# Patient Record
Sex: Female | Born: 1975 | Hispanic: Yes | Marital: Married | Smoking: Former smoker
Health system: Southern US, Community
[De-identification: ages and names within clinical notes are randomized; demographics above are authoritative.]

## PROBLEM LIST (undated history)

## (undated) DIAGNOSIS — R1310 Dysphagia, unspecified: Secondary | ICD-10-CM

## (undated) DIAGNOSIS — R600 Localized edema: Secondary | ICD-10-CM

## (undated) DIAGNOSIS — F32A Depression, unspecified: Secondary | ICD-10-CM

## (undated) DIAGNOSIS — R609 Edema, unspecified: Secondary | ICD-10-CM

## (undated) DIAGNOSIS — M2550 Pain in unspecified joint: Secondary | ICD-10-CM

## (undated) DIAGNOSIS — R0600 Dyspnea, unspecified: Secondary | ICD-10-CM

## (undated) DIAGNOSIS — F4310 Post-traumatic stress disorder, unspecified: Secondary | ICD-10-CM

## (undated) DIAGNOSIS — R7303 Prediabetes: Secondary | ICD-10-CM

## (undated) DIAGNOSIS — M549 Dorsalgia, unspecified: Secondary | ICD-10-CM

## (undated) DIAGNOSIS — J45909 Unspecified asthma, uncomplicated: Secondary | ICD-10-CM

## (undated) DIAGNOSIS — K219 Gastro-esophageal reflux disease without esophagitis: Secondary | ICD-10-CM

## (undated) DIAGNOSIS — K5900 Constipation, unspecified: Secondary | ICD-10-CM

## (undated) DIAGNOSIS — F419 Anxiety disorder, unspecified: Secondary | ICD-10-CM

## (undated) DIAGNOSIS — C50919 Malignant neoplasm of unspecified site of unspecified female breast: Secondary | ICD-10-CM

## (undated) DIAGNOSIS — Z923 Personal history of irradiation: Secondary | ICD-10-CM

## (undated) DIAGNOSIS — E119 Type 2 diabetes mellitus without complications: Secondary | ICD-10-CM

## (undated) DIAGNOSIS — R0602 Shortness of breath: Secondary | ICD-10-CM

## (undated) HISTORY — DX: Shortness of breath: R06.02

## (undated) HISTORY — DX: Pain in unspecified joint: M25.50

## (undated) HISTORY — DX: Prediabetes: R73.03

## (undated) HISTORY — DX: Unspecified asthma, uncomplicated: J45.909

## (undated) HISTORY — DX: Dysphagia, unspecified: R13.10

## (undated) HISTORY — DX: Constipation, unspecified: K59.00

## (undated) HISTORY — DX: Anxiety disorder, unspecified: F41.9

## (undated) HISTORY — DX: Edema, unspecified: R60.9

## (undated) HISTORY — DX: Malignant neoplasm of unspecified site of unspecified female breast: C50.919

## (undated) HISTORY — DX: Dorsalgia, unspecified: M54.9

## (undated) HISTORY — DX: Morbid (severe) obesity due to excess calories: E66.01

## (undated) HISTORY — DX: Depression, unspecified: F32.A

## (undated) HISTORY — DX: Localized edema: R60.0

## (undated) HISTORY — DX: Personal history of irradiation: Z92.3

---

## 2015-04-24 ENCOUNTER — Other Ambulatory Visit (HOSPITAL_COMMUNITY)
Admission: RE | Admit: 2015-04-24 | Discharge: 2015-04-24 | Disposition: A | Discharge status: Home / Self Care | Payer: 59 | Source: Ambulatory Visit | Attending: Obstetrics & Gynecology | Admitting: Obstetrics & Gynecology

## 2015-04-24 DIAGNOSIS — Z1151 Encounter for screening for human papillomavirus (HPV): Secondary | ICD-10-CM | POA: Insufficient documentation

## 2015-04-24 DIAGNOSIS — Z01419 Encounter for gynecological examination (general) (routine) without abnormal findings: Secondary | ICD-10-CM | POA: Insufficient documentation

## 2015-11-10 DIAGNOSIS — R002 Palpitations: Secondary | ICD-10-CM

## 2015-11-10 HISTORY — DX: Palpitations: R00.2

## 2015-11-18 MED FILL — SYMBICORT 160-4.5 MCG INH: 160-4.5 | 30 days supply | Qty: 10 | Fill #3

## 2015-11-18 MED FILL — MONTELUKAST SOD 10 MG TAB: 10 | 30 days supply | Qty: 30 | Fill #5

## 2015-11-18 MED FILL — DULoxetine HCL 30 MG CPEP: 30 | 30 days supply | Qty: 90 | Fill #1

## 2015-11-29 DIAGNOSIS — F411 Generalized anxiety disorder: Secondary | ICD-10-CM | POA: Diagnosis not present

## 2015-12-03 DIAGNOSIS — R002 Palpitations: Secondary | ICD-10-CM | POA: Diagnosis not present

## 2015-12-10 MED FILL — busPIRone HCL 30 MG TABS: 30 | 30 days supply | Qty: 60 | Fill #0

## 2015-12-17 MED FILL — DULoxetine HCL 30 MG CPEP: 30 | 30 days supply | Qty: 90 | Fill #2

## 2015-12-25 ENCOUNTER — Ambulatory Visit: Payer: 59 | Admitting: Internal Medicine

## 2016-01-09 ENCOUNTER — Ambulatory Visit (INDEPENDENT_AMBULATORY_CARE_PROVIDER_SITE_OTHER): Payer: 59 | Admitting: Internal Medicine

## 2016-01-09 ENCOUNTER — Encounter: Payer: Self-pay | Admitting: Internal Medicine

## 2016-01-09 VITALS — BP 132/86 | HR 99 | Ht 59.5 in | Wt 207.7 lb

## 2016-01-09 DIAGNOSIS — R002 Palpitations: Secondary | ICD-10-CM | POA: Insufficient documentation

## 2016-01-09 DIAGNOSIS — R0602 Shortness of breath: Secondary | ICD-10-CM | POA: Diagnosis not present

## 2016-01-09 DIAGNOSIS — F419 Anxiety disorder, unspecified: Secondary | ICD-10-CM

## 2016-01-09 DIAGNOSIS — J45909 Unspecified asthma, uncomplicated: Secondary | ICD-10-CM | POA: Insufficient documentation

## 2016-01-09 DIAGNOSIS — R0683 Snoring: Secondary | ICD-10-CM

## 2016-01-09 DIAGNOSIS — J4530 Mild persistent asthma, uncomplicated: Secondary | ICD-10-CM

## 2016-01-09 NOTE — Patient Instructions (Signed)
Medication Instructions:  Your physician recommends that you continue on your current medications as directed. Please refer to the Current Medication list given to you today.   Labwork: None ordered  Testing/Procedures: Your physician has requested that you have an echocardiogram. Echocardiography is a painless test that uses sound waves to create images of your heart. It provides your doctor with information about the size and shape of your heart and how well your heart's chambers and valves are working. This procedure takes approximately one hour. There are no restrictions for this procedure.  Your physician has recommended that you have a sleep study. This test records several body functions during sleep, including: brain activity, eye movement, oxygen and carbon dioxide blood levels, heart rate and rhythm, breathing rate and rhythm, the flow of air through your mouth and nose, snoring, body muscle movements, and chest and belly movement.  Your physician has recommended that you wear an event monitor. Event monitors are medical devices that record the heart's electrical activity. Doctors most often [place] these monitors to diagnose arrhythmias. Arrhythmias are problems with the speed or rhythm of the heartbeat. The monitor is a small, portable device. You can wear one while you do your normal daily activities. This is usually used to diagnose what is causing palpitations/syncope (passing out).    Follow-Up: Your physician recommends that you schedule a follow-up appointment in 2-4 weeks   Any Other Special Instructions Will Be Listed Below (If Applicable).     If you need a refill on your cardiac medications before your next appointment, please call your pharmacy.

## 2016-01-09 NOTE — Progress Notes (Signed)
OFFICE NOTE  Chief Complaint:  Palpitations, anxiety, DOE, weight gain  Primary Care Physician: Wenda Low, MD  HPI:  Jennifer Davis is a pleasant 40 year old female who recently removed to [facility]. She is originally from the [place] area. She was living in [place] for a year and then moved to [place]. Her past history significant for asthma as well as ongoing anxiety. She has no known cardiac problems. She does have an aunt who has WPW but no one in the family with any significant heart history. 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 She presents for recent onset of palpitations and chest tightness. She's had palpitations in the distant past and had an EKG but has never worn a monitor. She also reports some chest tightness related to wheezing and her asthma but has had some aching in both arms as well as numbness and tingling in her fingers. Recently she's had rapid "weight gain". She also gets short of breath and some dizziness when she exerts her self. She told me that she was lifting a small piece of furniture with her husband up the stairs and became physically exhausted and short of breath. Her weight gain recently has pushed her up into the morbid obese category and a height of 4 foot 11 and weight of 207 pounds. EKG today is normal sinus rhythm without ischemic changes at 99.  PMHx:  Past Medical History  Diagnosis Date  . Asthma   . Anxiety     Past Surgical History  Procedure Laterality Date  . Cesarean section      x3    FAMHx:  Family History  Problem Relation Age of Onset  . Hypertension Mother   . Fibromyalgia Mother   . Asthma Sister   . Yves Dill Parkinson White syndrome Maternal Aunt   . Hypertension Maternal Grandmother   . Hypertension Maternal Grandfather   . Cancer - Lung Maternal Grandfather   . Stroke Maternal Grandfather     SOCHx:   reports that she quit  smoking about 4 years ago. Her smoking use included Cigarettes. She does not have any smokeless tobacco history on file. Her alcohol and drug histories are not on file.  ALLERGIES:  No Known Allergies  ROS: Pertinent items noted in HPI and remainder of comprehensive ROS otherwise negative.  HOME MEDS: Current Outpatient Prescriptions  Medication Sig Dispense Refill  . busPIRone (BUSPAR) 30 MG tablet Take 0.5 tablets by mouth 2 (two) times daily.  2  . DULoxetine (CYMBALTA) 30 MG capsule Take 90 mg by mouth daily.  2  . montelukast (SINGULAIR) 10 MG tablet Take 1 tablet by mouth daily.  5  . Multiple Vitamins-Minerals (MULTIVITAMIN PO) Take 2 tablets by mouth daily. Skin/hair/nail supplement.    Marland Kitchen PROAIR HFA 108 (90 Base) MCG/ACT inhaler Inhale 2 puffs into the lungs as needed.  0  . SYMBICORT 160-4.5 MCG/ACT inhaler Inhale 2 puffs into the lungs 2 (two) times daily.  5   No current facility-administered medications for this visit.    LABS/IMAGING: No results found for this or any previous visit (from the past 48 hour(s)). No results found.  WEIGHTS: Wt Readings from Last 3 Encounters:  01/09/16 207 lb 11.2 oz (94.212 kg)    VITALS: BP 132/86 mmHg  Pulse 99  Ht 4' 11.5" (1.511 m)  Wt 207 lb 11.2 oz (94.212 kg)  BMI 41.26 kg/m2  EXAM: General  appearance: alert, no distress and morbidly obese Neck: no carotid bruit and no JVD Lungs: clear to auscultation bilaterally Heart: regular rate and rhythm, S1, S2 normal, no murmur, click, rub or gallop Abdomen: soft, non-tender; bowel sounds normal; no masses,  no organomegaly Extremities: extremities normal, atraumatic, no cyanosis or edema Pulses: 2+ and symmetric Skin: Skin color, texture, turgor normal. No rashes or lesions Neurologic: Grossly normal Psych: Mildly anxious  EKG: Sinus rhythm at 99  ASSESSMENT: 1. Palpitations 2. Dyspnea on exertion 3. Recent significant weight gain 4. Anxiety 5. Asthma  PLAN: 1.    Mrs. Rumpf had recent weight gain and significant shortness of breath on exertion. I suspect the 2 are tightly related. She's also had palpitations which could be related to anxiety. She's been working with a psychiatrist to get her anxiety under control. She's never worn a monitor and I think would be helpful to see if any of these events are related to arrhythmias. We'll place a one-week monitor. We'll also get an echocardiogram as she's had worsening shortness of breath to rule out cardiomyopathy or significant valvular disorder. Plan to see her back to discuss those findings in a few weeks.  Thanks again for the kind referral.  Pixie Casino, MD, [facility] Attending Cardiologist [place] 01/09/2016, 6:09 PM

## 2016-01-10 ENCOUNTER — Encounter: Payer: Self-pay | Admitting: *Deleted

## 2016-01-15 MED FILL — MONTELUKAST SOD 10 MG TAB: 10 | 30 days supply | Qty: 30 | Fill #0

## 2016-01-21 MED FILL — busPIRone HCL 30 MG TABS: 30 | 30 days supply | Qty: 60 | Fill #1

## 2016-01-21 MED FILL — DULoxetine HCL 30 MG CPEP: 30 | 30 days supply | Qty: 90 | Fill #0

## 2016-01-27 ENCOUNTER — Other Ambulatory Visit: Payer: Self-pay

## 2016-01-27 ENCOUNTER — Other Ambulatory Visit (HOSPITAL_COMMUNITY): Payer: Self-pay | Admitting: *Deleted

## 2016-01-27 ENCOUNTER — Ambulatory Visit (INDEPENDENT_AMBULATORY_CARE_PROVIDER_SITE_OTHER): Payer: 59

## 2016-01-27 ENCOUNTER — Ambulatory Visit (HOSPITAL_COMMUNITY): Payer: 59 | Attending: Cardiovascular Disease

## 2016-01-27 DIAGNOSIS — R0600 Dyspnea, unspecified: Secondary | ICD-10-CM | POA: Diagnosis present

## 2016-01-27 DIAGNOSIS — I340 Nonrheumatic mitral (valve) insufficiency: Secondary | ICD-10-CM | POA: Diagnosis not present

## 2016-01-27 DIAGNOSIS — R0602 Shortness of breath: Secondary | ICD-10-CM

## 2016-01-27 DIAGNOSIS — I071 Rheumatic tricuspid insufficiency: Secondary | ICD-10-CM | POA: Insufficient documentation

## 2016-01-27 DIAGNOSIS — Z87891 Personal history of nicotine dependence: Secondary | ICD-10-CM | POA: Diagnosis not present

## 2016-01-27 DIAGNOSIS — R002 Palpitations: Secondary | ICD-10-CM | POA: Diagnosis not present

## 2016-01-27 DIAGNOSIS — Z6841 Body Mass Index (BMI) 40.0 and over, adult: Secondary | ICD-10-CM | POA: Insufficient documentation

## 2016-01-27 MED ORDER — PERFLUTREN LIPID MICROSPHERE
1.0000 mL | INTRAVENOUS | Status: AC | PRN
  Administered 2016-01-27: 1 mL via INTRAVENOUS

## 2016-01-30 MED FILL — SYMBICORT 160-4.5 MCG INH: 160-4.5 | 30 days supply | Qty: 10 | Fill #4

## 2016-01-30 MED FILL — PROAIR HFA 90 MCG INHALER: 108 (90 BAS | 16 days supply | Qty: 9 | Fill #0

## 2016-02-10 DIAGNOSIS — S60562A Insect bite (nonvenomous) of left hand, initial encounter: Secondary | ICD-10-CM | POA: Diagnosis not present

## 2016-02-18 ENCOUNTER — Encounter: Payer: Self-pay | Admitting: Internal Medicine

## 2016-02-18 ENCOUNTER — Ambulatory Visit (INDEPENDENT_AMBULATORY_CARE_PROVIDER_SITE_OTHER): Payer: 59 | Admitting: Internal Medicine

## 2016-02-18 VITALS — BP 124/60 | HR 93 | Ht 59.0 in | Wt 206.0 lb

## 2016-02-18 DIAGNOSIS — F419 Anxiety disorder, unspecified: Secondary | ICD-10-CM

## 2016-02-18 DIAGNOSIS — R002 Palpitations: Secondary | ICD-10-CM | POA: Diagnosis not present

## 2016-02-18 DIAGNOSIS — R0602 Shortness of breath: Secondary | ICD-10-CM

## 2016-02-18 NOTE — Progress Notes (Signed)
OFFICE NOTE  Chief Complaint:  Follow-up studies  Primary Care Physician: Wenda Low, MD  HPI:  Jennifer Davis is a pleasant 40 year old female who recently removed to [facility]. She is originally from the [place] area. She was living in [place] for a year and then moved to [place]. Her past history significant for asthma as well as ongoing anxiety. She has no known cardiac problems. She does have an aunt who has WPW but no one in the family with any significant heart history. 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 She presents for recent onset of palpitations and chest tightness. She's had palpitations in the distant past and had an EKG but has never worn a monitor. She also reports some chest tightness related to wheezing and her asthma but has had some aching in both arms as well as numbness and tingling in her fingers. Recently she's had rapid "weight gain". She also gets short of breath and some dizziness when she exerts her self. She told me that she was lifting a small piece of furniture with her husband up the stairs and became physically exhausted and short of breath. Her weight gain recently has pushed her up into the morbid obese category and a height of 4 foot 11 and weight of 207 pounds. EKG today is normal sinus rhythm without ischemic changes at 99.  Mrs. Petros returns today for follow-up. She reports her palpitations have improved somewhat with decreasing her caffeine intake. I had ordered a one-week monitor however she has been on the monitor for just short of one month. The monitor is only demonstrated sinus rhythm and sinus tachycardia. She also had an echocardiogram which is essentially normal demonstrated normal systolic and diastolic function and trivial MR and TR. This is very reassuring.  PMHx:  Past Medical History  Diagnosis Date  . Asthma   . Anxiety     Past Surgical  History  Procedure Laterality Date  . Cesarean section      x3    FAMHx:  Family History  Problem Relation Age of Onset  . Hypertension Mother   . Fibromyalgia Mother   . Asthma Sister     x2  . Yves Dill Parkinson White syndrome Maternal Aunt 67  . Hypertension Maternal Grandmother   . Hypertension Maternal Grandfather   . Cancer - Lung Maternal Grandfather   . Stroke Maternal Grandfather     SOCHx:   reports that she quit smoking about 4 years ago. Her smoking use included Cigarettes. She does not have any smokeless tobacco history on file. Her alcohol and drug histories are not on file.  ALLERGIES:  No Known Allergies  ROS: Pertinent items noted in HPI and remainder of comprehensive ROS otherwise negative.  HOME MEDS: Current Outpatient Prescriptions  Medication Sig Dispense Refill  . busPIRone (BUSPAR) 30 MG tablet Take 0.5 tablets by mouth 2 (two) times daily.  2  . DULoxetine (CYMBALTA) 30 MG capsule Take 90 mg by mouth daily.  2  . montelukast (SINGULAIR) 10 MG tablet Take 1 tablet by mouth daily.  5  . Multiple Vitamins-Minerals (MULTIVITAMIN PO) Take 2 tablets by mouth daily. Skin/hair/nail supplement.    Marland Kitchen PROAIR HFA 108 (90 Base) MCG/ACT inhaler Inhale 2 puffs into the lungs as needed.  0  . SYMBICORT 160-4.5 MCG/ACT inhaler Inhale 2 puffs into the lungs 2 (two) times daily.  5   No current  facility-administered medications for this visit.    LABS/IMAGING: No results found for this or any previous visit (from the past 48 hour(s)). No results found.  WEIGHTS: Wt Readings from Last 3 Encounters:  02/18/16 206 lb (93.441 kg)  01/09/16 207 lb 11.2 oz (94.212 kg)    VITALS: BP 124/60 mmHg  Pulse 93  Ht 4\' 11"  (1.499 m)  Wt 206 lb (93.441 kg)  BMI 41.58 kg/m2  EXAM: Deferred  EKG: Deferred  ASSESSMENT: 1. Palpitations - no evidence of extrasystoles or arrhythmias after 3 weeks of monitoring 2. Dyspnea on exertion - normal echo findings 3. Recent  significant weight gain 4. Anxiety 5. Asthma  PLAN: 1.   Mrs. Lecompte has had shortness of breath with recent weight gain which I suspect is due to deconditioning. Cardiovascularly, I cannot find any current issues. Her monitor failed to show any significant arrhythmias. We will discontinue that monitor today as it was only ordered for 1 week. Follow-up as needed.  Pixie Casino, MD, [facility] Attending Cardiologist [place] [facility] [place] 02/18/2016, 9:06 AM

## 2016-02-18 NOTE — Patient Instructions (Signed)
Your physician recommends that you schedule a follow-up appointment as needed  

## 2016-02-20 ENCOUNTER — Ambulatory Visit (HOSPITAL_BASED_OUTPATIENT_CLINIC_OR_DEPARTMENT_OTHER): Payer: 59 | Attending: Internal Medicine | Admitting: *Deleted

## 2016-02-20 DIAGNOSIS — G4733 Obstructive sleep apnea (adult) (pediatric): Secondary | ICD-10-CM | POA: Insufficient documentation

## 2016-02-20 DIAGNOSIS — Z79899 Other long term (current) drug therapy: Secondary | ICD-10-CM | POA: Insufficient documentation

## 2016-02-20 DIAGNOSIS — R0683 Snoring: Secondary | ICD-10-CM | POA: Insufficient documentation

## 2016-02-20 DIAGNOSIS — F329 Major depressive disorder, single episode, unspecified: Secondary | ICD-10-CM | POA: Diagnosis not present

## 2016-02-21 DIAGNOSIS — H5213 Myopia, bilateral: Secondary | ICD-10-CM | POA: Diagnosis not present

## 2016-02-25 MED FILL — DULoxetine HCL 30 MG CPEP: 30 | 30 days supply | Qty: 90 | Fill #1

## 2016-03-08 NOTE — Progress Notes (Signed)
Patient ID: Jennifer Davis, female   DOB: 02/21/1976, 40 y.o.   MRN: [id number]        Patient Name: Jennifer Davis, Jennifer Davis Date: 02/20/2016 Gender: Female D.O.B: Apr 20, 1976 Age (years): 39 Referring Provider: Nadean Corwin Hilty Height (inches): 60 Interpreting Physician: Shelva Majestic MD, ABSM Weight (lbs): 200 RPSGT: Gerhard Perches BMI: 39 MRN: [id number] Neck Size: 14.50  CLINICAL INFORMATION Sleep Study Type: NPSG   Indication for sleep study: OSA, Snoring   Epworth Sleepiness Score: 7   SLEEP STUDY TECHNIQUE As per the AASM Manual for the Scoring of Sleep and Associated Events v2.3 (April 2016) with a hypopnea requiring 4% desaturations. The channels recorded and monitored were frontal, central and occipital EEG, electrooculogram (EOG), submentalis EMG (chin), nasal and oral airflow, thoracic and abdominal wall motion, anterior tibialis EMG, snore microphone, electrocardiogram, and pulse oximetry.  MEDICATIONS  busPIRone (BUSPAR) 30 MG tablet 0.5 tablet, 2 times daily     Note: Received from: External Pharmacy Received Sig: (Written 01/09/2016 1431)   DULoxetine (CYMBALTA) 30 MG capsule 90 mg, Daily     Note: Received from: External Pharmacy Received Sig: (Written 01/09/2016 1431)   montelukast (SINGULAIR) 10 MG tablet 1 tablet, Daily     Note: Received from: External Pharmacy Received Sig: (Written 01/09/2016 1431)   Multiple Vitamins-Minerals (MULTIVITAMIN PO) 2 tablet, Daily     PROAIR HFA 108 (90 Base) MCG/ACT inhaler 2 puff, As needed     Note: Received from: External Pharmacy Received Sig: (Written 01/09/2016 1431)   SYMBICORT 160-4.5 MCG/ACT inhaler 2 puff, 2 times daily     Note: Received from: External Pharmacy     Medications self-administered by patient during sleep study : No sleep medicine administered.  SLEEP ARCHITECTURE The study was initiated at 10:24:24 PM and ended at 4:53:21 AM. Sleep onset time was 50.3 minutes and the sleep efficiency was 75.4%. The total sleep  time was 293.2 minutes. Wake after sleep onset (WASO) was 45.5 minutes Stage REM latency was N/A minutes. The patient spent 10.92% of the night in stage N1 sleep, 88.40% in stage N2 sleep, 0.68% in stage N3 and 0.00% in REM. Alpha intrusion was absent. Supine sleep was 72.14%.  RESPIRATORY PARAMETERS The overall apnea/hypopnea index (AHI) was 0.0 per hour. There were 0 total apneas, including 0 obstructive, 0 central and 0 mixed apneas. There were 0 hypopneas and 0 RERAs. The AHI during Stage REM sleep was N/A per hour. AHI while supine was 0.0 per hour. The mean oxygen saturation was 94.50%. The minimum SpO2 during sleep was 91.00%. Soft snoring was noted during this study.  CARDIAC DATA The 2 lead EKG demonstrated sinus rhythm. The mean heart rate was 80.31 beats per minute. Other EKG findings include: None.  LEG MOVEMENT DATA The total PLMS were 4 with a resulting PLMS index of 0.82. Associated arousal with leg movement index was 0.0 .  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 - No significant central sleep apnea occurred during this study (CAI = 0.0/h). - No significant oxygen desaturation  (Min O2 = 91.00%). - Reduced sleep efficiency. - Abnormal sleep architecture with reduction in slow wave sleep and absence of REM sleep. - The absence of REM sleep may be contributed by the patient's medicine for depression. - The arousal index was mildly abnormal. - Prolonged latency to sleep initiation. - Soft snoring volume. - No cardiac abnormalities were noted during this study. -  Clinically significant periodic limb movements did not occur during sleep. No significant associated arousals.  DIAGNOSIS - Snoring - Abnormal sleep architecture  RECOMMENDATIONS - At present there is no indication for CPAP therapy; however, sleep disordered breathing may be underestimated with the absence of REM  sleep on this study. - Avoid alcohol, sedatives and other CNS depressants that may worsen sleep apnea and disrupt normal sleep architecture. - Sleep hygiene should be reviewed to assess factors that may improve sleep quality. - Weight management (BMI 39) and regular exercise should be initiated.   Jennifer Sine, MD, [place], American Board of Sleep Medicine  ELECTRONICALLY SIGNED ON:  03/08/2016, 1:16 PM [place] PH: [phone]   FX: [phone] [place]

## 2016-03-09 DIAGNOSIS — H40003 Preglaucoma, unspecified, bilateral: Secondary | ICD-10-CM | POA: Diagnosis not present

## 2016-03-09 DIAGNOSIS — H3554 Dystrophies primarily involving the retinal pigment epithelium: Secondary | ICD-10-CM | POA: Diagnosis not present

## 2016-03-11 MED FILL — busPIRone HCL 30 MG TABS: 30 | 30 days supply | Qty: 60 | Fill #2

## 2016-03-23 MED FILL — DULoxetine HCL 30 MG CPEP: 30 | 30 days supply | Qty: 90 | Fill #2

## 2016-03-23 MED FILL — MONTELUKAST SOD 10 MG TAB: 10 | 30 days supply | Qty: 30 | Fill #1

## 2016-03-25 DIAGNOSIS — F411 Generalized anxiety disorder: Secondary | ICD-10-CM | POA: Diagnosis not present

## 2016-04-22 DIAGNOSIS — F411 Generalized anxiety disorder: Secondary | ICD-10-CM | POA: Diagnosis not present

## 2016-04-22 MED FILL — DULoxetine HCL 60 MG CPEP: 60 | 30 days supply | Qty: 60 | Fill #0

## 2016-04-22 MED FILL — busPIRone HCL 30 MG TABS: 30 | 30 days supply | Qty: 60 | Fill #0

## 2016-05-27 MED FILL — SYMBICORT 160-4.5 MCG INH: 160-4.5 | 30 days supply | Qty: 10 | Fill #0

## 2016-05-27 MED FILL — MONTELUKAST SOD 10 MG TAB: 10 | 30 days supply | Qty: 30 | Fill #2

## 2016-05-27 MED FILL — PROAIR HFA 90 MCG INHALER: 108 (90 BAS | 25 days supply | Qty: 9 | Fill #0

## 2016-05-27 MED FILL — busPIRone HCL 30 MG TABS: 30 | 30 days supply | Qty: 60 | Fill #1

## 2016-05-27 MED FILL — DULoxetine HCL 60 MG CPEP: 60 | 30 days supply | Qty: 60 | Fill #1

## 2016-07-01 MED FILL — DULoxetine HCL 60 MG CPEP: 60 | 30 days supply | Qty: 60 | Fill #0

## 2016-07-15 DIAGNOSIS — F411 Generalized anxiety disorder: Secondary | ICD-10-CM | POA: Diagnosis not present

## 2016-08-04 MED FILL — MONTELUKAST SOD 10 MG TAB: 10 | 30 days supply | Qty: 30 | Fill #3

## 2016-08-04 MED FILL — DULoxetine HCL 60 MG CPEP: 60 | 30 days supply | Qty: 60 | Fill #1

## 2016-08-26 DIAGNOSIS — F411 Generalized anxiety disorder: Secondary | ICD-10-CM | POA: Diagnosis not present

## 2016-09-07 MED FILL — MONTELUKAST SOD 10 MG TAB: 10 | 30 days supply | Qty: 30 | Fill #0

## 2016-09-07 MED FILL — SYMBICORT 160-4.5 MCG INH: 160-4.5 | 30 days supply | Qty: 10 | Fill #0

## 2016-09-07 MED FILL — DULoxetine HCL 60 MG CPEP: 60 | 30 days supply | Qty: 60 | Fill #2

## 2016-09-22 MED FILL — busPIRone HCL 30 MG TABS: 30 | 30 days supply | Qty: 60 | Fill #0

## 2016-10-07 DIAGNOSIS — F411 Generalized anxiety disorder: Secondary | ICD-10-CM | POA: Diagnosis not present

## 2016-10-14 MED FILL — DULoxetine HCL 60 MG CPEP: 60 | 30 days supply | Qty: 60 | Fill #0

## 2016-10-22 DIAGNOSIS — J3089 Other allergic rhinitis: Secondary | ICD-10-CM | POA: Diagnosis not present

## 2016-10-22 DIAGNOSIS — J3081 Allergic rhinitis due to animal (cat) (dog) hair and dander: Secondary | ICD-10-CM | POA: Diagnosis not present

## 2016-10-22 DIAGNOSIS — J4540 Moderate persistent asthma, uncomplicated: Secondary | ICD-10-CM | POA: Diagnosis not present

## 2016-10-22 DIAGNOSIS — J301 Allergic rhinitis due to pollen: Secondary | ICD-10-CM | POA: Diagnosis not present

## 2016-10-27 MED FILL — PROAIR HFA 90 MCG INHALER: 108 (90 BAS | 25 days supply | Qty: 9 | Fill #0

## 2016-11-17 MED FILL — DULoxetine HCL 60 MG CPEP: 60 | 30 days supply | Qty: 60 | Fill #1

## 2016-11-17 MED FILL — busPIRone HCL 30 MG TABS: 30 | 30 days supply | Qty: 60 | Fill #1

## 2016-11-17 MED FILL — MONTELUKAST SOD 10 MG TAB: 10 | 30 days supply | Qty: 30 | Fill #1

## 2016-12-07 DIAGNOSIS — F411 Generalized anxiety disorder: Secondary | ICD-10-CM | POA: Diagnosis not present

## 2016-12-28 MED FILL — DULoxetine HCL 60 MG CPEP: 60 | 30 days supply | Qty: 60 | Fill #0

## 2017-01-20 MED FILL — SYMBICORT 160-4.5 MCG INH: 160-4.5 | 30 days supply | Qty: 10 | Fill #1

## 2017-01-20 MED FILL — busPIRone HCL 30 MG TABS: 30 | 30 days supply | Qty: 60 | Fill #0

## 2017-01-21 MED FILL — MONTELUKAST SOD 10 MG TAB: 10 | 30 days supply | Qty: 30 | Fill #0

## 2017-01-27 MED FILL — DULoxetine HCL 60 MG CPEP: 60 | 30 days supply | Qty: 60 | Fill #1

## 2017-02-12 DIAGNOSIS — S0922XA Traumatic rupture of left ear drum, initial encounter: Secondary | ICD-10-CM | POA: Diagnosis not present

## 2017-02-18 MED FILL — PROAIR HFA 90 MCG INHALER: 108 (90 BAS | 25 days supply | Qty: 9 | Fill #0

## 2017-02-26 DIAGNOSIS — H5213 Myopia, bilateral: Secondary | ICD-10-CM | POA: Diagnosis not present

## 2017-03-01 DIAGNOSIS — F411 Generalized anxiety disorder: Secondary | ICD-10-CM | POA: Diagnosis not present

## 2017-03-09 MED FILL — MONTELUKAST SOD 10 MG TAB: 10 | 30 days supply | Qty: 30 | Fill #1

## 2017-03-09 MED FILL — DULoxetine HCL 60 MG CPEP: 60 | 30 days supply | Qty: 60 | Fill #2

## 2017-03-11 DIAGNOSIS — L659 Nonscarring hair loss, unspecified: Secondary | ICD-10-CM | POA: Diagnosis not present

## 2017-03-11 DIAGNOSIS — F419 Anxiety disorder, unspecified: Secondary | ICD-10-CM | POA: Diagnosis not present

## 2017-04-01 MED FILL — busPIRone HCL 30 MG TABS: 30 | 30 days supply | Qty: 60 | Fill #1

## 2017-04-01 MED FILL — SYMBICORT 160-4.5 MCG INH: 160-4.5 | 30 days supply | Qty: 10 | Fill #2

## 2017-04-16 MED FILL — DULoxetine HCL 60 MG CPEP: 60 | 30 days supply | Qty: 60 | Fill #0

## 2017-05-03 DIAGNOSIS — F411 Generalized anxiety disorder: Secondary | ICD-10-CM | POA: Diagnosis not present

## 2017-05-19 DIAGNOSIS — L659 Nonscarring hair loss, unspecified: Secondary | ICD-10-CM | POA: Diagnosis not present

## 2017-05-19 MED FILL — PROAIR HFA 90 MCG INHALER: 108 (90 BAS | 20 days supply | Qty: 9 | Fill #0

## 2017-05-20 MED FILL — DULoxetine HCL 60 MG CPEP: 60 | 30 days supply | Qty: 60 | Fill #1

## 2017-05-20 MED FILL — MONTELUKAST SOD 10 MG TAB: 10 | 30 days supply | Qty: 30 | Fill #2

## 2017-05-20 MED FILL — busPIRone HCL 30 MG TABS: 30 | 30 days supply | Qty: 60 | Fill #2

## 2017-06-09 DIAGNOSIS — N946 Dysmenorrhea, unspecified: Secondary | ICD-10-CM | POA: Diagnosis not present

## 2017-06-09 DIAGNOSIS — Z6841 Body Mass Index (BMI) 40.0 and over, adult: Secondary | ICD-10-CM | POA: Diagnosis not present

## 2017-06-09 DIAGNOSIS — Z01419 Encounter for gynecological examination (general) (routine) without abnormal findings: Secondary | ICD-10-CM | POA: Diagnosis not present

## 2017-06-10 DIAGNOSIS — Z6841 Body Mass Index (BMI) 40.0 and over, adult: Secondary | ICD-10-CM | POA: Diagnosis not present

## 2017-06-10 DIAGNOSIS — E7800 Pure hypercholesterolemia, unspecified: Secondary | ICD-10-CM | POA: Diagnosis not present

## 2017-06-10 DIAGNOSIS — J309 Allergic rhinitis, unspecified: Secondary | ICD-10-CM | POA: Diagnosis not present

## 2017-06-10 DIAGNOSIS — J45909 Unspecified asthma, uncomplicated: Secondary | ICD-10-CM | POA: Diagnosis not present

## 2017-06-10 DIAGNOSIS — F411 Generalized anxiety disorder: Secondary | ICD-10-CM | POA: Diagnosis not present

## 2017-06-10 DIAGNOSIS — Z1389 Encounter for screening for other disorder: Secondary | ICD-10-CM | POA: Diagnosis not present

## 2017-06-10 DIAGNOSIS — Z0000 Encounter for general adult medical examination without abnormal findings: Secondary | ICD-10-CM | POA: Diagnosis not present

## 2017-07-08 MED FILL — DULoxetine HCL 60 MG CPEP: 60 | 30 days supply | Qty: 60 | Fill #2

## 2017-07-20 MED FILL — SYMBICORT 160-4.5 MCG INH: 160-4.5 | 30 days supply | Qty: 10 | Fill #3

## 2017-07-26 DIAGNOSIS — F411 Generalized anxiety disorder: Secondary | ICD-10-CM | POA: Diagnosis not present

## 2017-08-16 MED FILL — MONTELUKAST SOD 10 MG TAB: 10 | 30 days supply | Qty: 30 | Fill #3

## 2017-08-16 MED FILL — DULoxetine HCL 60 MG CPEP: 60 | 30 days supply | Qty: 60 | Fill #3

## 2017-08-17 MED FILL — busPIRone HCL 30 MG TABS: 30 | 30 days supply | Qty: 60 | Fill #0

## 2017-09-06 MED FILL — PROAIR HFA 90 MCG INHALER: 108 (90 BAS | 17 days supply | Qty: 9 | Fill #0

## 2017-09-16 DIAGNOSIS — J45909 Unspecified asthma, uncomplicated: Secondary | ICD-10-CM | POA: Diagnosis not present

## 2017-09-16 DIAGNOSIS — J069 Acute upper respiratory infection, unspecified: Secondary | ICD-10-CM | POA: Diagnosis not present

## 2017-09-16 MED FILL — AZITHROMYCIN 250 MG TABLET: 250 | 5 days supply | Qty: 6 | Fill #0

## 2017-09-16 MED FILL — predniSONE 20 MG TABS: 20 | 5 days supply | Qty: 10 | Fill #0

## 2017-09-16 MED FILL — ALBUTEROL 0.083% INHAL SOLN: (2.5 MG/3ML | 8 days supply | Qty: 75 | Fill #0

## 2017-09-28 MED FILL — DULoxetine HCL 60 MG CPEP: 60 | 30 days supply | Qty: 60 | Fill #0

## 2017-10-14 MED FILL — SYMBICORT 160-4.5 MCG INH: 160-4.5 | 30 days supply | Qty: 10 | Fill #0

## 2017-10-21 DIAGNOSIS — J4540 Moderate persistent asthma, uncomplicated: Secondary | ICD-10-CM | POA: Diagnosis not present

## 2017-10-21 DIAGNOSIS — J3081 Allergic rhinitis due to animal (cat) (dog) hair and dander: Secondary | ICD-10-CM | POA: Diagnosis not present

## 2017-10-21 DIAGNOSIS — J3089 Other allergic rhinitis: Secondary | ICD-10-CM | POA: Diagnosis not present

## 2017-10-21 DIAGNOSIS — J301 Allergic rhinitis due to pollen: Secondary | ICD-10-CM | POA: Diagnosis not present

## 2017-10-21 MED FILL — busPIRone HCL 30 MG TABS: 30 | 30 days supply | Qty: 60 | Fill #0

## 2017-10-22 MED FILL — DULoxetine HCL 60 MG CPEP: 60 | 30 days supply | Qty: 60 | Fill #0

## 2017-10-26 MED FILL — MONTELUKAST SOD 10 MG TAB: 10 | 30 days supply | Qty: 30 | Fill #4

## 2017-11-17 DIAGNOSIS — F411 Generalized anxiety disorder: Secondary | ICD-10-CM | POA: Diagnosis not present

## 2017-12-13 MED FILL — MONTELUKAST SOD 10 MG TAB: 10 | 30 days supply | Qty: 30 | Fill #5

## 2017-12-13 MED FILL — DULoxetine HCL 60 MG CPEP: 60 | 30 days supply | Qty: 60 | Fill #1

## 2017-12-14 DIAGNOSIS — M549 Dorsalgia, unspecified: Secondary | ICD-10-CM | POA: Diagnosis not present

## 2017-12-14 MED FILL — CYCLOBENZAPRINE 10 MG TAB: 10 | 10 days supply | Qty: 15 | Fill #0

## 2017-12-14 MED FILL — IBUPROFEN 600 MG TABLET: 600 | 10 days supply | Qty: 30 | Fill #0

## 2017-12-29 DIAGNOSIS — F411 Generalized anxiety disorder: Secondary | ICD-10-CM | POA: Diagnosis not present

## 2018-01-03 MED FILL — busPIRone HCL 30 MG TABS: 30 | 30 days supply | Qty: 60 | Fill #1

## 2018-01-24 MED FILL — MONTELUKAST SOD 10 MG TAB: 10 | 30 days supply | Qty: 30 | Fill #0

## 2018-01-25 MED FILL — DULoxetine HCL 60 MG CPEP: 60 | 30 days supply | Qty: 60 | Fill #2

## 2018-01-26 DIAGNOSIS — F411 Generalized anxiety disorder: Secondary | ICD-10-CM | POA: Diagnosis not present

## 2018-01-26 MED FILL — PROAIR HFA 90 MCG INHALER: 108 (90 BAS | 17 days supply | Qty: 9 | Fill #0

## 2018-01-26 MED FILL — GABAPENTIN 100 MG CAP: 100 | 30 days supply | Qty: 90 | Fill #0

## 2018-02-01 MED FILL — SYMBICORT 160-4.5 MCG INH: 160-4.5 | 30 days supply | Qty: 10 | Fill #0

## 2018-02-16 MED FILL — busPIRone HCL 30 MG TABS: 30 | 30 days supply | Qty: 60 | Fill #2

## 2018-02-28 MED FILL — DULoxetine HCL 60 MG CPEP: 60 | 30 days supply | Qty: 60 | Fill #0

## 2018-02-28 MED FILL — MONTELUKAST SOD 10 MG TAB: 10 | 30 days supply | Qty: 30 | Fill #1

## 2018-03-02 DIAGNOSIS — F411 Generalized anxiety disorder: Secondary | ICD-10-CM | POA: Diagnosis not present

## 2018-03-04 DIAGNOSIS — H5213 Myopia, bilateral: Secondary | ICD-10-CM | POA: Diagnosis not present

## 2018-03-08 MED FILL — IBUPROFEN 600 MG TABLET: 600 | 10 days supply | Qty: 30 | Fill #1

## 2018-03-08 MED FILL — CYCLOBENZAPRINE 10 MG TAB: 10 | 10 days supply | Qty: 15 | Fill #1

## 2018-03-28 DIAGNOSIS — J45909 Unspecified asthma, uncomplicated: Secondary | ICD-10-CM | POA: Diagnosis not present

## 2018-03-28 DIAGNOSIS — J069 Acute upper respiratory infection, unspecified: Secondary | ICD-10-CM | POA: Diagnosis not present

## 2018-03-28 MED FILL — predniSONE 10 MG TABS: 10 | 6 days supply | Qty: 21 | Fill #0

## 2018-03-28 MED FILL — busPIRone HCL 30 MG TABS: 30 | 30 days supply | Qty: 60 | Fill #0

## 2018-03-28 MED FILL — GABAPENTIN 100 MG CAP: 100 | 30 days supply | Qty: 90 | Fill #1

## 2018-03-28 MED FILL — DULoxetine HCL 60 MG CPEP: 60 | 30 days supply | Qty: 60 | Fill #1

## 2018-03-28 MED FILL — AZITHROMYCIN 250 MG TABLET: 250 | 5 days supply | Qty: 6 | Fill #0

## 2018-04-06 MED FILL — SYMBICORT 160-4.5 MCG INH: 160-4.5 | 30 days supply | Qty: 10 | Fill #1

## 2018-04-22 MED FILL — MONTELUKAST SOD 10 MG TAB: 10 | 30 days supply | Qty: 30 | Fill #2

## 2018-05-04 MED FILL — busPIRone HCL 30 MG TABS: 30 | 30 days supply | Qty: 60 | Fill #1

## 2018-05-04 MED FILL — DULoxetine HCL 60 MG CPEP: 60 | 30 days supply | Qty: 60 | Fill #0

## 2018-05-30 DIAGNOSIS — F411 Generalized anxiety disorder: Secondary | ICD-10-CM | POA: Diagnosis not present

## 2018-05-31 MED FILL — DULoxetine HCL 60 MG CPEP: 60 | 30 days supply | Qty: 60 | Fill #1

## 2018-05-31 MED FILL — SYMBICORT 160-4.5 MCG INH: 160-4.5 | 30 days supply | Qty: 10 | Fill #2

## 2018-05-31 MED FILL — MONTELUKAST SOD 10 MG TAB: 10 | 30 days supply | Qty: 30 | Fill #3

## 2018-06-10 DIAGNOSIS — N951 Menopausal and female climacteric states: Secondary | ICD-10-CM | POA: Diagnosis not present

## 2018-06-10 DIAGNOSIS — Z6841 Body Mass Index (BMI) 40.0 and over, adult: Secondary | ICD-10-CM | POA: Diagnosis not present

## 2018-06-10 DIAGNOSIS — Z01419 Encounter for gynecological examination (general) (routine) without abnormal findings: Secondary | ICD-10-CM | POA: Diagnosis not present

## 2018-06-16 ENCOUNTER — Other Ambulatory Visit: Payer: Self-pay | Admitting: Obstetrics & Gynecology

## 2018-06-16 DIAGNOSIS — Z1231 Encounter for screening mammogram for malignant neoplasm of breast: Secondary | ICD-10-CM

## 2018-06-16 MED FILL — GABAPENTIN 100 MG CAP: 100 | 30 days supply | Qty: 60 | Fill #0

## 2018-06-16 MED FILL — busPIRone HCL 30 MG TABS: 30 | 30 days supply | Qty: 60 | Fill #2

## 2018-07-04 MED FILL — DULoxetine HCL 60 MG CPEP: 60 | 30 days supply | Qty: 60 | Fill #2

## 2018-07-13 ENCOUNTER — Ambulatory Visit
Admission: RE | Admit: 2018-07-13 | Discharge: 2018-07-13 | Disposition: A | Discharge status: Home / Self Care | Payer: 59 | Source: Ambulatory Visit | Attending: Obstetrics & Gynecology | Admitting: Obstetrics & Gynecology

## 2018-07-13 DIAGNOSIS — Z1231 Encounter for screening mammogram for malignant neoplasm of breast: Secondary | ICD-10-CM | POA: Diagnosis not present

## 2018-07-13 DIAGNOSIS — F411 Generalized anxiety disorder: Secondary | ICD-10-CM | POA: Diagnosis not present

## 2018-07-13 DIAGNOSIS — Z0000 Encounter for general adult medical examination without abnormal findings: Secondary | ICD-10-CM | POA: Diagnosis not present

## 2018-07-13 DIAGNOSIS — J45909 Unspecified asthma, uncomplicated: Secondary | ICD-10-CM | POA: Diagnosis not present

## 2018-07-13 DIAGNOSIS — J309 Allergic rhinitis, unspecified: Secondary | ICD-10-CM | POA: Diagnosis not present

## 2018-07-13 DIAGNOSIS — Z1389 Encounter for screening for other disorder: Secondary | ICD-10-CM | POA: Diagnosis not present

## 2018-07-13 DIAGNOSIS — E559 Vitamin D deficiency, unspecified: Secondary | ICD-10-CM | POA: Diagnosis not present

## 2018-07-15 ENCOUNTER — Other Ambulatory Visit: Payer: Self-pay | Admitting: Obstetrics & Gynecology

## 2018-07-15 DIAGNOSIS — R928 Other abnormal and inconclusive findings on diagnostic imaging of breast: Secondary | ICD-10-CM

## 2018-07-20 ENCOUNTER — Ambulatory Visit
Admission: RE | Admit: 2018-07-20 | Discharge: 2018-07-20 | Disposition: A | Discharge status: Home / Self Care | Payer: 59 | Source: Ambulatory Visit | Attending: Obstetrics & Gynecology | Admitting: Obstetrics & Gynecology

## 2018-07-20 ENCOUNTER — Other Ambulatory Visit: Payer: Self-pay | Admitting: Obstetrics & Gynecology

## 2018-07-20 DIAGNOSIS — R928 Other abnormal and inconclusive findings on diagnostic imaging of breast: Secondary | ICD-10-CM

## 2018-07-20 DIAGNOSIS — N6320 Unspecified lump in the left breast, unspecified quadrant: Principal | ICD-10-CM

## 2018-07-20 DIAGNOSIS — R922 Inconclusive mammogram: Secondary | ICD-10-CM | POA: Diagnosis not present

## 2018-07-20 DIAGNOSIS — N6310 Unspecified lump in the right breast, unspecified quadrant: Secondary | ICD-10-CM

## 2018-07-20 DIAGNOSIS — N6489 Other specified disorders of breast: Secondary | ICD-10-CM | POA: Diagnosis not present

## 2018-07-25 MED FILL — GABAPENTIN 100 MG CAP: 100 | 30 days supply | Qty: 60 | Fill #1

## 2018-07-25 MED FILL — SYMBICORT 160-4.5 MCG INH: 160-4.5 | 30 days supply | Qty: 10 | Fill #3

## 2018-07-25 MED FILL — MONTELUKAST SOD 10 MG TAB: 10 | 30 days supply | Qty: 30 | Fill #4

## 2018-07-25 MED FILL — busPIRone HCL 30 MG TABS: 30 | 30 days supply | Qty: 60 | Fill #0

## 2018-08-02 MED FILL — DULoxetine HCL 60 MG CPEP: 60 | 30 days supply | Qty: 60 | Fill #0

## 2018-08-06 ENCOUNTER — Encounter: Payer: Self-pay | Admitting: Behavioral Health

## 2018-08-06 DIAGNOSIS — F329 Major depressive disorder, single episode, unspecified: Secondary | ICD-10-CM | POA: Insufficient documentation

## 2018-08-06 DIAGNOSIS — F32A Depression, unspecified: Secondary | ICD-10-CM | POA: Insufficient documentation

## 2018-08-06 DIAGNOSIS — F4310 Post-traumatic stress disorder, unspecified: Secondary | ICD-10-CM

## 2018-08-15 DIAGNOSIS — R799 Abnormal finding of blood chemistry, unspecified: Secondary | ICD-10-CM | POA: Diagnosis not present

## 2018-08-23 ENCOUNTER — Ambulatory Visit: Payer: Self-pay | Admitting: Psychiatry

## 2018-09-06 MED FILL — DULoxetine HCL 60 MG CPEP: 60 | 30 days supply | Qty: 60 | Fill #1

## 2018-09-06 MED FILL — busPIRone HCL 30 MG TABS: 30 | 30 days supply | Qty: 60 | Fill #1

## 2018-09-06 MED FILL — GABAPENTIN 100 MG CAP: 100 | 30 days supply | Qty: 60 | Fill #2

## 2018-09-08 ENCOUNTER — Ambulatory Visit: Payer: Self-pay | Admitting: Psychiatry

## 2018-09-08 ENCOUNTER — Ambulatory Visit: Payer: 59 | Admitting: Psychiatry

## 2018-09-08 DIAGNOSIS — F329 Major depressive disorder, single episode, unspecified: Secondary | ICD-10-CM | POA: Diagnosis not present

## 2018-09-08 DIAGNOSIS — F4310 Post-traumatic stress disorder, unspecified: Secondary | ICD-10-CM

## 2018-09-08 DIAGNOSIS — F419 Anxiety disorder, unspecified: Secondary | ICD-10-CM | POA: Diagnosis not present

## 2018-09-08 DIAGNOSIS — F32A Depression, unspecified: Secondary | ICD-10-CM

## 2018-09-08 MED ORDER — DULOXETINE HCL 60 MG PO CPEP: ORAL_CAPSULE | ORAL | 2 refills | Status: DC

## 2018-09-08 MED ORDER — GABAPENTIN 100 MG PO CAPS: 100.0000 mg | ORAL_CAPSULE | Freq: Two times a day (BID) | ORAL | 2 refills | Status: DC

## 2018-09-08 MED ORDER — BUSPIRONE HCL 30 MG PO TABS: 30.0000 mg | ORAL_TABLET | Freq: Two times a day (BID) | ORAL | 2 refills | Status: DC

## 2018-09-08 NOTE — Progress Notes (Addendum)
Has had sleep study in past. Discuss ;next visit     [facility]  Patient ID: Jennifer Davis,  MRN: [id number]  PCP: Wenda Low, MD  Date of Evaluation: 09/08/2018 Time spent:20 minutes  Chief Complaint:   HISTORY/CURRENT STATUS: HPI patient is a 43 year old white female last visit 05/30/2018.  Carries a diagnosis of anxiety PTSD and depression.  She has some increased anxiety at the last visit.  Wanted to stay on the same medication so nothing was changed. Continues to do well.  Individual Medical History/ Review of Systems: Changes? :No   Allergies: Patient has no known allergies.  Current Medications:  Current Outpatient Medications:  .  busPIRone (BUSPAR) 30 MG tablet, Take 1 tablet (30 mg total) by mouth 2 (two) times daily., Disp: 60 tablet, Rfl: 2 .  gabapentin (NEURONTIN) 100 MG capsule, Take 1 capsule (100 mg total) by mouth 2 (two) times daily., Disp: 60 capsule, Rfl: 2 .  montelukast (SINGULAIR) 10 MG tablet, Take 1 tablet by mouth daily., Disp: , Rfl: 5 .  Multiple Vitamins-Minerals (MULTIVITAMIN PO), Take 2 tablets by mouth daily. Skin/hair/nail supplement., Disp: , Rfl:  .  PROAIR HFA 108 (90 Base) MCG/ACT inhaler, Inhale 2 puffs into the lungs as needed., Disp: , Rfl: 0 .  SYMBICORT 160-4.5 MCG/ACT inhaler, Inhale 2 puffs into the lungs 2 (two) times daily., Disp: , Rfl: 5 .  DULoxetine (CYMBALTA) 60 MG capsule, 1 bid, Disp: 60 capsule, Rfl: 2 Medication Side Effects: none  Family Medical/ Social History: Changes?  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  General Appearance: Casual  Eye Contact:  Good  Speech:  Normal Rate  Volume:  Normal  Mood:  Euthymic  Affect:  Appropriate  Thought Process:  Linear  Orientation:  Full (Time, Place, and Person)  Thought Content: Logical   Suicidal Thoughts:  No  Homicidal Thoughts:  No  Memory:   normal  Judgement:  Good  Insight:  Good  Psychomotor Activity:  Normal  Concentration:  Concentration: Good  Recall:  Good  Fund of Knowledge: Good  Language: Good  Assets:  Social Support  ADL's:  Intact  Cognition: WNL  Prognosis:  Good    DIAGNOSES:    ICD-10-CM   1. Anxiety F41.9   2. Depression, unspecified depression type F32.9   3. PTSD (post-traumatic stress disorder) F43.10     Receiving Psychotherapy: No    RECOMMENDATIONS: Patient is to continue her same medicine regime that Cymbalta 120 a day BuSpar 30 mg twice a day and Neurontin 100 mg a day as needed she is to return in 3 months we will discuss sleep she has had a sleep study in the [employer], PA-C

## 2018-09-26 DIAGNOSIS — J3089 Other allergic rhinitis: Secondary | ICD-10-CM | POA: Diagnosis not present

## 2018-09-26 DIAGNOSIS — J301 Allergic rhinitis due to pollen: Secondary | ICD-10-CM | POA: Diagnosis not present

## 2018-09-26 DIAGNOSIS — J3081 Allergic rhinitis due to animal (cat) (dog) hair and dander: Secondary | ICD-10-CM | POA: Diagnosis not present

## 2018-09-26 DIAGNOSIS — J4540 Moderate persistent asthma, uncomplicated: Secondary | ICD-10-CM | POA: Diagnosis not present

## 2018-09-26 MED FILL — MONTELUKAST SOD 10 MG TAB: 10 | 30 days supply | Qty: 30 | Fill #0

## 2018-09-26 MED FILL — SYMBICORT 160-4.5 MCG INH: 160-4.5 | 30 days supply | Qty: 10 | Fill #0

## 2018-09-30 ENCOUNTER — Ambulatory Visit
Admission: RE | Admit: 2018-09-30 | Discharge: 2018-09-30 | Disposition: A | Discharge status: Home / Self Care | Payer: 59 | Source: Ambulatory Visit | Attending: Internal Medicine | Admitting: Internal Medicine

## 2018-09-30 ENCOUNTER — Other Ambulatory Visit: Payer: Self-pay | Admitting: Internal Medicine

## 2018-09-30 DIAGNOSIS — M5442 Lumbago with sciatica, left side: Secondary | ICD-10-CM | POA: Diagnosis not present

## 2018-09-30 DIAGNOSIS — M1610 Unilateral primary osteoarthritis, unspecified hip: Secondary | ICD-10-CM

## 2018-09-30 DIAGNOSIS — M545 Low back pain: Secondary | ICD-10-CM | POA: Diagnosis not present

## 2018-09-30 DIAGNOSIS — M25551 Pain in right hip: Secondary | ICD-10-CM | POA: Diagnosis not present

## 2018-09-30 MED FILL — NAPROXEN 500 MG TABLET: 500 | 10 days supply | Qty: 20 | Fill #0

## 2018-09-30 MED FILL — CYCLOBENZAPRINE 10 MG TAB: 10 | 5 days supply | Qty: 15 | Fill #0

## 2018-10-07 MED FILL — DULoxetine HCL 60 MG CPEP: 60 | 30 days supply | Qty: 60 | Fill #2

## 2018-10-27 MED FILL — HYDROCODON-APAP 7.5-325: 7.5-325 | 3 days supply | Qty: 12 | Fill #0

## 2018-10-27 MED FILL — AMOXICILLIN 875 MG TABLET: 875 | 10 days supply | Qty: 20 | Fill #0

## 2018-10-27 MED FILL — IBUPROFEN 800 MG TAB: 800 | 8 days supply | Qty: 24 | Fill #0

## 2018-10-27 MED FILL — busPIRone HCL 30 MG TABS: 30 | 30 days supply | Qty: 60 | Fill #2

## 2018-11-08 ENCOUNTER — Other Ambulatory Visit: Payer: Self-pay | Admitting: Psychiatry

## 2018-11-08 MED FILL — PROAIR HFA 90 MCG INHALER: 108 (90 BAS | 17 days supply | Qty: 9 | Fill #0

## 2018-11-08 MED FILL — MONTELUKAST SOD 10 MG TAB: 10 | 30 days supply | Qty: 30 | Fill #1

## 2018-12-08 ENCOUNTER — Ambulatory Visit: Payer: 59 | Admitting: Psychiatry

## 2018-12-08 DIAGNOSIS — F4310 Post-traumatic stress disorder, unspecified: Secondary | ICD-10-CM

## 2018-12-08 DIAGNOSIS — F419 Anxiety disorder, unspecified: Secondary | ICD-10-CM

## 2018-12-08 DIAGNOSIS — F329 Major depressive disorder, single episode, unspecified: Secondary | ICD-10-CM

## 2018-12-08 DIAGNOSIS — F32A Depression, unspecified: Secondary | ICD-10-CM

## 2018-12-08 MED ORDER — BUSPIRONE HCL 30 MG PO TABS: 30.0000 mg | ORAL_TABLET | Freq: Two times a day (BID) | ORAL | 2 refills | Status: DC

## 2018-12-08 MED ORDER — DULOXETINE HCL 60 MG PO CPEP: ORAL_CAPSULE | ORAL | 2 refills | Status: DC

## 2018-12-08 MED ORDER — GABAPENTIN 100 MG PO CAPS: 100.0000 mg | ORAL_CAPSULE | Freq: Three times a day (TID) | ORAL | 2 refills | Status: DC

## 2018-12-08 MED FILL — busPIRone HCL 30 MG TABS: 30 | 30 days supply | Qty: 60 | Fill #0

## 2018-12-08 MED FILL — DULoxetine HCL 60 MG CPEP: 60 | 30 days supply | Qty: 60 | Fill #0

## 2018-12-08 MED FILL — GABAPENTIN 100 MG CAPSULE: 100 | 30 days supply | Qty: 90 | Fill #0

## 2018-12-08 NOTE — Progress Notes (Signed)
[facility]  Patient ID: Jennifer Davis,  MRN: [id number]  PCP: Wenda Low, MD  Date of Evaluation: 12/08/2018 Time spent:20 minutes  Chief Complaint:   HISTORY/CURRENT STATUS: HPI patient with diagnoses of anxiety, depression, PTSD.  Last seen 09/08/2018.  Doing well at the time. Depression is good.  She is having some more anxiety.  Individual Medical History/ Review of Systems: Changes? :No   Allergies: Patient has no known allergies.  Current Medications:  Current Outpatient Medications:  .  busPIRone (BUSPAR) 30 MG tablet, Take 1 tablet (30 mg total) by mouth 2 (two) times daily., Disp: 60 tablet, Rfl: 2 .  DULoxetine (CYMBALTA) 60 MG capsule, TAKE 2 CAPSULE BY MOUTH DAILY, Disp: 60 capsule, Rfl: 0 .  gabapentin (NEURONTIN) 100 MG capsule, Take 1 capsule (100 mg total) by mouth 2 (two) times daily., Disp: 60 capsule, Rfl: 2 .  montelukast (SINGULAIR) 10 MG tablet, Take 1 tablet by mouth daily., Disp: , Rfl: 5 .  Multiple Vitamins-Minerals (MULTIVITAMIN PO), Take 2 tablets by mouth daily. Skin/hair/nail supplement., Disp: , Rfl:  .  PROAIR HFA 108 (90 Base) MCG/ACT inhaler, Inhale 2 puffs into the lungs as needed., Disp: , Rfl: 0 .  SYMBICORT 160-4.5 MCG/ACT inhaler, Inhale 2 puffs into the lungs 2 (two) times daily., Disp: , Rfl: 5 Medication Side Effects: none  Family Medical/ Social History: Changes? no MENTAL HEALTH EXAM:  There were no vitals taken for this visit.There is no height or weight on file to calculate BMI.  General Appearance: Casual  Eye Contact:  Good  Speech:  Clear and Coherent  Volume:  Normal  Mood:  Euthymic  Affect:  Appropriate  Thought Process:  Linear  Orientation:  Full (Time, Place, and Person)  Thought Content: Logical   Suicidal Thoughts:  No  Homicidal Thoughts:  No  Memory:  WNL  Judgement:  Good  Insight:  Good  Psychomotor Activity:  Normal  Concentration:  Concentration: Good  Recall:  Good  Fund of Knowledge:  Good  Language: Good  Assets:  Desire for Improvement  ADL's:  Intact  Cognition: WNL  Prognosis:  Good    DIAGNOSES:    ICD-10-CM   1. Depression, unspecified depression type F32.9   2. Anxiety F41.9   3. PTSD (post-traumatic stress disorder) F43.10     Receiving Psychotherapy: No    RECOMMENDATIONS: Patient will continue the same medications.  Spohr 30 mg twice a day.  Cymbalta 60 mg 2 a day.  Gabapentin 100 mg 3 times a day.   Comer Locket, PA-C

## 2019-01-10 MED FILL — DULoxetine HCL 60 MG CPEP: 60 | 30 days supply | Qty: 60 | Fill #1 | Status: TO

## 2019-01-13 MED FILL — MONTELUKAST SOD 10 MG TAB: 10 | 30 days supply | Qty: 30 | Fill #2

## 2019-01-17 MED FILL — AMOXICILLIN 875 MG TABS: 875 | 10 days supply | Qty: 20 | Fill #0

## 2019-01-20 ENCOUNTER — Ambulatory Visit
Admission: RE | Admit: 2019-01-20 | Discharge: 2019-01-20 | Disposition: A | Discharge status: Home / Self Care | Payer: 59 | Source: Ambulatory Visit | Attending: Obstetrics & Gynecology | Admitting: Obstetrics & Gynecology

## 2019-01-20 ENCOUNTER — Other Ambulatory Visit: Payer: Self-pay

## 2019-01-20 ENCOUNTER — Other Ambulatory Visit: Payer: Self-pay | Admitting: Obstetrics & Gynecology

## 2019-01-20 DIAGNOSIS — N6310 Unspecified lump in the right breast, unspecified quadrant: Secondary | ICD-10-CM

## 2019-01-20 DIAGNOSIS — N6320 Unspecified lump in the left breast, unspecified quadrant: Principal | ICD-10-CM

## 2019-01-20 DIAGNOSIS — N6012 Diffuse cystic mastopathy of left breast: Secondary | ICD-10-CM | POA: Diagnosis not present

## 2019-01-20 DIAGNOSIS — R922 Inconclusive mammogram: Secondary | ICD-10-CM | POA: Diagnosis not present

## 2019-01-20 DIAGNOSIS — N6011 Diffuse cystic mastopathy of right breast: Secondary | ICD-10-CM | POA: Diagnosis not present

## 2019-02-02 MED FILL — SYMBICORT 160-4.5 MCG INH: 160-4.5 | 30 days supply | Qty: 10 | Fill #0

## 2019-02-02 MED FILL — busPIRone HCL 30 MG TABS: 30 | 30 days supply | Qty: 60 | Fill #0

## 2019-02-11 MED FILL — DULOXETINE HCL 60 MG CPEP: 60 | 30 days supply | Qty: 60 | Fill #0

## 2019-03-18 MED FILL — DULOXETINE HCL 60 MG CPEP: 60 | 30 days supply | Qty: 60 | Fill #0

## 2019-04-06 ENCOUNTER — Ambulatory Visit: Payer: 59 | Admitting: Psychiatry

## 2019-04-24 DIAGNOSIS — H524 Presbyopia: Secondary | ICD-10-CM | POA: Diagnosis not present

## 2019-04-26 MED FILL — SYMBICORT 160-4.5 MCG INH: 160-4.5 | 30 days supply | Qty: 10 | Fill #0

## 2019-04-26 MED FILL — MONTELUKAST SOD 10 MG TAB: 10 | 30 days supply | Qty: 30 | Fill #3

## 2019-04-26 MED FILL — DULoxetine HCL 60 MG CPEP: 60 | 30 days supply | Qty: 60 | Fill #0

## 2019-05-10 ENCOUNTER — Ambulatory Visit: Payer: 59 | Admitting: Physician Assistant

## 2019-05-12 MED FILL — busPIRone HCL 30 MG TABS: 30 | 30 days supply | Qty: 60 | Fill #0

## 2019-05-26 MED FILL — ALBUTEROL SULFATE HFA 108 (: 108 (90 BAS | 25 days supply | Qty: 9 | Fill #0

## 2019-05-29 DIAGNOSIS — R202 Paresthesia of skin: Secondary | ICD-10-CM | POA: Diagnosis not present

## 2019-05-29 MED FILL — DULoxetine HCL 60 MG CPEP: 60 | 30 days supply | Qty: 60 | Fill #1

## 2019-06-05 DIAGNOSIS — G5600 Carpal tunnel syndrome, unspecified upper limb: Secondary | ICD-10-CM | POA: Diagnosis not present

## 2019-06-09 MED FILL — GABAPENTIN 100 MG CAPSULE: 100 | 30 days supply | Qty: 90 | Fill #1

## 2019-06-15 ENCOUNTER — Ambulatory Visit: Payer: 59 | Admitting: Physician Assistant

## 2019-06-15 ENCOUNTER — Other Ambulatory Visit: Payer: Self-pay

## 2019-06-22 DIAGNOSIS — R202 Paresthesia of skin: Secondary | ICD-10-CM | POA: Diagnosis not present

## 2019-06-30 DIAGNOSIS — N898 Other specified noninflammatory disorders of vagina: Secondary | ICD-10-CM | POA: Diagnosis not present

## 2019-06-30 DIAGNOSIS — Z01411 Encounter for gynecological examination (general) (routine) with abnormal findings: Secondary | ICD-10-CM | POA: Diagnosis not present

## 2019-06-30 DIAGNOSIS — Z6841 Body Mass Index (BMI) 40.0 and over, adult: Secondary | ICD-10-CM | POA: Diagnosis not present

## 2019-06-30 DIAGNOSIS — L304 Erythema intertrigo: Secondary | ICD-10-CM | POA: Diagnosis not present

## 2019-06-30 MED FILL — CLOTRIM-BETAMETH LOT 1-.05%: 1-0.05 | 30 days supply | Qty: 30 | Fill #0

## 2019-07-04 ENCOUNTER — Other Ambulatory Visit: Payer: Self-pay

## 2019-07-04 ENCOUNTER — Emergency Department (HOSPITAL_COMMUNITY)
Admission: EM | Admit: 2019-07-04 | Discharge: 2019-07-05 | Disposition: A | Discharge status: Home / Self Care | Payer: 59 | Attending: Emergency Medicine | Admitting: Emergency Medicine

## 2019-07-04 ENCOUNTER — Encounter (HOSPITAL_COMMUNITY): Payer: Self-pay | Admitting: Emergency Medicine

## 2019-07-04 DIAGNOSIS — T63441A Toxic effect of venom of bees, accidental (unintentional), initial encounter: Secondary | ICD-10-CM

## 2019-07-04 DIAGNOSIS — T63461A Toxic effect of venom of wasps, accidental (unintentional), initial encounter: Secondary | ICD-10-CM | POA: Insufficient documentation

## 2019-07-04 DIAGNOSIS — L509 Urticaria, unspecified: Secondary | ICD-10-CM | POA: Diagnosis present

## 2019-07-04 LAB — I-STAT BETA HCG BLOOD, ED (MC, WL, AP ONLY): I-stat hCG, quantitative: <5 m[IU]/mL (ref <5)

## 2019-07-04 MED ORDER — FAMOTIDINE IN NACL 20-0.9 MG/50ML-% IV SOLN
20.0000 mg | Freq: Once | INTRAVENOUS | Status: AC
Start: 2019-07-04 — End: 2019-07-04
  Administered 2019-07-04: 20 mg via INTRAVENOUS
  Filled 2019-07-04: qty 50

## 2019-07-04 MED ORDER — EPINEPHRINE 0.3 MG/0.3ML IJ SOAJ
0.3000 mg | Freq: Once | INTRAMUSCULAR | Status: AC
Start: 2019-07-04 — End: 2019-07-04
  Administered 2019-07-04: 0.3 mg via INTRAMUSCULAR
  Filled 2019-07-04: qty 0.3

## 2019-07-04 MED ORDER — METHYLPREDNISOLONE SODIUM SUCC 125 MG IJ SOLR
125.0000 mg | Freq: Once | INTRAMUSCULAR | Status: AC
Start: 2019-07-04 — End: 2019-07-04
  Administered 2019-07-04: 125 mg via INTRAVENOUS
  Filled 2019-07-04: qty 2

## 2019-07-04 MED ORDER — SODIUM CHLORIDE 0.9 % IV BOLUS
1000.0000 mL | Freq: Once | INTRAVENOUS | Status: AC
Start: 2019-07-04 — End: 2019-07-04
  Administered 2019-07-04: 1000 mL via INTRAVENOUS

## 2019-07-04 MED ORDER — ONDANSETRON HCL 4 MG/2ML IJ SOLN
4.0000 mg | Freq: Once | INTRAMUSCULAR | Status: AC
Start: 2019-07-04 — End: 2019-07-04
  Administered 2019-07-04: 4 mg via INTRAVENOUS
  Filled 2019-07-04: qty 2

## 2019-07-04 MED ORDER — SODIUM CHLORIDE 0.9 % IV SOLN: INTRAVENOUS | Status: DC

## 2019-07-04 NOTE — ED Triage Notes (Signed)
Patient presents with generalized skin redness/rashes with itching onset this evening after insect bite at left buttock by a yellow jacket , respirations unlabored/no oral swelling.

## 2019-07-04 NOTE — ED Provider Notes (Signed)
[facility] EMERGENCY DEPARTMENT Provider Note   CSN: [id number] Arrival date & time: 07/04/19  2011     History   Chief Complaint Chief Complaint  Patient presents with  . Allergic Reaction    HPI Jennifer Davis is a 43 y.o. female who presents emergency department chief complaint of hives after wasp sting.  Patient has a past medical history of asthma and allergies.  She has no previous history of anaphylaxis.  Patient states that she was stung on her buttocks by a yellow jacket at around 6:50 PM.  By 730 she began having itching all over her skin.  She took a shower when she got out she was covered in hives.  She took 50 mg of Benadryl and came to the emergency department.  During her stay here she she has had progressively worsening hives and has had wheezing since this started.  She denies any tongue swelling or throat closing.  She denies nausea, vomiting or syncope.     HPI  Past Medical History:  Diagnosis Date  . Anxiety   . Asthma     Patient Active Problem List   Diagnosis Date Noted  . Depression 08/06/2018  . PTSD (post-traumatic stress disorder) 08/06/2018  . Palpitations 01/09/2016  . Shortness of breath 01/09/2016  . Anxiety 01/09/2016  . Asthma 01/09/2016  . Morbid obesity ([place]) 01/09/2016    Past Surgical History:  Procedure Laterality Date  . CESAREAN SECTION     x3     OB History   No obstetric history on file.      Home Medications    Prior to Admission medications   Medication Sig Start Date End Date Taking? Authorizing Provider  busPIRone (BUSPAR) 30 MG tablet Take 1 tablet (30 mg total) by mouth 2 (two) times daily. 12/08/18  Yes Shugart, Lissa Hoard, PA-C  cetirizine (ZYRTEC) 10 MG tablet Take 10 mg by mouth daily.   Yes [provider]  DULoxetine (CYMBALTA) 60 MG capsule TAKE 2 CAPSULE BY MOUTH DAILY Patient taking differently: Take 60 mg by mouth 2 (two) times daily.  12/08/18  Yes Shugart, Lissa Hoard, PA-C  gabapentin  (NEURONTIN) 100 MG capsule Take 1 capsule (100 mg total) by mouth 3 (three) times daily. 12/08/18  Yes Shugart, Lissa Hoard, PA-C  montelukast (SINGULAIR) 10 MG tablet Take 1 tablet by mouth daily. 11/18/15  Yes [provider]  Multiple Vitamins-Minerals (MULTIVITAMIN PO) Take 2 tablets by mouth daily. Skin/hair/nail supplement.   Yes [provider]  PROAIR HFA 108 (90 Base) MCG/ACT inhaler Inhale 2 puffs into the lungs as needed. 10/29/15  Yes [provider]  SYMBICORT 160-4.5 MCG/ACT inhaler Inhale 2 puffs into the lungs 2 (two) times daily. 11/18/15  Yes [provider]    Family History Family History  Problem Relation Age of Onset  . Hypertension Mother   . Fibromyalgia Mother   . Asthma Sister        x2  . Yves Dill Parkinson White syndrome Maternal Aunt 46  . Hypertension Maternal Grandmother   . Hypertension Maternal Grandfather   . Cancer - Lung Maternal Grandfather   . Stroke Maternal Grandfather   . Breast cancer Neg Hx     Social History Social History   Tobacco Use  . Smoking status: Former Smoker    Types: Cigarettes    Quit date: 01/09/2012    Years since quitting: 7.4  . Tobacco comment: social smoker  Substance Use Topics  . Alcohol use: Never  Alcohol/week: 0.0 standard drinks    Frequency: Never  . Drug use: Never     Allergies   Patient has no known allergies.   Review of Systems Review of Systems Ten systems reviewed and are negative for acute change, except as noted in the HPI.    Physical Exam Updated Vital Signs BP 134/82   Pulse (!) 124   Temp 98.6 F (37 C) (Oral)   Resp 15   LMP 06/30/2019   SpO2 97%   Physical Exam Vitals signs and nursing note reviewed.  Constitutional:      General: She is not in acute distress.    Appearance: She is well-developed. She is not diaphoretic.  HENT:     Head: Normocephalic and atraumatic.  Eyes:     General: No scleral icterus.    Conjunctiva/sclera: Conjunctivae  normal.  Neck:     Musculoskeletal: Normal range of motion.  Cardiovascular:     Rate and Rhythm: Normal rate and regular rhythm.     Heart sounds: Normal heart sounds. No murmur. No friction rub. No gallop.   Pulmonary:     Effort: Pulmonary effort is normal. No respiratory distress.     Breath sounds: Wheezing present.  Abdominal:     General: Bowel sounds are normal. There is no distension.     Palpations: Abdomen is soft. There is no mass.     Tenderness: There is no abdominal tenderness. There is no guarding.  Skin:    General: Skin is warm and dry.     Comments: Hives   Neurological:     Mental Status: She is alert and oriented to person, place, and time.  Psychiatric:        Behavior: Behavior normal.      ED Treatments / Results  Labs (all labs ordered are listed, but only abnormal results are displayed) Labs Reviewed  I-STAT BETA HCG BLOOD, ED ([facility], [facility], [facility] ONLY)    EKG EKG Interpretation  Date/Time:  Tuesday July 04 2019 20:23:44 EDT Ventricular Rate:  132 PR Interval:  126 QRS Duration: 74 QT Interval:  312 QTC Calculation: 462 R Axis:   -14 Text Interpretation:  Sinus tachycardia Anterior injury pattern Abnormal ECG Confirmed by Carmin Muskrat [phone]) on 07/04/2019 9:07:22 PM   Radiology No results found.  Procedures .Critical Care Performed by: Margarita Mail, PA-C Authorized by: Margarita Mail, PA-C   Critical care provider statement:    Critical care time (minutes):  30   Critical care time was exclusive of:  Separately billable procedures and treating other patients   Critical care was necessary to treat or prevent imminent or life-threatening deterioration of the following conditions: Anaphylaxis.   Critical care was time spent personally by me on the following activities:  Discussions with consultants, evaluation of patient's response to treatment, examination of patient, ordering and performing treatments and interventions, ordering and  review of laboratory studies, ordering and review of radiographic studies, pulse oximetry, re-evaluation of patient's condition, obtaining history from patient or surrogate and review of old charts   (including critical care time)  Medications Ordered in ED Medications  sodium chloride 0.9 % bolus 1,000 mL (1,000 mLs Intravenous New Bag/Given 07/04/19 2213)    And  0.9 %  sodium chloride infusion (has no administration in time range)  famotidine (PEPCID) IVPB 20 mg premix (20 mg Intravenous New Bag/Given 07/04/19 2225)  EPINEPHrine (EPI-PEN) injection 0.3 mg (0.3 mg Intramuscular Given 07/04/19 2208)  ondansetron (ZOFRAN) injection 4 mg (4  mg Intravenous Given 07/04/19 2220)  methylPREDNISolone sodium succinate (SOLU-MEDROL) 125 mg/2 mL injection 125 mg (125 mg Intravenous Given 07/04/19 2218)     Initial Impression / Assessment and Plan / ED Course  I have reviewed the triage vital signs and the nursing notes.  Pertinent labs & imaging results that were available during my care of the patient were reviewed by me and considered in my medical decision making (see chart for details).        Patient here with anaphylactoid reaction to wasp sting.  Unfortunately she sat for a bit of time prior to my knowledge of the severity of her process however at the time that I saw her she was not actively wheezing and without oral swelling.  Patient was given IM epinephrine, Solu-Medrol, Pepcid she had already taken 50 mg of of p.o. Benadryl patient had significant improvement in her heart rate after epi as well as her symptoms of generalized hives.  Patient is currently still under observation by staff here after administration of epinephrine.  Have given signout to [facility] who will dispose the patient after her period of observation.  She is stable throughout her visit here in the emergency department.  Final Clinical Impressions(s) / ED Diagnoses   Final diagnoses:  Anaphylactic reaction to bee sting,  accidental or unintentional, initial encounter    ED Discharge Orders    None       Margarita Mail, PA-C 07/07/19 1534    Carmin Muskrat, MD 07/08/19 1230

## 2019-07-05 ENCOUNTER — Telehealth: Payer: Self-pay | Admitting: Physician Assistant

## 2019-07-05 ENCOUNTER — Other Ambulatory Visit: Payer: Self-pay

## 2019-07-05 DIAGNOSIS — T63461A Toxic effect of venom of wasps, accidental (unintentional), initial encounter: Secondary | ICD-10-CM | POA: Diagnosis not present

## 2019-07-05 MED ORDER — SODIUM CHLORIDE 0.9 % IV BOLUS
500.0000 mL | Freq: Once | INTRAVENOUS | Status: AC
  Administered 2019-07-05: 500 mL via INTRAVENOUS

## 2019-07-05 MED ORDER — DULOXETINE HCL 60 MG PO CPEP: ORAL_CAPSULE | ORAL | 2 refills | Status: DC

## 2019-07-05 MED ORDER — METHYLPREDNISOLONE 4 MG PO TBPK: ORAL_TABLET | ORAL | 0 refills | Status: DC

## 2019-07-05 MED ORDER — EPINEPHRINE 0.3 MG/0.3ML IJ SOAJ: 0.3000 mg | Freq: Once | INTRAMUSCULAR | 1 refills | Status: DC | PRN

## 2019-07-05 MED FILL — EPINEPHRINE 0.3 MG AUTO-INJ: 0.3 | 30 days supply | Qty: 2 | Fill #0

## 2019-07-05 MED FILL — METHYLPREDNISOLONE 4 MG TAB: 4 | 6 days supply | Qty: 21 | Fill #0

## 2019-07-05 MED FILL — DULoxetine HCL 60 MG CPEP: 60 | 30 days supply | Qty: 60 | Fill #0

## 2019-07-05 NOTE — Telephone Encounter (Signed)
Refill submitted. 

## 2019-07-05 NOTE — Telephone Encounter (Signed)
Patient called and said that she needs a refill on her cymbalta 60 mg 2 times daily. She has an appointment  In October with teresa

## 2019-07-05 NOTE — Discharge Instructions (Signed)
Get help right away if: You develop symptoms of an allergic reaction. You may notice them soon after you are exposed to a substance. Symptoms may include: Flushed skin. Hives. Swelling of the eyes, lips, face, mouth, tongue, or throat. Difficulty breathing, speaking, or swallowing. Wheezing. Dizziness or light-headedness. Fainting. Pain or cramping in the abdomen. Vomiting. Diarrhea. You used epinephrine. You need more medical care even if the medicine seems to be working. This is important because anaphylaxis may happen again within 72 hours (rebound anaphylaxis). You may need more doses of epinephrine.

## 2019-07-05 NOTE — ED Provider Notes (Signed)
43 year old female received a signout from [place] pending observation. Per her HPI:   "Jennifer Davis is a 43 y.o. female who presents emergency department chief complaint of hives after wasp sting.  Patient has a past medical history of asthma and allergies.  She has no previous history of anaphylaxis.  Patient states that she was stung on her buttocks by a yellow jacket at around 6:50 PM.  By 730 she began having itching all over her skin.  She took a shower when she got out she was covered in hives.  She took 50 mg of Benadryl and came to the emergency department.  During her stay here she she has had progressively worsening hives and has had wheezing since this started.  She denies any tongue swelling or throat closing.  She denies nausea, vomiting or syncope."  Physical Exam  BP 100/88 (BP Location: Right Arm)   Pulse (!) 113   Temp 98.6 F (37 C) (Oral)   Resp 14   LMP 06/30/2019   SpO2 97%   Physical Exam Vitals signs and nursing note reviewed.  Constitutional:      General: She is not in acute distress.    Appearance: She is well-developed.  HENT:     Head: Normocephalic and atraumatic.  Eyes:     Pupils: Pupils are equal, round, and reactive to light.  Neck:     Musculoskeletal: Normal range of motion and neck supple.     Trachea: No tracheal deviation.  Cardiovascular:     Rate and Rhythm: Normal rate.     Pulses: Normal pulses.     Heart sounds: Normal heart sounds. No murmur. No friction rub. No gallop.   Pulmonary:     Effort: Pulmonary effort is normal. No respiratory distress.     Breath sounds: No stridor. No wheezing, rhonchi or rales.     Comments: Speaks in complete, fluent sentences without increased work of breathing. Abdominal:     General: There is no distension.     Palpations: Abdomen is soft.  Musculoskeletal: Normal range of motion.  Skin:    General: Skin is warm and dry.     Comments: Faint urticarial rash noted on the bilateral forearms.   Neurological:     Mental Status: She is alert.  Psychiatric:        Behavior: Behavior normal.     ED Course/Procedures     Procedures  MDM   43 year old female received a signout from [facility] pending observation.  Patient developed urticarial rash after being stung by a yellow jacket earlier tonight.  She was given epinephrine at 22:20.  At conclusion of observation.,  Patient was still tachycardic in the 110s.  Will order 500 cc fluid bolus.   -02:25 AM patient rechecked.  Heart rate has improved to high 90s on arrival.  Does seem to elevate into the when she talks about the episode.  Per chart review, she does have a history of anxiety.  At this time, I feel that no further urgent or emergent work-up is indicated.  Will discharge with Rx for EpiPen and Medrol Dosepak per PA Harris's plan.  All questions answered.  ER return precautions given.  She is established with immunology for follow-up.  She is hemodynamically stable and in no acute distress.  Safe for discharge to home with outpatient follow-up.     Joanne Gavel, PA-C [id number]    Delora Fuel, MD [id number] [phone]

## 2019-07-05 NOTE — Telephone Encounter (Signed)
Pharmacy is [facility]

## 2019-07-07 MED FILL — SYMBICORT 160-4.5 MCG INH: 160-4.5 | 30 days supply | Qty: 10 | Fill #1

## 2019-07-11 MED FILL — busPIRone HCL 30 MG TABS: 30 | 30 days supply | Qty: 60 | Fill #0

## 2019-07-24 ENCOUNTER — Other Ambulatory Visit: Payer: 59

## 2019-07-26 ENCOUNTER — Ambulatory Visit
Admission: RE | Admit: 2019-07-26 | Discharge: 2019-07-26 | Disposition: A | Discharge status: Home / Self Care | Payer: 59 | Source: Ambulatory Visit | Attending: Obstetrics & Gynecology | Admitting: Obstetrics & Gynecology

## 2019-07-26 ENCOUNTER — Other Ambulatory Visit: Payer: Self-pay

## 2019-07-26 DIAGNOSIS — N6323 Unspecified lump in the left breast, lower outer quadrant: Secondary | ICD-10-CM | POA: Diagnosis not present

## 2019-07-26 DIAGNOSIS — N6310 Unspecified lump in the right breast, unspecified quadrant: Secondary | ICD-10-CM

## 2019-07-26 DIAGNOSIS — G5603 Carpal tunnel syndrome, bilateral upper limbs: Secondary | ICD-10-CM | POA: Diagnosis not present

## 2019-07-26 DIAGNOSIS — R922 Inconclusive mammogram: Secondary | ICD-10-CM | POA: Diagnosis not present

## 2019-07-26 DIAGNOSIS — M79621 Pain in right upper arm: Secondary | ICD-10-CM | POA: Diagnosis not present

## 2019-07-26 DIAGNOSIS — N6011 Diffuse cystic mastopathy of right breast: Secondary | ICD-10-CM | POA: Diagnosis not present

## 2019-07-26 DIAGNOSIS — N6321 Unspecified lump in the left breast, upper outer quadrant: Secondary | ICD-10-CM | POA: Diagnosis not present

## 2019-07-26 DIAGNOSIS — N6320 Unspecified lump in the left breast, unspecified quadrant: Secondary | ICD-10-CM

## 2019-07-26 DIAGNOSIS — N6311 Unspecified lump in the right breast, upper outer quadrant: Secondary | ICD-10-CM | POA: Diagnosis not present

## 2019-07-26 DIAGNOSIS — N6313 Unspecified lump in the right breast, lower outer quadrant: Secondary | ICD-10-CM | POA: Diagnosis not present

## 2019-08-07 MED FILL — DULoxetine HCL 60 MG CPEP: 60 | 30 days supply | Qty: 60 | Fill #1

## 2019-08-07 MED FILL — GABAPENTIN 100 MG CAPSULE: 100 | 30 days supply | Qty: 90 | Fill #2

## 2019-08-11 ENCOUNTER — Encounter: Payer: Self-pay | Admitting: Physician Assistant

## 2019-08-11 ENCOUNTER — Other Ambulatory Visit: Payer: Self-pay

## 2019-08-11 ENCOUNTER — Ambulatory Visit (INDEPENDENT_AMBULATORY_CARE_PROVIDER_SITE_OTHER): Payer: 59 | Admitting: Physician Assistant

## 2019-08-11 DIAGNOSIS — F4310 Post-traumatic stress disorder, unspecified: Secondary | ICD-10-CM

## 2019-08-11 DIAGNOSIS — F329 Major depressive disorder, single episode, unspecified: Secondary | ICD-10-CM

## 2019-08-11 DIAGNOSIS — F32A Depression, unspecified: Secondary | ICD-10-CM

## 2019-08-11 DIAGNOSIS — F419 Anxiety disorder, unspecified: Secondary | ICD-10-CM | POA: Diagnosis not present

## 2019-08-11 NOTE — Progress Notes (Signed)
[facility] Med [facility]  Patient ID: Jennifer Davis,  MRN: [id number]  PCP: Wenda Low, MD  Date of Evaluation: 08/11/2019 Time spent:15 minutes   Chief Complaint:  Chief Complaint    Anxiety; Follow-up      HISTORY/CURRENT STATUS: HPI For routine med check. Transferring to my care after her provider and my colleague, [place], [place] passed away recently.   Feels that anxiety is well-controlled.  Her Mom was recently dx w/ appendix/colon CA.  Is on chemo now. That does cause her some anxiety. Husband has been on medical leave from his job for back pain. And then he was let go. That's been stressful too. He has a new job that he'll start in about 2 weeks. Her son was let go from his job b/c of Covid. So, just a lot of stuff happening in her life.  But overall meds are helpful. She's taking the Gabapentin prn now before she starts having a panic attack.    Patient denies loss of interest in usual activities and is able to enjoy things.  Denies decreased energy or motivation.  Appetite has not changed.  No extreme sadness, tearfulness, or feelings of hopelessness.  Denies any changes in concentration, making decisions or remembering things.  Denies suicidal or homicidal thoughts.  Denies dizziness, syncope, seizures, numbness, tingling, tremor, tics, unsteady gait, slurred speech, confusion. Denies muscle or joint pain, stiffness, or dystonia.  Individual Medical History/ Review of Systems: Changes? :No    Past medications for mental health diagnoses include: Lexapro, Prozac wasn't effective, Xanax  Allergies: Patient has no known allergies.  Current Medications:  Current Outpatient Medications:  .  APPLE CIDER VINEGAR PO, Take by mouth., Disp: , Rfl:  .  busPIRone (BUSPAR) 30 MG tablet, Take 1 tablet (30 mg total) by mouth 2 (two) times daily., Disp: 60 tablet, Rfl: 2 .  cetirizine (ZYRTEC) 10 MG tablet, Take 10 mg by mouth daily., Disp: , Rfl:  .  DULoxetine (CYMBALTA) 60 MG  capsule, TAKE 2 CAPSULE BY MOUTH DAILY, Disp: 60 capsule, Rfl: 2 .  EPINEPHrine (EPIPEN 2-PAK) 0.3 mg/0.3 mL IJ SOAJ injection, Inject 0.3 mLs (0.3 mg total) into the muscle once as needed for up to 1 dose (for severe allergic reaction). CAll 911 immediately if you have to use this medicine, Disp: 1 each, Rfl: 1 .  gabapentin (NEURONTIN) 100 MG capsule, Take 1 capsule (100 mg total) by mouth 3 (three) times daily. (Patient taking differently: Take 100 mg by mouth 3 (three) times daily as needed. ), Disp: 90 capsule, Rfl: 2 .  montelukast (SINGULAIR) 10 MG tablet, Take 1 tablet by mouth daily., Disp: , Rfl: 5 .  Multiple Vitamins-Minerals (MULTIVITAMIN PO), Take 2 tablets by mouth daily. Skin/hair/nail supplement., Disp: , Rfl:  .  PROAIR HFA 108 (90 Base) MCG/ACT inhaler, Inhale 2 puffs into the lungs as needed., Disp: , Rfl: 0 .  SYMBICORT 160-4.5 MCG/ACT inhaler, Inhale 2 puffs into the lungs 2 (two) times daily., Disp: , Rfl: 5 .  methylPREDNISolone (MEDROL DOSEPAK) 4 MG TBPK tablet, Use as directed (Patient not taking: Reported on 08/11/2019), Disp: 21 tablet, Rfl: 0 Medication Side Effects: none  Family Medical/ Social History: Changes? 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   MENTAL HEALTH EXAM:  There were no vitals taken for this visit.There is no height or weight on file to calculate BMI.  General  Appearance: Casual, Neat, Well Groomed and Obese  Eye Contact:  Good  Speech:  Clear and Coherent  Volume:  Normal  Mood:  Euthymic  Affect:  Appropriate  Thought Process:  Goal Directed and Descriptions of Associations: Intact  Orientation:  Full (Time, Place, and Person)  Thought Content: Logical   Suicidal Thoughts:  No  Homicidal Thoughts:  No  Memory:  WNL  Judgement:  Good  Insight:  Good  Psychomotor Activity:  Normal  Concentration:  Concentration: Good  Recall:  Good  Fund of  Knowledge: Good  Language: Good  Assets:  Desire for Improvement  ADL's:  Intact  Cognition: WNL  Prognosis:  Good    DIAGNOSES:    ICD-10-CM   1. Depression, unspecified depression type  F32.9   2. PTSD (post-traumatic stress disorder)  F43.10   3. Anxiety  F41.9     Receiving Psychotherapy: No    RECOMMENDATIONS:  Continue Buspar 30 mg bid.  Continue Cymbalta 60 mg, 2 qd. Continue Gabapentin 100 mg tid prn. Return in 6 months.   Donnal Moat, PA-C

## 2019-09-08 MED FILL — MONTELUKAST SOD 10 MG TAB: 10 | 30 days supply | Qty: 30 | Fill #4

## 2019-09-08 MED FILL — DULoxetine HCL 60 MG CPEP: 60 | 30 days supply | Qty: 60 | Fill #2

## 2019-09-13 ENCOUNTER — Other Ambulatory Visit: Payer: Self-pay

## 2019-09-13 MED ORDER — BUSPIRONE HCL 30 MG PO TABS: 30.0000 mg | ORAL_TABLET | Freq: Two times a day (BID) | ORAL | 2 refills | Status: DC

## 2019-09-13 MED FILL — busPIRone HCL 30 MG TABS: 30 | 30 days supply | Qty: 60 | Fill #0

## 2019-10-10 ENCOUNTER — Other Ambulatory Visit: Payer: Self-pay | Admitting: Physician Assistant

## 2019-10-10 MED FILL — SYMBICORT 160-4.5 MCG INH: 160-4.5 | 30 days supply | Qty: 10 | Fill #0

## 2019-10-10 MED FILL — DULoxetine HCL 60 MG CPEP: 60 | 30 days supply | Qty: 60 | Fill #0

## 2019-11-20 MED FILL — DULoxetine HCL 60 MG CPEP: 60 | 30 days supply | Qty: 60 | Fill #1

## 2019-12-06 DIAGNOSIS — K219 Gastro-esophageal reflux disease without esophagitis: Secondary | ICD-10-CM | POA: Diagnosis not present

## 2019-12-06 DIAGNOSIS — J3089 Other allergic rhinitis: Secondary | ICD-10-CM | POA: Diagnosis not present

## 2019-12-06 DIAGNOSIS — J3081 Allergic rhinitis due to animal (cat) (dog) hair and dander: Secondary | ICD-10-CM | POA: Diagnosis not present

## 2019-12-06 DIAGNOSIS — J301 Allergic rhinitis due to pollen: Secondary | ICD-10-CM | POA: Diagnosis not present

## 2019-12-06 MED FILL — OLOPATADINE HCL 0.6 % SOLN: 0.6 | 30 days supply | Qty: 31 | Fill #0

## 2019-12-06 MED FILL — ALBUTEROL SULFATE HFA 108 (: 108 (90 BAS | 16 days supply | Qty: 9 | Fill #0

## 2019-12-06 MED FILL — SYMBICORT 160-4.5 MCG INH: 160-4.5 | 30 days supply | Qty: 10 | Fill #0

## 2019-12-06 MED FILL — MONTELUKAST SOD 10 MG TAB: 10 | 30 days supply | Qty: 30 | Fill #0

## 2019-12-07 DIAGNOSIS — T63441S Toxic effect of venom of bees, accidental (unintentional), sequela: Secondary | ICD-10-CM | POA: Diagnosis not present

## 2019-12-20 MED FILL — busPIRone HCL 30 MG TABS: 30 | 30 days supply | Qty: 60 | Fill #1

## 2019-12-22 ENCOUNTER — Other Ambulatory Visit: Payer: Self-pay

## 2019-12-22 MED ORDER — GABAPENTIN 100 MG PO CAPS: 100.0000 mg | ORAL_CAPSULE | Freq: Three times a day (TID) | ORAL | 1 refills | Status: DC | PRN

## 2019-12-22 MED FILL — GABAPENTIN 100 MG CAPSULE: 100 | 30 days supply | Qty: 90 | Fill #0

## 2020-01-01 MED FILL — DULoxetine HCL 60 MG CPEP: 60 | 30 days supply | Qty: 60 | Fill #2

## 2020-01-23 DIAGNOSIS — T63461D Toxic effect of venom of wasps, accidental (unintentional), subsequent encounter: Secondary | ICD-10-CM | POA: Diagnosis not present

## 2020-01-23 DIAGNOSIS — T63441D Toxic effect of venom of bees, accidental (unintentional), subsequent encounter: Secondary | ICD-10-CM | POA: Diagnosis not present

## 2020-01-23 DIAGNOSIS — T63451D Toxic effect of venom of hornets, accidental (unintentional), subsequent encounter: Secondary | ICD-10-CM | POA: Diagnosis not present

## 2020-01-30 DIAGNOSIS — T63461D Toxic effect of venom of wasps, accidental (unintentional), subsequent encounter: Secondary | ICD-10-CM | POA: Diagnosis not present

## 2020-01-30 DIAGNOSIS — T63441D Toxic effect of venom of bees, accidental (unintentional), subsequent encounter: Secondary | ICD-10-CM | POA: Diagnosis not present

## 2020-01-30 DIAGNOSIS — T63451D Toxic effect of venom of hornets, accidental (unintentional), subsequent encounter: Secondary | ICD-10-CM | POA: Diagnosis not present

## 2020-02-06 DIAGNOSIS — T63441D Toxic effect of venom of bees, accidental (unintentional), subsequent encounter: Secondary | ICD-10-CM | POA: Diagnosis not present

## 2020-02-06 DIAGNOSIS — T63461D Toxic effect of venom of wasps, accidental (unintentional), subsequent encounter: Secondary | ICD-10-CM | POA: Diagnosis not present

## 2020-02-06 DIAGNOSIS — T63451D Toxic effect of venom of hornets, accidental (unintentional), subsequent encounter: Secondary | ICD-10-CM | POA: Diagnosis not present

## 2020-02-08 ENCOUNTER — Other Ambulatory Visit: Payer: Self-pay | Admitting: Physician Assistant

## 2020-02-08 MED FILL — DULoxetine HCL 60 MG CPEP: 60 | 30 days supply | Qty: 60 | Fill #0

## 2020-02-09 ENCOUNTER — Ambulatory Visit: Payer: 59 | Admitting: Physician Assistant

## 2020-02-13 DIAGNOSIS — T63441D Toxic effect of venom of bees, accidental (unintentional), subsequent encounter: Secondary | ICD-10-CM | POA: Diagnosis not present

## 2020-02-13 DIAGNOSIS — Z91013 Allergy to seafood: Secondary | ICD-10-CM | POA: Diagnosis not present

## 2020-02-13 DIAGNOSIS — T63451D Toxic effect of venom of hornets, accidental (unintentional), subsequent encounter: Secondary | ICD-10-CM | POA: Diagnosis not present

## 2020-02-13 DIAGNOSIS — T63461D Toxic effect of venom of wasps, accidental (unintentional), subsequent encounter: Secondary | ICD-10-CM | POA: Diagnosis not present

## 2020-02-20 ENCOUNTER — Ambulatory Visit (INDEPENDENT_AMBULATORY_CARE_PROVIDER_SITE_OTHER): Payer: 59 | Admitting: Physician Assistant

## 2020-02-20 ENCOUNTER — Other Ambulatory Visit: Payer: Self-pay

## 2020-02-20 ENCOUNTER — Encounter: Payer: Self-pay | Admitting: Physician Assistant

## 2020-02-20 ENCOUNTER — Other Ambulatory Visit: Payer: Self-pay | Admitting: Physician Assistant

## 2020-02-20 DIAGNOSIS — F329 Major depressive disorder, single episode, unspecified: Secondary | ICD-10-CM

## 2020-02-20 DIAGNOSIS — T63451D Toxic effect of venom of hornets, accidental (unintentional), subsequent encounter: Secondary | ICD-10-CM | POA: Diagnosis not present

## 2020-02-20 DIAGNOSIS — T63461D Toxic effect of venom of wasps, accidental (unintentional), subsequent encounter: Secondary | ICD-10-CM | POA: Diagnosis not present

## 2020-02-20 DIAGNOSIS — F419 Anxiety disorder, unspecified: Secondary | ICD-10-CM

## 2020-02-20 DIAGNOSIS — T63441D Toxic effect of venom of bees, accidental (unintentional), subsequent encounter: Secondary | ICD-10-CM | POA: Diagnosis not present

## 2020-02-20 DIAGNOSIS — F32A Depression, unspecified: Secondary | ICD-10-CM

## 2020-02-20 MED ORDER — BUSPIRONE HCL 30 MG PO TABS: 30.0000 mg | ORAL_TABLET | Freq: Two times a day (BID) | ORAL | 5 refills | Status: DC

## 2020-02-20 MED ORDER — DULOXETINE HCL 60 MG PO CPEP: 120.0000 mg | ORAL_CAPSULE | Freq: Every day | ORAL | 5 refills | Status: DC

## 2020-02-20 MED FILL — busPIRone HCL 30 MG TABS: 30 | 30 days supply | Qty: 60 | Fill #0
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
## 2020-02-27 DIAGNOSIS — T63451D Toxic effect of venom of hornets, accidental (unintentional), subsequent encounter: Secondary | ICD-10-CM | POA: Diagnosis not present

## 2020-02-27 DIAGNOSIS — T63441D Toxic effect of venom of bees, accidental (unintentional), subsequent encounter: Secondary | ICD-10-CM | POA: Diagnosis not present

## 2020-02-27 DIAGNOSIS — T63461D Toxic effect of venom of wasps, accidental (unintentional), subsequent encounter: Secondary | ICD-10-CM | POA: Diagnosis not present

## 2020-03-05 DIAGNOSIS — T63461D Toxic effect of venom of wasps, accidental (unintentional), subsequent encounter: Secondary | ICD-10-CM | POA: Diagnosis not present

## 2020-03-05 DIAGNOSIS — T63441D Toxic effect of venom of bees, accidental (unintentional), subsequent encounter: Secondary | ICD-10-CM | POA: Diagnosis not present

## 2020-03-05 DIAGNOSIS — T63451D Toxic effect of venom of hornets, accidental (unintentional), subsequent encounter: Secondary | ICD-10-CM | POA: Diagnosis not present

## 2020-03-12 DIAGNOSIS — T781XXD Other adverse food reactions, not elsewhere classified, subsequent encounter: Secondary | ICD-10-CM | POA: Diagnosis not present

## 2020-03-12 DIAGNOSIS — T63441D Toxic effect of venom of bees, accidental (unintentional), subsequent encounter: Secondary | ICD-10-CM | POA: Diagnosis not present

## 2020-03-12 DIAGNOSIS — K219 Gastro-esophageal reflux disease without esophagitis: Secondary | ICD-10-CM | POA: Diagnosis not present

## 2020-03-12 DIAGNOSIS — J4540 Moderate persistent asthma, uncomplicated: Secondary | ICD-10-CM | POA: Diagnosis not present

## 2020-03-12 MED FILL — ALBUTEROL SULFATE HFA 108 (: 108 (90 BAS | 16 days supply | Qty: 18 | Fill #0

## 2020-03-12 MED FILL — MONTELUKAST SOD 10 MG TAB: 10 | 30 days supply | Qty: 30 | Fill #0

## 2020-03-14 MED FILL — DULoxetine HCL 60 MG CPEP: 60 | 30 days supply | Qty: 60 | Fill #0

## 2020-03-26 DIAGNOSIS — T63441D Toxic effect of venom of bees, accidental (unintentional), subsequent encounter: Secondary | ICD-10-CM | POA: Diagnosis not present

## 2020-03-26 DIAGNOSIS — T63461D Toxic effect of venom of wasps, accidental (unintentional), subsequent encounter: Secondary | ICD-10-CM | POA: Diagnosis not present

## 2020-03-26 DIAGNOSIS — T63451D Toxic effect of venom of hornets, accidental (unintentional), subsequent encounter: Secondary | ICD-10-CM | POA: Diagnosis not present

## 2020-03-28 DIAGNOSIS — R002 Palpitations: Secondary | ICD-10-CM | POA: Diagnosis not present

## 2020-03-28 DIAGNOSIS — H029 Unspecified disorder of eyelid: Secondary | ICD-10-CM | POA: Diagnosis not present

## 2020-04-02 DIAGNOSIS — T63441D Toxic effect of venom of bees, accidental (unintentional), subsequent encounter: Secondary | ICD-10-CM | POA: Diagnosis not present

## 2020-04-02 DIAGNOSIS — T63451D Toxic effect of venom of hornets, accidental (unintentional), subsequent encounter: Secondary | ICD-10-CM | POA: Diagnosis not present

## 2020-04-02 DIAGNOSIS — T63461D Toxic effect of venom of wasps, accidental (unintentional), subsequent encounter: Secondary | ICD-10-CM | POA: Diagnosis not present

## 2020-04-09 DIAGNOSIS — T63461D Toxic effect of venom of wasps, accidental (unintentional), subsequent encounter: Secondary | ICD-10-CM | POA: Diagnosis not present

## 2020-04-09 DIAGNOSIS — T63441D Toxic effect of venom of bees, accidental (unintentional), subsequent encounter: Secondary | ICD-10-CM | POA: Diagnosis not present

## 2020-04-09 DIAGNOSIS — T63451D Toxic effect of venom of hornets, accidental (unintentional), subsequent encounter: Secondary | ICD-10-CM | POA: Diagnosis not present

## 2020-04-09 MED FILL — SYMBICORT 160-4.5 MCG INH: 160-4.5 | 30 days supply | Qty: 10 | Fill #1

## 2020-04-16 DIAGNOSIS — T63461D Toxic effect of venom of wasps, accidental (unintentional), subsequent encounter: Secondary | ICD-10-CM | POA: Diagnosis not present

## 2020-04-16 DIAGNOSIS — T63441D Toxic effect of venom of bees, accidental (unintentional), subsequent encounter: Secondary | ICD-10-CM | POA: Diagnosis not present

## 2020-04-16 DIAGNOSIS — T63451D Toxic effect of venom of hornets, accidental (unintentional), subsequent encounter: Secondary | ICD-10-CM | POA: Diagnosis not present

## 2020-04-30 DIAGNOSIS — T63451D Toxic effect of venom of hornets, accidental (unintentional), subsequent encounter: Secondary | ICD-10-CM | POA: Diagnosis not present

## 2020-04-30 DIAGNOSIS — T63441D Toxic effect of venom of bees, accidental (unintentional), subsequent encounter: Secondary | ICD-10-CM | POA: Diagnosis not present

## 2020-04-30 DIAGNOSIS — T63461D Toxic effect of venom of wasps, accidental (unintentional), subsequent encounter: Secondary | ICD-10-CM | POA: Diagnosis not present

## 2020-05-15 DIAGNOSIS — T63461D Toxic effect of venom of wasps, accidental (unintentional), subsequent encounter: Secondary | ICD-10-CM | POA: Diagnosis not present

## 2020-05-15 DIAGNOSIS — T63441D Toxic effect of venom of bees, accidental (unintentional), subsequent encounter: Secondary | ICD-10-CM | POA: Diagnosis not present

## 2020-05-15 DIAGNOSIS — T63451D Toxic effect of venom of hornets, accidental (unintentional), subsequent encounter: Secondary | ICD-10-CM | POA: Diagnosis not present

## 2020-05-21 DIAGNOSIS — H524 Presbyopia: Secondary | ICD-10-CM | POA: Diagnosis not present

## 2020-05-21 DIAGNOSIS — T63441D Toxic effect of venom of bees, accidental (unintentional), subsequent encounter: Secondary | ICD-10-CM | POA: Diagnosis not present

## 2020-05-21 DIAGNOSIS — T63451D Toxic effect of venom of hornets, accidental (unintentional), subsequent encounter: Secondary | ICD-10-CM | POA: Diagnosis not present

## 2020-05-21 DIAGNOSIS — T63461D Toxic effect of venom of wasps, accidental (unintentional), subsequent encounter: Secondary | ICD-10-CM | POA: Diagnosis not present

## 2020-05-24 MED FILL — busPIRone HCL 30 MG TABS: 30 | 30 days supply | Qty: 60 | Fill #2

## 2020-05-24 MED FILL — DULoxetine HCL 60 MG CPEP: 60 | 30 days supply | Qty: 60 | Fill #2

## 2020-05-31 DIAGNOSIS — T63461D Toxic effect of venom of wasps, accidental (unintentional), subsequent encounter: Secondary | ICD-10-CM | POA: Diagnosis not present

## 2020-05-31 DIAGNOSIS — T63451D Toxic effect of venom of hornets, accidental (unintentional), subsequent encounter: Secondary | ICD-10-CM | POA: Diagnosis not present

## 2020-05-31 DIAGNOSIS — T63441D Toxic effect of venom of bees, accidental (unintentional), subsequent encounter: Secondary | ICD-10-CM | POA: Diagnosis not present

## 2020-06-06 DIAGNOSIS — T63461D Toxic effect of venom of wasps, accidental (unintentional), subsequent encounter: Secondary | ICD-10-CM | POA: Diagnosis not present

## 2020-06-06 DIAGNOSIS — N390 Urinary tract infection, site not specified: Secondary | ICD-10-CM | POA: Diagnosis not present

## 2020-06-06 DIAGNOSIS — T63441D Toxic effect of venom of bees, accidental (unintentional), subsequent encounter: Secondary | ICD-10-CM | POA: Diagnosis not present

## 2020-06-06 DIAGNOSIS — T63451D Toxic effect of venom of hornets, accidental (unintentional), subsequent encounter: Secondary | ICD-10-CM | POA: Diagnosis not present

## 2020-06-06 MED FILL — CIPROFLOXACIN HCL 500 MG TA: 500 | 5 days supply | Qty: 10 | Fill #0

## 2020-06-12 DIAGNOSIS — T63451D Toxic effect of venom of hornets, accidental (unintentional), subsequent encounter: Secondary | ICD-10-CM | POA: Diagnosis not present

## 2020-06-12 DIAGNOSIS — T63441D Toxic effect of venom of bees, accidental (unintentional), subsequent encounter: Secondary | ICD-10-CM | POA: Diagnosis not present

## 2020-06-12 DIAGNOSIS — T63461D Toxic effect of venom of wasps, accidental (unintentional), subsequent encounter: Secondary | ICD-10-CM | POA: Diagnosis not present

## 2020-06-21 DIAGNOSIS — T63451D Toxic effect of venom of hornets, accidental (unintentional), subsequent encounter: Secondary | ICD-10-CM | POA: Diagnosis not present

## 2020-06-21 DIAGNOSIS — T63441D Toxic effect of venom of bees, accidental (unintentional), subsequent encounter: Secondary | ICD-10-CM | POA: Diagnosis not present

## 2020-06-21 DIAGNOSIS — T63461D Toxic effect of venom of wasps, accidental (unintentional), subsequent encounter: Secondary | ICD-10-CM | POA: Diagnosis not present

## 2020-06-26 DIAGNOSIS — T63451D Toxic effect of venom of hornets, accidental (unintentional), subsequent encounter: Secondary | ICD-10-CM | POA: Diagnosis not present

## 2020-06-26 DIAGNOSIS — T63461D Toxic effect of venom of wasps, accidental (unintentional), subsequent encounter: Secondary | ICD-10-CM | POA: Diagnosis not present

## 2020-06-26 DIAGNOSIS — T63441D Toxic effect of venom of bees, accidental (unintentional), subsequent encounter: Secondary | ICD-10-CM | POA: Diagnosis not present

## 2020-07-03 DIAGNOSIS — T63441D Toxic effect of venom of bees, accidental (unintentional), subsequent encounter: Secondary | ICD-10-CM | POA: Diagnosis not present

## 2020-07-03 DIAGNOSIS — T63451D Toxic effect of venom of hornets, accidental (unintentional), subsequent encounter: Secondary | ICD-10-CM | POA: Diagnosis not present

## 2020-07-03 DIAGNOSIS — T63461D Toxic effect of venom of wasps, accidental (unintentional), subsequent encounter: Secondary | ICD-10-CM | POA: Diagnosis not present

## 2020-07-03 MED FILL — DULoxetine HCL 60 MG CPEP: 60 | 30 days supply | Qty: 60 | Fill #3

## 2020-07-08 DIAGNOSIS — Z01419 Encounter for gynecological examination (general) (routine) without abnormal findings: Secondary | ICD-10-CM | POA: Diagnosis not present

## 2020-07-08 DIAGNOSIS — N951 Menopausal and female climacteric states: Secondary | ICD-10-CM | POA: Diagnosis not present

## 2020-07-16 DIAGNOSIS — T63441D Toxic effect of venom of bees, accidental (unintentional), subsequent encounter: Secondary | ICD-10-CM | POA: Diagnosis not present

## 2020-07-16 DIAGNOSIS — T63451D Toxic effect of venom of hornets, accidental (unintentional), subsequent encounter: Secondary | ICD-10-CM | POA: Diagnosis not present

## 2020-07-16 DIAGNOSIS — T63461D Toxic effect of venom of wasps, accidental (unintentional), subsequent encounter: Secondary | ICD-10-CM | POA: Diagnosis not present

## 2020-07-30 MED FILL — SYMBICORT 160-4.5 MCG INH: 160-4.5 | 30 days supply | Qty: 10 | Fill #2

## 2020-07-31 DIAGNOSIS — R350 Frequency of micturition: Secondary | ICD-10-CM | POA: Diagnosis not present

## 2020-07-31 DIAGNOSIS — R102 Pelvic and perineal pain: Secondary | ICD-10-CM | POA: Diagnosis not present

## 2020-07-31 DIAGNOSIS — N3001 Acute cystitis with hematuria: Secondary | ICD-10-CM | POA: Diagnosis not present

## 2020-07-31 MED FILL — NITROFURANTOIN MONO-MCR 100: 100 | 5 days supply | Qty: 10 | Fill #0

## 2020-08-05 MED FILL — DULoxetine HCL 60 MG CPEP: 60 | 30 days supply | Qty: 60 | Fill #4

## 2020-08-06 DIAGNOSIS — T63441D Toxic effect of venom of bees, accidental (unintentional), subsequent encounter: Secondary | ICD-10-CM | POA: Diagnosis not present

## 2020-08-06 DIAGNOSIS — T63451D Toxic effect of venom of hornets, accidental (unintentional), subsequent encounter: Secondary | ICD-10-CM | POA: Diagnosis not present

## 2020-08-06 DIAGNOSIS — T63461D Toxic effect of venom of wasps, accidental (unintentional), subsequent encounter: Secondary | ICD-10-CM | POA: Diagnosis not present

## 2020-08-06 MED FILL — EPINEPHRINE 0.3 MG AUTO-INJ: 0.3 | 15 days supply | Qty: 2 | Fill #0

## 2020-08-06 MED FILL — ALBUTEROL 0.083% INHAL SOLN: (2.5 MG/3ML | 12 days supply | Qty: 225 | Fill #0

## 2020-08-14 ENCOUNTER — Other Ambulatory Visit: Payer: Self-pay | Admitting: Obstetrics & Gynecology

## 2020-08-14 DIAGNOSIS — N63 Unspecified lump in unspecified breast: Secondary | ICD-10-CM

## 2020-08-15 DIAGNOSIS — N3001 Acute cystitis with hematuria: Secondary | ICD-10-CM | POA: Diagnosis not present

## 2020-08-20 ENCOUNTER — Telehealth (INDEPENDENT_AMBULATORY_CARE_PROVIDER_SITE_OTHER): Payer: 59 | Admitting: Physician Assistant

## 2020-08-20 ENCOUNTER — Other Ambulatory Visit: Payer: Self-pay | Admitting: Physician Assistant

## 2020-08-20 ENCOUNTER — Encounter: Payer: Self-pay | Admitting: Physician Assistant

## 2020-08-20 ENCOUNTER — Telehealth: Payer: Self-pay | Admitting: Physician Assistant

## 2020-08-20 DIAGNOSIS — Z1159 Encounter for screening for other viral diseases: Secondary | ICD-10-CM | POA: Diagnosis not present

## 2020-08-20 DIAGNOSIS — F411 Generalized anxiety disorder: Secondary | ICD-10-CM | POA: Diagnosis not present

## 2020-08-20 DIAGNOSIS — F3342 Major depressive disorder, recurrent, in full remission: Secondary | ICD-10-CM

## 2020-08-20 MED ORDER — DULOXETINE HCL 60 MG PO CPEP: 120.0000 mg | ORAL_CAPSULE | Freq: Every day | ORAL | 5 refills | Status: DC

## 2020-08-20 MED ORDER — BUSPIRONE HCL 30 MG PO TABS: 30.0000 mg | ORAL_TABLET | Freq: Two times a day (BID) | ORAL | 5 refills | Status: DC

## 2020-08-20 MED FILL — busPIRone HCL 30 MG TABS: 30 | 30 days supply | Qty: 60 | Fill #0

## 2020-08-20 NOTE — Telephone Encounter (Signed)
Ms. aeon, koors are scheduled for a virtual visit with your provider today.    Just as we do with appointments in the office, we must obtain your consent to participate.  Your consent will be active for this visit and any virtual visit you may have with one of our providers in the next 365 days.    If you have a MyChart account, I can also send a copy of this consent to you electronically.  All virtual visits are billed to your insurance company just like a traditional visit in the office.  As this is a virtual visit, video technology does not allow for your provider to perform a traditional examination.  This may limit your provider's ability to fully assess your condition.  If your provider identifies any concerns that need to be evaluated in person or the need to arrange testing such as labs, EKG, etc, we will make arrangements to do so.    Although advances in technology are sophisticated, we cannot ensure that it will always work on either your end or our end.  If the connection with a video visit is poor, we may have to switch to a telephone visit.  With either a video or telephone visit, we are not always able to ensure that we have a secure connection.   I need to obtain your verbal consent now.   Are you willing to proceed with your visit today?   Alyannah Sanks has provided verbal consent on 08/20/2020 for a virtual visit (video or telephone).   Donnal Moat, PA-C 08/20/2020  1:35 PM

## 2020-08-20 NOTE — Progress Notes (Signed)
[facility] Check  Patient ID: Jennifer Davis,  MRN: [id number]  PCP: Wenda Low, MD  Date of Evaluation: 08/20/2020 Time spent:20 minutes   Chief Complaint:  Chief Complaint    Anxiety; Depression     Virtual Visit via Telehealth  I connected with patient by a video enabled telemedicine application with their informed consent, and verified patient privacy and that I am speaking with the correct person using two identifiers.  I am private, in my office and the patient is at work.  I discussed the limitations, risks, security and privacy concerns of performing an evaluation and management service by video and the availability of in person appointments. I also discussed with the patient that there may be a patient responsible charge related to this service. The patient expressed understanding and agreed to proceed.   I discussed the assessment and treatment plan with the patient. The patient was provided an opportunity to ask questions and all were answered. The patient agreed with the plan and demonstrated an understanding of the instructions.   The patient was advised to call back or seek an in-person evaluation if the symptoms worsen or if the condition fails to improve as anticipated.  I provided 20  minutes of non-face-to-face time during this encounter.  HISTORY/CURRENT STATUS: HPI For routine med check.   Doing well. Feels that her meds are working just fine.   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  Is still working on pre-reqs for nursing school, working on Armed forces training and education officer with schoolwork, work, family.  It is difficult sometimes but she is managing.  She's rarely takes the Gabapentin now.  She does have it if needed.  She was taking it sometimes to help sleep but now she is taking the melatonin when she needs something.  And that is not every night.   Patient denies loss of interest in usual activities and is able to enjoy things.  Denies  decreased energy or motivation.  Appetite has not changed.  No extreme sadness, tearfulness, or feelings of hopelessness. Denies suicidal or homicidal thoughts.  Denies dizziness, syncope, seizures, numbness, tingling, tremor, tics, unsteady gait, slurred speech, confusion. Denies muscle or joint pain, stiffness, or dystonia.  Individual Medical History/ Review of Systems: Changes? :No    Past medications for mental health diagnoses include: Lexapro, Prozac wasn't effective, Xanax  Allergies: Patient has no known allergies.  Current Medications:  Current Outpatient Medications:  .  busPIRone (BUSPAR) 30 MG tablet, Take 1 tablet (30 mg total) by mouth 2 (two) times daily., Disp: 60 tablet, Rfl: 5 .  cetirizine (ZYRTEC) 10 MG tablet, Take 10 mg by mouth daily., Disp: , Rfl:  .  DULoxetine (CYMBALTA) 60 MG capsule, Take 2 capsules (120 mg total) by mouth daily., Disp: 60 capsule, Rfl: 5 .  EPINEPHrine (EPIPEN 2-PAK) 0.3 mg/0.3 mL IJ SOAJ injection, Inject 0.3 mLs (0.3 mg total) into the muscle once as needed for up to 1 dose (for severe allergic reaction). CAll 911 immediately if you have to use this medicine, Disp: 1 each, Rfl: 1 .  gabapentin (NEURONTIN) 100 MG capsule, Take 1 capsule (100 mg total) by mouth 3 (three) times daily as needed., Disp: 90 capsule, Rfl: 1 .  melatonin 5 MG TABS, Take 5 mg by mouth at bedtime as needed., Disp: , Rfl:  .  montelukast (SINGULAIR) 10 MG tablet, Take 1 tablet by mouth daily., Disp: , Rfl: 5 .  Multiple Vitamins-Minerals (MULTIVITAMIN PO), Take 2 tablets by  mouth daily. Skin/hair/nail supplement., Disp: , Rfl:  .  PROAIR HFA 108 (90 Base) MCG/ACT inhaler, Inhale 2 puffs into the lungs as needed., Disp: , Rfl: 0 .  SYMBICORT 160-4.5 MCG/ACT inhaler, Inhale 2 puffs into the lungs 2 (two) times daily., Disp: , Rfl: 5 .  APPLE CIDER VINEGAR PO, Take by mouth. (Patient not taking: Reported on 08/20/2020), Disp: , Rfl:  .  diphenhydrAMINE (SOMINEX) 25 MG  tablet, Take 25 mg by mouth 4 (four) times daily as needed for sleep. (Patient not taking: Reported on 08/20/2020), Disp: , Rfl:  Medication Side Effects: none  Family Medical/ Social History: Changes? No  MENTAL HEALTH EXAM:  There were no vitals taken for this visit.There is no height or weight on file to calculate BMI.  General Appearance: unable to assess  Eye Contact:  unable to assess  Speech:  Clear and Coherent and Normal Rate  Volume:  Normal  Mood:  Euthymic  Affect:  unable to assess  Thought Process:  Goal Directed and Descriptions of Associations: Intact  Orientation:  Full (Time, Place, and Person)  Thought Content: Logical   Suicidal Thoughts:  No  Homicidal Thoughts:  No  Memory:  WNL  Judgement:  Good  Insight:  Good  Psychomotor Activity:  unable to assess  Concentration:  Concentration: Good and Attention Span: Good  Recall:  Good  Fund of Knowledge: Good  Language: Good  Assets:  Desire for Improvement  ADL's:  Intact  Cognition: WNL  Prognosis:  Good    DIAGNOSES:    ICD-10-CM   1. Generalized anxiety disorder  F41.1   2. Recurrent major depressive disorder, in full remission ([place])  F33.42     Receiving Psychotherapy: No    RECOMMENDATIONS:  PDMP reviewed. I provided 20 minutes of nonface-to-face time during this encounter. I am glad to see her doing so well. Continue Buspar 30 mg bid.  Continue Cymbalta 60 mg, 2 qd. Continue Gabapentin 100 mg tid prn. Return in 6 months.   Donnal Moat, PA-C

## 2020-08-27 ENCOUNTER — Other Ambulatory Visit: Payer: Self-pay | Admitting: Physician Assistant

## 2020-08-27 ENCOUNTER — Telehealth: Payer: 59 | Admitting: Physician Assistant

## 2020-08-27 DIAGNOSIS — R059 Cough, unspecified: Secondary | ICD-10-CM | POA: Diagnosis not present

## 2020-08-27 MED ORDER — BENZONATATE 100 MG PO CAPS: 100.0000 mg | ORAL_CAPSULE | Freq: Three times a day (TID) | ORAL | 0 refills | Status: DC

## 2020-08-27 MED ORDER — PREDNISONE 50 MG PO TABS: 50.0000 mg | ORAL_TABLET | Freq: Every day | ORAL | 0 refills | Status: DC

## 2020-08-27 MED ORDER — AZITHROMYCIN 250 MG PO TABS: 250.0000 mg | ORAL_TABLET | Freq: Every day | ORAL | 0 refills | Status: DC

## 2020-08-27 MED FILL — predniSONE 50 MG TABS: 50 | 5 days supply | Qty: 5 | Fill #0

## 2020-08-27 MED FILL — AZITHROMYCIN 250 MG TABLET: 250 | 5 days supply | Qty: 6 | Fill #0

## 2020-08-27 MED FILL — BENZONATATE 100 MG CAPS: 100 | 5 days supply | Qty: 15 | Fill #0

## 2020-08-27 NOTE — Progress Notes (Signed)
We are sorry that you are not feeling well.  Here is how we plan to help!  Based on your presentation I believe you most likely have A cough due to bronchitis. Bronchitis is typically a viral infection and is best treated by rest, plenty of fluids and control of the cough.  You may use Ibuprofen or Tylenol as directed to help your symptoms.     In addition you may use A prescription cough medication called Tessalon Perles 100mg . You may take 1-2 capsules every 8 hours as needed for your cough.  I have prescribed a steroid called Prednisone as well as an antibiotic Azithromycin to cover for potential development into a bacterial upper respiratory infection.  From your responses in the eVisit questionnaire you describe inflammation in the upper respiratory tract which is causing a significant cough.  This is commonly called Bronchitis and has four common causes:    Allergies  Viral Infections  Acid Reflux  Bacterial Infection Allergies, viruses and acid reflux are treated by controlling symptoms or eliminating the cause. An example might be a cough caused by taking certain blood pressure medications. You stop the cough by changing the medication. Another example might be a cough caused by acid reflux. Controlling the reflux helps control the cough.  USE OF BRONCHODILATOR ("RESCUE") INHALERS: There is a risk from using your bronchodilator too frequently.  The risk is that over-reliance on a medication which only relaxes the muscles surrounding the breathing tubes can reduce the effectiveness of medications prescribed to reduce swelling and congestion of the tubes themselves.  Although you feel brief relief from the bronchodilator inhaler, your asthma may actually be worsening with the tubes becoming more swollen and filled with mucus.  This can delay other crucial treatments, such as oral steroid medications. If you need to use a bronchodilator inhaler daily, several times per day, you should  discuss this with your provider.  There are probably better treatments that could be used to keep your asthma under control.     HOME CARE . Only take medications as instructed by your medical team. . Complete the entire course of an antibiotic. . Drink plenty of fluids and get plenty of rest. . Avoid close contacts especially the very young and the elderly . Cover your mouth if you cough or cough into your sleeve. . Always remember to wash your hands . A steam or ultrasonic humidifier can help congestion.   GET HELP RIGHT AWAY IF: . You develop worsening fever. . You become short of breath . You cough up blood. . Your symptoms persist after you have completed your treatment plan MAKE SURE YOU   Understand these instructions.  Will watch your condition.  Will get help right away if you are not doing well or get worse.  Your e-visit answers were reviewed by a board certified advanced clinical practitioner to complete your personal care plan.  Depending on the condition, your plan could have included both over the counter or prescription medications. If there is a problem please reply  once you have received a response from your provider. Your safety is important to [place].  If you have drug allergies check your prescription carefully.    You can use MyChart to ask questions about today's visit, request a non-urgent call back, or ask for a work or school excuse for 24 hours related to this e-Visit. If it has been greater than 24 hours you will need to follow up with your provider, or enter  a new e-Visit to address those concerns. You will get an e-mail in the next two days asking about your experience.  I hope that your e-visit has been valuable and will speed your recovery. Thank you for using e-visits.  Approximately 5 minutes was spent documenting and reviewing patient's chart.

## 2020-08-28 ENCOUNTER — Ambulatory Visit
Admission: RE | Admit: 2020-08-28 | Discharge: 2020-08-28 | Disposition: A | Discharge status: Home / Self Care | Payer: 59 | Source: Ambulatory Visit | Attending: Obstetrics & Gynecology | Admitting: Obstetrics & Gynecology

## 2020-08-28 ENCOUNTER — Other Ambulatory Visit: Payer: Self-pay | Admitting: Obstetrics & Gynecology

## 2020-08-28 ENCOUNTER — Other Ambulatory Visit: Payer: Self-pay

## 2020-08-28 DIAGNOSIS — N6002 Solitary cyst of left breast: Secondary | ICD-10-CM | POA: Diagnosis not present

## 2020-08-28 DIAGNOSIS — N63 Unspecified lump in unspecified breast: Secondary | ICD-10-CM

## 2020-08-28 DIAGNOSIS — N6311 Unspecified lump in the right breast, upper outer quadrant: Secondary | ICD-10-CM | POA: Diagnosis not present

## 2020-08-28 DIAGNOSIS — R922 Inconclusive mammogram: Secondary | ICD-10-CM | POA: Diagnosis not present

## 2020-09-03 DIAGNOSIS — T63461D Toxic effect of venom of wasps, accidental (unintentional), subsequent encounter: Secondary | ICD-10-CM | POA: Diagnosis not present

## 2020-09-03 DIAGNOSIS — T63441D Toxic effect of venom of bees, accidental (unintentional), subsequent encounter: Secondary | ICD-10-CM | POA: Diagnosis not present

## 2020-09-03 DIAGNOSIS — T63451D Toxic effect of venom of hornets, accidental (unintentional), subsequent encounter: Secondary | ICD-10-CM | POA: Diagnosis not present

## 2020-09-10 MED FILL — DULoxetine HCL 60 MG CPEP: 60 | 30 days supply | Qty: 60 | Fill #5

## 2020-09-17 ENCOUNTER — Ambulatory Visit
Admission: RE | Admit: 2020-09-17 | Discharge: 2020-09-17 | Disposition: A | Discharge status: Home / Self Care | Payer: 59 | Source: Ambulatory Visit | Attending: Obstetrics & Gynecology | Admitting: Obstetrics & Gynecology

## 2020-09-17 ENCOUNTER — Other Ambulatory Visit: Payer: Self-pay | Admitting: Obstetrics & Gynecology

## 2020-09-17 ENCOUNTER — Other Ambulatory Visit: Payer: Self-pay

## 2020-09-17 DIAGNOSIS — N63 Unspecified lump in unspecified breast: Secondary | ICD-10-CM

## 2020-09-17 DIAGNOSIS — C50412 Malignant neoplasm of upper-outer quadrant of left female breast: Secondary | ICD-10-CM | POA: Diagnosis not present

## 2020-09-17 DIAGNOSIS — R590 Localized enlarged lymph nodes: Secondary | ICD-10-CM | POA: Diagnosis not present

## 2020-09-17 DIAGNOSIS — N6321 Unspecified lump in the left breast, upper outer quadrant: Secondary | ICD-10-CM | POA: Diagnosis not present

## 2020-09-17 DIAGNOSIS — C773 Secondary and unspecified malignant neoplasm of axilla and upper limb lymph nodes: Secondary | ICD-10-CM | POA: Diagnosis not present

## 2020-09-18 ENCOUNTER — Encounter: Payer: Self-pay | Admitting: *Deleted

## 2020-09-19 ENCOUNTER — Telehealth: Payer: Self-pay | Admitting: Hematology and Oncology

## 2020-09-19 ENCOUNTER — Other Ambulatory Visit: Payer: Self-pay | Admitting: *Deleted

## 2020-09-19 DIAGNOSIS — C50412 Malignant neoplasm of upper-outer quadrant of left female breast: Secondary | ICD-10-CM

## 2020-09-19 DIAGNOSIS — Z170 Estrogen receptor positive status [ER+]: Secondary | ICD-10-CM | POA: Insufficient documentation

## 2020-09-19 NOTE — Telephone Encounter (Signed)
Spoke to patient to confirm AM appointment for 11/17, packet will be sent via e-mail, also explained surgeons phone call

## 2020-09-24 NOTE — Progress Notes (Signed)
[facility] NOTE  Patient Care Team: Wenda Low, MD as PCP - General (Internal Medicine) Rockwell Germany, RN as Oncology Nurse Navigator Mauro Kaufmann, RN as Oncology Nurse Navigator Coralie Keens, MD as Consulting Physician (General Surgery) Nicholas Lose, MD as Consulting Physician (Hematology and Oncology) Gery Pray, MD as Consulting Physician (Radiation Oncology)  CHIEF COMPLAINTS/PURPOSE OF CONSULTATION:  Newly diagnosed breast cancer  HISTORY OF PRESENTING ILLNESS:  Jennifer Davis 44 y.o. female is here because of recent diagnosis of invasive ductal carcinoma of the left breast. Mammogram in 07/2018 showed probably benign bilateral breast masses that were stable on her last mammogram on 07/26/19. Mammogram on 08/28/20 showed the stable bilateral masses, a new 0.9cm mass at the 1 o'clock position in the left breast, and one abnormal left axillary lymph node with 0.6cm cortical thickening. Biopsy on 09/17/20 showed invasive and in situ ductal carcinoma in the breast and axilla, grade 2, HER-2 equivocal by IHC (2+), ER+ 90%, PR+ 60%, Ki67 25%. She presents to the clinic today for initial evaluation and discussion of treatment options.   I reviewed her records extensively and collaborated the history with the patient.  SUMMARY OF ONCOLOGIC HISTORY: Oncology History  Malignant neoplasm of upper-outer quadrant of left breast in female, estrogen receptor positive ([place])  09/19/2020 Initial Diagnosis   Mammogram in 07/2018 showed probably benign bilateral breast masses that were stable on her last mammogram on 07/26/19. Mammogram on 08/28/20 showed the stable bilateral masses, a new 0.9cm mass at the 1 o'clock position in the left breast, and one abnormal left axillary lymph node with 0.6cm cortical thickening. 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       MEDICAL HISTORY:  Past Medical History:  Diagnosis Date  . Anxiety   . Asthma   . Breast cancer [facility])     SURGICAL HISTORY: Past Surgical History:  Procedure Laterality Date  . CESAREAN SECTION     x3    SOCIAL HISTORY: Social History   Socioeconomic History  . Marital status: Married    Spouse name: Not on file  . Number of children: 3  . Years of education: associates  . Highest education level: Not on file  Occupational History  . Occupation: Hydrologist: [employer]  Tobacco Use  . Smoking status: Former Smoker    Types: Cigarettes    Quit date: 01/09/2012    Years since quitting: 8.7  . Smokeless tobacco: Never Used  . Tobacco comment: social smoker  Substance and Sexual Activity  . Alcohol use: Not Currently    Alcohol/week: 0.0 standard drinks    Comment: very rare  . Drug use: Never  . Sexual activity: Not on file  Other Topics Concern  . Not on file  Social History Narrative   epworth sleepiness scale = 10 (01/09/2016)   Social Determinants of Health   Financial Resource Strain:   . Difficulty of Paying Living Expenses: Not on file  Food Insecurity:   . Worried About Charity fundraiser in the Last Year: Not on file  . Ran Out of Food in the Last Year: Not on file  Transportation Needs:   . Lack of Transportation (Medical): Not on file  . Lack of Transportation (Non-Medical): Not on file  Physical Activity:   . Days of Exercise per Week: Not on file  .  Minutes of Exercise per Session: Not on file  Stress:   . Feeling of Stress : Not on file  Social Connections:   . Frequency of Communication with Friends and Family: Not on file  . Frequency of Social Gatherings with Friends and Family: Not on file  . Attends Religious Services: Not on file  . Active Member of Clubs or Organizations: Not on file  . Attends Archivist Meetings: Not on file  . Marital Status: Not on file  Intimate Partner Violence:   . Fear of  Current or Ex-Partner: Not on file  . Emotionally Abused: Not on file  . Physically Abused: Not on file  . Sexually Abused: Not on file    FAMILY HISTORY: Family History  Problem Relation Age of Onset  . Hypertension Mother   . Fibromyalgia Mother   . Colon cancer Mother   . Asthma Sister        x2  . Yves Dill Parkinson White syndrome Maternal Aunt 51  . Hypertension Maternal Grandmother   . Hypertension Maternal Grandfather   . Cancer - Lung Maternal Grandfather   . Stroke Maternal Grandfather   . Breast cancer Neg Hx     ALLERGIES:  has No Known Allergies.  MEDICATIONS:  Current Outpatient Medications  Medication Sig Dispense Refill  . busPIRone (BUSPAR) 30 MG tablet Take 1 tablet (30 mg total) by mouth 2 (two) times daily. 60 tablet 5  . cetirizine (ZYRTEC) 10 MG tablet Take 10 mg by mouth daily.    . DULoxetine (CYMBALTA) 60 MG capsule Take 2 capsules (120 mg total) by mouth daily. 60 capsule 5  . gabapentin (NEURONTIN) 100 MG capsule Take 1 capsule (100 mg total) by mouth 3 (three) times daily as needed. 90 capsule 1  . melatonin 5 MG TABS Take 5 mg by mouth at bedtime as needed.    . montelukast (SINGULAIR) 10 MG tablet Take 1 tablet by mouth daily.  5  . Multiple Vitamins-Minerals (MULTIVITAMIN PO) Take 2 tablets by mouth daily. Skin/hair/nail supplement.    Marland Kitchen PROAIR HFA 108 (90 Base) MCG/ACT inhaler Inhale 2 puffs into the lungs as needed.  0  . SYMBICORT 160-4.5 MCG/ACT inhaler Inhale 2 puffs into the lungs 2 (two) times daily.  5  . azithromycin (ZITHROMAX Z-PAK) 250 MG tablet Take 1 tablet (250 mg total) by mouth daily. Take 2 tablets on the first day of treatment. Then take 1 tablet per day for the next four days. 6 tablet 0  . EPINEPHrine (EPIPEN 2-PAK) 0.3 mg/0.3 mL IJ SOAJ injection Inject 0.3 mLs (0.3 mg total) into the muscle once as needed for up to 1 dose (for severe allergic reaction). CAll 911 immediately if you have to use this medicine 1 each 1   No current  facility-administered medications for this visit.    REVIEW OF SYSTEMS:     All other systems were reviewed with the patient and are negative.  PHYSICAL EXAMINATION: ECOG PERFORMANCE STATUS: 1 - Symptomatic but completely ambulatory  Vitals:   09/25/20 0914  BP: 136/77  Pulse: 98  Resp: 18  Temp: 99 F (37.2 C)  SpO2: 96%   Filed Weights   09/25/20 0914  Weight: 178 lb (80.7 kg)      LABORATORY DATA:  I have reviewed the data as listed Lab Results  Component Value Date   WBC 9.9 09/25/2020   HGB 11.7 (L) 09/25/2020   HCT 36.3 09/25/2020   MCV 86.4 09/25/2020  PLT 426 (H) 09/25/2020   Lab Results  Component Value Date   NA 139 09/25/2020   K 3.7 09/25/2020   CL 106 09/25/2020   CO2 22 09/25/2020    RADIOGRAPHIC STUDIES: I have personally reviewed the radiological reports and agreed with the findings in the report.  ASSESSMENT AND PLAN:  Malignant neoplasm of upper-outer quadrant of left breast in female, estrogen receptor positive ([place]) 09/20/2019: Screening mammogram on 08/28/20 showed the stable bilateral masses, a new 0.9cm mass at the 1 o'clock position in the left breast, and one abnormal left axillary lymph node with 0.6cm cortical thickening. 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   Pathology and radiology counseling: Discussed with the patient, the details of pathology including the type of breast cancer,the clinical staging, the significance of ER, PR and HER-2/neu receptors and the implications for treatment. After reviewing the pathology in detail, we proceeded to discuss the different treatment options between surgery, radiation, chemotherapy, antiestrogen therapies.  Treatment plan: 1.  Breast conserving surgery with targeted node dissection 2. MammaPrint testing on the biopsy to determine if she needs chemotherapy (that can guide [place] port  placement) 3.  Adjuvant radiation 4.  Followed by adjuvant antiestrogen therapy  Return to clinic after surgery to discuss the final pathology report. 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 The patient knows to call the clinic with any problems, questions or concerns.   Rulon Eisenmenger, MD, MPH 09/25/2020    I, Molly Dorshimer, am acting as scribe for Nicholas Lose, MD.  I have reviewed the above documentation for accuracy and completeness, and I agree with the above.

## 2020-09-25 ENCOUNTER — Ambulatory Visit: Payer: 59 | Attending: Surgery | Admitting: Physical Therapy

## 2020-09-25 ENCOUNTER — Encounter: Payer: Self-pay | Admitting: Genetic Counselor

## 2020-09-25 ENCOUNTER — Ambulatory Visit
Admission: RE | Admit: 2020-09-25 | Discharge: 2020-09-25 | Disposition: A | Discharge status: Home / Self Care | Payer: 59 | Source: Ambulatory Visit | Attending: Radiation Oncology | Admitting: Radiation Oncology

## 2020-09-25 ENCOUNTER — Other Ambulatory Visit: Payer: Self-pay | Admitting: *Deleted

## 2020-09-25 ENCOUNTER — Other Ambulatory Visit: Payer: Self-pay

## 2020-09-25 ENCOUNTER — Other Ambulatory Visit: Payer: Self-pay | Admitting: Surgery

## 2020-09-25 ENCOUNTER — Inpatient Hospital Stay (HOSPITAL_BASED_OUTPATIENT_CLINIC_OR_DEPARTMENT_OTHER): Payer: 59 | Admitting: Hematology and Oncology

## 2020-09-25 ENCOUNTER — Encounter: Payer: Self-pay | Admitting: Physical Therapy

## 2020-09-25 ENCOUNTER — Ambulatory Visit (HOSPITAL_BASED_OUTPATIENT_CLINIC_OR_DEPARTMENT_OTHER): Payer: 59 | Admitting: Genetic Counselor

## 2020-09-25 ENCOUNTER — Inpatient Hospital Stay: Payer: 59 | Admitting: Licensed Clinical Social Worker

## 2020-09-25 ENCOUNTER — Encounter: Payer: Self-pay | Admitting: Hematology and Oncology

## 2020-09-25 ENCOUNTER — Inpatient Hospital Stay: Payer: 59

## 2020-09-25 DIAGNOSIS — R293 Abnormal posture: Secondary | ICD-10-CM | POA: Insufficient documentation

## 2020-09-25 DIAGNOSIS — Z79899 Other long term (current) drug therapy: Secondary | ICD-10-CM | POA: Insufficient documentation

## 2020-09-25 DIAGNOSIS — F419 Anxiety disorder, unspecified: Secondary | ICD-10-CM

## 2020-09-25 DIAGNOSIS — Z87891 Personal history of nicotine dependence: Secondary | ICD-10-CM

## 2020-09-25 DIAGNOSIS — Z170 Estrogen receptor positive status [ER+]: Secondary | ICD-10-CM | POA: Insufficient documentation

## 2020-09-25 DIAGNOSIS — Z808 Family history of malignant neoplasm of other organs or systems: Secondary | ICD-10-CM | POA: Diagnosis not present

## 2020-09-25 DIAGNOSIS — C50412 Malignant neoplasm of upper-outer quadrant of left female breast: Secondary | ICD-10-CM | POA: Insufficient documentation

## 2020-09-25 DIAGNOSIS — C773 Secondary and unspecified malignant neoplasm of axilla and upper limb lymph nodes: Secondary | ICD-10-CM | POA: Insufficient documentation

## 2020-09-25 DIAGNOSIS — D0512 Intraductal carcinoma in situ of left breast: Secondary | ICD-10-CM | POA: Diagnosis not present

## 2020-09-25 DIAGNOSIS — Z800 Family history of malignant neoplasm of digestive organs: Secondary | ICD-10-CM | POA: Diagnosis not present

## 2020-09-25 DIAGNOSIS — C50912 Malignant neoplasm of unspecified site of left female breast: Secondary | ICD-10-CM

## 2020-09-25 LAB — CMP (CANCER CENTER ONLY)
ALT: 13 U/L (ref 0–44)
AST: 13 U/L — ABNORMAL LOW (ref 15–41)
Albumin: 3.7 g/dL (ref 3.5–5.0)
Alkaline Phosphatase: 79 U/L (ref 38–126)
Anion gap: 11 (ref 5–15)
BUN: 8 mg/dL (ref 6–20)
CO2: 22 mmol/L (ref 22–32)
Calcium: 8.7 mg/dL — ABNORMAL LOW (ref 8.9–10.3)
Chloride: 106 mmol/L (ref 98–111)
Creatinine: 0.82 mg/dL (ref 0.44–1.00)
GFR, Estimated: >60 mL/min (ref >60)
Glucose, Bld: 143 mg/dL — ABNORMAL HIGH (ref 70–99)
Potassium: 3.7 mmol/L (ref 3.5–5.1)
Sodium: 139 mmol/L (ref 135–145)
Total Bilirubin: 0.3 mg/dL (ref 0.3–1.2)
Total Protein: 7 g/dL (ref 6.5–8.1)

## 2020-09-25 LAB — CBC WITH DIFFERENTIAL (CANCER CENTER ONLY)
Abs Immature Granulocytes: 0.04 10*3/uL (ref 0.00–0.07)
Basophils Absolute: 0 10*3/uL (ref 0.0–0.1)
Basophils Relative: 0 %
Eosinophils Absolute: 0.3 10*3/uL (ref 0.0–0.5)
Eosinophils Relative: 3 %
HCT: 36.3 % (ref 36.0–46.0)
Hemoglobin: 11.7 g/dL — ABNORMAL LOW (ref 12.0–15.0)
Immature Granulocytes: 0 %
Lymphocytes Relative: 30 %
Lymphs Abs: 3 10*3/uL (ref 0.7–4.0)
MCH: 27.9 pg (ref 26.0–34.0)
MCHC: 32.2 g/dL (ref 30.0–36.0)
MCV: 86.4 fL (ref 80.0–100.0)
Monocytes Absolute: 0.4 10*3/uL (ref 0.1–1.0)
Monocytes Relative: 4 %
Neutro Abs: 6.1 10*3/uL (ref 1.7–7.7)
Neutrophils Relative %: 63 %
Platelet Count: 426 10*3/uL — ABNORMAL HIGH (ref 150–400)
RBC: 4.2 MIL/uL (ref 3.87–5.11)
RDW: 14.3 % (ref 11.5–15.5)
WBC Count: 9.9 10*3/uL (ref 4.0–10.5)
nRBC: 0 % (ref 0.0–0.2)

## 2020-09-25 LAB — GENETIC SCREENING ORDER

## 2020-09-25 NOTE — Progress Notes (Signed)
[place] [facility] Work  Initial Assessment   Jennifer Davis is a 44 y.o. year old female accompanied by patient and husband, Jennifer Davis). Clinical Social Work was referred by [facility] for assessment of psychosocial needs.   SDOH (Social Determinants of Health) assessments performed: Yes SDOH Interventions     Most Recent Value  SDOH Interventions  Food Insecurity Interventions Intervention Not Indicated  Housing Interventions Intervention Not Indicated  Transportation Interventions Intervention Not Indicated      Distress Screen completed: Yes ONCBCN DISTRESS SCREENING 09/25/2020  Screening Type Initial Screening  Distress experienced in past week (1-10) 6  Practical problem type Work/school  Emotional problem type Nervousness/Anxiety;Adjusting to illness  Physical Problem type Nausea/vomiting;Breathing      Family/Social Information:  . Housing Arrangement: patient lives with husband, 3 children (76, 15, 62 yo) . Family members/support persons in your life? Family and Friends/Colleagues . Transportation concerns: no  . Employment: Working full timePhotographer. Income source: Employment . Also trying to go back to school (1 class next semester) . Financial concerns: No o Type of concern: None . Food access concerns: no . Services Currently in place:  Medication for mood  Coping/ Adjustment to diagnosis: . Patient understands treatment plan and what happens next? yes, feels better after learning more today. Will need mammaprint and MRI before confirming plan . Patient reported stressors: Anxiety and Adjusting to my illness . Hopes and priorities: Hopes to be able to complete her class for school . Patient enjoys time with her husband and kids, sitting outside with her chickens and dogs . Current coping skills/ strengths: Capable of independent living, Scientist, research (life sciences), Motivation for treatment/growth and Supportive family/friends    SUMMARY: Current  SDOH Barriers:  . No major barriers noted today  Clinical Social Work Clinical Goal(s):  Marland Kitchen Patient will continue to attend medical appts as recommended  Interventions: . Discussed common feeling and emotions when being diagnosed with cancer, and the importance of support during treatment . Informed patient of the support team roles and support services at [facility] . Provided CSW contact information and encouraged patient to call with any questions or concerns   Follow Up Plan: Patient will contact CSW with any support or resource needs Patient verbalizes understanding of plan: Yes    Christeen Douglas LCSW

## 2020-09-25 NOTE — Therapy (Signed)
[place], [place], [address] Phone: [phone]   Fax:  [phone]  Physical Therapy Evaluation  Patient Details  Name: Jennifer Davis MRN: [id number] Date of Birth: [birth year] Referring Provider (PT): Dr. Coralie Keens   Encounter Date: 09/25/2020   PT End of Session - 09/25/20 1240    Visit Number 1    Number of Visits 2    Date for PT Re-Evaluation 11/20/20    PT Start Time 5643    PT Stop Time 3295   Also saw pt from [id number] for a total of 29 minutes   PT Time Calculation (min) 23 min    Activity Tolerance Patient tolerated treatment well    Behavior During Therapy [facility] for tasks assessed/performed           Past Medical History:  Diagnosis Date  . Anxiety   . Asthma   . Breast cancer [facility])     Past Surgical History:  Procedure Laterality Date  . CESAREAN SECTION     x3    There were no vitals filed for this visit.    Subjective Assessment - 09/25/20 1233    Subjective Patient reports she is here today at the cancer center to be seen by her medical team for her newly diagnosed left breast cancer.    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 It measures 9 mm and is located in the upper outer quadrant. It is ER/PR positive and HER2 negative with a Ki67 of 25%. She has a positive axillary lymph node.    Patient Stated Goals Reduce lymphedema risk and learn post op shoulder ROM HEP    Currently in Pain? Yes    Pain Score 4     Pain Location Wrist    Pain Orientation Right    Pain Descriptors / Indicators Aching    Pain Type Chronic pain    Pain Onset More than a month ago    Pain Frequency Intermittent    Aggravating Factors  Working more than 3 days in a row; has carpal tunnel syndrome    Pain Relieving Factors Rest              [facility] PT Assessment - 09/25/20  0001      Assessment   Medical Diagnosis Left breast cancer    Referring Provider (PT) Dr. Coralie Keens    Onset Date/Surgical Date 08/28/20    Hand Dominance Right    Prior Therapy none      Precautions   Precautions Other (comment)    Precaution Comments active cancer      Restrictions   Weight Bearing Restrictions No      Balance Screen   Has the patient fallen in the past 6 months No    Has the patient had a decrease in activity level because of a fear of falling?  No    Is the patient reluctant to leave their home because of a fear of falling?  No      Home Environment   Living Environment Private residence    Living Arrangements Spouse/significant other;Children   Husband, 55, 54, 33 y.o. kids   Available Help at Discharge Family      Prior Function   Level of Independence Independent    Vocation Full time employment    Engineer, petroleum at [employer]  Leisure She does not exercise      Cognition   Overall Cognitive Status Within Functional Limits for tasks assessed      Posture/Postural Control   Posture/Postural Control Postural limitations    Postural Limitations Rounded Shoulders;Forward head      ROM / Strength   AROM / PROM / Strength AROM;Strength      AROM   Overall AROM Comments Cervical AROM is WNL    AROM Assessment Site Shoulder    Right/Left Shoulder Right;Left    Right Shoulder Extension 47 Degrees    Right Shoulder Flexion 157 Degrees    Right Shoulder ABduction 164 Degrees    Right Shoulder Internal Rotation 63 Degrees    Right Shoulder External Rotation 88 Degrees    Left Shoulder Extension 50 Degrees    Left Shoulder Flexion 158 Degrees    Left Shoulder ABduction 165 Degrees    Left Shoulder Internal Rotation 78 Degrees    Left Shoulder External Rotation 90 Degrees      Strength   Overall Strength Within functional limits for tasks performed             LYMPHEDEMA/ONCOLOGY QUESTIONNAIRE -  09/25/20 0001      Type   Cancer Type Left breast cancer      Lymphedema Assessments   Lymphedema Assessments Upper extremities      Right Upper Extremity Lymphedema   10 cm Proximal to Olecranon Process 37.8 cm    Olecranon Process 27.8 cm    10 cm Proximal to Ulnar Styloid Process 26.4 cm    Just Proximal to Ulnar Styloid Process 17.4 cm    Across Hand at [employer] 18 cm    At [place] of 2nd Digit 6.2 cm      Left Upper Extremity Lymphedema   10 cm Proximal to Olecranon Process 39.8 cm    Olecranon Process 27.3 cm    10 cm Proximal to Ulnar Styloid Process 24.8 cm    Just Proximal to Ulnar Styloid Process 16.3 cm    Across Hand at [employer] 17.4 cm    At [place] of 2nd Digit 6 cm           L-DEX FLOWSHEETS - 09/25/20 1200      L-DEX LYMPHEDEMA SCREENING   Measurement Type Unilateral    L-DEX MEASUREMENT EXTREMITY Upper Extremity    POSITION  Standing    DOMINANT SIDE Right    At Risk Side Left    BASELINE SCORE (UNILATERAL) 2.2           The patient was assessed using the L-Dex machine today to produce a lymphedema index baseline score. The patient will be reassessed on a regular basis (typically every 3 months) to obtain new L-Dex scores. If the score is > 6.5 points away from his/her baseline score indicating onset of subclinical lymphedema, it will be recommended to wear a compression garment for 4 weeks, 12 hours per day and then be reassessed. If the score continues to be > 6.5 points from baseline at reassessment, we will initiate lymphedema treatment. Assessing in this manner has a 95% rate of preventing clinically significant lymphedema.      Katina Dung - 09/25/20 0001    Open a tight or new jar Mild difficulty    Do heavy household chores (wash walls, wash floors) No difficulty    Carry a shopping bag or briefcase No difficulty    Wash your back Mild difficulty  Use a knife to cut food No difficulty    Recreational activities in which you take  some force or impact through your arm, shoulder, or hand (golf, hammering, tennis) No difficulty    During the past week, to what extent has your arm, shoulder or hand problem interfered with your normal social activities with family, friends, neighbors, or groups? Not at all    During the past week, to what extent has your arm, shoulder or hand problem limited your work or other regular daily activities Not at all    Arm, shoulder, or hand pain. Mild    Tingling (pins and needles) in your arm, shoulder, or hand Mild    Difficulty Sleeping Mild difficulty    DASH Score 11.36 %            Objective measurements completed on examination: See above findings.       Patient was instructed today in a home exercise program today for post op shoulder range of motion. These included active assist shoulder flexion in sitting, scapular retraction, wall walking with shoulder abduction, and hands behind head external rotation.  She was encouraged to do these twice a day, holding 3 seconds and repeating 5 times when permitted by her physician.            PT Education - 09/25/20 1240    Education Details Lymphedema risk reduction and post op shoulder ROM HEP    Person(s) Educated Patient;Spouse    Methods Explanation;Demonstration;Handout    Comprehension Returned demonstration;Verbalized understanding               PT Long Term Goals - 09/25/20 1247      PT LONG TERM GOAL #1   Title Patient will demonstrate she has regained full shoulder ROM and function post operatively compared to baselines.    Time 8    Period Weeks    Status New    Target Date 11/20/20           Breast Clinic Goals - 09/25/20 1247      Patient will be able to verbalize understanding of pertinent lymphedema risk reduction practices relevant to her diagnosis specifically related to skin care.   Time 1    Period Days    Status Achieved      Patient will be able to return demonstrate and/or verbalize  understanding of the post-op home exercise program related to regaining shoulder range of motion.   Time 1    Period Days    Status Achieved      Patient will be able to verbalize understanding of the importance of attending the postoperative After Breast Cancer Class for further lymphedema risk reduction education and therapeutic exercise.   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 It measures 9 mm and is located in the upper outer quadrant. It is ER/PR positive and HER2 negative with a Ki67 of 25%. She has a positive axillary lymph node. Her multidisciplinary medical team met prior to her assessments to determine a recommended treatment plan. She is planning to have an MRI and a Mammaprint test on her core biopsy. Depending on those results, she may undergo neoadjuvant chemotherapy or move to a left lumpectomy and targeted axillary lymph  node dissection, radiation, and anti-estrogen therapy. She will benefit from a post op PT reassessment to determine needs and from L-Dex screens every 3 months for 2 years to detect subclinical lymphedema.    Stability/Clinical Decision Making Stable/Uncomplicated    Clinical Decision Making Low    Rehab Potential Excellent    PT Frequency --   Eval and 1 f/u visit   PT Treatment/Interventions ADLs/Self Care Home Management;Therapeutic exercise;Patient/family education    PT Next Visit Plan Will reassess 3-4 week post op    PT Home Exercise Plan Post op shoulder ROM HEP    Consulted and Agree with Plan of Care Patient;Family member/caregiver    Family Member Consulted Husband           Patient will benefit from skilled therapeutic intervention in order to improve the following deficits and impairments:  Postural dysfunction, Decreased range of motion, Impaired UE functional use,  Pain, Decreased knowledge of precautions  Visit Diagnosis: Malignant neoplasm of upper-outer quadrant of left breast in female, estrogen receptor positive ([place]) - Plan: PT plan of care cert/re-cert  Abnormal posture - Plan: PT plan of care cert/re-cert   Patient will follow up at outpatient cancer rehab 3-4 weeks following surgery.  If the patient requires physical therapy at that time, a specific plan will be dictated and sent to the referring physician for approval. The patient was educated today on appropriate basic range of motion exercises to begin post operatively and the importance of attending the After Breast Cancer class following surgery.  Patient was educated today on lymphedema risk reduction practices as it pertains to recommendations that will benefit the patient immediately following surgery.  She verbalized good understanding.      Problem List Patient Active Problem List   Diagnosis Date Noted  . Malignant neoplasm of upper-outer quadrant of left breast in female, estrogen receptor positive ([place]) 09/19/2020  . Depression 08/06/2018  . PTSD (post-traumatic stress disorder) 08/06/2018  . Palpitations 01/09/2016  . Shortness of breath 01/09/2016  . Anxiety 01/09/2016  . Asthma 01/09/2016  . Morbid obesity ([place]) 01/09/2016    Annia Friendly, PT 09/25/20 12:50 PM  [place], [place], [address] Phone: [phone]   Fax:  [phone]  Name: Tuere Nwosu MRN: [id number] Date of Birth: 16-Mar-1976

## 2020-09-25 NOTE — Assessment & Plan Note (Signed)
09/20/2019: Screening mammogram on 08/28/20 showed the stable bilateral masses, a new 0.9cm mass at the 1 o'clock position in the left breast, and one abnormal left axillary lymph node with 0.6cm cortical thickening. 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   Pathology and radiology counseling: Discussed with the patient, the details of pathology including the type of breast cancer,the clinical staging, the significance of ER, PR and HER-2/neu receptors and the implications for treatment. After reviewing the pathology in detail, we proceeded to discuss the different treatment options between surgery, radiation, chemotherapy, antiestrogen therapies.  Treatment plan: 1.  Breast conserving surgery with targeted node dissection 2. MammaPrint testing on the biopsy to determine if she needs chemotherapy (that can guide [place] port placement) 3.  Adjuvant radiation 4.  Followed by adjuvant antiestrogen therapy  Return to clinic after surgery to discuss the final pathology report. 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

## 2020-09-25 NOTE — Progress Notes (Signed)
Radiation Oncology         [phone] ________________________________  Multidisciplinary Breast Oncology Clinic [facility]) Initial Outpatient Consultation  Name: Jennifer Davis MRN: [id number]  Date: 09/25/2020  DOB: August 15, 1976  FX:[id number], [place] Ar, MD  Coralie Keens, MD   REFERRING PHYSICIAN: Coralie Keens, MD  DIAGNOSIS: The encounter diagnosis was Malignant neoplasm of upper-outer quadrant of left breast in female, estrogen receptor positive ([place]).  Stage T1b, N1, Mx, Left Breast UOQ, Invasive Ductal Carcinoma with DCIS, ER+ / PR+ / Her2-, Grade 2    ICD-10-CM   1. Malignant neoplasm of upper-outer quadrant of left breast in female, estrogen receptor positive ([place])  C50.412    Z17.0     HISTORY OF PRESENT ILLNESS::Jennifer Davis is a 44 y.o. female who is presenting to the office today for evaluation of her newly diagnosed breast cancer. She is accompanied by her husband. She is doing well overall.   She underwent bilateral diagnostic mammography with tomography and bilateral breast ultrasonography at [facility] on 08/28/2020 for follow-up of breast masses that were first evaluated in September of 2019. Results showed stable bilateral breast masses that were consistent with fibrocystic changes. Given the stability for over two years, they were considered benign findings. There were also noted to be a few additional cystic masses in the left breast that had a similar appearance and were considered benign. However, there was a newly visualized indeterminate mass with shadowing at the 1 o'clock region of the left breast that was 1 cm from the nipple. Additionally, there was an abnormal left axillary lymph node.  Biopsy on 09/17/2020 showed grade 2 invasive ductal carcinoma with ductal carcinoma in situ. Prognostic indicators were significant for: estrogen receptor, 90% positive with a moderate staining intensity and progesterone receptor, 60% positive with a strong staining  intensity. Proliferation marker Ki67 at 25%. HER2 negative. Biopsy of left axillary lymph node was positive for metastatic carcinoma. Prognostic indicators were significant for: estrogen receptor, >95% positive and progesterone receptor, >95% positive, both with strong staining intensities. Proliferation marker Ki67 at 25%. HER2 negative.  Menarche: 44 years old Age at first live birth: 43 years old GP: 3 LMP: 09/11/2020 Contraceptive: None HRT: None   The patient was referred today for presentation in the multidisciplinary conference.  Radiology studies and pathology slides were presented there for review and discussion of treatment options.  A consensus was discussed regarding potential next steps.  PREVIOUS RADIATION THERAPY: No  PAST MEDICAL HISTORY:  Past Medical History:  Diagnosis Date  . Anxiety   . Asthma   . Breast cancer ([place])     PAST SURGICAL HISTORY: Past Surgical History:  Procedure Laterality Date  . CESAREAN SECTION     x3    FAMILY HISTORY:  Family History  Problem Relation Age of Onset  . Hypertension Mother   . Fibromyalgia Mother   . Colon cancer Mother 87  . Asthma Sister        x2  . Yves Dill Parkinson White syndrome Maternal Aunt 42  . Hypertension Maternal Grandmother   . Hypertension Maternal Grandfather   . Cancer - Lung Maternal Grandfather   . Stroke Maternal Grandfather   . Colon cancer Other        MGM's niece, dx unknown age  . Colon cancer Other        MGM's nephew; dx unknown age  . Breast cancer Neg Hx     SOCIAL HISTORY:  Social History   Socioeconomic History  . Marital status:  Married    Spouse name: Not on file  . Number of children: 3  . Years of education: associates  . Highest education level: Not on file  Occupational History  . Occupation: Hydrologist: [employer]  Tobacco Use  . Smoking status: Former Smoker    Types: Cigarettes    Quit date: 01/09/2012    Years since quitting: 8.7  .  Smokeless tobacco: Never Used  . Tobacco comment: social smoker  Substance and Sexual Activity  . Alcohol use: Not Currently    Alcohol/week: 0.0 standard drinks    Comment: very rare  . Drug use: Never  . Sexual activity: Not on file  Other Topics Concern  . Not on file  Social History Narrative   epworth sleepiness scale = 10 (01/09/2016)   Social Determinants of Health   Financial Resource Strain:   . Difficulty of Paying Living Expenses: Not on file  Food Insecurity: No Food Insecurity  . Worried About Charity fundraiser in the Last Year: Never true  . Ran Out of Food in the Last Year: Never true  Transportation Needs: No Transportation Needs  . Lack of Transportation (Medical): No  . Lack of Transportation (Non-Medical): No  Physical Activity:   . Days of Exercise per Week: Not on file  . Minutes of Exercise per Session: Not on file  Stress:   . Feeling of Stress : Not on file  Social Connections:   . Frequency of Communication with Friends and Family: Not on file  . Frequency of Social Gatherings with Friends and Family: Not on file  . Attends Religious Services: Not on file  . Active Member of Clubs or Organizations: Not on file  . Attends Archivist Meetings: Not on file  . Marital Status: Not on file    ALLERGIES: No Known Allergies  MEDICATIONS:  Current Outpatient Medications  Medication Sig Dispense Refill  . azithromycin (ZITHROMAX Z-PAK) 250 MG tablet Take 1 tablet (250 mg total) by mouth daily. Take 2 tablets on the first day of treatment. Then take 1 tablet per day for the next four days. 6 tablet 0  . busPIRone (BUSPAR) 30 MG tablet Take 1 tablet (30 mg total) by mouth 2 (two) times daily. 60 tablet 5  . cetirizine (ZYRTEC) 10 MG tablet Take 10 mg by mouth daily.    . DULoxetine (CYMBALTA) 60 MG capsule Take 2 capsules (120 mg total) by mouth daily. 60 capsule 5  . EPINEPHrine (EPIPEN 2-PAK) 0.3 mg/0.3 mL IJ SOAJ injection Inject 0.3 mLs (0.3 mg  total) into the muscle once as needed for up to 1 dose (for severe allergic reaction). CAll 911 immediately if you have to use this medicine 1 each 1  . gabapentin (NEURONTIN) 100 MG capsule Take 1 capsule (100 mg total) by mouth 3 (three) times daily as needed. 90 capsule 1  . melatonin 5 MG TABS Take 5 mg by mouth at bedtime as needed.    . montelukast (SINGULAIR) 10 MG tablet Take 1 tablet by mouth daily.  5  . Multiple Vitamins-Minerals (MULTIVITAMIN PO) Take 2 tablets by mouth daily. Skin/hair/nail supplement.    Marland Kitchen PROAIR HFA 108 (90 Base) MCG/ACT inhaler Inhale 2 puffs into the lungs as needed.  0  . SYMBICORT 160-4.5 MCG/ACT inhaler Inhale 2 puffs into the lungs 2 (two) times daily.  5   No current facility-administered medications for this encounter.    REVIEW OF SYSTEMS:  A 10+ POINT REVIEW OF SYSTEMS WAS OBTAINED including neurology, dermatology, psychiatry, cardiac, respiratory, lymph, extremities, GI, GU, musculoskeletal, constitutional, reproductive, HEENT. On the provided form, she reports night sweats, fatigue, muscle aches, wearing contacts and glasses, hoarse voice, shortness of breath, productive cough, nausea, vomiting, heartburn, irregular heartbeat, breast pain, back pain, headaches, weakness, anxiety, and swollen lymph glands. She denies dysuria, rash, and any other symptoms.    PHYSICAL EXAM:  Vitals with BMI 09/25/2020  Height 4' 11.5"  Weight 178 lbs  BMI 87.56  Systolic 433  Diastolic 77  Pulse 98   Lungs are clear to auscultation bilaterally. Heart has regular rate and rhythm. No palpable cervical, supraclavicular, or axillary adenopathy. Abdomen soft, non-tender, normal bowel sounds. Right breast with no palpable mass, nipple discharge, or bleeding.  Left breast with a small biopsy site in the upper outer quadrant. No palpable mass, nipple discharge, or bleeding.   KPS = 90  100 - Normal; no complaints; no evidence of disease. 90   - Able to carry on normal  activity; minor signs or symptoms of disease. 80   - Normal activity with effort; some signs or symptoms of disease. 37   - Cares for self; unable to carry on normal activity or to do active work. 60   - Requires occasional assistance, but is able to care for most of his personal needs. 50   - Requires considerable assistance and frequent medical care. 75   - Disabled; requires special care and assistance. 22   - Severely disabled; hospital admission is indicated although death not imminent. 35   - Very sick; hospital admission necessary; active supportive treatment necessary. 10   - Moribund; fatal processes progressing rapidly. 0     - Dead  Karnofsky DA, Abelmann [place], Craver LS and Burchenal [facility] [phone]) The use of the nitrogen mustards in the palliative treatment of carcinoma: with particular reference to bronchogenic carcinoma Cancer 1 [id number]  LABORATORY DATA:  Lab Results  Component Value Date   WBC 9.9 09/25/2020   HGB 11.7 (L) 09/25/2020   HCT 36.3 09/25/2020   MCV 86.4 09/25/2020   PLT 426 (H) 09/25/2020   Lab Results  Component Value Date   NA 139 09/25/2020   K 3.7 09/25/2020   CL 106 09/25/2020   CO2 22 09/25/2020   Lab Results  Component Value Date   ALT 13 09/25/2020   AST 13 (L) 09/25/2020   ALKPHOS 79 09/25/2020   BILITOT 0.3 09/25/2020    PULMONARY FUNCTION TEST:   Recent Review Flowsheet Data   There is no flowsheet data to display.     RADIOGRAPHY: US BREAST LTD UNI LEFT INC AXILLA  Result Date: 08/28/2020 CLINICAL DATA:  44 year old female presenting for follow-up of probably benign bilateral breast masses that were first evaluated in September 2019. EXAM: DIGITAL DIAGNOSTIC BILATERAL MAMMOGRAM WITH CAD AND TOMO ULTRASOUND BILATERAL BREAST COMPARISON:  Previous exam(s). ACR Breast Density Category c: The breast tissue is heterogeneously dense, which may obscure small masses. FINDINGS: Mammogram: Right breast: Full field tomosynthesis views of the right  breast were performed. Stable appearance of a focal asymmetry with associated calcifications in the right axillary tail. No suspicious mass, distortion, or microcalcifications are identified to suggest presence of malignancy. Left breast: Spot compression tomosynthesis views were performed in addition to standard views. There is a newly visualized oval circumscribed mass in the lower outer right breast measuring approximately 0.5 cm. Additional scattered oval circumscribed masses as well as a focal  asymmetry in the upper outer quadrant are not significantly changed compared to the prior mammogram. Mammographic images were processed with CAD. Ultrasound: Right breast: Targeted ultrasound is performed in the right breast at 10 o'clock 6 cm from the nipple demonstrating a cyst measuring 0.3 x 0.2 x 0.3 cm, previously measuring 0.4 x 0.3 x 0.3 cm. At 9:30 o'clock 7 cm from the nipple there is a hypoechoic mass measuring 0.8 x 0.3 x 0.7 cm, previously measuring 0.7 x 0.3 x 0.6 cm. Left breast: Targeted ultrasound is performed in the left breast at 1 o'clock 7 cm from nipple demonstrating an oval circumscribed hypoechoic mass measuring 1.0 x 0.4 x 0.7 cm, previously measuring 1.0 x 0.4 x 0.7 cm. At 1 o'clock 5 cm from the nipple there are two small anechoic masses, unclear which of these corresponds to the previously seen cystic mass at 1 o'clock 5 cm from the nipple. No suspicious solid component. At 3:30 o'clock 3 cm from the nipple there is another cystic mass measuring 1.5 x 0.7 x 1.0 cm, previously measuring 1.5 x 0.6 x 1.0 cm. At 5 o'clock 4 cm from the nipple there is an oval circumscribed hypoechoic mass measuring 0.6 x 0.3 x 0.6 cm which likely corresponds to the mass seen mammographically and has a similar appearance to the patient's other fibrocystic changes. There is another similar mass incidentally noted at 1 o'clock 7 cm from the nipple measuring 0.9 x 0.4 x 0.7 cm. Also incidentally noted at 1 o'clock 1 cm  from the nipple is an ill-defined hypoechoic mass with shadowing measuring 0.8 x 0.9 x 0.7 cm. Small amount of associated vascularity. In retrospect this may correspond to a possible subtle area of distortion in the slightly outer anterior left breast best seen on the cc view image 42/81. Targeted ultrasound of the left axilla demonstrates an abnormal axillary lymph node with cortical thickness measuring 0.6 cm. IMPRESSION: 1. Stable bilateral breast masses consistent with fibrocystic changes, as detailed above. Given stability for over two years these are considered benign findings. A few additional cystic masses identified on today's exam in the left breast have a similar appearance and are considered benign. 2. Newly visualized indeterminate mass with shadowing at 1 o'clock 1 cm from the nipple in the left breast. 3.  Abnormal left axillary lymph node. RECOMMENDATION: Ultrasound-guided core needle biopsy of the left breast mass at 1 o'clock and of the abnormal left axillary lymph node. I have discussed the findings and recommendations with the patient. If applicable, a reminder letter will be sent to the patient regarding the next appointment. BI-RADS CATEGORY  4: Suspicious. Electronically Signed   By: Audie Pinto M.D.   On: 08/28/2020 17:03   US BREAST LTD UNI RIGHT INC AXILLA  Result Date: 08/28/2020 CLINICAL DATA:  44 year old female presenting for follow-up of probably benign bilateral breast masses that were first evaluated in September 2019. EXAM: DIGITAL DIAGNOSTIC BILATERAL MAMMOGRAM WITH CAD AND TOMO ULTRASOUND BILATERAL BREAST COMPARISON:  Previous exam(s). ACR Breast Density Category c: The breast tissue is heterogeneously dense, which may obscure small masses. FINDINGS: Mammogram: Right breast: Full field tomosynthesis views of the right breast were performed. Stable appearance of a focal asymmetry with associated calcifications in the right axillary tail. No suspicious mass, distortion,  or microcalcifications are identified to suggest presence of malignancy. Left breast: Spot compression tomosynthesis views were performed in addition to standard views. There is a newly visualized oval circumscribed mass in the lower outer right breast measuring  approximately 0.5 cm. Additional scattered oval circumscribed masses as well as a focal asymmetry in the upper outer quadrant are not significantly changed compared to the prior mammogram. Mammographic images were processed with CAD. Ultrasound: Right breast: Targeted ultrasound is performed in the right breast at 10 o'clock 6 cm from the nipple demonstrating a cyst measuring 0.3 x 0.2 x 0.3 cm, previously measuring 0.4 x 0.3 x 0.3 cm. At 9:30 o'clock 7 cm from the nipple there is a hypoechoic mass measuring 0.8 x 0.3 x 0.7 cm, previously measuring 0.7 x 0.3 x 0.6 cm. Left breast: Targeted ultrasound is performed in the left breast at 1 o'clock 7 cm from nipple demonstrating an oval circumscribed hypoechoic mass measuring 1.0 x 0.4 x 0.7 cm, previously measuring 1.0 x 0.4 x 0.7 cm. At 1 o'clock 5 cm from the nipple there are two small anechoic masses, unclear which of these corresponds to the previously seen cystic mass at 1 o'clock 5 cm from the nipple. No suspicious solid component. At 3:30 o'clock 3 cm from the nipple there is another cystic mass measuring 1.5 x 0.7 x 1.0 cm, previously measuring 1.5 x 0.6 x 1.0 cm. At 5 o'clock 4 cm from the nipple there is an oval circumscribed hypoechoic mass measuring 0.6 x 0.3 x 0.6 cm which likely corresponds to the mass seen mammographically and has a similar appearance to the patient's other fibrocystic changes. There is another similar mass incidentally noted at 1 o'clock 7 cm from the nipple measuring 0.9 x 0.4 x 0.7 cm. Also incidentally noted at 1 o'clock 1 cm from the nipple is an ill-defined hypoechoic mass with shadowing measuring 0.8 x 0.9 x 0.7 cm. Small amount of associated vascularity. In retrospect  this may correspond to a possible subtle area of distortion in the slightly outer anterior left breast best seen on the cc view image 42/81. Targeted ultrasound of the left axilla demonstrates an abnormal axillary lymph node with cortical thickness measuring 0.6 cm. IMPRESSION: 1. Stable bilateral breast masses consistent with fibrocystic changes, as detailed above. Given stability for over two years these are considered benign findings. A few additional cystic masses identified on today's exam in the left breast have a similar appearance and are considered benign. 2. Newly visualized indeterminate mass with shadowing at 1 o'clock 1 cm from the nipple in the left breast. 3.  Abnormal left axillary lymph node. RECOMMENDATION: Ultrasound-guided core needle biopsy of the left breast mass at 1 o'clock and of the abnormal left axillary lymph node. I have discussed the findings and recommendations with the patient. If applicable, a reminder letter will be sent to the patient regarding the next appointment. BI-RADS CATEGORY  4: Suspicious. Electronically Signed   By: Audie Pinto M.D.   On: 08/28/2020 17:03   MM DIAG BREAST TOMO BILATERAL  Result Date: 08/28/2020 CLINICAL DATA:  44 year old female presenting for follow-up of probably benign bilateral breast masses that were first evaluated in September 2019. EXAM: DIGITAL DIAGNOSTIC BILATERAL MAMMOGRAM WITH CAD AND TOMO ULTRASOUND BILATERAL BREAST COMPARISON:  Previous exam(s). ACR Breast Density Category c: The breast tissue is heterogeneously dense, which may obscure small masses. FINDINGS: Mammogram: Right breast: Full field tomosynthesis views of the right breast were performed. Stable appearance of a focal asymmetry with associated calcifications in the right axillary tail. No suspicious mass, distortion, or microcalcifications are identified to suggest presence of malignancy. Left breast: Spot compression tomosynthesis views were performed in addition to  standard views. There is a newly  visualized oval circumscribed mass in the lower outer right breast measuring approximately 0.5 cm. Additional scattered oval circumscribed masses as well as a focal asymmetry in the upper outer quadrant are not significantly changed compared to the prior mammogram. Mammographic images were processed with CAD. Ultrasound: Right breast: Targeted ultrasound is performed in the right breast at 10 o'clock 6 cm from the nipple demonstrating a cyst measuring 0.3 x 0.2 x 0.3 cm, previously measuring 0.4 x 0.3 x 0.3 cm. At 9:30 o'clock 7 cm from the nipple there is a hypoechoic mass measuring 0.8 x 0.3 x 0.7 cm, previously measuring 0.7 x 0.3 x 0.6 cm. Left breast: Targeted ultrasound is performed in the left breast at 1 o'clock 7 cm from nipple demonstrating an oval circumscribed hypoechoic mass measuring 1.0 x 0.4 x 0.7 cm, previously measuring 1.0 x 0.4 x 0.7 cm. At 1 o'clock 5 cm from the nipple there are two small anechoic masses, unclear which of these corresponds to the previously seen cystic mass at 1 o'clock 5 cm from the nipple. No suspicious solid component. At 3:30 o'clock 3 cm from the nipple there is another cystic mass measuring 1.5 x 0.7 x 1.0 cm, previously measuring 1.5 x 0.6 x 1.0 cm. At 5 o'clock 4 cm from the nipple there is an oval circumscribed hypoechoic mass measuring 0.6 x 0.3 x 0.6 cm which likely corresponds to the mass seen mammographically and has a similar appearance to the patient's other fibrocystic changes. There is another similar mass incidentally noted at 1 o'clock 7 cm from the nipple measuring 0.9 x 0.4 x 0.7 cm. Also incidentally noted at 1 o'clock 1 cm from the nipple is an ill-defined hypoechoic mass with shadowing measuring 0.8 x 0.9 x 0.7 cm. Small amount of associated vascularity. In retrospect this may correspond to a possible subtle area of distortion in the slightly outer anterior left breast best seen on the cc view image 42/81. Targeted  ultrasound of the left axilla demonstrates an abnormal axillary lymph node with cortical thickness measuring 0.6 cm. IMPRESSION: 1. Stable bilateral breast masses consistent with fibrocystic changes, as detailed above. Given stability for over two years these are considered benign findings. A few additional cystic masses identified on today's exam in the left breast have a similar appearance and are considered benign. 2. Newly visualized indeterminate mass with shadowing at 1 o'clock 1 cm from the nipple in the left breast. 3.  Abnormal left axillary lymph node. RECOMMENDATION: Ultrasound-guided core needle biopsy of the left breast mass at 1 o'clock and of the abnormal left axillary lymph node. I have discussed the findings and recommendations with the patient. If applicable, a reminder letter will be sent to the patient regarding the next appointment. BI-RADS CATEGORY  4: Suspicious. Electronically Signed   By: Audie Pinto M.D.   On: 08/28/2020 17:03   [place] AXILLARY NODE CORE BIOPSY LEFT  Addendum Date: 09/18/2020   ADDENDUM REPORT: 09/18/2020 10:59 ADDENDUM: Pathology revealed GRADE II INVASIVE DUCTAL CARCINOMA, DUCTAL CARCINOMA IN SITU of the LEFT breast, 1 o'clock, (ribbon clip). This was found to be concordant by Dr. Nolon Nations. Pathology revealed METASTATIC CARCINOMA INVOLVING A LYMPH NODE of the LEFT axilla, (coil clip). This was found to be concordant by Dr. Nolon Nations. Pathology results were discussed with the patient by telephone. The patient reported doing well after the biopsies with tenderness at the sites. Post biopsy instructions and care were reviewed and questions were answered. The patient was encouraged to call [facility] for any additional concerns. My direct phone number was provided. The patient was referred to [facility] on September 25, 2020. Recommendation for a  bilateral breast MRI given heterogeneously dense breast tissue and age. Pathology results reported by Terie Purser, RN on 09/18/2020. Electronically Signed   By: Nolon Nations M.D.   On: 09/18/2020 10:59   Result Date: 09/18/2020 CLINICAL DATA:  Patient presents ultrasound-guided core biopsy of mass in the LEFT breast and enlarged LEFT axillary lymph node. EXAM: ULTRASOUND GUIDED LEFT BREAST CORE NEEDLE BIOPSY ULTRASOUND-GUIDED LEFT AXILLARY CORE NEEDLE BIOPSY COMPARISON:  Previous exam(s). PROCEDURE: I met with the patient and we discussed the procedure of ultrasound-guided biopsy, including benefits and alternatives. We discussed the high likelihood of a successful procedure. We discussed the risks of the procedure, including infection, bleeding, tissue injury, clip migration, and inadequate sampling. Informed written consent was given. The usual time-out protocol was performed immediately prior to the procedure. Site 1: 1 o'clock location LEFT breast. Ribbon shaped clip. Lesion quadrant: UPPER-OUTER QUADRANT LEFT breast Using sterile technique and 1% Lidocaine as local anesthetic, under direct ultrasound visualization, a 12 gauge spring-loaded device was used to perform biopsy of mass in the 1 o'clock location of the LEFT breast using a LATERAL to MEDIAL approach. At the conclusion of the procedure ribbon shaped tissue marker clip was deployed into the biopsy cavity. Site 2: LEFT axilla. Coil shaped clip. Lesion QUADRANT: LEFT axilla. Using sterile technique and 1% Lidocaine as local anesthetic, under direct ultrasound visualization, a 14 gauge spring-loaded device was used to perform biopsy of enlarged LEFT axillary lymph node using a LATERAL to MEDIAL approach. At the conclusion of the procedure coil shaped tissue marker clip was deployed into the biopsy cavity. Follow up 2 view mammogram was performed and dictated separately. IMPRESSION: Ultrasound guided biopsy of mass in the 1 o'clock location of the  LEFT breast and enlarged LEFT axillary lymph node. No apparent complications. Electronically Signed: By: Nolon Nations M.D. On: 09/17/2020 13:56   MM CLIP PLACEMENT LEFT  Result Date: 09/17/2020 CLINICAL DATA:  Status post ultrasound-guided core biopsy of mass in the LEFT breast and enlarged LEFT axillary lymph node. EXAM: 3D DIAGNOSTIC LEFT MAMMOGRAM POST ULTRASOUND BIOPSY x2 COMPARISON:  Previous exam(s). FINDINGS: 3D Mammographic images were obtained following ultrasound guided biopsy of mass in the 1 o'clock location of the LEFT breast and placement of a ribbon shaped clip. The biopsy marking clip is in expected position at the site of biopsy. Following biopsy of enlarged LEFT axillary lymph node, a coil shaped clip was placed in is identified in the expected location. IMPRESSION: Tissue marker clips are in the expected locations after biopsy. Final Assessment: Post Procedure Mammograms for Marker Placement Electronically Signed   By: Nolon Nations M.D.   On: 09/17/2020 14:50   [place] LT BREAST BX W LOC DEV 1ST LESION IMG BX SPEC US GUIDE  Addendum Date: 09/18/2020   ADDENDUM REPORT: 09/18/2020 10:59 ADDENDUM: Pathology revealed GRADE II INVASIVE DUCTAL CARCINOMA, DUCTAL CARCINOMA IN SITU of the LEFT breast, 1 o'clock, (ribbon clip). This was found to be concordant by Dr. Nolon Nations. Pathology revealed METASTATIC CARCINOMA INVOLVING A LYMPH NODE of the LEFT axilla, (coil clip). This was found to be concordant by Dr. Nolon Nations. Pathology results were discussed with the patient by telephone. The patient reported doing well after the biopsies with tenderness at the sites. Post biopsy instructions  and care were reviewed and questions were answered. The patient was encouraged to call [facility] for any additional concerns. My direct phone number was provided. The patient was referred to [facility] on September 25, 2020. Recommendation for a bilateral breast MRI given heterogeneously dense breast tissue and age. Pathology results reported by Terie Purser, RN on 09/18/2020. Electronically Signed   By: Nolon Nations M.D.   On: 09/18/2020 10:59   Result Date: 09/18/2020 CLINICAL DATA:  Patient presents ultrasound-guided core biopsy of mass in the LEFT breast and enlarged LEFT axillary lymph node. EXAM: ULTRASOUND GUIDED LEFT BREAST CORE NEEDLE BIOPSY ULTRASOUND-GUIDED LEFT AXILLARY CORE NEEDLE BIOPSY COMPARISON:  Previous exam(s). PROCEDURE: I met with the patient and we discussed the procedure of ultrasound-guided biopsy, including benefits and alternatives. We discussed the high likelihood of a successful procedure. We discussed the risks of the procedure, including infection, bleeding, tissue injury, clip migration, and inadequate sampling. Informed written consent was given. The usual time-out protocol was performed immediately prior to the procedure. Site 1: 1 o'clock location LEFT breast. Ribbon shaped clip. Lesion quadrant: UPPER-OUTER QUADRANT LEFT breast Using sterile technique and 1% Lidocaine as local anesthetic, under direct ultrasound visualization, a 12 gauge spring-loaded device was used to perform biopsy of mass in the 1 o'clock location of the LEFT breast using a LATERAL to MEDIAL approach. At the conclusion of the procedure ribbon shaped tissue marker clip was deployed into the biopsy cavity. Site 2: LEFT axilla. Coil shaped clip. Lesion QUADRANT: LEFT axilla. Using sterile technique and 1% Lidocaine as local anesthetic, under direct ultrasound visualization, a 14 gauge spring-loaded device was used to perform biopsy of enlarged LEFT axillary lymph node using a LATERAL to MEDIAL approach. At the conclusion of the procedure coil shaped tissue marker clip was deployed into the biopsy cavity. Follow up 2 view mammogram was performed and dictated separately. IMPRESSION: Ultrasound guided  biopsy of mass in the 1 o'clock location of the LEFT breast and enlarged LEFT axillary lymph node. No apparent complications. Electronically Signed: By: Nolon Nations M.D. On: 09/17/2020 13:56      IMPRESSION: Stage T1b, N1, Mx, Left Breast UOQ, Invasive Ductal Carcinoma with DCIS, ER+ / PR+ / Her2-, Grade 2  The patient will be a good candidate for breast conservation with radiotherapy to the left breast. We discussed the general course of radiation, potential side effects, and toxicities with radiation and the patient is interested in this approach.   PLAN:  1. Genetics 2. MRI 3. MammaPrint from biopsy 4. Left lumpectomy with TAD 5. Adjuvant radiation therapy 6. Aromatase inhibitor   ------------------------------------------------  Blair Promise, PhD, MD  This document serves as a record of services personally performed by Gery Pray, MD. It was created on his behalf by Clerance Lav, a trained medical scribe. The creation of this record is based on the scribe's personal observations and the provider's statements to them. This document has been checked and approved by the attending provider.

## 2020-09-25 NOTE — Progress Notes (Signed)
REFERRING PROVIDER: Nicholas Lose, MD [address] [place],  [place] [address]  PRIMARY PROVIDER:  Wenda Low, MD   PRIMARY REASON FOR VISIT:  1. Malignant neoplasm of upper-outer quadrant of left breast in female, estrogen receptor positive ([place])   2. Family history of colon cancer    HISTORY OF PRESENT ILLNESS:   Ms. Larrivee, a 44 y.o. female, was seen for a [facility] cancer genetics consultation at the request of Dr. Lindi Adie due to a personal and family history of cancer.  Ms. Lemberger presents to clinic today to discuss the possibility of a hereditary predisposition to cancer, to discuss genetic testing, and to further clarify her future cancer risks, as well as potential cancer risks for family members.   In November 2021, at the age of 46, Ms. Deas was diagnosed with invasive ductal carcinoma and ductal carcinoma in situ of the left breast (ER+/PR+/HER2-). The preliminary treatment plan includes surgery, MammaPrint to determine need for chemotherapy, adjuvant radiation, and adjuvant antiestrogens.   CANCER HISTORY:  Oncology History  Malignant neoplasm of upper-outer quadrant of left breast in female, estrogen receptor positive ([place])  09/19/2020 Initial Diagnosis   Mammogram in 07/2018 showed probably benign bilateral breast masses that were stable on her last mammogram on 07/26/19. Mammogram on 08/28/20 showed the stable bilateral masses, a new 0.9cm mass at the 1 o'clock position in the left breast, and one abnormal left axillary lymph node with 0.6cm cortical thickening. 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     RISK FACTORS:  Menarche was at age 47.  First live birth at age 63.  OCP use for approximately 0 years.  Ovaries intact: no.  Hysterectomy: no.  Menopausal status: premenopausal.  HRT use: 0 years. Colonoscopy: no; not examined. Mammogram within the last  year: yes.  Past Medical History:  Diagnosis Date  . Anxiety   . Asthma   . Breast cancer [facility])     Past Surgical History:  Procedure Laterality Date  . CESAREAN SECTION     x3    Social History   Socioeconomic History  . Marital status: Married    Spouse name: Not on file  . Number of children: 3  . Years of education: associates  . Highest education level: Not on file  Occupational History  . Occupation: Hydrologist: [employer]  Tobacco Use  . Smoking status: Former Smoker    Types: Cigarettes    Quit date: 01/09/2012    Years since quitting: 8.7  . Smokeless tobacco: Never Used  . Tobacco comment: social smoker  Substance and Sexual Activity  . Alcohol use: Not Currently    Alcohol/week: 0.0 standard drinks    Comment: very rare  . Drug use: Never  . Sexual activity: Not on file  Other Topics Concern  . Not on file  Social History Narrative   epworth sleepiness scale = 10 (01/09/2016)   Social Determinants of Health   Financial Resource Strain:   . Difficulty of Paying Living Expenses: Not on file  Food Insecurity:   . Worried About Charity fundraiser in the Last Year: Not on file  . Ran Out of Food in the Last Year: Not on file  Transportation Needs:   . Lack of Transportation (Medical): Not on file  . Lack of Transportation (Non-Medical): Not on file  Physical  Activity:   . Days of Exercise per Week: Not on file  . Minutes of Exercise per Session: Not on file  Stress:   . Feeling of Stress : Not on file  Social Connections:   . Frequency of Communication with Friends and Family: Not on file  . Frequency of Social Gatherings with Friends and Family: Not on file  . Attends Religious Services: Not on file  . Active Member of Clubs or Organizations: Not on file  . Attends Archivist Meetings: Not on file  . Marital Status: Not on file     FAMILY HISTORY:  We obtained a detailed, 4-generation family history.  Significant  diagnoses are listed below: Family History  Problem Relation Age of Onset  . Colon cancer Mother 44  . Colon cancer Other        MGM's niece, dx unknown age  . Colon cancer Other        MGM's nephew; dx unknown age    Ms. Golphin has two sons and one daughter, all without a history of cancer.  She has two sisters, ages 52 and 34, who have not had cancer.  Ms. Dampier mother, age 47, was diagnosed with cancer at age 53.  Ms. Keitt maternal grandfather had lung cancer and passed away at age 74.  Ms. Bednarz reported two distant maternal cousins with a history of colon cancer at an unknown age.  No other maternal family history of cancer was reported.  Ms. Pentland father passed away at age 16 and did not have cancer.  No paternal family history of cancer was reported.   Ms. Virden is unaware of previous family history of genetic testing for hereditary cancer risks. Patient's maternal ancestors are of [place]  descent, and paternal ancestors are of [place] descent. There is no reported Ashkenazi Jewish ancestry. There is no known consanguinity.  GENETIC COUNSELING ASSESSMENT: Ms. Bergey is a 44 y.o. female with a personal history of breast cancer which is somewhat suggestive of a hereditary cancer syndrome and predisposition to cancer given her age of diagnosis. We, therefore, discussed and recommended the following at today's visit.   DISCUSSION: We discussed that 5 - 10% of cancer is hereditary.  Most cases of hereditary breast cancer are associated with mutations in BRCA1/2.  There are other genes that can be associated with hereditary breast and colon cancer syndromes.  Type of cancer risk and level of risk are gene-specific.  We discussed that testing is beneficial for several reasons including knowing how to follow individuals after completing their treatment, identifying whether potential treatment options would be beneficial, and understanding if other family members could be at risk for cancer and  allowing them to undergo genetic testing.   We reviewed the characteristics, features and inheritance patterns of hereditary cancer syndromes. We also discussed genetic testing, including the appropriate family members to test, the process of testing, insurance coverage and turn-around-time for results. We discussed the implications of a negative, positive and/or variant of uncertain significant result. In order to get genetic test results in a timely manner so that Ms. Lofstrom can use these genetic test results for surgical decisions, we recommended Ms. Bai pursue genetic testing for the STAT Breast Cancer Panel.  The STAT Breast cancer panel offered by [facility] includes sequencing and rearrangement analysis for the following 9 genes:  ATM, BRCA1, BRCA2, CDH1, CHEK2, PALB2, PTEN, STK11 and TP53.  Once complete, we recommend Ms. Brumm pursue reflex genetic testing to a more comprehensive  gene panel.   Ms. Jakes  was offered a common hereditary cancer panel (48 genes) and an expanded pan-cancer panel (85 genes). Ms. Gradel was informed of the benefits and limitations of each panel, including that expanded pan-cancer panels contain several preliminary evidence genes that do not have clear management guidelines at this point in time.  We also discussed that as the number of genes included on a panel increases, the chances of variants of uncertain significance increases.  After considering the benefits and limitations of each gene panel, Ms. Calip  elected to have a common hereditary cancer panel through [facility].  The Common Hereditary Cancers Panel offered by [facility] includes sequencing and/or deletion duplication testing of the following 48 genes: APC, ATM, AXIN2, BARD1, BMPR1A, BRCA1, BRCA2, BRIP1, CDH1, CDK4, CDKN2A (p14ARF), CDKN2A (p16INK4a), CHEK2, CTNNA1, DICER1, EPCAM (Deletion/duplication testing only), GREM1 (promoter region deletion/duplication testing only), KIT, MEN1, MLH1, MSH2, MSH3, MSH6, MUTYH, NBN, NF1,  NHTL1, PALB2, PDGFRA, PMS2, POLD1, POLE, PTEN, RAD50, RAD51C, RAD51D, RNF43, SDHB, SDHC, SDHD, SMAD4, SMARCA4. STK11, TP53, TSC1, TSC2, and VHL.  The following genes were evaluated for sequence changes only: SDHA and HOXB13 c.251G>A variant only.  Based on Ms. Gouge's personal history of cancer before the age of 62, she meets medical criteria for genetic testing. Despite that she meets criteria, she may still have an out of pocket cost. We discussed that if her out of pocket cost for testing is over $100, the laboratory will call and confirm whether she wants to proceed with testing.  If the out of pocket cost of testing is less than $100 she will be billed by the genetic testing laboratory.   PLAN: After considering the risks, benefits, and limitations, Ms. Drone provided informed consent to pursue genetic testing and the blood sample was sent to [facility] for analysis of the STAT + Common Hereditary Cancers Panel. Results should be available within approximately 1-2 weeks' time, at which point they will be disclosed by telephone to Ms. Brennen, as will any additional recommendations warranted by these results. Ms. Anglada will receive a summary of her genetic counseling visit and a copy of her results once available. This information will also be available in Epic.   Lastly, we encouraged Ms. Beavin to remain in contact with cancer genetics annually so that we can continuously update the family history and inform her of any changes in cancer genetics and testing that may be of benefit for this family.   Ms. Aguillard questions were answered to her satisfaction today. Our contact information was provided should additional questions or concerns arise. Thank you for the referral and allowing [place] to share in the care of your patient.   Kevontae Burgoon M. Joette Catching, [place], [place] Film/video [email or web address] (P) [phone]  The patient was seen for a total of 20 minutes in face-to-face genetic counseling.   This patient was discussed with Drs. Magrinat, Lindi Adie and/or Burr Medico who agrees with the above.  _________________________________________________ For Office Staff:  Number of people involved in session: 1 Was an Intern/ student involved with case: no

## 2020-09-25 NOTE — Patient Instructions (Signed)

## 2020-09-26 ENCOUNTER — Encounter: Payer: Self-pay | Admitting: *Deleted

## 2020-09-26 ENCOUNTER — Telehealth: Payer: Self-pay | Admitting: Hematology and Oncology

## 2020-09-26 DIAGNOSIS — C50412 Malignant neoplasm of upper-outer quadrant of left female breast: Secondary | ICD-10-CM | POA: Diagnosis not present

## 2020-09-26 NOTE — Telephone Encounter (Signed)
No 11/17 los. No changes made to pt's schedule.  

## 2020-09-30 ENCOUNTER — Ambulatory Visit (HOSPITAL_COMMUNITY)
Admission: RE | Admit: 2020-09-30 | Discharge: 2020-09-30 | Disposition: A | Discharge status: Home / Self Care | Payer: 59 | Source: Ambulatory Visit | Attending: Surgery | Admitting: Surgery

## 2020-09-30 ENCOUNTER — Encounter: Payer: Self-pay | Admitting: *Deleted

## 2020-09-30 ENCOUNTER — Telehealth: Payer: Self-pay | Admitting: *Deleted

## 2020-09-30 ENCOUNTER — Other Ambulatory Visit: Payer: Self-pay | Admitting: *Deleted

## 2020-09-30 ENCOUNTER — Other Ambulatory Visit: Payer: Self-pay

## 2020-09-30 DIAGNOSIS — C50412 Malignant neoplasm of upper-outer quadrant of left female breast: Secondary | ICD-10-CM

## 2020-09-30 DIAGNOSIS — Z170 Estrogen receptor positive status [ER+]: Secondary | ICD-10-CM

## 2020-09-30 DIAGNOSIS — D0511 Intraductal carcinoma in situ of right breast: Secondary | ICD-10-CM | POA: Diagnosis not present

## 2020-09-30 MED ORDER — GADOBUTROL 1 MMOL/ML IV SOLN
8.0000 mL | Freq: Once | INTRAVENOUS | Status: AC | PRN
  Administered 2020-09-30: 8 mL via INTRAVENOUS

## 2020-09-30 NOTE — Telephone Encounter (Signed)
Spoke to pt concerning [place] from 11.17.21. Denies questions or concerns regarding dx or treatment care plan. Encourage pt to call with needs. Received verbal understanding.

## 2020-10-01 ENCOUNTER — Encounter: Payer: Self-pay | Admitting: *Deleted

## 2020-10-01 ENCOUNTER — Other Ambulatory Visit: Payer: Self-pay | Admitting: Surgery

## 2020-10-01 ENCOUNTER — Other Ambulatory Visit: Payer: Self-pay | Admitting: Hematology and Oncology

## 2020-10-01 ENCOUNTER — Telehealth: Payer: Self-pay | Admitting: *Deleted

## 2020-10-01 DIAGNOSIS — C50912 Malignant neoplasm of unspecified site of left female breast: Secondary | ICD-10-CM

## 2020-10-01 DIAGNOSIS — C50412 Malignant neoplasm of upper-outer quadrant of left female breast: Secondary | ICD-10-CM

## 2020-10-01 NOTE — Telephone Encounter (Signed)
Called pt with MRI results and recommendations for MRI bx. Received verbal understanding. Denies further questions at this time.

## 2020-10-02 ENCOUNTER — Other Ambulatory Visit: Payer: Self-pay

## 2020-10-02 ENCOUNTER — Ambulatory Visit
Admission: RE | Admit: 2020-10-02 | Discharge: 2020-10-02 | Disposition: A | Discharge status: Home / Self Care | Payer: 59 | Source: Ambulatory Visit | Attending: Hematology and Oncology | Admitting: Hematology and Oncology

## 2020-10-02 ENCOUNTER — Encounter: Payer: Self-pay | Admitting: Genetic Counselor

## 2020-10-02 ENCOUNTER — Other Ambulatory Visit (HOSPITAL_COMMUNITY): Payer: Self-pay | Admitting: Diagnostic Radiology

## 2020-10-02 ENCOUNTER — Telehealth: Payer: Self-pay | Admitting: Genetic Counselor

## 2020-10-02 ENCOUNTER — Ambulatory Visit: Payer: Self-pay | Admitting: Genetic Counselor

## 2020-10-02 DIAGNOSIS — C50412 Malignant neoplasm of upper-outer quadrant of left female breast: Secondary | ICD-10-CM

## 2020-10-02 DIAGNOSIS — N6489 Other specified disorders of breast: Secondary | ICD-10-CM | POA: Diagnosis not present

## 2020-10-02 DIAGNOSIS — R928 Other abnormal and inconclusive findings on diagnostic imaging of breast: Secondary | ICD-10-CM | POA: Diagnosis not present

## 2020-10-02 DIAGNOSIS — Z1379 Encounter for other screening for genetic and chromosomal anomalies: Secondary | ICD-10-CM | POA: Insufficient documentation

## 2020-10-02 DIAGNOSIS — D0512 Intraductal carcinoma in situ of left breast: Secondary | ICD-10-CM | POA: Diagnosis not present

## 2020-10-02 DIAGNOSIS — Z170 Estrogen receptor positive status [ER+]: Secondary | ICD-10-CM

## 2020-10-02 DIAGNOSIS — Z800 Family history of malignant neoplasm of digestive organs: Secondary | ICD-10-CM

## 2020-10-02 MED ORDER — GADOBUTROL 1 MMOL/ML IV SOLN
10.0000 mL | Freq: Once | INTRAVENOUS | Status: AC | PRN
  Administered 2020-10-02: 10 mL via INTRAVENOUS

## 2020-10-02 MED FILL — DOXYCYCLINE HYCLATE 100 MG: 100 | 5 days supply | Qty: 10 | Fill #0

## 2020-10-02 NOTE — Telephone Encounter (Signed)
Contacted patient in attempt to disclose results of genetic testing.  LVM with contact information requesting a call back.  

## 2020-10-02 NOTE — Telephone Encounter (Signed)
Revealed negative genetic testing and variants of uncertain significance in POLE and APC.  Discussed that we do not know why she has breast cancer or why there is cancer in the family. It could be sporadic, due to a different gene that we are not testing, or maybe our current technology may not be able to pick something up.  It will be important for her to keep in contact with genetics to keep up with whether additional testing may be needed.

## 2020-10-02 NOTE — Progress Notes (Signed)
HPI:  Jennifer Davis was previously seen in the [facility] due to a personal and family history of cancer and concerns regarding a hereditary predisposition to cancer. Please refer to our prior cancer genetics clinic note for more information regarding our discussion, assessment and recommendations, at the time. Jennifer Davis recent genetic test results were disclosed to her, as were recommendations warranted by these results. These results and recommendations are discussed in more detail below.  CANCER HISTORY:  In November 2021, at the age of 44, [name] was diagnosed with invasive ductal carcinoma and ductal carcinoma in situ of the left breast (ER+/PR+/HER2-).  Oncology History  Malignant neoplasm of upper-outer quadrant of left breast in female, estrogen receptor positive ([place])  09/19/2020 Initial Diagnosis   Mammogram in 07/2018 showed probably benign bilateral breast masses that were stable on her last mammogram on 07/26/19. Mammogram on 08/28/20 showed the stable bilateral masses, a new 0.9cm mass at the 1 o'clock position in the left breast, and one abnormal left axillary lymph node with 0.6cm cortical thickening. 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    10/02/2020 Genetic Testing   Negative genetic testing: no pathogenic variants detected in [facility] Common-Hereditary Cancers Panel.  Variants of uncertain significance detected in APC c.6918T>A (p.Asp2306Glu) and POLE  c.2612G>C (p.Ser871Thr).  The report date is November 24. 2021.   The Common Hereditary Cancers Panel offered by [facility] includes sequencing and/or deletion duplication testing of the following 48 genes: APC, ATM, AXIN2, BARD1, BMPR1A, BRCA1, BRCA2, BRIP1, CDH1, CDK4, CDKN2A (p14ARF), CDKN2A (p16INK4a), CHEK2, CTNNA1, DICER1, EPCAM (Deletion/duplication testing only), GREM1 (promoter region deletion/duplication  testing only), KIT, MEN1, MLH1, MSH2, MSH3, MSH6, MUTYH, NBN, NF1, NHTL1, PALB2, PDGFRA, PMS2, POLD1, POLE, PTEN, RAD50, RAD51C, RAD51D, RNF43, SDHB, SDHC, SDHD, SMAD4, SMARCA4. STK11, TP53, TSC1, TSC2, and VHL.  The following genes were evaluated for sequence changes only: SDHA and HOXB13 c.251G>A variant only.     FAMILY HISTORY:  We obtained a detailed, 4-generation family history.  Significant diagnoses are listed below: Family History  Problem Relation Age of Onset  . Colon cancer Mother 9  . Colon cancer Other        MGM's niece, dx unknown age  . Colon cancer Other        MGM's nephew; dx unknown age    Jennifer Davis has two sons and one daughter, all without a history of cancer.  She has two sisters, ages 80 and 73, who have not had cancer.  Jennifer Davis mother, age 67, was diagnosed with cancer at age 62.  Jennifer Davis maternal grandfather had lung cancer and passed away at age 13.  Jennifer Davis reported two distant maternal cousins with a history of colon cancer at an unknown age.  No other maternal family history of cancer was reported.  Jennifer Davis father passed away at age 5 and did not have cancer.  No paternal family history of cancer was reported.   Jennifer Davis is unaware of previous family history of genetic testing for hereditary cancer risks. Patient's maternal ancestors are of [place]  descent, and paternal ancestors are of [place] descent. There is no reported Ashkenazi Jewish ancestry. There is no known consanguinity.  GENETIC TEST RESULTS: Genetic testing reported out on October 02, 2020.  The [facility] Common Hereditary Cancers Panel found no pathogenic mutations. The Common Hereditary Cancers Panel offered by [facility]  includes sequencing and/or deletion duplication testing of the following 48 genes: APC, ATM, AXIN2, BARD1, BMPR1A, BRCA1, BRCA2, BRIP1, CDH1, CDK4, CDKN2A (p14ARF), CDKN2A (p16INK4a), CHEK2, CTNNA1, DICER1, EPCAM (Deletion/duplication testing only), GREM1 (promoter  region deletion/duplication testing only), KIT, MEN1, MLH1, MSH2, MSH3, MSH6, MUTYH, NBN, NF1, NHTL1, PALB2, PDGFRA, PMS2, POLD1, POLE, PTEN, RAD50, RAD51C, RAD51D, RNF43, SDHB, SDHC, SDHD, SMAD4, SMARCA4. STK11, TP53, TSC1, TSC2, and VHL.  The following genes were evaluated for sequence changes only: SDHA and HOXB13 c.251G>A variant only.  The test report has been scanned into EPIC and is located under the Molecular Pathology section of the Results Review tab.  A portion of the result report is included below for reference.     We discussed with Jennifer Davis that because current genetic testing is not perfect, it is possible there may be a gene mutation in one of these genes that current testing cannot detect, but that chance is small.  We also discussed, that there could be another gene that has not yet been discovered, or that we have not yet tested, that is responsible for the cancer diagnoses in the family. It is also possible there is a hereditary cause for the cancer in the family that Ms. Bertino did not inherit and therefore was not identified in her testing.  Therefore, it is important to remain in touch with cancer genetics in the future so that we can continue to offer Jennifer Davis the most up to date genetic testing.   Genetic testing did identify two variants of uncertain significance (VUS) - one in the APC gene called c.6918T>A (p.Asp2306Glu) and a second in the POLE gene called c.2612G>C (p.Ser871Thr). At this time, it is unknown if these variants are associated with increased cancer risk or if they are normal findings, but most variants such as these get reclassified to being inconsequential. They should not be used to make medical management decisions. With time, we suspect the lab will determine the significance of these variants, if any. If we do learn more about them, we will try to contact Jennifer Davis to discuss it further. However, it is important to stay in touch with [place] periodically and keep the  address and phone number up to date.    ADDITIONAL GENETIC TESTING: We discussed with Jennifer Davis that there are other genes that are associated with increased cancer risk that can be analyzed. Should Ms. Machamer wish to pursue additional genetic testing, we are happy to discuss and coordinate this testing, at any time.    CANCER SCREENING RECOMMENDATIONS: Ms. Ailey test result is considered negative (normal).  This means that we have not identified a hereditary cause for her personal and family history of cancer at this time. Most cancers happen by chance and this negative test suggests that her cancer may fall into this category.    While reassuring, this does not definitively rule out a hereditary predisposition to cancer. It is still possible that there could be genetic mutations that are undetectable by current technology. There could be genetic mutations in genes that have not been tested or identified to increase cancer risk.  Therefore, it is recommended she continue to follow the cancer management and screening guidelines provided by her oncology and primary healthcare provider.   An individual's cancer risk and medical management are not determined by genetic test results alone. Overall cancer risk assessment incorporates additional factors, including personal medical history, family history, and any available genetic information that may result in a personalized plan for  cancer prevention and surveillance  RECOMMENDATIONS FOR FAMILY MEMBERS:  Individuals in this family might be at some increased risk of developing cancer, over the general population risk, simply due to the family history of cancer.  We recommended women in this family have a yearly mammogram beginning at age 32, or 67 years younger than the earliest onset of cancer, an annual clinical breast exam, and perform monthly breast self-exams. Women in this family should also have a gynecological exam as recommended by their primary  provider. All family members should be referred for colonoscopy starting at age 70.  FOLLOW-UP: Lastly, we discussed with Ms. Deyoung that cancer genetics is a rapidly advancing field and it is possible that new genetic tests will be appropriate for her and/or her family members in the future. We encouraged her to remain in contact with cancer genetics on an annual basis so we can update her personal and family histories and let her know of advances in cancer genetics that may benefit this family.   Our contact number was provided. Ms. Piccirilli questions were answered to her satisfaction, and she knows she is welcome to call us at anytime with additional questions or concerns.   Charmelle Soh M. Joette Catching, [place], [place] Film/video [email or web address] (P) [phone]

## 2020-10-04 ENCOUNTER — Encounter: Payer: Self-pay | Admitting: Hematology and Oncology

## 2020-10-07 ENCOUNTER — Telehealth: Payer: Self-pay | Admitting: *Deleted

## 2020-10-07 ENCOUNTER — Encounter: Payer: Self-pay | Admitting: *Deleted

## 2020-10-07 NOTE — Telephone Encounter (Signed)
Received Mammaprint results of HIGH RISK on CORE Bx. Physician team aware.

## 2020-10-08 ENCOUNTER — Other Ambulatory Visit: Payer: Self-pay

## 2020-10-08 ENCOUNTER — Inpatient Hospital Stay (HOSPITAL_BASED_OUTPATIENT_CLINIC_OR_DEPARTMENT_OTHER): Payer: 59 | Admitting: Hematology and Oncology

## 2020-10-08 ENCOUNTER — Other Ambulatory Visit: Payer: Self-pay | Admitting: Hematology and Oncology

## 2020-10-08 ENCOUNTER — Encounter: Payer: Self-pay | Admitting: *Deleted

## 2020-10-08 ENCOUNTER — Telehealth: Payer: Self-pay | Admitting: *Deleted

## 2020-10-08 DIAGNOSIS — C50412 Malignant neoplasm of upper-outer quadrant of left female breast: Secondary | ICD-10-CM

## 2020-10-08 DIAGNOSIS — Z170 Estrogen receptor positive status [ER+]: Secondary | ICD-10-CM | POA: Diagnosis not present

## 2020-10-08 DIAGNOSIS — R293 Abnormal posture: Secondary | ICD-10-CM | POA: Diagnosis not present

## 2020-10-08 MED ORDER — TAMOXIFEN CITRATE 10 MG PO TABS: 10.0000 mg | ORAL_TABLET | Freq: Every day | ORAL | 0 refills | Status: DC

## 2020-10-08 MED FILL — TAMOXIFEN 10 MG TABLET: 10 | 30 days supply | Qty: 30 | Fill #0

## 2020-10-08 NOTE — Progress Notes (Signed)
Patient Care Team: Wenda Low, MD as PCP - General (Internal Medicine) Rockwell Germany, RN as Oncology Nurse Navigator Mauro Kaufmann, RN as Oncology Nurse Navigator Coralie Keens, MD as Consulting Physician (General Surgery) Nicholas Lose, MD as Consulting Physician (Hematology and Oncology) Gery Pray, MD as Consulting Physician (Radiation Oncology)  DIAGNOSIS:  Encounter Diagnosis  Name Primary?  . Malignant neoplasm of upper-outer quadrant of left breast in female, estrogen receptor positive ([place])     SUMMARY OF ONCOLOGIC HISTORY: Oncology History  Malignant neoplasm of upper-outer quadrant of left breast in female, estrogen receptor positive ([place])  09/19/2020 Initial Diagnosis   Mammogram in 07/2018 showed probably benign bilateral breast masses that were stable on her last mammogram on 07/26/19. Mammogram on 08/28/20 showed the stable bilateral masses, a new 0.9cm mass at the 1 o'clock position in the left breast, and one abnormal left axillary lymph node with 0.6cm cortical thickening. 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    10/02/2020 Genetic Testing   Negative genetic testing: no pathogenic variants detected in [facility] Common-Hereditary Cancers Panel.  Variants of uncertain significance detected in APC c.6918T>A (p.Asp2306Glu) and POLE  c.2612G>C (p.Ser871Thr).  The report date is November 24. 2021.   The Common Hereditary Cancers Panel offered by [facility] includes sequencing and/or deletion duplication testing of the following 48 genes: APC, ATM, AXIN2, BARD1, BMPR1A, BRCA1, BRCA2, BRIP1, CDH1, CDK4, CDKN2A (p14ARF), CDKN2A (p16INK4a), CHEK2, CTNNA1, DICER1, EPCAM (Deletion/duplication testing only), GREM1 (promoter region deletion/duplication testing only), KIT, MEN1, MLH1, MSH2, MSH3, MSH6, MUTYH, NBN, NF1, NHTL1, PALB2, PDGFRA, PMS2, POLD1, POLE, PTEN, RAD50,  RAD51C, RAD51D, RNF43, SDHB, SDHC, SDHD, SMAD4, SMARCA4. STK11, TP53, TSC1, TSC2, and VHL.  The following genes were evaluated for sequence changes only: SDHA and HOXB13 c.251G>A variant only.     CHIEF COMPLIANT: Follow-up to discuss results of MammaPrint  INTERVAL HISTORY: Jennifer Davis is a 44-year with above-mentioned history of ER/PR positive breast cancer with axillary lymph node also being positive who underwent MammaPrint testing and is here to discuss results of MammaPrint.  The MammaPrint came back as high risk.  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  Because of this she needs a mastectomy.  She is scheduled for surgery on 11/14/2019.  She is here to discuss the adjuvant chemotherapy plan.   ALLERGIES:  has No Known Allergies.  MEDICATIONS:  Current Outpatient Medications  Medication Sig Dispense Refill  . azithromycin (ZITHROMAX Z-PAK) 250 MG tablet Take 1 tablet (250 mg total) by mouth daily. Take 2 tablets on the first day of treatment. Then take 1 tablet per day for the next four days. 6 tablet 0  . busPIRone (BUSPAR) 30 MG tablet Take 1 tablet (30 mg total) by mouth 2 (two) times daily. 60 tablet 5  . cetirizine (ZYRTEC) 10 MG tablet Take 10 mg by mouth daily.    . DULoxetine (CYMBALTA) 60 MG capsule Take 2 capsules (120 mg total) by mouth daily. 60 capsule 5  . EPINEPHrine (EPIPEN 2-PAK) 0.3 mg/0.3 mL IJ SOAJ injection Inject 0.3 mLs (0.3 mg total) into the muscle once as needed for up to 1 dose (for severe allergic reaction). CAll 911 immediately if you have to use this medicine 1 each 1  . gabapentin (NEURONTIN) 100 MG capsule Take 1 capsule (100  mg total) by mouth 3 (three) times daily as needed. 90 capsule 1  . melatonin 5 MG TABS Take 5 mg by mouth at bedtime as needed.    . montelukast (SINGULAIR) 10 MG tablet Take 1 tablet by mouth daily.  5  . Multiple Vitamins-Minerals  (MULTIVITAMIN PO) Take 2 tablets by mouth daily. Skin/hair/nail supplement.    Marland Kitchen PROAIR HFA 108 (90 Base) MCG/ACT inhaler Inhale 2 puffs into the lungs as needed.  0  . SYMBICORT 160-4.5 MCG/ACT inhaler Inhale 2 puffs into the lungs 2 (two) times daily.  5  . tamoxifen (NOLVADEX) 10 MG tablet Take 1 tablet (10 mg total) by mouth daily. 30 tablet 0   No current facility-administered medications for this visit.    PHYSICAL EXAMINATION: ECOG PERFORMANCE STATUS: 1 - Symptomatic but completely ambulatory  Vitals:   10/08/20 1606  BP: (!) 142/92  Pulse: (!) 104  Resp: 19  Temp: 98.4 F (36.9 C)  SpO2: 99%   Filed Weights   10/08/20 1606  Weight: 220 lb 6.4 oz (100 kg)      LABORATORY DATA:  I have reviewed the data as listed CMP Latest Ref Rng & Units 09/25/2020  Glucose 70 - 99 mg/dL 143(H)  BUN 6 - 20 mg/dL 8  Creatinine 0.44 - 1.00 mg/dL 0.82  Sodium 135 - 145 mmol/L 139  Potassium 3.5 - 5.1 mmol/L 3.7  Chloride 98 - 111 mmol/L 106  CO2 22 - 32 mmol/L 22  Calcium 8.9 - 10.3 mg/dL 8.7(L)  Total Protein 6.5 - 8.1 g/dL 7.0  Total Bilirubin 0.3 - 1.2 mg/dL 0.3  Alkaline Phos 38 - 126 U/L 79  AST 15 - 41 U/L 13(L)  ALT 0 - 44 U/L 13    Lab Results  Component Value Date   WBC 9.9 09/25/2020   HGB 11.7 (L) 09/25/2020   HCT 36.3 09/25/2020   MCV 86.4 09/25/2020   PLT 426 (H) 09/25/2020   NEUTROABS 6.1 09/25/2020    ASSESSMENT & PLAN:  Malignant neoplasm of upper-outer quadrant of left breast in female, estrogen receptor positive ([place]) 09/20/2019: Screening mammogram on 08/28/20 showed the stable bilateral masses, a new 0.9cm mass at the 1 o'clock position in the left breast, and one abnormal left axillary lymph node with 0.6cm cortical thickening. 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  MammaPrint: High risk: Luminal type B, 5-year metastasis free survival with chemo  and hormone therapy: 93%, absolute benefit from chemo greater than 12%, average 10-year risk of recurrence untreated: 29%  Treatment plan: 1.  Mastectomy with targeted node dissection 2.  adjuvant chemotherapy with dose dense Adriamycin and Cytoxan followed by Taxol 3.  Adjuvant radiation 4.  Followed by adjuvant antiestrogen therapy  Chemotherapy Counseling: I discussed the risks and benefits of chemotherapy including the risks of nausea/ vomiting, risk of infection from low WBC count, fatigue due to chemo or anemia, bruising or bleeding due to low platelets, mouth sores, loss/ change in taste and decreased appetite. Liver and kidney function will be monitored through out chemotherapy as abnormalities in liver and kidney function may be a side effect of treatment. Cardiac dysfunction due to Adriamycin were discussed in detail. Risk of permanent bone marrow dysfunction and leukemia due to chemo were also discussed.  Surgery has been scheduled for 11/14/2019. Follow-up 1 week later to discuss pathology report.   Because it will  take another month for her surgery, I started on 10 mg of tamoxifen today.  She will stop this 1 week before surgery.  She understands risks and benefits of tamoxifen.  Total time for today's visit along with review of chart and documentation: 45 minutes   No orders of the defined types were placed in this encounter.  The patient has a good understanding of the overall plan. she agrees with it. she will call with any problems that may develop before the next visit here. Total time spent: 30 mins including face to face time and time spent for planning, charting and co-ordination of care   Harriette Ohara, MD 10/08/20

## 2020-10-08 NOTE — Telephone Encounter (Signed)
Spoke to Jennifer Davis regarding Mammarpint results. Informed high risk and chemo would be recommended based on these results. Received verbal understanding. Scheduled and confirmed appt to see Dr. Lindi Adie on 11/30 at 4pm. Denies further questions at this time.

## 2020-10-08 NOTE — Assessment & Plan Note (Signed)
09/20/2019: Screening mammogram on 08/28/20 showed the stable bilateral masses, a new 0.9cm mass at the 1 o'clock position in the left breast, and one abnormal left axillary lymph node with 0.6cm cortical thickening. 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  MammaPrint: High risk: Luminal type B, 5-year metastasis free survival with chemo and hormone therapy: 93%, absolute benefit from chemo greater than 12%, average 10-year risk of recurrence untreated: 29%  Treatment plan: 1.  Mastectomy with targeted node dissection 2.  adjuvant chemotherapy with dose dense Adriamycin and Cytoxan followed by Taxol 3.  Adjuvant radiation 4.  Followed by adjuvant antiestrogen therapy  Chemotherapy Counseling: I discussed the risks and benefits of chemotherapy including the risks of nausea/ vomiting, risk of infection from low WBC count, fatigue due to chemo or anemia, bruising or bleeding due to low platelets, mouth sores, loss/ change in taste and decreased appetite. Liver and kidney function will be monitored through out chemotherapy as abnormalities in liver and kidney function may be a side effect of treatment. Cardiac dysfunction due to Adriamycin were discussed in detail. Risk of permanent bone marrow dysfunction and leukemia due to chemo were also discussed.  Surgery has been scheduled for 11/14/2019. Follow-up 1 week later to discuss pathology report.

## 2020-10-10 ENCOUNTER — Encounter: Payer: Self-pay | Admitting: *Deleted

## 2020-10-10 ENCOUNTER — Telehealth: Payer: Self-pay | Admitting: *Deleted

## 2020-10-10 NOTE — Telephone Encounter (Signed)
Pt called to discuss sx decision. Pt would like to proceed with bilateral mastectomy with reconstruction. Physician team notified

## 2020-10-11 ENCOUNTER — Telehealth: Payer: Self-pay | Admitting: Hematology and Oncology

## 2020-10-11 NOTE — Telephone Encounter (Signed)
Scheduled per 11/30 los. Called and spoke with pt, confirmed 1/12 appt

## 2020-10-15 ENCOUNTER — Other Ambulatory Visit: Payer: Self-pay

## 2020-10-15 ENCOUNTER — Encounter: Payer: Self-pay | Admitting: Plastic Surgery

## 2020-10-15 ENCOUNTER — Ambulatory Visit: Payer: 59 | Admitting: Plastic Surgery

## 2020-10-15 VITALS — BP 149/79 | HR 98 | Temp 98.3°F | Ht 59.0 in | Wt 221.0 lb

## 2020-10-15 DIAGNOSIS — F32A Depression, unspecified: Secondary | ICD-10-CM

## 2020-10-15 DIAGNOSIS — C50412 Malignant neoplasm of upper-outer quadrant of left female breast: Secondary | ICD-10-CM

## 2020-10-15 DIAGNOSIS — J45909 Unspecified asthma, uncomplicated: Secondary | ICD-10-CM | POA: Diagnosis not present

## 2020-10-15 DIAGNOSIS — Z170 Estrogen receptor positive status [ER+]: Secondary | ICD-10-CM

## 2020-10-15 NOTE — Progress Notes (Signed)
Patient ID: Jennifer Davis, female    DOB: Mar 21, 1976, 44 y.o.   MRN: [id number]   Chief Complaint  Patient presents with  . Breast Cancer    The patient is a 44 year old female here for a consultation for breast reconstruction.  She was diagnosed with left breast DCIS that is estrogen and progesterone positive.  She has an axillary lymph node that is positive as well.  Her MammaPrint came back as high risk.  She had an MRI which showed multiple nonmass enhancing lesions with DCIS and a complex sclerosing lesion.  She is planning on at least a left mastectomy with chemotherapy and radiation surgery is tentatively planned for January.  She is 4 feet 11 inches tall and weighs 221 pounds.  Her preoperative bra size is a 40 D.  She is not a smoker and does not have diabetes.  Her surgeon is Dr. [name].  Her husband is with her today and is an excellent support.  She has grade 2 ptosis of both breasts.  The lesion may be too close to her nipple areolar to save the left areola.  The patient is considering a right mastectomy.  She is currently working for [employer] in the [employer] with the [employer].   Review of Systems  Constitutional: Negative for activity change and appetite change.  HENT: Negative.   Eyes: Negative.   Respiratory: Negative for chest tightness and shortness of breath.   Cardiovascular: Negative.  Negative for leg swelling.  Gastrointestinal: Negative for abdominal distention and abdominal pain.  Endocrine: Negative.   Genitourinary: Negative.   Musculoskeletal: Positive for back pain.  Neurological: Negative.   Hematological: Negative.     Past Medical History:  Diagnosis Date  . Anxiety   . Asthma   . Breast cancer [facility])     Past Surgical History:  Procedure Laterality Date  . CESAREAN SECTION     x3      Current Outpatient Medications:  .  busPIRone (BUSPAR) 30 MG tablet, Take 1 tablet (30 mg total) by mouth 2 (two) times daily., Disp: 60 tablet, Rfl: 5 .  cetirizine  (ZYRTEC) 10 MG tablet, Take 10 mg by mouth daily., Disp: , Rfl:  .  DULoxetine (CYMBALTA) 60 MG capsule, Take 2 capsules (120 mg total) by mouth daily., Disp: 60 capsule, Rfl: 5 .  EPINEPHrine (EPIPEN 2-PAK) 0.3 mg/0.3 mL IJ SOAJ injection, Inject 0.3 mLs (0.3 mg total) into the muscle once as needed for up to 1 dose (for severe allergic reaction). CAll 911 immediately if you have to use this medicine, Disp: 1 each, Rfl: 1 .  gabapentin (NEURONTIN) 100 MG capsule, Take 1 capsule (100 mg total) by mouth 3 (three) times daily as needed., Disp: 90 capsule, Rfl: 1 .  melatonin 5 MG TABS, Take 5 mg by mouth at bedtime as needed., Disp: , Rfl:  .  montelukast (SINGULAIR) 10 MG tablet, Take 1 tablet by mouth daily., Disp: , Rfl: 5 .  Multiple Vitamins-Minerals (MULTIVITAMIN PO), Take 2 tablets by mouth daily. Skin/hair/nail supplement., Disp: , Rfl:  .  PROAIR HFA 108 (90 Base) MCG/ACT inhaler, Inhale 2 puffs into the lungs as needed., Disp: , Rfl: 0 .  SYMBICORT 160-4.5 MCG/ACT inhaler, Inhale 2 puffs into the lungs 2 (two) times daily., Disp: , Rfl: 5 .  tamoxifen (NOLVADEX) 10 MG tablet, Take 1 tablet (10 mg total) by mouth daily., Disp: 30 tablet, Rfl: 0   Objective:   Vitals:   10/15/20  1055  BP: (!) 149/79  Pulse: 98  Temp: 98.3 F (36.8 C)  SpO2: 97%    Physical Exam Vitals and nursing note reviewed.  Constitutional:      Appearance: Normal appearance.  HENT:     Head: Normocephalic and atraumatic.  Eyes:     Extraocular Movements: Extraocular movements intact.  Cardiovascular:     Rate and Rhythm: Normal rate.     Pulses: Normal pulses.  Pulmonary:     Effort: Pulmonary effort is normal.  Abdominal:     General: Abdomen is flat. There is no distension.     Tenderness: There is no abdominal tenderness.  Skin:    General: Skin is warm.     Capillary Refill: Capillary refill takes less than 2 seconds.  Neurological:     General: No focal deficit present.     Mental Status:  She is alert and oriented to person, place, and time.  Psychiatric:        Mood and Affect: Mood normal.        Behavior: Behavior normal.     Assessment & Plan:  Malignant neoplasm of upper-outer quadrant of left breast in female, estrogen receptor positive (HCC)  Depression, unspecified depression type  Uncomplicated asthma, unspecified asthma severity, unspecified whether persistent  We had a detailed conversation about the patient's options for breast reconstruction. Several reconstruction options were explained to the patient.  It is important to remember that breast reconstruction is an optional procedure. Reconstruction often requires several stages of surgery and this means more than one operation.  The surgeries are often done several months apart.  The entire process from start to finish can take a year or more. The major goal of breast reconstruction is to look normal in clothing. There will always be scars and a difference noticeable without clothes.  This is true for asymmetries where both breasts will not be identical.  Surgery may be needed or desired to the non-cancerous breast in order to achieve better symmetry and satisfactory results.  Regardless of the reconstructive method, there is always risks and the possibility that the procedure will fail or have complications.  This could required additional surgeries.    We discussed the available methods of breast reconstruction and included:  1. Tissue expander with Acellular dermal matrix followed by implant based reconstruction. This can be done as one surgery or multiple surgeries.  2. Autologous reconstruction can include using a muscle or tissue from another area of the body for the reconstruction.  3. Combined procedures like the latissismus dorsi flaps that often uses the muscle with an expander or implant.  For each of the method discussed the risks, benefits, scars and recovery time were discussed in detail. Specific risks  included bleeding, infection, hematoma, seroma, scarring, pain, wound healing complications, flap loss, fat necrosis, capsular contracture, need for implant removal, donor site complications, bulge, hernia, umbilical necrosis, need for urgent reoperation, and need for dressing changes.   After the options were discussed we focused on the patient's desires and the procedure that was best for her based on all the information.  A total of 50 minutes of face-to-face time was spent in this encounter, of which >60% was spent in counseling.   I have reviewed the patient's chart and confirmed the above information is correct with the patient.  She wants to think things over.  We will plan on a telemetry visit in a week.  She is leaning towards a left mastectomy with implant-based  reconstruction.  We will email her the brochures to review over the next several days.  She will likely not be able to keep her left nipple areola.  She is aware that she can have symmetry surgery of the right  breast at the time of the implant exchange if she decides to have implant-based reconstruction.  Another option would be to have the symmetry surgery after she has completed the radiation for even better symmetry.  I have spoken with Dr. [name] and shared the above information as well.   [place]lem[place], DO

## 2020-10-17 ENCOUNTER — Telehealth: Payer: Self-pay | Admitting: *Deleted

## 2020-10-17 NOTE — Telephone Encounter (Signed)
Pt called to discuss appt with Dr. Marla Roe. Pt relate she is leaning towards implant based reconstruction. She will call this RN with her final decision. Physician team notified.

## 2020-10-17 NOTE — Telephone Encounter (Signed)
Pt called with surgery decision of implant based reconstruction. Physician team notified.

## 2020-10-18 ENCOUNTER — Other Ambulatory Visit: Payer: Self-pay | Admitting: Surgery

## 2020-10-18 DIAGNOSIS — Z853 Personal history of malignant neoplasm of breast: Secondary | ICD-10-CM

## 2020-10-18 MED FILL — SYMBICORT 160-4.5 MCG INH: 160-4.5 | 30 days supply | Qty: 10 | Fill #3

## 2020-10-18 MED FILL — DULoxetine HCL 60 MG CPEP: 60 | 30 days supply | Qty: 60 | Fill #0

## 2020-10-21 ENCOUNTER — Encounter: Payer: Self-pay | Admitting: *Deleted

## 2020-10-24 ENCOUNTER — Other Ambulatory Visit: Payer: Self-pay | Admitting: Surgery

## 2020-10-24 DIAGNOSIS — Z853 Personal history of malignant neoplasm of breast: Secondary | ICD-10-CM

## 2020-10-28 ENCOUNTER — Telehealth: Payer: 59 | Admitting: Plastic Surgery

## 2020-11-04 ENCOUNTER — Other Ambulatory Visit: Payer: Self-pay | Admitting: Hematology and Oncology

## 2020-11-04 MED ORDER — TAMOXIFEN CITRATE 10 MG PO TABS
10.0000 mg | ORAL_TABLET | Freq: Every day | ORAL | 0 refills | Status: DC
Start: 2020-11-04 — End: 2020-11-04

## 2020-11-04 MED FILL — TAMOXIFEN 10 MG TABLET: 10 | 30 days supply | Qty: 30 | Fill #0

## 2020-11-07 ENCOUNTER — Other Ambulatory Visit: Payer: Self-pay | Admitting: Surgery

## 2020-11-07 MED FILL — busPIRone HCL 30 MG TABS: 30 | 30 days supply | Qty: 60 | Fill #1

## 2020-11-11 ENCOUNTER — Other Ambulatory Visit (HOSPITAL_COMMUNITY): Payer: 59

## 2020-11-13 ENCOUNTER — Ambulatory Visit: Admit: 2020-11-13 | Payer: 59 | Admitting: Surgery

## 2020-11-13 ENCOUNTER — Inpatient Hospital Stay (HOSPITAL_COMMUNITY): Admission: RE | Admit: 2020-11-13 | Payer: 59 | Source: Ambulatory Visit

## 2020-11-13 SURGERY — BREAST LUMPECTOMY WITH RADIOACTIVE SEED AND AXILLARY LYMPH NODE DISSECTION
Anesthesia: General | Site: Breast | Laterality: Left

## 2020-11-17 NOTE — Progress Notes (Signed)
ICD-10-CM   1. Malignant neoplasm of upper-outer quadrant of left breast in female, estrogen receptor positive [facility])  C50.412    Z17.0       Patient ID: Jennifer Davis, female    DOB: [birth year], 45 y.o.   MRN: [id number]   History of Present Illness: Jennifer Davis is a 45 y.o.  female  with a history of left breast cancer.  She presents for preoperative evaluation for upcoming procedure, bilateral breast mastectomy with left lymph node dissection with Dr. [name] and bilateral breast placement of tissue expanders and Flex HD with Dr. Marla Roe, scheduled for 12/04/2020.  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  She had an MRI which showed multiple non-mass enhancement lesions with DCIS and a complex sclerosing lesion.  She is planning on a left mastectomy with chemotherapy and radiation.  She is 5 feet 4 inches tall and weighs 221 pounds.  Her preoperative bra size is a 40 D.  She has grade 2 ptosis of both breasts.  Patient reports she will have 5 months of chemotherapy followed by 5-6 weeks of radiation.  Job: Works for [employer] and the [employer] with the [employer]  PMH Significant for: Anxiety, asthma  The patient has not had problems with anesthesia. No previous surgeries.  Past Medical History: Allergies: No Known Allergies  Current Medications:  Current Outpatient Medications:  .  busPIRone (BUSPAR) 30 MG tablet, Take 1 tablet (30 mg total) by mouth 2 (two) times daily., Disp: 60 tablet, Rfl: 5 .  cetirizine (ZYRTEC) 10 MG tablet, Take 10 mg by mouth daily., Disp: , Rfl:  .  DULoxetine (CYMBALTA) 60 MG capsule, Take 2 capsules (120 mg total) by mouth daily., Disp: 60 capsule, Rfl: 5 .  EPINEPHrine (EPIPEN 2-PAK) 0.3 mg/0.3 mL IJ SOAJ injection, Inject 0.3 mLs (0.3 mg total) into the muscle once as needed for up to 1 dose (for severe allergic reaction). CAll 911 immediately if  you have to use this medicine, Disp: 1 each, Rfl: 1 .  gabapentin (NEURONTIN) 100 MG capsule, Take 1 capsule (100 mg total) by mouth 3 (three) times daily as needed., Disp: 90 capsule, Rfl: 1 .  melatonin 5 MG TABS, Take 5 mg by mouth at bedtime as needed., Disp: , Rfl:  .  montelukast (SINGULAIR) 10 MG tablet, Take 1 tablet by mouth daily., Disp: , Rfl: 5 .  Multiple Vitamins-Minerals (MULTIVITAMIN PO), Take 2 tablets by mouth daily. Skin/hair/nail supplement., Disp: , Rfl:  .  PROAIR HFA 108 (90 Base) MCG/ACT inhaler, Inhale 2 puffs into the lungs as needed., Disp: , Rfl: 0 .  SYMBICORT 160-4.5 MCG/ACT inhaler, Inhale 2 puffs into the lungs 2 (two) times daily., Disp: , Rfl: 5 .  tamoxifen (NOLVADEX) 10 MG tablet, Take 1 tablet (10 mg total) by mouth daily., Disp: 30 tablet, Rfl: 0  Past Medical Problems: Past Medical History:  Diagnosis Date  . Anxiety   . Asthma   . Breast cancer [facility])     Past Surgical History: Past Surgical History:  Procedure Laterality Date  . CESAREAN SECTION     x3    Social History: Social History   Socioeconomic History  . Marital status: Married    Spouse name: Not on file  . Number of children: 3  . Years of education: associates  . Highest education level: Not on file  Occupational History  . Occupation: Hydrologist: [employer]  Tobacco Use  . Smoking status: Former Smoker    Types: Cigarettes    Quit date: 01/09/2012    Years since quitting: 8.8  . Smokeless tobacco: Never Used  . Tobacco comment: social smoker  Substance and Sexual Activity  . Alcohol use: Not Currently    Alcohol/week: 0.0 standard drinks    Comment: very rare  . Drug use: Never  . Sexual activity: Not on file  Other Topics Concern  . Not on file  Social History Narrative   epworth sleepiness scale = 10 (01/09/2016)   Social Determinants of Health   Financial Resource Strain: Not on file  Food Insecurity: No Food Insecurity  . Worried About  Charity fundraiser in the Last Year: Never true  . Ran Out of Food in the Last Year: Never true  Transportation Needs: No Transportation Needs  . Lack of Transportation (Medical): No  . Lack of Transportation (Non-Medical): No  Physical Activity: Not on file  Stress: Not on file  Social Connections: Not on file  Intimate Partner Violence: Not on file    Family History: Family History  Problem Relation Age of Onset  . Hypertension Mother   . Fibromyalgia Mother   . Colon cancer Mother 93  . Asthma Sister        x2  . Yves Dill Parkinson White syndrome Maternal Aunt 60  . Hypertension Maternal Grandmother   . Hypertension Maternal Grandfather   . Cancer - Lung Maternal Grandfather   . Stroke Maternal Grandfather   . Colon cancer Other        MGM's niece, dx unknown age  . Colon cancer Other        MGM's nephew; dx unknown age  . Breast cancer Neg Hx     Review of Systems: Review of Systems  Constitutional: Negative for chills and fever.  HENT: Negative for congestion and sore throat.   Respiratory: Negative for cough and shortness of breath.   Cardiovascular: Negative for chest pain and palpitations.  Gastrointestinal: Negative for abdominal pain, nausea and vomiting.  Skin: Negative for itching and rash.    Physical Exam: Vital Signs BP (!) 149/96 (BP Location: Left Wrist, Patient Position: Sitting, Cuff Size: Normal)   Pulse 98   Ht 4' 11.5" (1.511 m)   Wt 218 lb 3.2 oz (99 kg)   LMP 10/28/2020 (Approximate)   SpO2 98%   BMI 43.33 kg/m  Physical Exam Vitals and nursing note reviewed.  Constitutional:      General: She is not in acute distress.    Appearance: Normal appearance. She is obese. She is not ill-appearing.  HENT:     Head: Normocephalic and atraumatic.  Eyes:     Extraocular Movements: Extraocular movements intact.  Cardiovascular:     Rate and Rhythm: Normal rate and regular rhythm.     Pulses: Normal pulses.     Heart sounds: Normal heart  sounds.  Pulmonary:     Effort: Pulmonary effort is normal.     Breath sounds: Normal breath sounds. No wheezing, rhonchi or rales.  Abdominal:     General: Bowel sounds are normal.     Palpations: Abdomen is soft.  Musculoskeletal:        General: No swelling. Normal range of motion.     Cervical back: Normal range of motion.  Skin:    General: Skin is warm and dry.  Coloration: Skin is not pale.     Findings: No erythema or rash.  Neurological:     General: No focal deficit present.     Mental Status: She is alert and oriented to person, place, and time.  Psychiatric:        Mood and Affect: Mood normal.        Behavior: Behavior normal.        Thought Content: Thought content normal.        Judgment: Judgment normal.     Assessment/Plan:  Ms. Dinkel scheduled for left breast mastectomy with lymph node dissection with Dr. [name] and left breast placement of tissue expander and Flex HD with Dr. Marla Roe.  Risks, benefits, and alternatives of procedure discussed, questions answered and consent obtained.    She is to stop Tamoxifen 1 week prior to surgery per Dr. Lindi Adie. (Note dated 10/08/20)  Smoking Status: Non-smoker; Counseling Given?  N/A Last Mammogram: 09/30/2020 MR; Results: Left breast biopsy-proven malignancy; right breast no MRI evidence of malignancy  Caprini Score: High; Risk Factors include: 45 year old female, breast cancer, BMI > 25, and length of planned surgery. Recommendation for mechanical and pharmacological prophylaxis during surgery. Encourage early ambulation.   Pictures obtained: 10/15/2020  Post-op Rx sent to pharmacy: Norco, Zofran, Keflex, Valium, Tylenol, IBU  Patient was provided with the tissue expander risks and General Surgical Risk consent document and Pain Medication Agreement prior to their appointment.  They had adequate time to read through the risk consent documents and Pain Medication Agreement. We also discussed them in person  together during this preop appointment. All of their questions were answered to their satisfaction.  Recommended calling if they have any further questions.  Risk consent form and Pain Medication Agreement to be scanned into patient's chart.  The risks that can be encountered with and after placement of a breast expander placement were discussed and include the following but not limited to these: bleeding, infection, delayed healing, anesthesia risks, skin sensation changes, injury to structures including nerves, blood vessels, and muscles which may be temporary or permanent, allergies to tape, suture materials and glues, blood products, topical preparations or injected agents, skin contour irregularities, skin discoloration and swelling, deep vein thrombosis, cardiac and pulmonary complications, pain, which may persist, fluid accumulation, wrinkling of the skin over the expander, changes in nipple or breast sensation, expander leakage or rupture, faulty position of the expander, persistent pain, formation of tight scar tissue around the expander (capsular contracture), possible need for revisional surgery or staged procedures.  Electronically signed by: Threasa Heads, PA-C 11/19/2020 4:02 PM

## 2020-11-17 NOTE — H&P (View-Only) (Signed)
ICD-10-CM   1. Malignant neoplasm of upper-outer quadrant of left breast in female, estrogen receptor positive [facility])  C50.412    Z17.0       Patient ID: Jennifer Davis, female    DOB: [birth year], 45 y.o.   MRN: [id number]   History of Present Illness: Jennifer Davis is a 45 y.o.  female  with a history of left breast cancer.  She presents for preoperative evaluation for upcoming procedure, bilateral breast mastectomy with left lymph node dissection with Dr. [name] and bilateral breast placement of tissue expanders and Flex HD with Dr. Marla Roe, scheduled for 12/04/2020.  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  She had an MRI which showed multiple non-mass enhancement lesions with DCIS and a complex sclerosing lesion.  She is planning on a left mastectomy with chemotherapy and radiation.  She is 5 feet 4 inches tall and weighs 221 pounds.  Her preoperative bra size is a 40 D.  She has grade 2 ptosis of both breasts.  Patient reports she will have 5 months of chemotherapy followed by 5-6 weeks of radiation.  Job: Works for [employer] and the [employer] with the [employer]  PMH Significant for: Anxiety, asthma  The patient has not had problems with anesthesia. No previous surgeries.  Past Medical History: Allergies: No Known Allergies  Current Medications:  Current Outpatient Medications:  .  busPIRone (BUSPAR) 30 MG tablet, Take 1 tablet (30 mg total) by mouth 2 (two) times daily., Disp: 60 tablet, Rfl: 5 .  cetirizine (ZYRTEC) 10 MG tablet, Take 10 mg by mouth daily., Disp: , Rfl:  .  DULoxetine (CYMBALTA) 60 MG capsule, Take 2 capsules (120 mg total) by mouth daily., Disp: 60 capsule, Rfl: 5 .  EPINEPHrine (EPIPEN 2-PAK) 0.3 mg/0.3 mL IJ SOAJ injection, Inject 0.3 mLs (0.3 mg total) into the muscle once as needed for up to 1 dose (for severe allergic reaction). CAll 911 immediately if  you have to use this medicine, Disp: 1 each, Rfl: 1 .  gabapentin (NEURONTIN) 100 MG capsule, Take 1 capsule (100 mg total) by mouth 3 (three) times daily as needed., Disp: 90 capsule, Rfl: 1 .  melatonin 5 MG TABS, Take 5 mg by mouth at bedtime as needed., Disp: , Rfl:  .  montelukast (SINGULAIR) 10 MG tablet, Take 1 tablet by mouth daily., Disp: , Rfl: 5 .  Multiple Vitamins-Minerals (MULTIVITAMIN PO), Take 2 tablets by mouth daily. Skin/hair/nail supplement., Disp: , Rfl:  .  PROAIR HFA 108 (90 Base) MCG/ACT inhaler, Inhale 2 puffs into the lungs as needed., Disp: , Rfl: 0 .  SYMBICORT 160-4.5 MCG/ACT inhaler, Inhale 2 puffs into the lungs 2 (two) times daily., Disp: , Rfl: 5 .  tamoxifen (NOLVADEX) 10 MG tablet, Take 1 tablet (10 mg total) by mouth daily., Disp: 30 tablet, Rfl: 0  Past Medical Problems: Past Medical History:  Diagnosis Date  . Anxiety   . Asthma   . Breast cancer [facility])     Past Surgical History: Past Surgical History:  Procedure Laterality Date  . CESAREAN SECTION     x3    Social History: Social History   Socioeconomic History  . Marital status: Married    Spouse name: Not on file  . Number of children: 3  . Years of education: associates  . Highest education level: Not on file  Occupational History  . 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 Smoking status: Former Smoker    Types: Cigarettes    Quit date: 01/09/2012    Years since quitting: 8.8  . Smokeless tobacco: Never Used  . Tobacco comment: social smoker  Substance and Sexual Activity  . Alcohol use: Not Currently    Alcohol/week: 0.0 standard drinks    Comment: very rare  . Drug use: Never  . Sexual activity: Not on file  Other Topics Concern  . Not on file  Social History Narrative   epworth sleepiness scale = 10 (01/09/2016)   Social Determinants of Health   Financial Resource Strain: Not on file  Food Insecurity: No Food Insecurity  . Worried About  Charity fundraiser in the Last Year: Never true  . Ran Out of Food in the Last Year: Never true  Transportation Needs: No Transportation Needs  . Lack of Transportation (Medical): No  . Lack of Transportation (Non-Medical): No  Physical Activity: Not on file  Stress: Not on file  Social Connections: Not on file  Intimate Partner Violence: Not on file    Family History: Family History  Problem Relation Age of Onset  . Hypertension Mother   . Fibromyalgia Mother   . Colon cancer Mother 87  . Asthma Sister        x2  . Yves Dill Parkinson White syndrome Maternal Aunt 22  . Hypertension Maternal Grandmother   . Hypertension Maternal Grandfather   . Cancer - Lung Maternal Grandfather   . Stroke Maternal Grandfather   . Colon cancer Other        MGM's niece, dx unknown age  . Colon cancer Other        MGM's nephew; dx unknown age  . Breast cancer Neg Hx     Review of Systems: Review of Systems  Constitutional: Negative for chills and fever.  HENT: Negative for congestion and sore throat.   Respiratory: Negative for cough and shortness of breath.   Cardiovascular: Negative for chest pain and palpitations.  Gastrointestinal: Negative for abdominal pain, nausea and vomiting.  Skin: Negative for itching and rash.    Physical Exam: Vital Signs BP (!) 149/96 (BP Location: Left Wrist, Patient Position: Sitting, Cuff Size: Normal)   Pulse 98   Ht 4' 11.5" (1.511 m)   Wt 218 lb 3.2 oz (99 kg)   LMP 10/28/2020 (Approximate)   SpO2 98%   BMI 43.33 kg/m  Physical Exam Vitals and nursing note reviewed.  Constitutional:      General: She is not in acute distress.    Appearance: Normal appearance. She is obese. She is not ill-appearing.  HENT:     Head: Normocephalic and atraumatic.  Eyes:     Extraocular Movements: Extraocular movements intact.  Cardiovascular:     Rate and Rhythm: Normal rate and regular rhythm.     Pulses: Normal pulses.     Heart sounds: Normal heart  sounds.  Pulmonary:     Effort: Pulmonary effort is normal.     Breath sounds: Normal breath sounds. No wheezing, rhonchi or rales.  Abdominal:     General: Bowel sounds are normal.     Palpations: Abdomen is soft.  Musculoskeletal:        General: No swelling. Normal range of motion.     Cervical back: Normal range of motion.  Skin:    General: Skin is warm and dry.  Coloration: Skin is not pale.     Findings: No erythema or rash.  Neurological:     General: No focal deficit present.     Mental Status: She is alert and oriented to person, place, and time.  Psychiatric:        Mood and Affect: Mood normal.        Behavior: Behavior normal.        Thought Content: Thought content normal.        Judgment: Judgment normal.     Assessment/Plan:  Ms. Dinkel scheduled for left breast mastectomy with lymph node dissection with Dr. [name] and left breast placement of tissue expander and Flex HD with Dr. Marla Roe.  Risks, benefits, and alternatives of procedure discussed, questions answered and consent obtained.    She is to stop Tamoxifen 1 week prior to surgery per Dr. Lindi Adie. (Note dated 10/08/20)  Smoking Status: Non-smoker; Counseling Given?  N/A Last Mammogram: 09/30/2020 MR; Results: Left breast biopsy-proven malignancy; right breast no MRI evidence of malignancy  Caprini Score: High; Risk Factors include: 45 year old female, breast cancer, BMI > 25, and length of planned surgery. Recommendation for mechanical and pharmacological prophylaxis during surgery. Encourage early ambulation.   Pictures obtained: 10/15/2020  Post-op Rx sent to pharmacy: Norco, Zofran, Keflex, Valium, Tylenol, IBU  Patient was provided with the tissue expander risks and General Surgical Risk consent document and Pain Medication Agreement prior to their appointment.  They had adequate time to read through the risk consent documents and Pain Medication Agreement. We also discussed them in person  together during this preop appointment. All of their questions were answered to their satisfaction.  Recommended calling if they have any further questions.  Risk consent form and Pain Medication Agreement to be scanned into patient's chart.  The risks that can be encountered with and after placement of a breast expander placement were discussed and include the following but not limited to these: bleeding, infection, delayed healing, anesthesia risks, skin sensation changes, injury to structures including nerves, blood vessels, and muscles which may be temporary or permanent, allergies to tape, suture materials and glues, blood products, topical preparations or injected agents, skin contour irregularities, skin discoloration and swelling, deep vein thrombosis, cardiac and pulmonary complications, pain, which may persist, fluid accumulation, wrinkling of the skin over the expander, changes in nipple or breast sensation, expander leakage or rupture, faulty position of the expander, persistent pain, formation of tight scar tissue around the expander (capsular contracture), possible need for revisional surgery or staged procedures.  Electronically signed by: Threasa Heads, PA-C 11/19/2020 4:02 PM

## 2020-11-19 ENCOUNTER — Ambulatory Visit (INDEPENDENT_AMBULATORY_CARE_PROVIDER_SITE_OTHER): Payer: 59 | Admitting: Plastic Surgery

## 2020-11-19 ENCOUNTER — Other Ambulatory Visit: Payer: Self-pay | Admitting: Plastic Surgery

## 2020-11-19 ENCOUNTER — Other Ambulatory Visit: Payer: Self-pay

## 2020-11-19 ENCOUNTER — Encounter: Payer: Self-pay | Admitting: Plastic Surgery

## 2020-11-19 VITALS — BP 149/96 | HR 98 | Ht 59.5 in | Wt 218.2 lb

## 2020-11-19 DIAGNOSIS — C50412 Malignant neoplasm of upper-outer quadrant of left female breast: Secondary | ICD-10-CM

## 2020-11-19 DIAGNOSIS — Z170 Estrogen receptor positive status [ER+]: Secondary | ICD-10-CM

## 2020-11-19 MED ORDER — ACETAMINOPHEN 500 MG PO TABS: 500.0000 mg | ORAL_TABLET | Freq: Four times a day (QID) | ORAL | 0 refills | Status: DC | PRN

## 2020-11-19 MED ORDER — CEPHALEXIN 500 MG PO CAPS: 500.0000 mg | ORAL_CAPSULE | Freq: Four times a day (QID) | ORAL | 0 refills | Status: DC

## 2020-11-19 MED ORDER — HYDROCODONE-ACETAMINOPHEN 5-325 MG PO TABS: 1.0000 | ORAL_TABLET | Freq: Three times a day (TID) | ORAL | 0 refills | Status: DC | PRN

## 2020-11-19 MED ORDER — ONDANSETRON 4 MG PO TBDP: 4.0000 mg | ORAL_TABLET | Freq: Three times a day (TID) | ORAL | 0 refills | Status: DC | PRN

## 2020-11-19 MED ORDER — DIAZEPAM 2 MG PO TABS: 2.0000 mg | ORAL_TABLET | Freq: Two times a day (BID) | ORAL | 0 refills | Status: DC | PRN

## 2020-11-19 MED ORDER — IBUPROFEN 600 MG PO TABS: 600.0000 mg | ORAL_TABLET | Freq: Four times a day (QID) | ORAL | 0 refills | Status: DC | PRN

## 2020-11-19 MED FILL — ONDANSETRON ODT 4 MG TABLET: 4 | 7 days supply | Qty: 20 | Fill #0

## 2020-11-19 MED FILL — diazePAM 2 MG TABS: 2 | 10 days supply | Qty: 20 | Fill #0

## 2020-11-19 MED FILL — DULoxetine HCL 60 MG CPEP: 60 | 30 days supply | Qty: 60 | Fill #1

## 2020-11-19 MED FILL — IBUPROFEN 600 MG TABLET: 600 | 7 days supply | Qty: 30 | Fill #0

## 2020-11-19 MED FILL — HYDROCODON-APAP 5-325: 5-325 | 7 days supply | Qty: 21 | Fill #0

## 2020-11-19 MED FILL — CEPHALEXIN 500 MG CAPSULE: 500 | 3 days supply | Qty: 12 | Fill #0

## 2020-11-19 NOTE — Progress Notes (Signed)
Patient Care Team: Wenda Low, MD as PCP - General (Internal Medicine) Rockwell Germany, RN as Oncology Nurse Navigator Mauro Kaufmann, RN as Oncology Nurse Navigator Coralie Keens, MD as Consulting Physician (General Surgery) Nicholas Lose, MD as Consulting Physician (Hematology and Oncology) Gery Pray, MD as Consulting Physician (Radiation Oncology)  DIAGNOSIS:    ICD-10-CM   1. Malignant neoplasm of upper-outer quadrant of left breast in female, estrogen receptor positive ([place])  C50.412    Z17.0     SUMMARY OF ONCOLOGIC HISTORY: Oncology History  Malignant neoplasm of upper-outer quadrant of left breast in female, estrogen receptor positive ([place])  09/19/2020 Initial Diagnosis   Mammogram in 07/2018 showed probably benign bilateral breast masses that were stable on her last mammogram on 07/26/19. Mammogram on 08/28/20 showed the stable bilateral masses, a new 0.9cm mass at the 1 o'clock position in the left breast, and one abnormal left axillary lymph node with 0.6cm cortical thickening. 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    10/02/2020 Genetic Testing   Negative genetic testing: no pathogenic variants detected in [facility] Common-Hereditary Cancers Panel.  Variants of uncertain significance detected in APC c.6918T>A (p.Asp2306Glu) and POLE  c.2612G>C (p.Ser871Thr).  The report date is November 24. 2021.   The Common Hereditary Cancers Panel offered by [facility] includes sequencing and/or deletion duplication testing of the following 48 genes: APC, ATM, AXIN2, BARD1, BMPR1A, BRCA1, BRCA2, BRIP1, CDH1, CDK4, CDKN2A (p14ARF), CDKN2A (p16INK4a), CHEK2, CTNNA1, DICER1, EPCAM (Deletion/duplication testing only), GREM1 (promoter region deletion/duplication testing only), KIT, MEN1, MLH1, MSH2, MSH3, MSH6, MUTYH, NBN, NF1, NHTL1, PALB2, PDGFRA, PMS2, POLD1, POLE, PTEN, RAD50,  RAD51C, RAD51D, RNF43, SDHB, SDHC, SDHD, SMAD4, SMARCA4. STK11, TP53, TSC1, TSC2, and VHL.  The following genes were evaluated for sequence changes only: SDHA and HOXB13 c.251G>A variant only.     CHIEF COMPLIANT: Follow-up of left breast cancer  INTERVAL HISTORY: Jennifer Davis is a 45 y.o. with above-mentioned history of left breast cancer who decided to undergo bilateral mastectomies with reconstruction which is scheduled for 12/04/20. She presents to the clinic today for follow-up.  She is tolerating tamoxifen extremely well without any problems or concerns.  She is anxious to get surgery done so that she can move onto the next phase and the treatment plan.  ALLERGIES:  has No Known Allergies.  MEDICATIONS:  Current Outpatient Medications  Medication Sig Dispense Refill  . acetaminophen (TYLENOL) 500 MG tablet Take 1 tablet (500 mg total) by mouth every 6 (six) hours as needed. For use AFTER surgery 30 tablet 0  . busPIRone (BUSPAR) 30 MG tablet Take 1 tablet (30 mg total) by mouth 2 (two) times daily. 60 tablet 5  . cephALEXin (KEFLEX) 500 MG capsule Take 1 capsule (500 mg total) by mouth 4 (four) times daily for 3 days. For use AFTER Surgery 12 capsule 0  . cetirizine (ZYRTEC) 10 MG tablet Take 10 mg by mouth daily.    . DULoxetine (CYMBALTA) 60 MG capsule Take 2 capsules (120 mg total) by mouth daily. 60 capsule 5  . EPINEPHrine (EPIPEN 2-PAK) 0.3 mg/0.3 mL IJ SOAJ injection Inject 0.3 mLs (0.3 mg total) into the muscle once as needed for up to 1 dose (for severe allergic reaction). CAll 911 immediately if you have to use this medicine 1 each 1  . gabapentin (NEURONTIN) 100 MG capsule Take 1 capsule (100 mg total) by  mouth 3 (three) times daily as needed. 90 capsule 1  . HYDROcodone-acetaminophen (NORCO) 5-325 MG tablet Take 1 tablet by mouth every 8 (eight) hours as needed for up to 7 days for severe pain. For use AFTER Surgery 21 tablet 0  . ibuprofen (ADVIL) 600 MG tablet Take 1 tablet  (600 mg total) by mouth every 6 (six) hours as needed for mild pain or moderate pain. For use AFTER surgery 30 tablet 0  . melatonin 5 MG TABS Take 5 mg by mouth at bedtime as needed.    . montelukast (SINGULAIR) 10 MG tablet Take 1 tablet by mouth daily.  5  . Multiple Vitamins-Minerals (MULTIVITAMIN PO) Take 2 tablets by mouth daily. Skin/hair/nail supplement.    . ondansetron (ZOFRAN-ODT) 4 MG disintegrating tablet Take 1 tablet (4 mg total) by mouth every 8 (eight) hours as needed for nausea or vomiting. 20 tablet 0  . PROAIR HFA 108 (90 Base) MCG/ACT inhaler Inhale 2 puffs into the lungs as needed.  0  . SYMBICORT 160-4.5 MCG/ACT inhaler Inhale 2 puffs into the lungs 2 (two) times daily.  5  . tamoxifen (NOLVADEX) 10 MG tablet Take 1 tablet (10 mg total) by mouth daily. 30 tablet 0   No current facility-administered medications for this visit.    PHYSICAL EXAMINATION: ECOG PERFORMANCE STATUS: 1 - Symptomatic but completely ambulatory  Vitals:   11/20/20 1023  BP: 130/72  Pulse: 90  Resp: 18  Temp: (!) 97.5 F (36.4 C)  SpO2: 98%   Filed Weights   11/20/20 1023  Weight: 218 lb (98.9 kg)    LABORATORY DATA:  I have reviewed the data as listed CMP Latest Ref Rng & Units 09/25/2020  Glucose 70 - 99 mg/dL 143(H)  BUN 6 - 20 mg/dL 8  Creatinine 0.44 - 1.00 mg/dL 0.82  Sodium 135 - 145 mmol/L 139  Potassium 3.5 - 5.1 mmol/L 3.7  Chloride 98 - 111 mmol/L 106  CO2 22 - 32 mmol/L 22  Calcium 8.9 - 10.3 mg/dL 8.7(L)  Total Protein 6.5 - 8.1 g/dL 7.0  Total Bilirubin 0.3 - 1.2 mg/dL 0.3  Alkaline Phos 38 - 126 U/L 79  AST 15 - 41 U/L 13(L)  ALT 0 - 44 U/L 13    Lab Results  Component Value Date   WBC 9.9 09/25/2020   HGB 11.7 (L) 09/25/2020   HCT 36.3 09/25/2020   MCV 86.4 09/25/2020   PLT 426 (H) 09/25/2020   NEUTROABS 6.1 09/25/2020    ASSESSMENT & PLAN:  Malignant neoplasm of upper-outer quadrant of left breast in female, estrogen receptor positive  ([place]) 09/20/2019: Screening mammogram on 08/28/20 showed the stable bilateral masses, a new 0.9cm mass at the 1 o'clock position in the left breast, and one abnormal left axillary lymph node with 0.6cm cortical thickening. 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 MammaPrint: High risk: Luminal type B, 5-year metastasis free survival with chemo and hormone therapy: 93%, absolute benefit from chemo greater than 12%, average 10-year risk of recurrence untreated: 29%  Treatment plan: 1.  Mastectomy with targeted node dissection 2. adjuvant chemotherapy with dose dense Adriamycin and Cytoxan followed by Taxol 3.Adjuvant radiation 4.Followed by adjuvant antiestrogen therapy ------------------------------------------------------------------------------------------------------------- Mastectomy and reconstruction of been scheduled for 12/04/2020 I will see her 12/11/2020 for follow-up Tamoxifen toxicities: No major side effects to tamoxifen.   She plans to switch her  antidepressant to Effexor at a later time.  I recommended participation in nausea clinical trial when she starts chemotherapy. [place] [address]: Treatment of refractory nausea.  After first cycle of chemo if patient experience chemo induced nausea and vomiting the randomized from cycle 2 to Aloxi plus Dex plus olanzapine or placebo plus Compazine or placebo plus placebo prior to chemo and take home medications for day 2 today for of Dex plus olanzapine or placebo and Compazine or placebo every 8 hours.  If patient does not have nausea after cycle 1, then the trial is complete.  Return to clinic after surgery to discuss starting chemo.  No orders of the defined types were placed in this encounter.  The patient has a good understanding of the overall plan. she agrees with it. she will call with any problems that may develop before the next  visit here.  Total time spent: 30 mins including face to face time and time spent for planning, charting and coordination of care  Nicholas Lose, MD 11/20/2020  I, Cloyde Reams Dorshimer, am acting as scribe for Dr. Nicholas Lose.  I have reviewed the above documentation for accuracy and completeness, and I agree with the above.

## 2020-11-20 ENCOUNTER — Inpatient Hospital Stay: Payer: 59 | Attending: Hematology and Oncology | Admitting: Hematology and Oncology

## 2020-11-20 ENCOUNTER — Encounter: Payer: Self-pay | Admitting: *Deleted

## 2020-11-20 ENCOUNTER — Other Ambulatory Visit: Payer: Self-pay

## 2020-11-20 DIAGNOSIS — C50412 Malignant neoplasm of upper-outer quadrant of left female breast: Secondary | ICD-10-CM | POA: Insufficient documentation

## 2020-11-20 DIAGNOSIS — Z170 Estrogen receptor positive status [ER+]: Secondary | ICD-10-CM

## 2020-11-20 DIAGNOSIS — Z9013 Acquired absence of bilateral breasts and nipples: Secondary | ICD-10-CM | POA: Diagnosis not present

## 2020-11-20 DIAGNOSIS — Z79899 Other long term (current) drug therapy: Secondary | ICD-10-CM | POA: Insufficient documentation

## 2020-11-20 DIAGNOSIS — Z9221 Personal history of antineoplastic chemotherapy: Secondary | ICD-10-CM | POA: Insufficient documentation

## 2020-11-20 DIAGNOSIS — Z923 Personal history of irradiation: Secondary | ICD-10-CM | POA: Diagnosis not present

## 2020-11-20 NOTE — Assessment & Plan Note (Signed)
09/20/2019: Screening mammogram on 08/28/20 showed the stable bilateral masses, a new 0.9cm mass at the 1 o'clock position in the left breast, and one abnormal left axillary lymph node with 0.6cm cortical thickening. 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 MammaPrint: High risk: Luminal type B, 5-year metastasis free survival with chemo and hormone therapy: 93%, absolute benefit from chemo greater than 12%, average 10-year risk of recurrence untreated: 29%  Treatment plan: 1.  Mastectomy with targeted node dissection 2. adjuvant chemotherapy with dose dense Adriamycin and Cytoxan followed by Taxol 3.Adjuvant radiation 4.Followed by adjuvant antiestrogen therapy -------------------------------------------------------------------------------------------------------------

## 2020-11-20 NOTE — Research (Signed)
Trial:  TESTING THE ADDITION OF TWO APPROVED DRUGS FOR PERSISTENT NAUSEA ([place] [address])  DCP-001 USE OF A CLINICAL TRIAL SCREENING TOOL TO ADDRESS CANCER HEALTH DISPARITIES IN THE NCI COMMUNITY ONCOLOGY RESEARCH PROGRAM ([facility])  11/20/2020 11:30AM  Patient Jennifer Davis was identified by Dr. Lindi Adie as a potential candidate for the above listed study.  This Clinical Research Nurse along with clinical research coordinator Jennifer Davis met with Jennifer Davis, [id number], on 11/20/20 in a manner and location (exam room) that ensures patient privacy to discuss participation in the above listed research study.  Patient is Unaccompanied.  A copy of the informed consent document (protocol version 05/26/2019) and separate HIPAA Authorization was provided to the patient.  Patient read, speaks, and understands [place].    Patient was provided with the business card of this Nurse and encouraged to contact the research team with any questions.  Approximately 20 minutes were spent with the patient reviewing the informed consent documents.  Patient was provided the option of taking informed consent documents home to review and was encouraged to review at their convenience with their support network, including other care providers. Patient took the consent documents home to review.   DCP-001 DISCUSSION: Following review of the [facility] consent, we briefly discussed potential DCP-001 participation should she decide to participate in [place]. The voluntary nature of participation and the project purpose were reviewed. Jennifer Davis understands that the DCP-001 study is a one-time collection of demographic variables. Jennifer Davis denies having any questions.   Current plan is for Jennifer Davis to call me directly with a participation decision once she has reviewed the consents and discussed with her family. I asked if I may reach out to her if I have not heard from her in a week or so and she states that will be ok. Jennifer Davis was escorted to the main entrance  and thanked for her time and consideration of this study.  Jennifer Davis. Jennifer Davis, BSN, RN, CIC 11/20/2020 11:54 AM

## 2020-11-26 ENCOUNTER — Encounter (HOSPITAL_BASED_OUTPATIENT_CLINIC_OR_DEPARTMENT_OTHER): Payer: Self-pay | Admitting: Surgery

## 2020-11-26 ENCOUNTER — Other Ambulatory Visit: Payer: Self-pay

## 2020-11-30 ENCOUNTER — Other Ambulatory Visit (HOSPITAL_COMMUNITY): Payer: 59

## 2020-12-02 ENCOUNTER — Other Ambulatory Visit (HOSPITAL_COMMUNITY): Payer: 59

## 2020-12-02 ENCOUNTER — Other Ambulatory Visit (HOSPITAL_COMMUNITY)
Admission: RE | Admit: 2020-12-02 | Discharge: 2020-12-02 | Disposition: A | Discharge status: Home / Self Care | Payer: 59 | Source: Ambulatory Visit | Attending: Surgery | Admitting: Surgery

## 2020-12-02 DIAGNOSIS — Z170 Estrogen receptor positive status [ER+]: Secondary | ICD-10-CM

## 2020-12-02 DIAGNOSIS — Z01812 Encounter for preprocedural laboratory examination: Secondary | ICD-10-CM | POA: Diagnosis not present

## 2020-12-02 DIAGNOSIS — Z20822 Contact with and (suspected) exposure to covid-19: Secondary | ICD-10-CM | POA: Insufficient documentation

## 2020-12-02 DIAGNOSIS — C50412 Malignant neoplasm of upper-outer quadrant of left female breast: Secondary | ICD-10-CM

## 2020-12-02 LAB — SARS CORONAVIRUS 2 (TAT 6-24 HRS): SARS Coronavirus 2: NEGATIVE

## 2020-12-02 NOTE — Research (Signed)
Trial:  TESTING THE ADDITION OF TWO APPROVED DRUGS FOR PERSISTENT NAUSEA ([place] [address])  DCP-001 USE OF A CLINICAL TRIAL SCREENING TOOL TO ADDRESS CANCER HEALTH DISPARITIES IN THE NCI COMMUNITY ONCOLOGY RESEARCH PROGRAM ([facility])  12/02/2020 1:05PM  OUTGOING CALL: Outgoing call to [name] as a followup to our prior discussion of potential voluntary participation in the [place] study. I verified that I was speaking with the correct person and was spoke for approximately ten minutes. Ardelle Park states she has not had time to review the provided materials but plans to do so soon: she is preparing for surgery. I advised her to review the materials at her convenience and encouraged her to call me with any questions: she verbalizes understanding.   The current plan is for Ardelle Park to call me directly with a participation decision once she has reviewed the consents. I asked if I may reach out to her if I have not heard from her by mid-February and she states that will be ok.   Dionne Bucy. Sharlett Iles, BSN, RN, CIC 12/02/2020 1:18 PM

## 2020-12-03 ENCOUNTER — Other Ambulatory Visit: Payer: Self-pay | Admitting: Surgery

## 2020-12-03 ENCOUNTER — Ambulatory Visit
Admission: RE | Admit: 2020-12-03 | Discharge: 2020-12-03 | Disposition: A | Discharge status: Home / Self Care | Payer: 59 | Source: Ambulatory Visit | Attending: Surgery | Admitting: Surgery

## 2020-12-03 ENCOUNTER — Other Ambulatory Visit: Payer: Self-pay

## 2020-12-03 DIAGNOSIS — Z853 Personal history of malignant neoplasm of breast: Secondary | ICD-10-CM

## 2020-12-03 DIAGNOSIS — C50912 Malignant neoplasm of unspecified site of left female breast: Secondary | ICD-10-CM | POA: Diagnosis not present

## 2020-12-03 DIAGNOSIS — C773 Secondary and unspecified malignant neoplasm of axilla and upper limb lymph nodes: Secondary | ICD-10-CM | POA: Diagnosis not present

## 2020-12-03 MED ORDER — ENSURE PRE-SURGERY PO LIQD: 296.0000 mL | Freq: Once | ORAL | Status: DC

## 2020-12-03 NOTE — H&P (Signed)
Jennifer Davis Appointment: 09/25/2020 9:00 AM Location: [facility] Patient #: [id number] DOB: Dec 18, 1975 Undefined / Language: Cleophus Molt / Race: Refused to Report/Unreported Female   History of Present Illness ( A. [name] MD; 09/25/2020 9:39 AM) The patient is a 45 year old female who presents with breast cancer. Chief complaint: Left breast invasive and in situ ductal carcinoma  This is a 45 year old female who presented for her screening mammography. She was found to have a mass of calcifications at the 1 o'clock position of the left breast. It measured 0.9 cm in size. She underwent a biopsy of this as well as a biopsy of an enlarged node. The biopsy of the mass showed both invasive and in situ ductal carcinoma. The lymph node was also positive. She was 90% ER positive, 60% PR positive, HER-2 negative, and the Ki-67 was 25%. She is otherwise healthy except for asthma. She has had no previous problems regarding her breast. There is no family history of breast cancer.       Past Surgical History Conni Slipper, RN; 09/25/2020 8:23 AM) Cesarean Section - 1   Diagnostic Studies History Conni Slipper, RN; 09/25/2020 8:23 AM) Colonoscopy  never Mammogram  within last year Pap Smear  1-5 years ago  Medication History Conni Slipper, RN; 09/25/2020 8:23 AM) Medications Reconciled  Social History Conni Slipper, RN; 09/25/2020 8:23 AM) Alcohol use  Occasional alcohol use. Caffeine use  Carbonated beverages, Coffee, Tea. No drug use  Tobacco use  Never smoker.  Family History Conni Slipper, RN; 09/25/2020 8:23 AM) Colon Cancer  Mother. Diabetes Mellitus  Mother, Sister. Heart Disease  Mother. Hypertension  Mother. Respiratory Condition  Sister. Thyroid problems  Mother.  Pregnancy / Birth History Conni Slipper, RN; 09/25/2020 8:23 AM) Age at menarche  31 years. Gravida  3 Length (months) of breastfeeding  3-6 Maternal age  24-25 Para   3 Regular periods   Other Problems Conni Slipper, RN; 09/25/2020 8:23 AM) Anxiety Disorder  Back Pain  Depression  Gastroesophageal Reflux Disease  Lump In Breast     Review of Systems Conni Slipper RN; 09/25/2020 8:23 AM) General Present- Night Sweats and Weight Gain. Not Present- Appetite Loss, Chills, Fatigue, Fever and Weight Loss. Skin Not Present- Change in Wart/Mole, Dryness, Hives, Jaundice, New Lesions, Non-Healing Wounds, Rash and Ulcer. HEENT Present- Seasonal Allergies and Wears glasses/contact lenses. Not Present- Earache, Hearing Loss, Hoarseness, Nose Bleed, Oral Ulcers, Ringing in the Ears, Sinus Pain, Sore Throat, Visual Disturbances and Yellow Eyes. Breast Present- Breast Mass and Breast Pain. Not Present- Nipple Discharge and Skin Changes. Cardiovascular Present- Leg Cramps and Rapid Heart Rate. Not Present- Chest Pain, Difficulty Breathing Lying Down, Palpitations, Shortness of Breath and Swelling of Extremities. Gastrointestinal Present- Bloating and Vomiting. Not Present- Abdominal Pain, Bloody Stool, Change in Bowel Habits, Chronic diarrhea, Constipation, Difficulty Swallowing, Excessive gas, Gets full quickly at meals, Hemorrhoids, Indigestion, Nausea and Rectal Pain. Female Genitourinary Not Present- Frequency, Nocturia, Painful Urination, Pelvic Pain and Urgency. Musculoskeletal Present- Back Pain. Not Present- Joint Pain, Joint Stiffness, Muscle Pain, Muscle Weakness and Swelling of Extremities. Neurological Not Present- Decreased Memory, Fainting, Headaches, Numbness, Seizures, Tingling, Tremor, Trouble walking and Weakness. Psychiatric Present- Anxiety and Depression. Not Present- Bipolar, Change in Sleep Pattern, Fearful and Frequent crying. Endocrine Present- Cold Intolerance. Not Present- Excessive Hunger, Hair Changes, Heat Intolerance, Hot flashes and New Diabetes. Hematology Not Present- Blood Thinners, Easy Bruising, Excessive bleeding, Gland problems,  HIV and Persistent Infections.   Physical Exam ( A.  [name] MD; 09/25/2020 9:39 AM) The physical exam findings are as follows: Note: She appears well on exam  There are no palpable breast masses. I cannot palpate any left axillary adenopathy. Her incisions from the stereotactic biopsy are healing well. The nipple areolar complex is normal    Assessment & Plan (Jadee Golebiewski A. [name] MD; 09/25/2020 9:43 AM) INVASIVE DUCTAL CARCINOMA OF BREAST, LEFT (C50.912) DUCTAL CARCINOMA IN SITU (DCIS) OF LEFT BREAST (D05.12) Impression: I have reviewed the patient's records in the electronic medical records. We also discussed her at length in our multidisciplinary breast cancer conference. I have reviewed her mammograms and ultrasound as well as the biopsy results. I had a long discussion with the patient and her husband regarding the diagnosis. Again, she has both invasive and in situ ductal carcinoma of the left breast. He also has a positive lymph node. From a surgical standpoint, we discussed his conservation versus mastectomy. Given the small size of the malignancy, it was recommended that she could undergo a radioactive seed guided left breast lumpectomy. We also discussed her lymph node status. At the same time of surgery she would have a targeted lymph node dissection. I discussed the surgical procedures with him in detail. I discussed the risk which includes but is not limited to bleeding, infection, injury to surrounding structures, the need for further surgery if margins or further nodes are positive, arm swelling, cardiopulmonary issues, etc. She would require postoperative radiation. Given her age, a preoperative MRI of the breast is recommended to rule out other abnormalities in both breasts. She will also have genetic counseling.  Addendum: After a further workup and discussion as well as discussion with plastic surgery, she now wishes to proceed with bilateral mastectomies with immediate  reconstruction as well as a radioactive seed targeted left axillary lymph node dissection and port a cath insertion. We can discuss the risks of this in detail.  Surgery is scheduled

## 2020-12-03 NOTE — Progress Notes (Signed)

## 2020-12-04 ENCOUNTER — Ambulatory Visit
Admission: RE | Admit: 2020-12-04 | Discharge: 2020-12-04 | Disposition: A | Discharge status: Home / Self Care | Payer: 59 | Source: Ambulatory Visit | Attending: Surgery | Admitting: Surgery

## 2020-12-04 ENCOUNTER — Ambulatory Visit (HOSPITAL_COMMUNITY): Payer: 59

## 2020-12-04 ENCOUNTER — Ambulatory Visit (HOSPITAL_BASED_OUTPATIENT_CLINIC_OR_DEPARTMENT_OTHER): Payer: 59 | Admitting: Anesthesiology

## 2020-12-04 ENCOUNTER — Encounter (HOSPITAL_BASED_OUTPATIENT_CLINIC_OR_DEPARTMENT_OTHER)
Admission: RE | Disposition: A | Discharge status: Home / Self Care | Payer: Self-pay | Source: Home / Self Care | Attending: Plastic Surgery

## 2020-12-04 ENCOUNTER — Encounter (HOSPITAL_BASED_OUTPATIENT_CLINIC_OR_DEPARTMENT_OTHER): Payer: Self-pay | Admitting: Surgery

## 2020-12-04 ENCOUNTER — Observation Stay (HOSPITAL_BASED_OUTPATIENT_CLINIC_OR_DEPARTMENT_OTHER)
Admission: RE | Admit: 2020-12-04 | Discharge: 2020-12-05 | Disposition: A | Discharge status: Home / Self Care | Payer: 59 | Attending: Plastic Surgery | Admitting: Plastic Surgery

## 2020-12-04 ENCOUNTER — Other Ambulatory Visit: Payer: Self-pay

## 2020-12-04 DIAGNOSIS — N6092 Unspecified benign mammary dysplasia of left breast: Secondary | ICD-10-CM | POA: Diagnosis not present

## 2020-12-04 DIAGNOSIS — N6091 Unspecified benign mammary dysplasia of right breast: Secondary | ICD-10-CM | POA: Diagnosis not present

## 2020-12-04 DIAGNOSIS — D0512 Intraductal carcinoma in situ of left breast: Secondary | ICD-10-CM | POA: Diagnosis not present

## 2020-12-04 DIAGNOSIS — N6022 Fibroadenosis of left breast: Secondary | ICD-10-CM | POA: Diagnosis not present

## 2020-12-04 DIAGNOSIS — Z170 Estrogen receptor positive status [ER+]: Secondary | ICD-10-CM | POA: Diagnosis not present

## 2020-12-04 DIAGNOSIS — C50919 Malignant neoplasm of unspecified site of unspecified female breast: Secondary | ICD-10-CM | POA: Diagnosis present

## 2020-12-04 DIAGNOSIS — C50912 Malignant neoplasm of unspecified site of left female breast: Secondary | ICD-10-CM | POA: Diagnosis not present

## 2020-12-04 DIAGNOSIS — C50412 Malignant neoplasm of upper-outer quadrant of left female breast: Secondary | ICD-10-CM | POA: Diagnosis not present

## 2020-12-04 DIAGNOSIS — D242 Benign neoplasm of left breast: Secondary | ICD-10-CM | POA: Diagnosis not present

## 2020-12-04 DIAGNOSIS — J45909 Unspecified asthma, uncomplicated: Secondary | ICD-10-CM | POA: Diagnosis not present

## 2020-12-04 DIAGNOSIS — Z419 Encounter for procedure for purposes other than remedying health state, unspecified: Secondary | ICD-10-CM

## 2020-12-04 DIAGNOSIS — Z853 Personal history of malignant neoplasm of breast: Secondary | ICD-10-CM

## 2020-12-04 DIAGNOSIS — N6082 Other benign mammary dysplasias of left breast: Secondary | ICD-10-CM | POA: Diagnosis not present

## 2020-12-04 DIAGNOSIS — Z452 Encounter for adjustment and management of vascular access device: Secondary | ICD-10-CM

## 2020-12-04 DIAGNOSIS — G8918 Other acute postprocedural pain: Secondary | ICD-10-CM | POA: Diagnosis not present

## 2020-12-04 DIAGNOSIS — C773 Secondary and unspecified malignant neoplasm of axilla and upper limb lymph nodes: Secondary | ICD-10-CM | POA: Diagnosis not present

## 2020-12-04 DIAGNOSIS — N6011 Diffuse cystic mastopathy of right breast: Secondary | ICD-10-CM | POA: Diagnosis not present

## 2020-12-04 DIAGNOSIS — R928 Other abnormal and inconclusive findings on diagnostic imaging of breast: Secondary | ICD-10-CM | POA: Diagnosis not present

## 2020-12-04 DIAGNOSIS — N6012 Diffuse cystic mastopathy of left breast: Secondary | ICD-10-CM | POA: Diagnosis not present

## 2020-12-04 DIAGNOSIS — J811 Chronic pulmonary edema: Secondary | ICD-10-CM | POA: Diagnosis not present

## 2020-12-04 HISTORY — DX: Gastro-esophageal reflux disease without esophagitis: K21.9

## 2020-12-04 HISTORY — PX: PORTACATH PLACEMENT: SHX2246

## 2020-12-04 HISTORY — PX: MASTECTOMY WITH AXILLARY LYMPH NODE DISSECTION: SHX5661

## 2020-12-04 HISTORY — PX: BREAST RECONSTRUCTION WITH PLACEMENT OF TISSUE EXPANDER AND FLEX HD (ACELLULAR HYDRATED DERMIS): SHX6295

## 2020-12-04 LAB — POCT PREGNANCY, URINE: Preg Test, Ur: NEGATIVE

## 2020-12-04 SURGERY — MASTECTOMY WITH AXILLARY LYMPH NODE DISSECTION
Anesthesia: General | Site: Chest | Laterality: Left

## 2020-12-04 MED ORDER — ACETAMINOPHEN 325 MG PO TABS
325.0000 mg | ORAL_TABLET | Freq: Four times a day (QID) | ORAL | Status: DC
  Administered 2020-12-04 – 2020-12-05 (×2): 325 mg via ORAL
  Filled 2020-12-04 (×2): qty 1

## 2020-12-04 MED ORDER — SODIUM CHLORIDE (PF) 0.9 % IJ SOLN
INTRAMUSCULAR | Status: AC
  Filled 2020-12-04: qty 10

## 2020-12-04 MED ORDER — DEXAMETHASONE SODIUM PHOSPHATE 10 MG/ML IJ SOLN
INTRAMUSCULAR | Status: AC
  Filled 2020-12-04: qty 1

## 2020-12-04 MED ORDER — PROPOFOL 10 MG/ML IV BOLUS
INTRAVENOUS | Status: DC | PRN
  Administered 2020-12-04: 200 mg via INTRAVENOUS

## 2020-12-04 MED ORDER — KCL IN DEXTROSE-NACL 20-5-0.45 MEQ/L-%-% IV SOLN
INTRAVENOUS | Status: DC
  Filled 2020-12-04 (×2): qty 1000

## 2020-12-04 MED ORDER — GABAPENTIN 300 MG PO CAPS: 300.0000 mg | ORAL_CAPSULE | ORAL | Status: AC

## 2020-12-04 MED ORDER — PHENYLEPHRINE HCL (PRESSORS) 10 MG/ML IV SOLN
INTRAVENOUS | Status: DC | PRN
  Administered 2020-12-04: 40 ug via INTRAVENOUS

## 2020-12-04 MED ORDER — ROPIVACAINE HCL 5 MG/ML IJ SOLN
INTRAMUSCULAR | Status: DC | PRN
  Administered 2020-12-04: 30 mL via PERINEURAL

## 2020-12-04 MED ORDER — DIPHENHYDRAMINE HCL 12.5 MG/5ML PO ELIX
12.5000 mg | ORAL_SOLUTION | Freq: Four times a day (QID) | ORAL | Status: DC | PRN
Start: 2020-12-04 — End: 2020-12-05

## 2020-12-04 MED ORDER — OXYCODONE HCL 5 MG/5ML PO SOLN: 5.0000 mg | Freq: Once | ORAL | Status: DC | PRN

## 2020-12-04 MED ORDER — AMISULPRIDE (ANTIEMETIC) 5 MG/2ML IV SOLN: 10.0000 mg | Freq: Once | INTRAVENOUS | Status: DC | PRN

## 2020-12-04 MED ORDER — PHENYLEPHRINE 40 MCG/ML (10ML) SYRINGE FOR IV PUSH (FOR BLOOD PRESSURE SUPPORT)
PREFILLED_SYRINGE | INTRAVENOUS | Status: AC
  Filled 2020-12-04: qty 10

## 2020-12-04 MED ORDER — SENNA 8.6 MG PO TABS: 1.0000 | ORAL_TABLET | Freq: Two times a day (BID) | ORAL | Status: DC

## 2020-12-04 MED ORDER — 0.9 % SODIUM CHLORIDE (POUR BTL) OPTIME
TOPICAL | Status: DC | PRN
  Administered 2020-12-04 (×2): 1000 mL

## 2020-12-04 MED ORDER — GABAPENTIN 300 MG PO CAPS
300.0000 mg | ORAL_CAPSULE | ORAL | Status: AC
  Administered 2020-12-04: 300 mg via ORAL

## 2020-12-04 MED ORDER — HYDROMORPHONE HCL 1 MG/ML IJ SOLN
0.2500 mg | INTRAMUSCULAR | Status: DC | PRN
Start: 2020-12-04 — End: 2020-12-05

## 2020-12-04 MED ORDER — ONDANSETRON HCL 4 MG/2ML IJ SOLN
INTRAMUSCULAR | Status: DC | PRN
  Administered 2020-12-04: 4 mg via INTRAVENOUS

## 2020-12-04 MED ORDER — EPHEDRINE 5 MG/ML INJ
INTRAVENOUS | Status: AC
  Filled 2020-12-04: qty 10

## 2020-12-04 MED ORDER — HYDROMORPHONE HCL 1 MG/ML IJ SOLN: 1.0000 mg | INTRAMUSCULAR | Status: DC | PRN

## 2020-12-04 MED ORDER — CHLORHEXIDINE GLUCONATE CLOTH 2 % EX PADS: 6.0000 | MEDICATED_PAD | Freq: Once | CUTANEOUS | Status: DC

## 2020-12-04 MED ORDER — HYDROCODONE-ACETAMINOPHEN 5-325 MG PO TABS
1.0000 | ORAL_TABLET | ORAL | Status: DC | PRN
  Administered 2020-12-04: 2 via ORAL
  Filled 2020-12-04: qty 2

## 2020-12-04 MED ORDER — ACETAMINOPHEN 500 MG PO TABS: 1000.0000 mg | ORAL_TABLET | ORAL | Status: AC

## 2020-12-04 MED ORDER — SODIUM CHLORIDE 0.9 % IV SOLN
INTRAVENOUS | Status: DC | PRN
  Administered 2020-12-04: 500 mL

## 2020-12-04 MED ORDER — CEFAZOLIN SODIUM-DEXTROSE 2-4 GM/100ML-% IV SOLN
2.0000 g | INTRAVENOUS | Status: AC
  Administered 2020-12-04: 2 g via INTRAVENOUS

## 2020-12-04 MED ORDER — BUPIVACAINE HCL (PF) 0.5 % IJ SOLN
INTRAMUSCULAR | Status: DC | PRN
  Administered 2020-12-04: 6 mL

## 2020-12-04 MED ORDER — ROCURONIUM BROMIDE 10 MG/ML (PF) SYRINGE
PREFILLED_SYRINGE | INTRAVENOUS | Status: AC
  Filled 2020-12-04: qty 10

## 2020-12-04 MED ORDER — BUPIVACAINE HCL (PF) 0.5 % IJ SOLN
INTRAMUSCULAR | Status: AC
  Filled 2020-12-04: qty 30

## 2020-12-04 MED ORDER — MIDAZOLAM HCL 2 MG/2ML IJ SOLN
2.0000 mg | Freq: Once | INTRAMUSCULAR | Status: AC
  Administered 2020-12-04: 2 mg via INTRAVENOUS

## 2020-12-04 MED ORDER — DIAZEPAM 2 MG PO TABS: 2.0000 mg | ORAL_TABLET | Freq: Two times a day (BID) | ORAL | Status: DC | PRN

## 2020-12-04 MED ORDER — ONDANSETRON 4 MG PO TBDP: 4.0000 mg | ORAL_TABLET | Freq: Four times a day (QID) | ORAL | Status: DC | PRN

## 2020-12-04 MED ORDER — GABAPENTIN 300 MG PO CAPS
ORAL_CAPSULE | ORAL | Status: AC
  Filled 2020-12-04: qty 1

## 2020-12-04 MED ORDER — MEPERIDINE HCL 25 MG/ML IJ SOLN: 6.2500 mg | INTRAMUSCULAR | Status: DC | PRN

## 2020-12-04 MED ORDER — HEPARIN (PORCINE) IN NACL 1000-0.9 UT/500ML-% IV SOLN
INTRAVENOUS | Status: AC
  Filled 2020-12-04: qty 500

## 2020-12-04 MED ORDER — CEFAZOLIN SODIUM-DEXTROSE 2-4 GM/100ML-% IV SOLN
INTRAVENOUS | Status: AC
  Filled 2020-12-04: qty 100

## 2020-12-04 MED ORDER — DEXAMETHASONE SODIUM PHOSPHATE 4 MG/ML IJ SOLN
INTRAMUSCULAR | Status: DC | PRN
  Administered 2020-12-04: 10 mg via INTRAVENOUS

## 2020-12-04 MED ORDER — OXYCODONE HCL 5 MG PO TABS: 5.0000 mg | ORAL_TABLET | Freq: Once | ORAL | Status: DC | PRN

## 2020-12-04 MED ORDER — ENSURE PRE-SURGERY PO LIQD: 296.0000 mL | Freq: Once | ORAL | Status: DC

## 2020-12-04 MED ORDER — ROCURONIUM BROMIDE 100 MG/10ML IV SOLN
INTRAVENOUS | Status: DC | PRN
  Administered 2020-12-04: 100 mg via INTRAVENOUS
  Administered 2020-12-04: 10 mg via INTRAVENOUS

## 2020-12-04 MED ORDER — SODIUM CHLORIDE 0.9 % IV SOLN
INTRAVENOUS | Status: AC
  Filled 2020-12-04: qty 10

## 2020-12-04 MED ORDER — ONDANSETRON HCL 4 MG/2ML IJ SOLN
INTRAMUSCULAR | Status: AC
  Filled 2020-12-04: qty 2

## 2020-12-04 MED ORDER — DIPHENHYDRAMINE HCL 50 MG/ML IJ SOLN
INTRAMUSCULAR | Status: DC | PRN
  Administered 2020-12-04: 12.5 mg via INTRAVENOUS

## 2020-12-04 MED ORDER — CEFAZOLIN SODIUM-DEXTROSE 2-4 GM/100ML-% IV SOLN: 2.0000 g | INTRAVENOUS | Status: AC

## 2020-12-04 MED ORDER — DIPHENHYDRAMINE HCL 50 MG/ML IJ SOLN
INTRAMUSCULAR | Status: AC
  Filled 2020-12-04: qty 1

## 2020-12-04 MED ORDER — MIDAZOLAM HCL 5 MG/5ML IJ SOLN
INTRAMUSCULAR | Status: DC | PRN
  Administered 2020-12-04 (×2): 1 mg via INTRAVENOUS

## 2020-12-04 MED ORDER — FENTANYL CITRATE (PF) 100 MCG/2ML IJ SOLN
INTRAMUSCULAR | Status: AC
  Filled 2020-12-04: qty 2

## 2020-12-04 MED ORDER — METHYLENE BLUE 0.5 % INJ SOLN
INTRAVENOUS | Status: AC
  Filled 2020-12-04: qty 10

## 2020-12-04 MED ORDER — SUGAMMADEX SODIUM 200 MG/2ML IV SOLN
INTRAVENOUS | Status: DC | PRN
  Administered 2020-12-04: 200 mg via INTRAVENOUS

## 2020-12-04 MED ORDER — DIPHENHYDRAMINE HCL 50 MG/ML IJ SOLN: 12.5000 mg | Freq: Four times a day (QID) | INTRAMUSCULAR | Status: DC | PRN

## 2020-12-04 MED ORDER — ACETAMINOPHEN 500 MG PO TABS
1000.0000 mg | ORAL_TABLET | ORAL | Status: AC
  Administered 2020-12-04: 1000 mg via ORAL

## 2020-12-04 MED ORDER — IBUPROFEN 200 MG PO TABS
400.0000 mg | ORAL_TABLET | Freq: Four times a day (QID) | ORAL | Status: DC
  Administered 2020-12-04 – 2020-12-05 (×2): 400 mg via ORAL
  Filled 2020-12-04 (×2): qty 2

## 2020-12-04 MED ORDER — CELECOXIB 200 MG PO CAPS
400.0000 mg | ORAL_CAPSULE | ORAL | Status: AC
  Administered 2020-12-04: 400 mg via ORAL

## 2020-12-04 MED ORDER — HEPARIN (PORCINE) IN NACL 2-0.9 UNITS/ML
INTRAMUSCULAR | Status: AC | PRN
  Administered 2020-12-04: 500 mL

## 2020-12-04 MED ORDER — FENTANYL CITRATE (PF) 100 MCG/2ML IJ SOLN
100.0000 ug | Freq: Once | INTRAMUSCULAR | Status: AC
  Administered 2020-12-04: 100 ug via INTRAVENOUS

## 2020-12-04 MED ORDER — ACETAMINOPHEN 500 MG PO TABS
ORAL_TABLET | ORAL | Status: AC
  Filled 2020-12-04: qty 2

## 2020-12-04 MED ORDER — LACTATED RINGERS IV SOLN: INTRAVENOUS | Status: DC

## 2020-12-04 MED ORDER — CELECOXIB 200 MG PO CAPS
ORAL_CAPSULE | ORAL | Status: AC
  Filled 2020-12-04: qty 2

## 2020-12-04 MED ORDER — CEFAZOLIN SODIUM-DEXTROSE 1-4 GM/50ML-% IV SOLN
1.0000 g | Freq: Three times a day (TID) | INTRAVENOUS | Status: DC
  Administered 2020-12-04 – 2020-12-05 (×3): 1 g via INTRAVENOUS
  Filled 2020-12-04 (×3): qty 50

## 2020-12-04 MED ORDER — HEPARIN SOD (PORK) LOCK FLUSH 100 UNIT/ML IV SOLN
INTRAVENOUS | Status: AC
  Filled 2020-12-04: qty 5

## 2020-12-04 MED ORDER — FENTANYL CITRATE (PF) 100 MCG/2ML IJ SOLN
INTRAMUSCULAR | Status: DC | PRN
  Administered 2020-12-04: 100 ug via INTRAVENOUS
  Administered 2020-12-04: 50 ug via INTRAVENOUS

## 2020-12-04 MED ORDER — PROMETHAZINE HCL 25 MG/ML IJ SOLN: 6.2500 mg | INTRAMUSCULAR | Status: DC | PRN

## 2020-12-04 MED ORDER — ONDANSETRON HCL 4 MG/2ML IJ SOLN: 4.0000 mg | Freq: Four times a day (QID) | INTRAMUSCULAR | Status: DC | PRN

## 2020-12-04 MED ORDER — MIDAZOLAM HCL 2 MG/2ML IJ SOLN
INTRAMUSCULAR | Status: AC
  Filled 2020-12-04: qty 2

## 2020-12-04 MED ORDER — LIDOCAINE 2% (20 MG/ML) 5 ML SYRINGE
INTRAMUSCULAR | Status: AC
  Filled 2020-12-04: qty 5

## 2020-12-04 MED ORDER — SUCCINYLCHOLINE CHLORIDE 200 MG/10ML IV SOSY
PREFILLED_SYRINGE | INTRAVENOUS | Status: AC
  Filled 2020-12-04: qty 10

## 2020-12-04 MED ORDER — ZOLPIDEM TARTRATE 5 MG PO TABS: 5.0000 mg | ORAL_TABLET | Freq: Every evening | ORAL | Status: DC | PRN

## 2020-12-04 MED ORDER — HEPARIN SOD (PORK) LOCK FLUSH 100 UNIT/ML IV SOLN
INTRAVENOUS | Status: DC | PRN
  Administered 2020-12-04: 500 [IU]

## 2020-12-04 MED ORDER — PROPOFOL 500 MG/50ML IV EMUL
INTRAVENOUS | Status: DC | PRN
Start: 2020-12-04 — End: 2020-12-04
  Administered 2020-12-04: 12.5 ug/kg/min via INTRAVENOUS

## 2020-12-04 MED ORDER — PROPOFOL 10 MG/ML IV BOLUS
INTRAVENOUS | Status: AC
  Filled 2020-12-04: qty 20

## 2020-12-04 MED ORDER — CELECOXIB 200 MG PO CAPS: 400.0000 mg | ORAL_CAPSULE | ORAL | Status: AC

## 2020-12-04 SURGICAL SUPPLY — 75 items
ADH SKN CLS APL DERMABOND .7 (GAUZE/BANDAGES/DRESSINGS) ×9
APL PRP STRL LF DISP 70% ISPRP (MISCELLANEOUS) ×9
APPLIER CLIP 9.375 MED OPEN (MISCELLANEOUS) ×4
APR CLP MED 9.3 20 MLT OPN (MISCELLANEOUS) ×3
BAG DECANTER FOR FLEXI CONT (MISCELLANEOUS) ×4 IMPLANT
BINDER BREAST LRG (GAUZE/BANDAGES/DRESSINGS) IMPLANT
BINDER BREAST MEDIUM (GAUZE/BANDAGES/DRESSINGS) IMPLANT
BINDER BREAST XLRG (GAUZE/BANDAGES/DRESSINGS) IMPLANT
BINDER BREAST XXLRG (GAUZE/BANDAGES/DRESSINGS) ×1 IMPLANT
BIOPATCH RED 1 DISK 7.0 (GAUZE/BANDAGES/DRESSINGS) ×5 IMPLANT
BLADE HEX COATED 2.75 (ELECTRODE) ×4 IMPLANT
BLADE SURG 10 STRL SS (BLADE) ×2 IMPLANT
BLADE SURG 11 STRL SS (BLADE) ×1 IMPLANT
BLADE SURG 15 STRL LF DISP TIS (BLADE) ×3 IMPLANT
BLADE SURG 15 STRL SS (BLADE) ×4
BNDG GAUZE ELAST 4 BULKY (GAUZE/BANDAGES/DRESSINGS) ×8 IMPLANT
CANISTER SUCT 1200ML W/VALVE (MISCELLANEOUS) ×4 IMPLANT
CHLORAPREP W/TINT 26 (MISCELLANEOUS) ×9 IMPLANT
CLIP APPLIE 9.375 MED OPEN (MISCELLANEOUS) ×3 IMPLANT
COVER BACK TABLE 60X90IN (DRAPES) ×4 IMPLANT
COVER MAYO STAND STRL (DRAPES) ×5 IMPLANT
COVER WAND RF STERILE (DRAPES) ×1 IMPLANT
DERMABOND ADVANCED (GAUZE/BANDAGES/DRESSINGS) ×3
DERMABOND ADVANCED .7 DNX12 (GAUZE/BANDAGES/DRESSINGS) ×6 IMPLANT
DRAIN CHANNEL 19F RND (DRAIN) ×8 IMPLANT
DRAPE C-ARM 42X72 X-RAY (DRAPES) ×4 IMPLANT
DRAPE LAPAROSCOPIC ABDOMINAL (DRAPES) ×5 IMPLANT
DRAPE UTILITY XL STRL (DRAPES) ×5 IMPLANT
DRSG OPSITE POSTOP 4X6 (GAUZE/BANDAGES/DRESSINGS) ×2 IMPLANT
DRSG PAD ABDOMINAL 8X10 ST (GAUZE/BANDAGES/DRESSINGS) ×8 IMPLANT
ELECT BLADE 4.0 EZ CLEAN MEGAD (MISCELLANEOUS) ×4
ELECT REM PT RETURN 9FT ADLT (ELECTROSURGICAL) ×8
ELECTRODE BLDE 4.0 EZ CLN MEGD (MISCELLANEOUS) ×3 IMPLANT
ELECTRODE REM PT RTRN 9FT ADLT (ELECTROSURGICAL) ×3 IMPLANT
EVACUATOR SILICONE 100CC (DRAIN) ×8 IMPLANT
GLOVE SURG ENC MOIS LTX SZ6.5 (GLOVE) ×8 IMPLANT
GLOVE SURG SIGNA 7.5 PF LTX (GLOVE) ×8 IMPLANT
GOWN STRL REUS W/ TWL LRG LVL3 (GOWN DISPOSABLE) ×9 IMPLANT
GOWN STRL REUS W/ TWL XL LVL3 (GOWN DISPOSABLE) ×3 IMPLANT
GOWN STRL REUS W/TWL LRG LVL3 (GOWN DISPOSABLE) ×20
GOWN STRL REUS W/TWL XL LVL3 (GOWN DISPOSABLE) ×8
GRAFT FLEX HD 6X16 PLIABLE (Tissue) ×2 IMPLANT
IMPL EXPANDER BREAST 535CC (Breast) IMPLANT
IMPLANT BREAST 535CC (Breast) ×2 IMPLANT
IMPLANT EXPANDER BREAST 535CC (Breast) ×6 IMPLANT
KIT FILL ASEPTIC TRANSFER (MISCELLANEOUS) ×1 IMPLANT
KIT FILL SYSTEM UNIVERSAL (SET/KITS/TRAYS/PACK) ×1 IMPLANT
NDL HYPO 25X1 1.5 SAFETY (NEEDLE) ×3 IMPLANT
NEEDLE HYPO 25X1 1.5 SAFETY (NEEDLE) ×4 IMPLANT
NS IRRIG 1000ML POUR BTL (IV SOLUTION) ×4 IMPLANT
PACK BASIN DAY SURGERY FS (CUSTOM PROCEDURE TRAY) ×4 IMPLANT
PAD FOAM SILICONE BACKED (GAUZE/BANDAGES/DRESSINGS) IMPLANT
PENCIL SMOKE EVACUATOR (MISCELLANEOUS) ×5 IMPLANT
PIN SAFETY STERILE (MISCELLANEOUS) ×4 IMPLANT
SLEEVE SCD COMPRESS KNEE MED (MISCELLANEOUS) ×4 IMPLANT
SPONGE LAP 18X18 RF (DISPOSABLE) ×10 IMPLANT
STAPLER VISISTAT 35W (STAPLE) ×1 IMPLANT
STRIP SUTURE WOUND CLOSURE 1/2 (MISCELLANEOUS) ×2 IMPLANT
SUT ETHILON 2 0 FS 18 (SUTURE) ×1 IMPLANT
SUT MNCRL AB 4-0 PS2 18 (SUTURE) ×5 IMPLANT
SUT MON AB 3-0 SH 27 (SUTURE) ×12
SUT MON AB 3-0 SH27 (SUTURE) IMPLANT
SUT MON AB 5-0 PS2 18 (SUTURE) ×2 IMPLANT
SUT PDS AB 2-0 CT2 27 (SUTURE) ×8 IMPLANT
SUT PROLENE 2 0 SH DA (SUTURE) ×1 IMPLANT
SUT SILK 3 0 PS 1 (SUTURE) ×2 IMPLANT
SUT VIC AB 3-0 SH 27 (SUTURE) ×8
SUT VIC AB 3-0 SH 27X BRD (SUTURE) IMPLANT
SUT VICRYL 3-0 CR8 SH (SUTURE) ×1 IMPLANT
SYR BULB IRRIG 60ML STRL (SYRINGE) ×4 IMPLANT
SYR CONTROL 10ML LL (SYRINGE) ×4 IMPLANT
TOWEL GREEN STERILE FF (TOWEL DISPOSABLE) ×8 IMPLANT
TUBE CONNECTING 20X1/4 (TUBING) ×4 IMPLANT
UNDERPAD 30X36 HEAVY ABSORB (UNDERPADS AND DIAPERS) ×8 IMPLANT
YANKAUER SUCT BULB TIP NO VENT (SUCTIONS) ×4 IMPLANT

## 2020-12-04 NOTE — Anesthesia Procedure Notes (Signed)
Anesthesia Regional Block: Pectoralis block   Pre-Anesthetic Checklist: ,, timeout performed, Correct Patient, Correct Site, Correct Laterality, Correct Procedure, Correct Position, site marked, Risks and benefits discussed,  Surgical consent,  Pre-op evaluation,  At surgeon's request and post-op pain management  Laterality: Left  Prep: chloraprep       Needles:  Injection technique: Single-shot  Needle Type: Stimiplex     Needle Length: 9cm  Needle Gauge: 21     Additional Needles:   Procedures:,,,, ultrasound used (permanent image in chart),,,,  Narrative:  Start time: 12/04/2020 8:03 AM End time: 12/04/2020 8:08 AM Injection made incrementally with aspirations every 5 mL.  Performed by: Personally  Anesthesiologist: Lynda Rainwater, MD

## 2020-12-04 NOTE — Progress Notes (Signed)
Assisted Dr. Miller with left, ultrasound guided, pectoralis block. Side rails up, monitors on throughout procedure. See vital signs in flow sheet. Tolerated Procedure well. 

## 2020-12-04 NOTE — Anesthesia Preprocedure Evaluation (Signed)
Anesthesia Evaluation  Patient identified by MRN, date of birth, ID band Patient awake    Reviewed: Allergy & Precautions, H&P , NPO status , Patient's Chart, lab work & pertinent test results  Airway Mallampati: II  TM Distance: >3 FB Neck ROM: Full    Dental no notable dental hx.    Pulmonary asthma , former smoker,    Pulmonary exam normal breath sounds clear to auscultation       Cardiovascular negative cardio ROS Normal cardiovascular exam Rhythm:Regular Rate:Normal     Neuro/Psych Anxiety Depression negative neurological ROS  negative psych ROS   GI/Hepatic Neg liver ROS, GERD  ,  Endo/Other  Morbid obesity  Renal/GU negative Renal ROS  negative genitourinary   Musculoskeletal negative musculoskeletal ROS (+)   Abdominal (+) + obese,   Peds negative pediatric ROS (+)  Hematology negative hematology ROS (+)   Anesthesia Other Findings Breast Cancer  Reproductive/Obstetrics negative OB ROS                             Anesthesia Physical Anesthesia Plan  ASA: III  Anesthesia Plan: General   Post-op Pain Management:  Regional for Post-op pain   Induction: Intravenous  PONV Risk Score and Plan: 3 and Ondansetron, Dexamethasone, Midazolam and Treatment may vary due to age or medical condition  Airway Management Planned: Oral ETT  Additional Equipment:   Intra-op Plan:   Post-operative Plan: Extubation in OR  Informed Consent: I have reviewed the patients History and Physical, chart, labs and discussed the procedure including the risks, benefits and alternatives for the proposed anesthesia with the patient or authorized representative who has indicated his/her understanding and acceptance.     Dental advisory given  Plan Discussed with: CRNA  Anesthesia Plan Comments:         Anesthesia Quick Evaluation

## 2020-12-04 NOTE — Anesthesia Postprocedure Evaluation (Signed)
Anesthesia Post Note  Patient: Jennifer Davis  Procedure(s) Performed: BILATERAL MASTECTOMIES, RADIOACTIVE SEED GUIDED TARGETED LEFT AXILLARY NODE DISSECTION (Left Breast) INSERTION PORT-A-CATH LEFT SUBCLAVIAN (Left Chest) IMMEDIATE BILATERAL BREAST RECONSTRUCTION WITH PLACEMENT OF TISSUE EXPANDER AND FLEX HD (ACELLULAR HYDRATED DERMIS) (Bilateral )     Patient location during evaluation: PACU Anesthesia Type: General Level of consciousness: awake and alert Pain management: pain level controlled Vital Signs Assessment: post-procedure vital signs reviewed and stable Respiratory status: spontaneous breathing, nonlabored ventilation and respiratory function stable Cardiovascular status: blood pressure returned to baseline and stable Postop Assessment: no apparent nausea or vomiting Anesthetic complications: no   No complications documented.  Last Vitals:  Vitals:   12/04/20 1245 12/04/20 1316  BP: 126/78 140/79  Pulse: 95 88  Resp: 17 18  Temp:  37.1 C  SpO2: 100% 96%    Last Pain:  Vitals:   12/04/20 1316  TempSrc:   PainSc: 3                  Lynda Rainwater

## 2020-12-04 NOTE — Interval H&P Note (Signed)
History and Physical Interval Note:  12/04/2020 7:15 AM  Jennifer Davis  has presented today for surgery, with the diagnosis of LEFT BREAST CANCER.  The various methods of treatment have been discussed with the patient and family. After consideration of risks, benefits and other options for treatment, the patient has consented to  Procedure(s): BILATERAL BREAST MASTECTOMY WITH LEFT TARGETED RADIOACTIVE SEED GUIDED LYMPH NODE DISSECTION (Bilateral) INSERTION PORT-A-CATH WITH ULTRASOUND GUIDANCE (N/A) IMMEDIATE BILATERAL BREAST RECONSTRUCTION WITH PLACEMENT OF TISSUE EXPANDER AND FLEX HD (ACELLULAR HYDRATED DERMIS) (Bilateral) as a surgical intervention.  The patient's history has been reviewed, patient examined, no change in status, stable for surgery.  I have reviewed the patient's chart and labs.  Questions were answered to the patient's satisfaction.     Loel Lofty Maryrose Colvin

## 2020-12-04 NOTE — Op Note (Signed)
Op report    DATE OF OPERATION:  12/04/2020  LOCATION: [facility]  SURGICAL DIVISION: Plastic Surgery  PREOPERATIVE DIAGNOSES:  1. Left Breast cancer.    POSTOPERATIVE DIAGNOSES:  1. Left Breast cancer.   PROCEDURE:  1. Bilateral immediate breast reconstruction with placement of Acellular Dermal Matrix and tissue expanders.  SURGEON: Claire Sanger Dillingham, DO  ASSISTANT: Phoebe Sharps, PA  ANESTHESIA:  General.   COMPLICATIONS: None.   IMPLANTS: Left - Mentor 535 cc. Ref #[id number].  250 cc of injectable saline placed in the expander. Right - Mentor 535 cc. Ref #[id number].  250 cc of injectable saline placed in the expander. Acellular Dermal Matrix 6 x 16 cm Flex HD x 2  INDICATIONS FOR PROCEDURE:  The patient, Jennifer Davis, is a 45 y.o. female born on 03-Dec-1975, is here for  immediate first stage breast reconstruction with placement of bilateral tissue expander and Acellular dermal matrix. MRN: [id number]  CONSENT:  Informed consent was obtained directly from the patient. Risks, benefits and alternatives were fully discussed. Specific risks including but not limited to bleeding, infection, hematoma, seroma, scarring, pain, implant infection, implant extrusion, capsular contracture, asymmetry, wound healing problems, and need for further surgery were all discussed. The patient did have an ample opportunity to have her questions answered to her satisfaction.   DESCRIPTION OF PROCEDURE:  The patient was taken to the operating room by the general surgery team. SCDs were placed and IV antibiotics were given. The patient's chest was prepped and draped in a sterile fashion. A time out was performed and the implants to be used were identified.  Bilateral mastectomies were performed.  Once the general surgery team had completed their portion of the case the patient was rendered to the plastic and reconstructive surgery team.  Right:  The pectoralis major  muscle was lifted from the chest wall with release of the lateral edge and lateral inframammary fold.  The pocket was irrigated with antibiotic solution and hemostasis was achieved with electrocautery.  The ADM was then prepared according to the manufacture guidelines and slits placed to help with postoperative fluid management.  The ADM was then sutured to the inferior and lateral edge of the inframammary fold with 2-0 PDS starting with an interrupted stitch and then a running stitch.  The lateral portion was sutured to with interrupted sutures after the expander was placed.  The expander was prepared according to the manufacture guidelines, the air evacuated and then it was placed under the ADM and pectoralis major muscle.  The inferior and lateral tabs were used to secure the expander to the chest wall with 2-0 PDS.  The drain was placed at the inframammary fold over the ADM and secured to the skin with 3-0 Silk.  The deep layers were closed with 3-0 Monocryl followed by 4-0 Monocryl.  The skin was closed with 5-0 Monocryl.  Left:  The pectoralis major muscle was lifted from the chest wall with release of the lateral edge and lateral inframammary fold.  The pocket was irrigated with antibiotic solution and hemostasis was achieved with electrocautery.  The ADM was then prepared according to the manufacture guidelines and slits placed to help with postoperative fluid management.  The ADM was then sutured to the inferior and lateral edge of the inframammary fold with 2-0 PDS starting with an interrupted stitch and then a running stitch.  The lateral portion was sutured to with interrupted sutures after the expander was placed.  The expander was prepared  according to the manufacture guidelines, the air evacuated and then it was placed under the ADM and pectoralis major muscle.  The inferior and lateral tabs were used to secure the expander to the chest wall with 2-0 PDS.  The drain was placed at the inframammary  fold over the ADM and secured to the skin with 3-0 Silk.    The deep layers were closed with 3-0 Monocryl followed by 4-0 Monocryl.  The skin was closed with 5-0 Monocryl and then dermabond was applied.  The ABDs and breast binder were placed.  The patient tolerated the procedure well and there were no complications.  The patient was allowed to wake from anesthesia and taken to the recovery room in satisfactory condition.   The advanced practice practitioner (APP) assisted throughout the case.  The APP was essential in retraction and counter traction when needed to make the case progress smoothly.  This retraction and assistance made it possible to see the tissue plans for the procedure.  The assistance was needed for blood control, tissue re-approximation and assisted with closure of the incision site.

## 2020-12-04 NOTE — Interval H&P Note (Signed)
History and Physical Interval Note:no change in H and P  12/04/2020 7:47 AM  Jennifer Davis  has presented today for surgery, with the diagnosis of LEFT BREAST CANCER.  The various methods of treatment have been discussed with the patient and family. After consideration of risks, benefits and other options for treatment, the patient has consented to  Procedure(s): BILATERAL BREAST MASTECTOMY WITH LEFT TARGETED RADIOACTIVE SEED GUIDED LYMPH NODE DISSECTION (Bilateral) INSERTION PORT-A-CATH WITH ULTRASOUND GUIDANCE (N/A) IMMEDIATE BILATERAL BREAST RECONSTRUCTION WITH PLACEMENT OF TISSUE EXPANDER AND FLEX HD (ACELLULAR HYDRATED DERMIS) (Bilateral) as a surgical intervention.  The patient's history has been reviewed, patient examined, no change in status, stable for surgery.  I have reviewed the patient's chart and labs.  Questions were answered to the patient's satisfaction.     Jennifer Davis

## 2020-12-04 NOTE — Op Note (Signed)
Jennifer Davis 12/04/2020   Pre-op Diagnosis: LEFT BREAST CANCER     Post-op Diagnosis: same  Procedure(s): Bilateral mastectomies Targeted deep left axillary lymph node dissection Left subclavian Port-A-Cath insertion  Surgeon: Coralie Keens, MD  Assistant: Carlena Hurl, PA  Anesthesia: General  Staff:  Circulator: Paulette Blanch, RN Radiology Technologist: Vivi Barrack, RT Scrub Person: Izora Ribas, RN  Estimated Blood Loss: Minimal               Specimens: sent to path  Indications: This is a 45 year old female noticed with a left breast cancer.  This is found on screening mammography.  She also had a biopsy of a lymph node which was positive for malignancy.  She decided to start antihormonal therapy and delay her surgery for family reasons.  She now after having several benign things found on bilateral MRI of the breast has decided to proceed with bilateral mastectomies.  Because she is high risk for recurrence of the positive node a Port-A-Cath will be inserted for chemotherapy as well  Procedure:  The patient was brought to the operating room and identifies the correct patient.  She is placed upon the operating table general anesthesia was induced.  Her left chest and neck were prepped and draped in usual sterile fashion.  The patient was placed in the Trendelenburg position.  I anesthetized the skin on the left chest with Marcaine.  I then used the introducer needle to easily cannulate the left subclavian vein.  A wire was then passed through the needle and into the central venous system under fluoroscopy.  The needle was then removed.  I then anesthetized the skin further and made an incision incorporating the needle site with a scalpel.  I then dissected to the subtenons tissue with the cautery and created a pocket for the port.  An 8 French [place] port was then brought to the field.  I passed the dilator and introducer sheath over the wire and into the central  venous system.  I then remove the wire and the dilator.  The catheter was attached to the port and cut an appropriate length.  I placed the port in the pocket and then fed the catheter down the peel-away sheath.  The sheath was peeled away leaving the catheter in the central superior vena cava.  I accessed the port and good flush and return were demonstrated.  I then instilled with concentrated heparin solution.  The port was then sewn in place to the chest wall with 2 separate 3-0 Prolene sutures.  I then closed the subcutaneous tissue with interrupted 3-0 Vicryl sutures and closed the skin with a running 4-0 Monocryl.  Next I turned attention toward the right mastectomy.  I made an elliptical incision from medial to lateral across the right breast incorporating the nipple areolar complex.  I then dissected into the breast tissue with the cautery.  I then dissected the superior skin flaps with the cautery moving toward the clavicle.  I then dissected out the inferior skin flap going to the inframammary ridge.  The breast was then dissected off of the chest wall with electrocautery dissecting medial to lateral.  Once the breast was removed I then marked the lateral margin with a suture and it was sent to pathology for evaluation.  Hemostasis was then achieved with the cautery.  I then turned my attention toward the left breast.  Again I created elliptical incision medial to lateral with a scalpel staying around the  nipple areolar complex.  I again used the cautery to dissect down to the breast tissue with electrocautery.  I again created a superior skin flap staying below the level of the Port-A-Cath going toward the clavicle.  I then took this down to the chest wall.  I next dissected out the inferior skin flap going down with the cautery to the level inframammary ridge.  I then dissected the breast tissue off the pectoralis dissecting medial to lateral with electrocautery going toward the axilla.  I then  completed remove the breast.  I again marked the lateral margin with a suture.  I then used the neoprobe to identify the targeted lymph node containing the radioactive seed in the left axilla.  The patient had several enlarged hard lymph nodes around this area.  I removed the targeted sentinel lymph node with the surrounding firm lymph nodes with the cautery.  An x-ray was performed on specimen confirming that the radioactive seed and previous targeted node marker in the specimen.  I then excised several other enlarged lymph nodes in the vicinity of the cautery as well.  Several bridging veins were clipped with surgical clips.  At this point there were no other large palpable nodes.  I irrigated the axilla with saline.  Hemostasis appeared to be achieved.  At this point Dr. Marla Roe presented into the room to complete her portion of the immediate reconstruction.          Coralie Keens   Date: 12/04/2020  Time: 10:35 AM

## 2020-12-04 NOTE — Anesthesia Procedure Notes (Signed)

## 2020-12-04 NOTE — Discharge Instructions (Signed)
INSTRUCTIONS FOR AFTER SURGERY   You will likely have some questions about what to expect following your operation.  The following information will help you and your family understand what to expect when you are discharged from the hospital.  Following these guidelines will help ensure a smooth recovery and reduce risks of complications.  Postoperative instructions include information on: diet, wound care, medications and physical activity.  AFTER SURGERY Expect to go home after the procedure.  In some cases, you may need to spend one night in the hospital for observation.  DIET This surgery does not require a specific diet.  However, I have to mention that the healthier you eat the better your body can start healing. It is important to increasing your protein intake.  This means limiting the foods with added sugar.  Focus on fruits and vegetables and some meat. It is very important to drink water after your surgery.  If your urine is bright yellow, then it is concentrated, and you need to drink more water.  As a general rule after surgery, you should have 8 ounces of water every hour while awake.  If you find you are persistently nauseated or unable to take in liquids let us know.  NO TOBACCO USE or EXPOSURE.  This will slow your healing process and increase the risk of a wound.  WOUND CARE If you have a drain: Clean with baby wipes for three days.   If you have steri-strips / tape directly attached to your skin leave them in place. It is OK to get these wet.  No baths, pools or hot tubs for two weeks. We close your incision to leave the smallest and best-looking scar. No ointment or creams on your incisions until given the go ahead.  Especially not Neosporin (Too many skin reactions with this one).  A few weeks after surgery you can use Mederma and start massaging the scar. We ask you to wear your binder or sports bra for the first 6 weeks around the clock, including while sleeping. This provides  added comfort and helps reduce the fluid accumulation at the surgery site.  ACTIVITY No heavy lifting until cleared by the doctor.  It is OK to walk and climb stairs. In fact, moving your legs is very important to decrease your risk of a blood clot.  It will also help keep you from getting deconditioned.  Every 1 to 2 hours get up and walk for 5 minutes. This will help with a quicker recovery back to normal.  Let pain be your guide so you don't do too much.  NO, you cannot do the spring cleaning and don't plan on taking care of anyone else.  This is your time for TLC.   WORK Everyone returns to work at different times. As a rough guide, most people take at least 1 - 2 weeks off prior to returning to work. If you need documentation for your job, bring the forms to your postoperative follow up visit.  DRIVING Arrange for someone to bring you home from the hospital.  You may be able to drive a few days after surgery but not while taking any narcotics or valium.  BOWEL MOVEMENTS Constipation can occur after anesthesia and while taking pain medication.  It is important to stay ahead for your comfort.  We recommend taking Milk of Magnesia (2 tablespoons; twice a day) while taking the pain pills.  SEROMA This is fluid your body tried to put in the surgical site.    This is normal but if it creates excessive pain and swelling let us know.  It usually decreases in a few weeks.  MEDICATIONS and PAIN CONTROL At your preoperative visit for you history and physical you were given the following medications: 1. An antibiotic: Start this medication when you get home and take according to the instructions on the bottle. 2. Zofran 4 mg:  This is to treat nausea and vomiting.  You can take this every 6 hours as needed and only if needed. 3. Norco (hydrocodone/acetaminophen) 5/325 mg:  This is only to be used after you have taken the motrin or the tylenol. Every 8 hours as needed. Over the counter Medication to  take: 4. Ibuprofen (Motrin) 600 mg:  Take this every 6 hours.  If you have additional pain then take 500 mg of the tylenol.  Only take the Norco after you have tried these two. 5. Miralax or stool softener of choice: Take this according to the bottle if you take the Norco.  WHEN TO CALL Call your surgeon's office if any of the following occur: . Fever 101 degrees F or greater . Excessive bleeding or fluid from the incision site. . Pain that increases over time without aid from the medications . Redness, warmth, or pus draining from incision sites . Persistent nausea or inability to take in liquids . Severe misshapen area that underwent the operation.  [facility]  What is the benefit of having a drain?  During surgery your tissue layers are separated.  This raw surface stimulates your body to fill the space with serous fluid.  This is normal but you don't want that fluid to collect and prevent healing.  A fluid collection can also become infected.  The Jackson-Pratt (JP) drain is used to eliminate this collection of fluid and allow the tissue to heal together.    Jackson-Pratt (JP) bulb    How to care for your drainage and suction unit at home Your drainage catheter will be connected to a collection device. The vacuum caused when the device is compressed allows drainage to collect in the device.    . Wash your hands with soap and water before and after touching the system. . Empty the JP drain every 12 hours once you get home from your procedure. . Record the fluid amount on the record sheet included. . Start with stripping the drain tube to push the clots or excess fluid to the bulb.  Do this by pinching the tube with one hand near your skin.  Then with the other hand squeeze the tubing and work it toward the bulb.  This should be done several times a day.  This may collapse the tube which will correct on its own.   . Use a safety pin to attach your collection device  to your clothing so there is no tension on the insertion site.   . If you have drainage at the skin insertion site, you can apply a gauze dressing and secure it with tape. . If the drain falls out, apply a gauze dressing over the drain insertion site and secure with tape.   To empty the collection device:   . Release the stopper on the top of the collection unit (bulb).  . Pour contents into a measuring container such as a plastic medicine cup.  . Record the day and amount of drainage on the attached sheet. . This should be done at least twice a day.      To compress the Jackson-Pratt Bulb:  . Release the stopper at the top of the bulb. . Squeeze the bulb tightly in your fist, squeezing air out of the bulb.  . Replace the stopper while the bulb is compressed.  . Be careful not to spill the contents when squeezing the bulb. . The drainage will start bright red and turn to pink and then yellow with time. . IMPORTANT: If the bulb is not squeezed before adding the stopper it will not draw out the fluid.  Care for the JP drain site and your skin daily:  . You may shower three days after surgery. . Secure the drain to a ribbon or cloth around your waist while showering so it does not pull out while showering. . Be sure your hands are cleaned with soap and water. . Use a clean wet cotton swab to clean the skin around the drain site.  . Use another cotton swab to place Vaseline or antibiotic ointment on the skin around the drain.     Contact your physician if any of the following occur:  . The fluid in the bulb becomes cloudy. . Your temperature is greater than 101.4.  . The incision opens. . If you have drainage at the skin insertion site, you can apply a gauze dressing and secure it with tape. . If the drain falls out, apply a gauze dressing over the drain insertion site and secure with tape.  . You will usually have more drainage when you are active than while you rest or are asleep. If the  drainage increases significantly or is bloody call the physician                             Bring this record with you to each office visit Date  Drainage Volume  Date   Drainage volume                                                                                                                                                                                          

## 2020-12-04 NOTE — Transfer of Care (Signed)
Immediate Anesthesia Transfer of Care Note  Patient: Jennifer Davis  Procedure(s) Performed: BILATERAL MASTECTOMIES, RADIOACTIVE SEED GUIDED TARGETED LEFT AXILLARY NODE DISSECTION (Left Breast) INSERTION PORT-A-CATH LEFT SUBCLAVIAN (Left Chest) IMMEDIATE BILATERAL BREAST RECONSTRUCTION WITH PLACEMENT OF TISSUE EXPANDER AND FLEX HD (ACELLULAR HYDRATED DERMIS) (Bilateral )  Patient Location: PACU  Anesthesia Type:GA combined with regional for post-op pain  Level of Consciousness: sedated  Airway & Oxygen Therapy: Patient Spontanous Breathing and Patient connected to face mask oxygen  Post-op Assessment: Report given to RN and Post -op Vital signs reviewed and stable  Post vital signs: Reviewed and stable  Last Vitals:  Vitals Value Taken Time  BP 131/82 12/04/20 1201  Temp    Pulse 92 12/04/20 1202  Resp 18 12/04/20 1202  SpO2 100 % 12/04/20 1202  Vitals shown include unvalidated device data.  Last Pain:  Vitals:   12/04/20 0725  TempSrc: Oral  PainSc: 0-No pain      Patients Stated Pain Goal: 5 (62/22/97 [id number])  Complications: No complications documented.

## 2020-12-05 ENCOUNTER — Encounter: Payer: Self-pay | Admitting: Physical Therapy

## 2020-12-05 ENCOUNTER — Encounter (HOSPITAL_BASED_OUTPATIENT_CLINIC_OR_DEPARTMENT_OTHER): Payer: Self-pay | Admitting: Surgery

## 2020-12-05 DIAGNOSIS — D0512 Intraductal carcinoma in situ of left breast: Secondary | ICD-10-CM | POA: Diagnosis not present

## 2020-12-05 NOTE — Discharge Summary (Signed)
Physician Discharge Summary  Patient ID: Jennifer Davis MRN: [id number] DOB/AGE: 44-24-1977 46 y.o.  Admit date: 12/04/2020 Discharge date: 12/05/2020  Admission Diagnoses: Breast Cancer  Discharge Diagnoses:  Active Problems:   Breast cancer [facility])   Discharged Condition: good  Hospital Course: Patient is resting in bed this morning, easily aroused. No overnight events. Patient reports she is feeling well. Was able to sleep most of the night. Denies fever, chills, nausea, and vomiting. Reports pain is well controlled.   Consults: None  Significant Diagnostic Studies: surgical specimen sent to pathology  Treatments: surgery: bilateral mastectomies with placement of flex HD and tissue expanders.  Discharge Exam: Blood pressure (!) 164/91, pulse 92, temperature 98.3 F (36.8 C), resp. rate 18, height 4' 11.5" (1.511 m), weight 98.8 kg, last menstrual period 11/24/2020, SpO2 96 %. General appearance: alert, cooperative and no distress Head: Normocephalic, without obvious abnormality, atraumatic Eyes: EOMs intact Resp: nonlabored Breasts: normal appearance, no masses or tenderness, Bilateral incision intact, clean and dry. Steristrips and Honeycomb dressing in place. Bilateral drains in place with serosanguinous fluid in bulbs. No signs of infection, erythema, seroma.hematoma.   Disposition: Discharge disposition: 01-Home or Self Care       Discharge Instructions    Call MD for:  difficulty breathing, headache or visual disturbances   Complete by: As directed    Call MD for:  extreme fatigue   Complete by: As directed    Call MD for:  hives   Complete by: As directed    Call MD for:  persistant dizziness or light-headedness   Complete by: As directed    Call MD for:  persistant nausea and vomiting   Complete by: As directed    Call MD for:  redness, tenderness, or signs of infection (pain, swelling, redness, odor or green/yellow discharge around incision site)   Complete by:  As directed    Call MD for:  severe uncontrolled pain   Complete by: As directed    Call MD for:  temperature >100.4   Complete by: As directed    Diet - low sodium heart healthy   Complete by: As directed    Increase activity slowly   Complete by: As directed      Allergies as of 12/05/2020   No Known Allergies     Medication List    TAKE these medications   acetaminophen 500 MG tablet Commonly known as: TYLENOL Take 1 tablet (500 mg total) by mouth every 6 (six) hours as needed. For use AFTER surgery   busPIRone 30 MG tablet Commonly known as: BUSPAR Take 1 tablet (30 mg total) by mouth 2 (two) times daily.   cetirizine 10 MG tablet Commonly known as: ZYRTEC Take 10 mg by mouth daily.   DULoxetine 60 MG capsule Commonly known as: CYMBALTA Take 2 capsules (120 mg total) by mouth daily.   EPINEPHrine 0.3 mg/0.3 mL Soaj injection Commonly known as: EpiPen 2-Pak Inject 0.3 mLs (0.3 mg total) into the muscle once as needed for up to 1 dose (for severe allergic reaction). CAll 911 immediately if you have to use this medicine   gabapentin 100 MG capsule Commonly known as: NEURONTIN Take 1 capsule (100 mg total) by mouth 3 (three) times daily as needed.   ibuprofen 600 MG tablet Commonly known as: ADVIL Take 1 tablet (600 mg total) by mouth every 6 (six) hours as needed for mild pain or moderate pain. For use AFTER surgery   melatonin 5 MG Tabs Take 5  mg by mouth at bedtime as needed.   montelukast 10 MG tablet Commonly known as: SINGULAIR Take 1 tablet by mouth daily.   MULTIVITAMIN PO Take 2 tablets by mouth daily. Skin/hair/nail supplement.   ondansetron 4 MG disintegrating tablet Commonly known as: ZOFRAN-ODT Take 1 tablet (4 mg total) by mouth every 8 (eight) hours as needed for nausea or vomiting.   ProAir HFA 108 (90 Base) MCG/ACT inhaler Generic drug: albuterol Inhale 2 puffs into the lungs as needed.   Symbicort 160-4.5 MCG/ACT inhaler Generic drug:  budesonide-formoterol Inhale 2 puffs into the lungs 2 (two) times daily.   tamoxifen 10 MG tablet Commonly known as: NOLVADEX Take 1 tablet (10 mg total) by mouth daily.       Follow-up Information    Dillingham, Loel Lofty, DO In 10 days.   Specialty: Plastic Surgery Contact information: [address] [place] [address] [phone]               Signed: Threasa Heads 12/05/2020, 7:16 AM

## 2020-12-06 LAB — SURGICAL PATHOLOGY

## 2020-12-09 ENCOUNTER — Ambulatory Visit: Payer: 59 | Admitting: Physical Therapy

## 2020-12-09 NOTE — Assessment & Plan Note (Deleted)
09/20/2019: Screening mammogram on 08/28/20 showed the stable bilateral masses, a new 0.9cm mass at the 1 o'clock position in the left breast, and one abnormal left axillary lymph node with 0.6cm cortical thickening. 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  Margins negative ER 90%, PR 60%, HER-2 negative, Ki-67 25% 2.Adjuvant chemotherapy with dose dense Adriamycin and Cytoxan followed by Taxol 3.Adjuvant radiation 4.Followed by adjuvant antiestrogen therapy [facility] 16070 nausea clinical trial ------------------------------------------------------------------------------------------------------------- Once she recovers from the surgery, we will initiate chemotherapy in 3 weeks. Return to clinic in first day of chemotherapy.

## 2020-12-10 ENCOUNTER — Encounter: Payer: Self-pay | Admitting: *Deleted

## 2020-12-10 ENCOUNTER — Telehealth: Payer: Self-pay | Admitting: Hematology and Oncology

## 2020-12-10 ENCOUNTER — Inpatient Hospital Stay: Payer: 59 | Admitting: Hematology and Oncology

## 2020-12-10 DIAGNOSIS — Z170 Estrogen receptor positive status [ER+]: Secondary | ICD-10-CM

## 2020-12-10 NOTE — Telephone Encounter (Signed)
Scheduled appt per 2/1 sch msg - pt is aware of appt date and time   

## 2020-12-11 ENCOUNTER — Ambulatory Visit: Payer: 59 | Admitting: Hematology and Oncology

## 2020-12-12 DIAGNOSIS — Z9013 Acquired absence of bilateral breasts and nipples: Secondary | ICD-10-CM | POA: Insufficient documentation

## 2020-12-12 NOTE — Progress Notes (Signed)
Subjective:     Patient ID: Jennifer Davis, female    DOB: 07-04-1976, 45 y.o.   MRN: [id number]  Chief Complaint  Patient presents with  . Post-op Follow-up    HPI: The patient is a 45 y.o. female here for follow-up after undergoing bilateral breast mastectomies with left lymph node dissection with Dr. [name] and bilateral breast placement of tissue expanders and Flex HD with Dr. Marla Roe on 12/04/20.  ~ 1 week PO Patient reports she is doing very well.  Denies fever/chills, nausea/vomiting, pain.  Drain output has been greater than 50 cc for last several days.  Reports she had hives from her last dose of antibiotics after surgery.  Still has some lingering itching from that.  Review of Systems  Constitutional: Negative for chills and fever.  Respiratory: Negative for shortness of breath.   Cardiovascular: Negative for chest pain.  Gastrointestinal: Negative for constipation, diarrhea, nausea and vomiting.  Skin: Negative for color change and pallor.     Objective:   Vital Signs BP 130/81 (BP Location: Left Arm, Patient Position: Sitting, Cuff Size: Large)   Pulse 98   LMP 11/24/2020   SpO2 98%  Vital Signs and Nursing Note Reviewed  Physical Exam Constitutional:      General: She is not in acute distress.    Appearance: Normal appearance. She is not ill-appearing.  HENT:     Head: Normocephalic and atraumatic.  Eyes:     Extraocular Movements: Extraocular movements intact and EOM normal.  Pulmonary:     Effort: Pulmonary effort is normal.  Chest:     Comments: Bilateral breast incisions are intact, clean and dry.  Honeycomb dressings and Steri-Strips in place.  No signs of infection, redness, drainage, seroma/hematoma.  She has a small amount of bruising present.  Port incision has irritation erythema present.  Steri-Strip removed.  Incision is intact, clean and dry.  She is a few scattered scabs on her upper chest near the neckline from her hives. Musculoskeletal:         General: Normal range of motion.     Cervical back: Normal range of motion.  Skin:    General: Skin is warm and dry.     Coloration: Skin is not jaundiced or pale.  Neurological:     Mental Status: She is alert and oriented to person, place, and time.     Gait: Gait is intact.  Psychiatric:        Mood and Affect: Mood and affect normal.        Behavior: Behavior normal.        Thought Content: Thought content normal.        Cognition and Memory: Memory normal.        Judgment: Judgment normal.       Assessment/Plan:     ICD-10-CM   1. S/P mastectomy, bilateral  Z90.13    [name] is doing very well today.  Bilateral incisions are intact clean and dry.  No signs of infection or drainage present.  Drain output is too high today for removal.  Removed Steri-Strip from port site due to irritation erythema.  Recommend applying Vaseline to the area.  She may apply hydrocortisone cream to the areas of previous hives if still itching; take care to avoid the incisions.  She may also do Benadryl at night and daytime allergy medication if desired until itching resolves.  She is interested in getting a fill today and feels well.  Follow-up currently scheduled  for next week.  Return precautions provided.  Call office with any questions/concerns.  We placed injectable saline in the Expander using a sterile technique: Right: 50 cc for a total of 300 / 535 cc Left: 50 cc for a total of 300 / 535 cc    Threasa Heads, PA-C 12/13/2020, 12:38 PM

## 2020-12-13 ENCOUNTER — Other Ambulatory Visit: Payer: Self-pay

## 2020-12-13 ENCOUNTER — Ambulatory Visit (INDEPENDENT_AMBULATORY_CARE_PROVIDER_SITE_OTHER): Payer: 59 | Admitting: Plastic Surgery

## 2020-12-13 ENCOUNTER — Encounter: Payer: Self-pay | Admitting: Plastic Surgery

## 2020-12-13 VITALS — BP 130/81 | HR 98

## 2020-12-13 DIAGNOSIS — Z9013 Acquired absence of bilateral breasts and nipples: Secondary | ICD-10-CM

## 2020-12-16 NOTE — Progress Notes (Signed)
Subjective:     Patient ID: Jennifer Davis, female    DOB: Mar 11, 1976, 45 y.o.   MRN: [id number]  Chief Complaint  Patient presents with  . Post-op Follow-up    HPI: The patient is a 45 y.o. female here for follow-up after undergoing bilateral breast mastectomies with left lymph node dissection with Dr. [name] and bilateral breasts placement of tissue expanders and Flex HD with Dr. Marla Roe on 12/04/2020. Had hives after last Abx in PACU. No issues since.   ~ 2 weeks PO Patient reports she is doing very well.  Reports a little bit of tightness with her last fill but no significant pain.  She denies fever/chills, nausea/vomiting, pain.  Reports some expected numbness of bilateral breasts.  Review of Systems  Constitutional: Negative for chills and fever.  Respiratory: Negative for shortness of breath.   Cardiovascular: Negative for chest pain.  Gastrointestinal: Negative for constipation, diarrhea, nausea and vomiting.  Skin: Negative for color change, pallor, rash and wound.     Objective:   Vital Signs BP (!) 141/91 (BP Location: Left Arm, Patient Position: Sitting, Cuff Size: Large)   Pulse 95   LMP 12/19/2020 (Exact Date)   SpO2 99%  Vital Signs and Nursing Note Reviewed  Physical Exam Constitutional:      General: She is not in acute distress.    Appearance: Normal appearance. She is not ill-appearing.  HENT:     Head: Normocephalic and atraumatic.  Eyes:     Extraocular Movements: Extraocular movements intact and EOM normal.  Cardiovascular:     Rate and Rhythm: Normal rate.  Pulmonary:     Effort: Pulmonary effort is normal.  Chest:     Comments: Honeycomb dressings removed.  Steri-Strips in place.  Bilateral incisions are intact, clean and dry.  No signs of infection, erythema, drainage.  Bilateral drains are in place.  Less than 25 cc of output for the last several days bilaterally. Musculoskeletal:        General: Normal range of motion.     Cervical back:  Normal range of motion.  Skin:    General: Skin is warm and dry.     Coloration: Skin is not pale.     Findings: No erythema or rash.  Neurological:     Mental Status: She is alert and oriented to person, place, and time.     Gait: Gait is intact.  Psychiatric:        Mood and Affect: Mood and affect normal.        Behavior: Behavior normal.        Thought Content: Thought content normal.        Cognition and Memory: Memory normal.        Judgment: Judgment normal.       Assessment/Plan:     ICD-10-CM   1. S/P mastectomy, bilateral  Z90.13    Ms. Garfield is doing very well.  Honeycomb dressings removed.  Steri-Strips are in place.  Incisions are intact, clean and dry.  No signs of infection, redness, drainage.  Bilateral drains were removed today.  Cover drain sites with Vaseline and gauze for the next few days until closed.  Patient reports she did very well with her last fill, mild tightness.  She would like an additional fill today.  We placed injectable saline in the Expander using a sterile technique: Right: 50 cc for a total of 350 / 535 cc Left: 50 cc for a total of 350 / 535  cc  Follow-up in 2 weeks for additional fill.  Return precautions provided.  Call office with any questions/concerns.  Threasa Heads, PA-C 12/19/2020, 2:12 PM

## 2020-12-17 NOTE — Progress Notes (Signed)
HEMATOLOGY-ONCOLOGY MYCHART VIDEO VISIT PROGRESS NOTE  I connected with Jennifer Davis on 12/18/2020 at  1:30 PM EST by MyChart video conference and verified that I am speaking with the correct person using two identifiers.  I discussed the limitations, risks, security and privacy concerns of performing an evaluation and management service by MyChart and the availability of in person appointments.  I also discussed with the patient that there may be a patient responsible charge related to this service. The patient expressed understanding and agreed to proceed.  Patient's Location: Home Physician Location: Clinic  CHIEF COMPLIANT: Follow-up of left breast cancer s/p bilateral mastectomies  INTERVAL HISTORY: Jennifer Davis is a 45 y.o. female with above-mentioned history of left breast cancer who underwent bilateral mastectomies with reconstruction on 12/04/20 with Dr. 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 She presents to the clinic today for follow-up  Oncology History  Malignant neoplasm of upper-outer quadrant of left breast in female, estrogen receptor positive ([place])  09/19/2020 Initial Diagnosis   Mammogram in 07/2018 showed probably benign bilateral breast masses that were stable on her last mammogram on 07/26/19. Mammogram on 08/28/20 showed the stable bilateral masses, a new 0.9cm mass at the 1 o'clock position in the left breast, and one abnormal left axillary lymph node with 0.6cm cortical thickening. 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    10/02/2020 Genetic Testing   Negative genetic testing: no pathogenic variants detected in [facility]-Hereditary Cancers Panel.  Variants of uncertain significance  detected in APC c.6918T>A (p.Asp2306Glu) and POLE  c.2612G>C (p.Ser871Thr).  The report date is November 24. 2021.   The [facility] Hereditary Cancers Panel offered by [facility] includes sequencing and/or deletion duplication testing of the following 48 genes: APC, ATM, AXIN2, BARD1, BMPR1A, BRCA1, BRCA2, BRIP1, CDH1, CDK4, CDKN2A (p14ARF), CDKN2A (p16INK4a), CHEK2, CTNNA1, DICER1, EPCAM (Deletion/duplication testing only), GREM1 (promoter region deletion/duplication testing only), KIT, MEN1, MLH1, MSH2, MSH3, MSH6, MUTYH, NBN, NF1, NHTL1, PALB2, PDGFRA, PMS2, POLD1, POLE, PTEN, RAD50, RAD51C, RAD51D, RNF43, SDHB, SDHC, SDHD, SMAD4, SMARCA4. STK11, TP53, TSC1, TSC2, and VHL.  The following genes were evaluated for sequence changes only: SDHA and HOXB13 c.251G>A variant only.   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    12/04/2020 Cancer Staging   Staging form: Breast, AJCC 8th Edition - Pathologic stage from 12/04/2020: Stage IA (pT1c, pN1a, cM0, G2, ER+, PR+, HER2-) - Signed by [name], NP on 12/18/2020 Histologic grading system: 3 grade system     Observations/Objective:  There were no vitals filed for this visit. There is no height or weight on file to calculate BMI.  I have reviewed the data as listed CMP Latest Ref Rng & Units 09/25/2020  Glucose 70 - 99 mg/dL 143(H)  BUN 6 - 20 mg/dL 8  Creatinine 0.44 - 1.00 mg/dL 0.82  Sodium 135 - 145 mmol/L 139  Potassium 3.5 - 5.1 mmol/L 3.7  Chloride 98 - 111 mmol/L 106  CO2 22 - 32 mmol/L 22  Calcium 8.9 - 10.3 mg/dL 8.7(L)  Total Protein 6.5 - 8.1 g/dL 7.0  Total Bilirubin 0.3 - 1.2 mg/dL 0.3  Alkaline Phos 38 - 126 U/L 79  AST 15 - 41 U/L 13(L)  ALT 0 - 44 U/L 13    Lab Results  Component Value Date   WBC 9.9 09/25/2020   HGB  11.7 (L) 09/25/2020   HCT 36.3 09/25/2020   MCV 86.4 09/25/2020   PLT 426 (H) 09/25/2020   NEUTROABS 6.1 09/25/2020      Assessment Plan:  Malignant neoplasm of upper-outer quadrant of left breast in female, estrogen receptor positive ([place]) 09/20/2019: Screening mammogram on 08/28/20 showed the stable bilateral masses, a new 0.9cm mass at the 1 o'clock position in the left breast, and one abnormal left axillary lymph node with 0.6cm cortical thickening. 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 MammaPrint: High risk: Luminal type B, 5-year metastasis free survival with chemo and hormone therapy: 93%, absolute benefit from chemo greater than 12%, average 10-year risk of recurrence untreated: 29%  Treatment plan: 1.Mastectomywith targeted node dissection 2.adjuvant chemotherapy with dose dense Adriamycin and Cytoxan followed by Taxol 3.Adjuvant radiation 4.Followed by adjuvant antiestrogen therapy ------------------------------------------------------------------------------------------------------------- Bilateral mastectomies with reconstruction [name]):  Right breast: no malignancy  Left breast: invasive and in situ ductal carcinoma, 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  Pathology counseling: I discussed the final pathology report of the patient provided  a copy of this report. I discussed the margins as well as lymph node surgeries. We also discussed the final staging along with previously performed ER/PR and HER-2/neu testing.  Treatment Plan: 1. adjuvant chemotherapy with dose dense Adriamycin and Cytoxan followed by Taxol 2.Adjuvant radiation 3.Followed by adjuvant antiestrogen therapy  [facility] nausea study Start chemo in 2 weeks    I discussed the assessment and treatment plan with the patient. The patient was  provided an opportunity to ask questions and all were answered. The patient agreed with the plan and demonstrated an understanding of the instructions. The patient was advised to call back or seek an in-person evaluation if the symptoms worsen or if the condition fails to improve as anticipated.   Total time spent: 30 minutes including face-to-face MyChart video visit time and time spent for planning, charting and coordination of care  Rulon Eisenmenger, MD 12/18/2020   I, Cloyde Reams Dorshimer, am acting as scribe for Nicholas Lose, MD.  I have reviewed the above documentation for accuracy and completeness, and I agree with the above.

## 2020-12-18 ENCOUNTER — Inpatient Hospital Stay (HOSPITAL_BASED_OUTPATIENT_CLINIC_OR_DEPARTMENT_OTHER): Payer: 59 | Admitting: Hematology and Oncology

## 2020-12-18 ENCOUNTER — Other Ambulatory Visit: Payer: Self-pay | Admitting: Hematology and Oncology

## 2020-12-18 ENCOUNTER — Encounter: Payer: Self-pay | Admitting: *Deleted

## 2020-12-18 DIAGNOSIS — Z170 Estrogen receptor positive status [ER+]: Secondary | ICD-10-CM

## 2020-12-18 DIAGNOSIS — C50412 Malignant neoplasm of upper-outer quadrant of left female breast: Secondary | ICD-10-CM

## 2020-12-18 MED ORDER — DEXAMETHASONE 4 MG PO TABS: 4.0000 mg | ORAL_TABLET | Freq: Every day | ORAL | 0 refills | Status: DC

## 2020-12-18 MED ORDER — PROCHLORPERAZINE MALEATE 10 MG PO TABS: 10.0000 mg | ORAL_TABLET | Freq: Four times a day (QID) | ORAL | 1 refills | Status: DC | PRN

## 2020-12-18 MED ORDER — LORAZEPAM 0.5 MG PO TABS: 0.5000 mg | ORAL_TABLET | Freq: Every evening | ORAL | 0 refills | Status: DC | PRN

## 2020-12-18 MED ORDER — ONDANSETRON HCL 8 MG PO TABS: 8.0000 mg | ORAL_TABLET | Freq: Two times a day (BID) | ORAL | 1 refills | Status: DC | PRN

## 2020-12-18 MED ORDER — LIDOCAINE-PRILOCAINE 2.5-2.5 % EX CREA: TOPICAL_CREAM | CUTANEOUS | 3 refills | Status: DC

## 2020-12-18 MED FILL — PROCHLORPERAZINE 10 MG TAB: 10 | 7 days supply | Qty: 30 | Fill #0

## 2020-12-18 MED FILL — LORazepam 0.5 MG TABS: 0.5 | 30 days supply | Qty: 30 | Fill #0

## 2020-12-18 MED FILL — ONDANSETRON HCL 8 MG TABLET: 8 | 15 days supply | Qty: 30 | Fill #0

## 2020-12-18 MED FILL — LIDOCAINE-PRILOCAINE CREAM: 2.5-2.5 | 15 days supply | Qty: 30 | Fill #0

## 2020-12-18 MED FILL — DEXAMETHASONE 4 MG TABLET: 4 | 8 days supply | Qty: 8 | Fill #0

## 2020-12-18 NOTE — Assessment & Plan Note (Signed)
09/20/2019: Screening mammogram on 08/28/20 showed the stable bilateral masses, a new 0.9cm mass at the 1 o'clock position in the left breast, and one abnormal left axillary lymph node with 0.6cm cortical thickening. 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 MammaPrint: High risk: Luminal type B, 5-year metastasis free survival with chemo and hormone therapy: 93%, absolute benefit from chemo greater than 12%, average 10-year risk of recurrence untreated: 29%  Treatment plan: 1.Mastectomywith targeted node dissection 2.adjuvant chemotherapy with dose dense Adriamycin and Cytoxan followed by Taxol 3.Adjuvant radiation 4.Followed by adjuvant antiestrogen therapy ------------------------------------------------------------------------------------------------------------- Bilateral mastectomies with reconstruction [name]):  Right breast: no malignancy  Left breast: invasive and in situ ductal carcinoma, 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  Pathology counseling: I discussed the final pathology report of the patient provided  a copy of this report. I discussed the margins as well as lymph node surgeries. We also discussed the final staging along with previously performed ER/PR and HER-2/neu testing.  Treatment Plan: 1. adjuvant chemotherapy with dose dense Adriamycin and Cytoxan followed by Taxol 2.Adjuvant radiation 3.Followed by adjuvant antiestrogen therapy  Start chemo in 4 weeks

## 2020-12-18 NOTE — Progress Notes (Signed)
START ON PATHWAY REGIMEN - Breast     Cycles 1 through 4: A cycle is every 14 days:     Doxorubicin      Cyclophosphamide      Pegfilgrastim-xxxx    Cycles 5 through 16: A cycle is every 7 days:     Paclitaxel   **Always confirm dose/schedule in your pharmacy ordering system**  Patient Characteristics: Postoperative without Neoadjuvant Therapy (Pathologic Staging), Invasive Disease, Adjuvant Therapy, HER2 Negative/Unknown/Equivocal, ER Positive, Node Positive, Node Positive (1-3), MammaPrint(R) Ordered, High Genomic Risk Therapeutic Status: Postoperative without Neoadjuvant Therapy (Pathologic Staging) AJCC Grade: G2 AJCC N Category: pN1 AJCC M Category: cM0 ER Status: Positive (+) AJCC 8 Stage Grouping: IA HER2 Status: Negative (-) Oncotype Dx Recurrence Score: Ordered Other Genomic Test AJCC T Category: pT1c PR Status: Positive (+) Adjuvant Therapy Status: No Adjuvant Therapy Received Yet or Changing Initial Adjuvant Regimen due to Tolerance Has this patient completed genomic testing<= Yes - MammaPrint(R) MammaPrint(R) Score: High Genomic Risk Intent of Therapy: Curative Intent, Discussed with Patient

## 2020-12-19 ENCOUNTER — Encounter: Payer: Self-pay | Admitting: Plastic Surgery

## 2020-12-19 ENCOUNTER — Telehealth: Payer: Self-pay

## 2020-12-19 ENCOUNTER — Encounter: Payer: Self-pay | Admitting: Emergency Medicine

## 2020-12-19 ENCOUNTER — Telehealth: Payer: Self-pay | Admitting: Hematology and Oncology

## 2020-12-19 ENCOUNTER — Encounter: Payer: Self-pay | Admitting: *Deleted

## 2020-12-19 ENCOUNTER — Other Ambulatory Visit: Payer: Self-pay

## 2020-12-19 ENCOUNTER — Ambulatory Visit (INDEPENDENT_AMBULATORY_CARE_PROVIDER_SITE_OTHER): Payer: 59 | Admitting: Plastic Surgery

## 2020-12-19 VITALS — BP 141/91 | HR 95

## 2020-12-19 DIAGNOSIS — C50412 Malignant neoplasm of upper-outer quadrant of left female breast: Secondary | ICD-10-CM

## 2020-12-19 DIAGNOSIS — Z9013 Acquired absence of bilateral breasts and nipples: Secondary | ICD-10-CM

## 2020-12-19 DIAGNOSIS — Z170 Estrogen receptor positive status [ER+]: Secondary | ICD-10-CM

## 2020-12-19 NOTE — Telephone Encounter (Signed)
Left message with follow-up appointment per 2/9 schedule message. Gave option to call back to reschedule if needed.

## 2020-12-19 NOTE — Research (Signed)
TESTING THE ADDITION OF TWO APPROVED DRUGS FOR PERSISTENT NAUSEA [facility] [id number])  12/19/20  Eligibility Review: This research coordinator has reviewed this patient's inclusion and exclusion criteria and has found this patient eligible to participate in this research study.      Clabe Seal Clinical Research Coordinator I  12/19/20 2:50 PM

## 2020-12-19 NOTE — Telephone Encounter (Signed)
Trial:  TESTING THE ADDITION OF TWO APPROVED DRUGS FOR PERSISTENT NAUSEA [facility] [address])  12/19/2020 2:05PM  OUTGOING CALL: Outgoing call to [name] as a followup to our prior discussion of potential voluntary participation in the [place] study. Voicemail message left stating that I will plan to touch base with her following chemo education, currently scheduled for Monday, 12/23/20. Direct contact information is provided and I encouraged her to call me with any questions or if this appointment should be cancelled/rescheduled.    The current plan is to meet with Ardelle Park on Monday and potentially consent for participation in the [place] study if she so desires.    Dionne Bucy. Sharlett Iles, BSN, RN, CIC 12/19/2020 4:03 PM

## 2020-12-20 ENCOUNTER — Encounter: Payer: 59 | Admitting: Plastic Surgery

## 2020-12-23 ENCOUNTER — Inpatient Hospital Stay: Payer: 59

## 2020-12-23 ENCOUNTER — Other Ambulatory Visit: Payer: Self-pay

## 2020-12-23 ENCOUNTER — Inpatient Hospital Stay: Payer: 59 | Attending: Hematology and Oncology

## 2020-12-23 DIAGNOSIS — C50412 Malignant neoplasm of upper-outer quadrant of left female breast: Secondary | ICD-10-CM

## 2020-12-23 DIAGNOSIS — Z170 Estrogen receptor positive status [ER+]: Secondary | ICD-10-CM

## 2020-12-23 NOTE — Research (Addendum)
Trial:  TESTING THE ADDITION OF TWO APPROVED DRUGS FOR PERSISTENT NAUSEA ([place] [address])  DCP-001 USE OF A CLINICAL TRIAL SCREENING TOOL TO ADDRESS CANCER HEALTH DISPARITIES IN THE [employer] COMMUNITY ONCOLOGY RESEARCH PROGRAM (NCORP)  12/23/2020 0900AM  Patient Jennifer Davis was identified by Dr. Lindi Adie as a potential candidate for the above listed study.  This Clinical Research Nurse met with Jennifer Davis, [id number] on 12/23/20 in a manner and location that ensures patient privacy to discuss participation in the above listed research study.  Patient is Unaccompanied.  Patient was previously provided with informed consent documents.  Patient has not yet read the informed consent documents and so documents were reviewed page by page today.  As outlined in the informed consent form, this Nurse and Jennifer Davis discussed the purpose of the research study, the investigational nature of the study, study procedures and requirements for study participation, potential risks and benefits of study participation, as well as alternatives to participation.  The patient understands participation is voluntary and they may withdraw from study participation at any time.  Each study arm was reviewed, and randomization discussed.  Potential side effects were reviewed with patient as outlined in the consent form, and patient made aware there may be side effects not yet known. The chance of receiving placebo was discussed. Patient understands enrollment is based on full eligibility.  Confidentiality and how the patient's information will be used as part of study participation were discussed.  Patient was informed there is not reimbursement provided for their time and effort spent on trial participation.  The patient is encouraged to discuss research study participation with their insurance provider to determine what costs they may incur as part of study participation, including research related injury.    Jennifer Davis agrees to receive study  questionnaires electronically using the current email address on file ([email or web address]), agrees to have messages left on her voice mail, and consents to banking of any remaining blood from the mandatory blood specimen.  All questions were answered to patient's satisfaction.  The informed consent and separate HIPAA Authorization was reviewed page by page.  The patient's mental and emotional status is appropriate to provide informed consent, and the patient verbalizes an understanding of study participation.  Patient has agreed to participate in the above listed research study and has voluntarily signed the informed consent version date 05/26/19 ([place] active date 03/05/20) and separate HIPAA Authorization, version date 01/25/17 on 12/23/20 at 0940 AM.  The patient was provided with a copy of the signed informed consent form and separate HIPAA Authorization for their reference.  No study specific procedures were obtained prior to the signing of the informed consent document.  Approximately 40 minutes were spent with the patient reviewing the informed consent documents.  Patient was not requested to complete a Release of Information form.  This Nurse has reviewed this patient's inclusion and exclusion criteria and confirmed Jennifer Davis is eligible for study participation. In addition, clinical research coordinator Jennifer Davis has reviewed this patient's inclusion and exclusion criteria as a second review and confirms Jennifer Davis is eligible for study participation.  Patient may continue with enrollment.  DCP-001 CONSENT: Following consent for [facility], Jennifer Davis and I discussed voluntary participation in the DCP-001 study. We reviewed the  DCP-001 consent form (protocol version date 05/02/19, [place] Active Date 11/14/19) in full, including the voluntary nature of participation, the project purpose, risks and benefits, and who will see the provided information. Patient verbalizes understanding that this study  is a  one-time consent for collection of demographic variables and that no patient identifiers are being reported. We also reviewed the [place] form (dated 12/02/20) including the data to be collected, why the information is being collected, who will see the information, and safety measures to keep information private. All questions were answered. Upon completion of review, the patient verbalizes a desire to voluntarily participate in DCP-001. All consent and authorization forms were signed and dated by the patient and signed copies were provided.   DCP-001 WORKSHEET: After informed consent was obtained, the DCP-001 Worksheet was completed to collect ethnicity, language, literacy, education, employment, income and smoking history not available in the electronic medical record.     Jennifer Davis was thanked for her time and willingness to participate in these two studies. A business card with my direct contact information was provided should any questions or concerns arise before she begins chemotherapy on 01/01/21. Jennifer Davis was escorted to the main entrance and thanked for her time and consideration of this study. Current plan is to complete [facility] [address] enrollment and collect a urine pregnancy test and the mandatory blood specimen prior to starting treatment on 01/01/21.  Dionne Bucy. Sharlett Iles, BSN, RN, CIC 12/23/2020 10:23 AM

## 2020-12-25 NOTE — Progress Notes (Signed)
Pharmacist Chemotherapy Monitoring - Initial Assessment    Anticipated start date: 01/01/2021 Regimen:  . Are orders appropriate based on the patient's diagnosis, regimen, and cycle? Yes . Does the plan date match the patient's scheduled date? Yes . Is the sequencing of drugs appropriate? Yes . Are the premedications appropriate for the patient's regimen? Yes . Prior Authorization for treatment is: Pending o If applicable, is the correct biosimilar selected based on the patient's insurance? not applicable  Organ Function and Labs: Marland Kitchen Are dose adjustments needed based on the patient's renal function, hepatic function, or hematologic function? Yes . Are appropriate labs ordered prior to the start of patient's treatment? Yes . Other organ system assessment, if indicated: N/A and anthracyclines: Echo/ MUGA . The following baseline labs, if indicated, have been ordered: N/A  Dose Assessment: . Are the drug doses appropriate? yes . Are the following correct: o Drug concentrations yes o IV fluid compatible with drug yes o Administration routes yes o Timing of therapy yes . If applicable, does the patient have documented access for treatment and/or plans for port-a-cath placement? no If applicable, have lifetime cumulative doses been properly documented and assessed? not applicable Lifetime Dose Tracking  No doses have been documented on this patient for the following tracked chemicals: Doxorubicin, Epirubicin, Idarubicin, Daunorubicin, Mitoxantrone, Bleomycin, Oxaliplatin, Carboplatin, Liposomal Doxorubicin  o   Toxicity Monitoring/Prevention: . The patient has the following take home antiemetics prescribed: Ondansetron, Prochlorperazine and Dexamethasone . The patient has the following take home medications prescribed: N/A . Medication allergies and previous infusion related reactions, if applicable, have been reviewed and addressed. No . The patient's current medication list has been  assessed for drug-drug interactions with their chemotherapy regimen. no significant drug-drug interactions were identified on review.  Order Review: . Are the treatment plan orders signed? Yes . Is the patient scheduled to see a provider prior to their treatment? No  I verify that I have reviewed each item in the above checklist and answered each question accordingly.  Larene Beach, RPH, 12/25/2020  3:41 PM

## 2020-12-25 NOTE — Progress Notes (Signed)
The following biosimilar Ziextenzo (pegfilgrastim-[id number]) has been selected for use in this patient.  Kennith Center, Pharm.D., CPP 12/25/2020@1 :27 PM

## 2020-12-26 ENCOUNTER — Other Ambulatory Visit (HOSPITAL_COMMUNITY): Payer: Self-pay | Admitting: Allergy and Immunology

## 2020-12-26 MED FILL — SYMBICORT 160-4.5 MCG INH: 160-4.5 | 30 days supply | Qty: 10 | Fill #0

## 2020-12-26 MED FILL — DULoxetine HCL 60 MG CPEP: 60 | 30 days supply | Qty: 60 | Fill #2

## 2020-12-28 ENCOUNTER — Encounter: Payer: Self-pay | Admitting: Plastic Surgery

## 2020-12-30 ENCOUNTER — Ambulatory Visit (HOSPITAL_COMMUNITY)
Admission: RE | Admit: 2020-12-30 | Discharge: 2020-12-30 | Disposition: A | Discharge status: Home / Self Care | Payer: 59 | Source: Ambulatory Visit | Attending: Hematology and Oncology | Admitting: Hematology and Oncology

## 2020-12-30 ENCOUNTER — Other Ambulatory Visit: Payer: Self-pay

## 2020-12-30 DIAGNOSIS — C50412 Malignant neoplasm of upper-outer quadrant of left female breast: Secondary | ICD-10-CM | POA: Diagnosis not present

## 2020-12-30 DIAGNOSIS — I517 Cardiomegaly: Secondary | ICD-10-CM | POA: Insufficient documentation

## 2020-12-30 DIAGNOSIS — Z01818 Encounter for other preprocedural examination: Secondary | ICD-10-CM | POA: Diagnosis not present

## 2020-12-30 DIAGNOSIS — Z170 Estrogen receptor positive status [ER+]: Secondary | ICD-10-CM | POA: Diagnosis not present

## 2020-12-30 LAB — ECHOCARDIOGRAM COMPLETE
Area-P 1/2: 4.06 cm2
S' Lateral: 2.6 cm

## 2020-12-30 NOTE — Progress Notes (Signed)
  Echocardiogram 2D Echocardiogram has been performed.  Jennette Dubin 12/30/2020, 9:56 AM

## 2020-12-31 ENCOUNTER — Ambulatory Visit (INDEPENDENT_AMBULATORY_CARE_PROVIDER_SITE_OTHER): Payer: 59 | Admitting: Plastic Surgery

## 2020-12-31 ENCOUNTER — Telehealth: Payer: Self-pay | Admitting: *Deleted

## 2020-12-31 ENCOUNTER — Other Ambulatory Visit: Payer: Self-pay | Admitting: Plastic Surgery

## 2020-12-31 ENCOUNTER — Encounter: Payer: Self-pay | Admitting: *Deleted

## 2020-12-31 ENCOUNTER — Encounter: Payer: Self-pay | Admitting: Plastic Surgery

## 2020-12-31 VITALS — BP 123/70 | HR 110

## 2020-12-31 DIAGNOSIS — Z9013 Acquired absence of bilateral breasts and nipples: Secondary | ICD-10-CM

## 2020-12-31 MED ORDER — DOXYCYCLINE HYCLATE 100 MG PO TABS: 100.0000 mg | ORAL_TABLET | Freq: Two times a day (BID) | ORAL | 0 refills | Status: DC

## 2020-12-31 MED FILL — DOXYCYCLINE HYCLATE 100 MG: 100 | 14 days supply | Qty: 28 | Fill #0

## 2020-12-31 NOTE — Progress Notes (Signed)
Patient is a 45 year old female here for follow-up after undergoing bilateral breast mastectomies with left lymph node dissection with Dr. [name] and bilateral breast placement of tissue expanders and Flex HD with Dr. Marla Roe on 12/04/2020.  ~4 weeks PO She presents today with concerns for cellulitis of the right breast.  Patient reports she began having fevers of 100.7 and swelling in the right breast on 2/12 and 2/13.  Fevers broke after that and denies any current fever, chills, nausea, vomiting.  Today she presents with some significant swelling of the right breast and erythema.  Steri-Strips were removed today and bilateral incisions are intact, clean and dry.  No fluid was able to be aspirated from the right breast over the tissue expander port.  We removed 45 cc of saline from the right tissue expander using sterile technique.  Rx sent to pharmacy for doxycycline twice daily for 14 days.  Follow-up next week for recheck.  Return precautions provided.  Call office with any questions/concerns..  We placed injectable saline in the Expander using a sterile technique: Right: -45 cc for a total of 305/535 cc Left: 0 cc for a total of 350/535 cc       Pictures were obtained of the patient and placed in the chart with the patient's or guardian's permission.

## 2020-12-31 NOTE — Telephone Encounter (Signed)
Received call from pt stating she was seen by plastic surgeon today and diagnosed with right breast cellulitis.  Pt prescribed doxycycline BID x 2 weeks.  Pt due to start A/C tomorrow.  Per MD pt needing to postpone starting chemotherapy by 2 weeks or until pt finishes course of antibiotics.  Pt states she has a f/u with surgeon next week and will alert our office of any changes.  High priority message sent to scheduling to re schedule apts.

## 2021-01-01 ENCOUNTER — Inpatient Hospital Stay: Payer: 59 | Admitting: Hematology and Oncology

## 2021-01-01 ENCOUNTER — Inpatient Hospital Stay: Payer: 59

## 2021-01-02 ENCOUNTER — Telehealth: Payer: Self-pay

## 2021-01-02 ENCOUNTER — Ambulatory Visit: Payer: 59 | Admitting: Physical Therapy

## 2021-01-02 NOTE — Telephone Encounter (Signed)
Patient is a 45 year old female who called the office reporting she had noticed some drainage from her right breast incision.  She reports that she suddenly had a large amount of fluid drained from her right breast this morning.  She reports that it was yellowish in color.  She reports that she is continue to take her antibiotic.  She is not having any infectious symptoms.  She was very concerned about this and called the office to discuss.  She reports that she has an appointment scheduled for next week for reevaluation.   I discussed with the patient that based on review of her previous notes it seems as if she had a seroma of the right breast.  I discussed with the patient that sometimes this can leak from the breast incision.  I recommend she cover this area with Vaseline and gauze daily or twice daily for the next few days until she is evaluated in the clinic.  I discussed with her to call if any of her symptoms change or worsen.  I recommend she continue with the antibiotic.  I recommend she calls [place] if the opening worsens or if she notices any of the expander is exposed.  She feels comfortable doing Vaseline and gauze dressing changes until her follow-up next week.  She will be sure to call us if she has any other questions or concerns.

## 2021-01-02 NOTE — Telephone Encounter (Signed)
Patient called to say that she had a double mastectomy with reconstruction on 12/04/2020.  Patient said that she was getting cellulitis this week on her right breast and she came in and saw Phoebe Sharps, PA-C.  Patient said that the incision area on her right side was oozing and about ten minutes ago it started to pour out fluid.  Please call.

## 2021-01-03 ENCOUNTER — Inpatient Hospital Stay: Payer: 59

## 2021-01-04 ENCOUNTER — Other Ambulatory Visit: Payer: Self-pay

## 2021-01-04 ENCOUNTER — Other Ambulatory Visit: Payer: Self-pay | Admitting: Plastic Surgery

## 2021-01-04 ENCOUNTER — Inpatient Hospital Stay (HOSPITAL_COMMUNITY): Payer: 59 | Admitting: Anesthesiology

## 2021-01-04 ENCOUNTER — Ambulatory Visit (HOSPITAL_COMMUNITY)
Admission: RE | Admit: 2021-01-04 | Discharge: 2021-01-04 | Disposition: A | Discharge status: Home / Self Care | Payer: 59 | Source: Other Acute Inpatient Hospital | Attending: Plastic Surgery | Admitting: Plastic Surgery

## 2021-01-04 ENCOUNTER — Encounter (HOSPITAL_COMMUNITY): Payer: Self-pay | Admitting: Plastic Surgery

## 2021-01-04 ENCOUNTER — Encounter (HOSPITAL_COMMUNITY)
Admission: RE | Disposition: A | Discharge status: Home / Self Care | Payer: Self-pay | Source: Other Acute Inpatient Hospital | Attending: Plastic Surgery

## 2021-01-04 DIAGNOSIS — Z853 Personal history of malignant neoplasm of breast: Secondary | ICD-10-CM | POA: Insufficient documentation

## 2021-01-04 DIAGNOSIS — Z45811 Encounter for adjustment or removal of right breast implant: Secondary | ICD-10-CM | POA: Diagnosis not present

## 2021-01-04 DIAGNOSIS — Z79899 Other long term (current) drug therapy: Secondary | ICD-10-CM | POA: Insufficient documentation

## 2021-01-04 DIAGNOSIS — Z87891 Personal history of nicotine dependence: Secondary | ICD-10-CM | POA: Diagnosis not present

## 2021-01-04 DIAGNOSIS — Z79810 Long term (current) use of selective estrogen receptor modulators (SERMs): Secondary | ICD-10-CM | POA: Diagnosis not present

## 2021-01-04 DIAGNOSIS — Z7951 Long term (current) use of inhaled steroids: Secondary | ICD-10-CM | POA: Diagnosis not present

## 2021-01-04 DIAGNOSIS — C50412 Malignant neoplasm of upper-outer quadrant of left female breast: Secondary | ICD-10-CM | POA: Diagnosis not present

## 2021-01-04 DIAGNOSIS — Z20822 Contact with and (suspected) exposure to covid-19: Secondary | ICD-10-CM | POA: Insufficient documentation

## 2021-01-04 DIAGNOSIS — T8549XA Other mechanical complication of breast prosthesis and implant, initial encounter: Secondary | ICD-10-CM | POA: Diagnosis not present

## 2021-01-04 DIAGNOSIS — Z170 Estrogen receptor positive status [ER+]: Secondary | ICD-10-CM | POA: Diagnosis not present

## 2021-01-04 DIAGNOSIS — Y831 Surgical operation with implant of artificial internal device as the cause of abnormal reaction of the patient, or of later complication, without mention of misadventure at the time of the procedure: Secondary | ICD-10-CM | POA: Diagnosis not present

## 2021-01-04 DIAGNOSIS — F418 Other specified anxiety disorders: Secondary | ICD-10-CM | POA: Diagnosis not present

## 2021-01-04 HISTORY — PX: REMOVAL OF TISSUE EXPANDER AND PLACEMENT OF IMPLANT: SHX6457

## 2021-01-04 LAB — SARS CORONAVIRUS 2 BY RT PCR (HOSPITAL ORDER, PERFORMED IN ~~LOC~~ HOSPITAL LAB): SARS Coronavirus 2: NEGATIVE

## 2021-01-04 LAB — POCT PREGNANCY, URINE: Preg Test, Ur: NEGATIVE

## 2021-01-04 SURGERY — REMOVAL, TISSUE EXPANDER, BREAST, WITH IMPLANT INSERTION
Anesthesia: General | Site: Breast | Laterality: Right

## 2021-01-04 MED ORDER — THROMBIN (RECOMBINANT) 5000 UNITS EX SOLR
CUTANEOUS | Status: AC
  Filled 2021-01-04: qty 5000

## 2021-01-04 MED ORDER — THROMBIN 5000 UNITS EX SOLR
CUTANEOUS | Status: DC | PRN
  Administered 2021-01-04 (×2): 5000 [IU] via TOPICAL

## 2021-01-04 MED ORDER — FENTANYL CITRATE (PF) 100 MCG/2ML IJ SOLN
INTRAMUSCULAR | Status: AC
  Filled 2021-01-04: qty 2

## 2021-01-04 MED ORDER — HYDROCODONE-ACETAMINOPHEN 5-325 MG PO TABS
1.0000 | ORAL_TABLET | Freq: Three times a day (TID) | ORAL | 0 refills | Status: DC | PRN
Start: 2021-01-04 — End: 2021-01-04

## 2021-01-04 MED ORDER — CIPROFLOXACIN IN D5W 400 MG/200ML IV SOLN
400.0000 mg | Freq: Once | INTRAVENOUS | Status: AC
  Administered 2021-01-04: 400 mg via INTRAVENOUS
  Filled 2021-01-04: qty 200

## 2021-01-04 MED ORDER — AMISULPRIDE (ANTIEMETIC) 5 MG/2ML IV SOLN
INTRAVENOUS | Status: AC
  Filled 2021-01-04: qty 2

## 2021-01-04 MED ORDER — PHENYLEPHRINE 40 MCG/ML (10ML) SYRINGE FOR IV PUSH (FOR BLOOD PRESSURE SUPPORT)
PREFILLED_SYRINGE | INTRAVENOUS | Status: AC
  Filled 2021-01-04: qty 10

## 2021-01-04 MED ORDER — SUCCINYLCHOLINE CHLORIDE 20 MG/ML IJ SOLN
INTRAMUSCULAR | Status: DC | PRN
  Administered 2021-01-04: 100 mg via INTRAVENOUS

## 2021-01-04 MED ORDER — ACETAMINOPHEN 10 MG/ML IV SOLN
1000.0000 mg | Freq: Once | INTRAVENOUS | Status: DC | PRN
  Administered 2021-01-04: 1000 mg via INTRAVENOUS

## 2021-01-04 MED ORDER — PROPOFOL 10 MG/ML IV BOLUS
INTRAVENOUS | Status: AC
  Filled 2021-01-04: qty 20

## 2021-01-04 MED ORDER — CIPROFLOXACIN IN D5W 400 MG/200ML IV SOLN
INTRAVENOUS | Status: AC
  Filled 2021-01-04: qty 200

## 2021-01-04 MED ORDER — LIDOCAINE 2% (20 MG/ML) 5 ML SYRINGE
INTRAMUSCULAR | Status: DC | PRN
  Administered 2021-01-04: 60 mg via INTRAVENOUS

## 2021-01-04 MED ORDER — PROMETHAZINE HCL 25 MG/ML IJ SOLN: 6.2500 mg | INTRAMUSCULAR | Status: DC | PRN

## 2021-01-04 MED ORDER — MIDAZOLAM HCL 2 MG/2ML IJ SOLN
INTRAMUSCULAR | Status: AC
  Filled 2021-01-04: qty 2

## 2021-01-04 MED ORDER — CHLORHEXIDINE GLUCONATE 0.12 % MT SOLN
15.0000 mL | Freq: Once | OROMUCOSAL | Status: AC
  Administered 2021-01-04: 15 mL via OROMUCOSAL
  Filled 2021-01-04: qty 15

## 2021-01-04 MED ORDER — PROPOFOL 10 MG/ML IV BOLUS
INTRAVENOUS | Status: DC | PRN
  Administered 2021-01-04 (×2): 100 mg via INTRAVENOUS
  Administered 2021-01-04: 200 mg via INTRAVENOUS

## 2021-01-04 MED ORDER — ESMOLOL HCL 100 MG/10ML IV SOLN
INTRAVENOUS | Status: AC
  Filled 2021-01-04: qty 10

## 2021-01-04 MED ORDER — ESMOLOL HCL 100 MG/10ML IV SOLN
INTRAVENOUS | Status: DC | PRN
  Administered 2021-01-04 (×2): 10 mg via INTRAVENOUS

## 2021-01-04 MED ORDER — DEXAMETHASONE SODIUM PHOSPHATE 10 MG/ML IJ SOLN
INTRAMUSCULAR | Status: AC
  Filled 2021-01-04: qty 1

## 2021-01-04 MED ORDER — ORAL CARE MOUTH RINSE: 15.0000 mL | Freq: Once | OROMUCOSAL | Status: AC

## 2021-01-04 MED ORDER — FENTANYL CITRATE (PF) 250 MCG/5ML IJ SOLN
INTRAMUSCULAR | Status: DC | PRN
  Administered 2021-01-04: 50 ug via INTRAVENOUS
  Administered 2021-01-04: 150 ug via INTRAVENOUS
  Administered 2021-01-04 (×3): 50 ug via INTRAVENOUS

## 2021-01-04 MED ORDER — LIDOCAINE 2% (20 MG/ML) 5 ML SYRINGE
INTRAMUSCULAR | Status: AC
  Filled 2021-01-04: qty 5

## 2021-01-04 MED ORDER — FENTANYL CITRATE (PF) 250 MCG/5ML IJ SOLN
INTRAMUSCULAR | Status: AC
  Filled 2021-01-04: qty 5

## 2021-01-04 MED ORDER — 0.9 % SODIUM CHLORIDE (POUR BTL) OPTIME
TOPICAL | Status: DC | PRN
Start: 2021-01-04 — End: 2021-01-04
  Administered 2021-01-04: 4000 mL

## 2021-01-04 MED ORDER — LACTATED RINGERS IV SOLN: INTRAVENOUS | Status: DC

## 2021-01-04 MED ORDER — ACETAMINOPHEN 325 MG PO TABS: 650.0000 mg | ORAL_TABLET | ORAL | Status: DC | PRN

## 2021-01-04 MED ORDER — SODIUM CHLORIDE 0.9% FLUSH: 3.0000 mL | INTRAVENOUS | Status: DC | PRN

## 2021-01-04 MED ORDER — PHENYLEPHRINE 40 MCG/ML (10ML) SYRINGE FOR IV PUSH (FOR BLOOD PRESSURE SUPPORT)
PREFILLED_SYRINGE | INTRAVENOUS | Status: DC | PRN
  Administered 2021-01-04 (×3): 80 ug via INTRAVENOUS

## 2021-01-04 MED ORDER — OXYCODONE HCL 5 MG PO TABS
5.0000 mg | ORAL_TABLET | Freq: Once | ORAL | Status: AC | PRN
  Administered 2021-01-04: 5 mg via ORAL

## 2021-01-04 MED ORDER — LIDOCAINE-EPINEPHRINE (PF) 1 %-1:200000 IJ SOLN
INTRAMUSCULAR | Status: DC | PRN
  Administered 2021-01-04: 20 mL

## 2021-01-04 MED ORDER — LIDOCAINE-EPINEPHRINE 1 %-1:100000 IJ SOLN
INTRAMUSCULAR | Status: AC
  Filled 2021-01-04: qty 1

## 2021-01-04 MED ORDER — OXYCODONE HCL 5 MG PO TABS: 5.0000 mg | ORAL_TABLET | ORAL | Status: DC | PRN

## 2021-01-04 MED ORDER — DIAZEPAM 2 MG PO TABS: 2.0000 mg | ORAL_TABLET | Freq: Two times a day (BID) | ORAL | 0 refills | Status: DC | PRN

## 2021-01-04 MED ORDER — SODIUM CHLORIDE 0.9 % IV SOLN
INTRAVENOUS | Status: DC | PRN
  Administered 2021-01-04: 1000 mL

## 2021-01-04 MED ORDER — FENTANYL CITRATE (PF) 100 MCG/2ML IJ SOLN
25.0000 ug | INTRAMUSCULAR | Status: DC | PRN
  Administered 2021-01-04: 50 ug via INTRAVENOUS

## 2021-01-04 MED ORDER — ONDANSETRON HCL 4 MG/2ML IJ SOLN
INTRAMUSCULAR | Status: AC
  Filled 2021-01-04: qty 2

## 2021-01-04 MED ORDER — ACETAMINOPHEN 10 MG/ML IV SOLN
INTRAVENOUS | Status: AC
  Filled 2021-01-04: qty 100

## 2021-01-04 MED ORDER — ACETAMINOPHEN 650 MG RE SUPP: 650.0000 mg | RECTAL | Status: DC | PRN

## 2021-01-04 MED ORDER — SODIUM CHLORIDE 0.9 % IV SOLN: 250.0000 mL | INTRAVENOUS | Status: DC | PRN

## 2021-01-04 MED ORDER — CEFAZOLIN SODIUM-DEXTROSE 2-4 GM/100ML-% IV SOLN
2.0000 g | INTRAVENOUS | Status: AC
  Administered 2021-01-04: 2 mg via INTRAVENOUS
  Filled 2021-01-04: qty 100

## 2021-01-04 MED ORDER — MIDAZOLAM HCL 5 MG/5ML IJ SOLN
INTRAMUSCULAR | Status: DC | PRN
  Administered 2021-01-04: 2 mg via INTRAVENOUS

## 2021-01-04 MED ORDER — AMISULPRIDE (ANTIEMETIC) 5 MG/2ML IV SOLN
10.0000 mg | Freq: Once | INTRAVENOUS | Status: AC | PRN
  Administered 2021-01-04: 10 mg via INTRAVENOUS

## 2021-01-04 MED ORDER — OXYCODONE HCL 5 MG PO TABS
ORAL_TABLET | ORAL | Status: AC
  Filled 2021-01-04: qty 1

## 2021-01-04 MED ORDER — ONDANSETRON HCL 4 MG/2ML IJ SOLN
INTRAMUSCULAR | Status: DC | PRN
  Administered 2021-01-04: 4 mg via INTRAVENOUS

## 2021-01-04 MED ORDER — FENTANYL CITRATE (PF) 100 MCG/2ML IJ SOLN: 25.0000 ug | INTRAMUSCULAR | Status: DC | PRN

## 2021-01-04 MED ORDER — GENTAMICIN SULFATE 40 MG/ML IJ SOLN
INTRAMUSCULAR | Status: AC
  Filled 2021-01-04: qty 2

## 2021-01-04 MED ORDER — OXYCODONE HCL 5 MG/5ML PO SOLN: 5.0000 mg | Freq: Once | ORAL | Status: AC | PRN

## 2021-01-04 MED ORDER — DEXAMETHASONE SODIUM PHOSPHATE 10 MG/ML IJ SOLN
INTRAMUSCULAR | Status: DC | PRN
  Administered 2021-01-04: 10 mg via INTRAVENOUS

## 2021-01-04 MED ORDER — SODIUM CHLORIDE 0.9% FLUSH: 3.0000 mL | Freq: Two times a day (BID) | INTRAVENOUS | Status: DC

## 2021-01-04 MED ORDER — CHLORHEXIDINE GLUCONATE CLOTH 2 % EX PADS: 6.0000 | MEDICATED_PAD | Freq: Once | CUTANEOUS | Status: DC

## 2021-01-04 SURGICAL SUPPLY — 52 items
BINDER BREAST XXLRG (GAUZE/BANDAGES/DRESSINGS) ×2 IMPLANT
BIOPATCH RED 1 DISK 7.0 (GAUZE/BANDAGES/DRESSINGS) ×2 IMPLANT
CANISTER SUCT 3000ML PPV (MISCELLANEOUS) ×2 IMPLANT
CLSR STERI-STRIP ANTIMIC 1/2X4 (GAUZE/BANDAGES/DRESSINGS) ×2 IMPLANT
COVER SURGICAL LIGHT HANDLE (MISCELLANEOUS) ×2 IMPLANT
DERMABOND ADVANCED (GAUZE/BANDAGES/DRESSINGS) ×1
DERMABOND ADVANCED .7 DNX12 (GAUZE/BANDAGES/DRESSINGS) ×1 IMPLANT
DRAIN CHANNEL 19F RND (DRAIN) ×2 IMPLANT
DRAPE HALF SHEET 40X57 (DRAPES) ×4 IMPLANT
DRAPE ORTHO SPLIT 77X108 STRL (DRAPES) ×2
DRAPE SURG 17X23 STRL (DRAPES) ×8 IMPLANT
DRAPE SURG ORHT 6 SPLT 77X108 (DRAPES) ×2 IMPLANT
DRAPE WARM FLUID 44X44 (DRAPES) ×2 IMPLANT
DRSG OPSITE POSTOP 4X8 (GAUZE/BANDAGES/DRESSINGS) ×2 IMPLANT
DRSG PAD ABDOMINAL 8X10 ST (GAUZE/BANDAGES/DRESSINGS) ×4 IMPLANT
ELECT BLADE 4.0 EZ CLEAN MEGAD (MISCELLANEOUS) ×2
ELECT REM PT RETURN 9FT ADLT (ELECTROSURGICAL) ×2
ELECTRODE BLDE 4.0 EZ CLN MEGD (MISCELLANEOUS) ×1 IMPLANT
ELECTRODE REM PT RTRN 9FT ADLT (ELECTROSURGICAL) ×1 IMPLANT
EVACUATOR SILICONE 100CC (DRAIN) ×2 IMPLANT
GAUZE SPONGE 4X4 12PLY STRL (GAUZE/BANDAGES/DRESSINGS) IMPLANT
GLOVE BIO SURGEON STRL SZ 6.5 (GLOVE) ×10 IMPLANT
GLOVE SURG POLYISO LF SZ6.5 (GLOVE) ×8 IMPLANT
GOWN STRL REUS W/ TWL LRG LVL3 (GOWN DISPOSABLE) ×2 IMPLANT
GOWN STRL REUS W/TWL LRG LVL3 (GOWN DISPOSABLE) ×2
GRAFT FLEX HD 6X16 PLIABLE (Tissue) ×2 IMPLANT
IMPL EXPANDER BREAST 535CC (Breast) ×1 IMPLANT
IMPLANT BREAST 535CC (Breast) ×1 IMPLANT
IMPLANT EXPANDER BREAST 535CC (Breast) ×1 IMPLANT
KIT BASIN OR (CUSTOM PROCEDURE TRAY) ×2 IMPLANT
KIT FILL ASEPTIC TRANSFER (MISCELLANEOUS) ×2 IMPLANT
KIT TURNOVER KIT B (KITS) ×2 IMPLANT
NS IRRIG 1000ML POUR BTL (IV SOLUTION) ×8 IMPLANT
PACK GENERAL/GYN (CUSTOM PROCEDURE TRAY) ×2 IMPLANT
PAD ARMBOARD 7.5X6 YLW CONV (MISCELLANEOUS) ×2 IMPLANT
PIN SAFETY STERILE (MISCELLANEOUS) ×2 IMPLANT
SET ASEPTIC TRANSFER (MISCELLANEOUS) IMPLANT
SET CYSTO W/LG BORE CLAMP LF (SET/KITS/TRAYS/PACK) ×2 IMPLANT
SOL PREP PROV IODINE SCRUB 4OZ (MISCELLANEOUS) ×2 IMPLANT
STAPLER VISISTAT 35W (STAPLE) ×2 IMPLANT
SUT MNCRL+ AB 3-0 CT1 36 (SUTURE) ×1 IMPLANT
SUT MON AB 4-0 PS1 27 (SUTURE) ×2 IMPLANT
SUT MON AB 5-0 PS2 18 (SUTURE) ×2 IMPLANT
SUT MONOCRYL AB 3-0 CT1 36IN (SUTURE) ×1
SUT PDS AB 2-0 CT1 27 (SUTURE) ×12 IMPLANT
SUT SILK 3 0 PS 1 (SUTURE) ×2 IMPLANT
SUT SILK 3 0 SH 30 (SUTURE) IMPLANT
SUT VIC AB 3-0 SH 27 (SUTURE)
SUT VIC AB 3-0 SH 27X BRD (SUTURE) IMPLANT
SUT VICRYL 4-0 PS2 18IN ABS (SUTURE) ×2 IMPLANT
TOWEL GREEN STERILE (TOWEL DISPOSABLE) ×2 IMPLANT
TOWEL GREEN STERILE FF (TOWEL DISPOSABLE) ×2 IMPLANT

## 2021-01-04 NOTE — Anesthesia Procedure Notes (Signed)
Procedure Name: Intubation Date/Time: 01/04/2021 10:58 AM Performed by: Kyung Rudd, CRNA Pre-anesthesia Checklist: Patient identified, Emergency Drugs available, Suction available and Patient being monitored Patient Re-evaluated:Patient Re-evaluated prior to induction Oxygen Delivery Method: Circle system utilized Preoxygenation: Pre-oxygenation with 100% oxygen Induction Type: IV induction Ventilation: Mask ventilation without difficulty Laryngoscope Size: Glidescope and 3 Tube type: Oral Tube size: 7.0 mm Number of attempts: 1 Airway Equipment and Method: Stylet and Video-laryngoscopy Placement Confirmation: ETT inserted through vocal cords under direct vision,  positive ETCO2 and breath sounds checked- equal and bilateral Secured at: 20 cm Tube secured with: Tape Dental Injury: Teeth and Oropharynx as per pre-operative assessment

## 2021-01-04 NOTE — Progress Notes (Signed)
Discharge criteria met. Patient is awake and alert, vital signs stable. Pain level is reported to be at tolerable level per patient. Discharge instructions printed and discussed with patient and patietns spouse "Jennifer Davis" via phone. Copy of instructions provided to patient. Patient assisted with dressing, peripheral IV removed. Patient escorted to lobby by RN via wheelchair, patients family to drive patient home.

## 2021-01-04 NOTE — Anesthesia Preprocedure Evaluation (Addendum)
Anesthesia Evaluation  Patient identified by MRN, date of birth, ID band Patient awake    Reviewed: Allergy & Precautions, NPO status , Patient's Chart, lab work & pertinent test results  Airway Mallampati: II  TM Distance: >3 FB Neck ROM: Full    Dental no notable dental hx.    Pulmonary shortness of breath, asthma , former smoker,    Pulmonary exam normal breath sounds clear to auscultation       Cardiovascular negative cardio ROS Normal cardiovascular exam Rhythm:Regular Rate:Normal  ECHO: 1. Left ventricular ejection fraction, by estimation, is 65 to 70%. The left ventricle has normal function. The left ventricle has no regional wall motion abnormalities. There is mild concentric left ventricular hypertrophy. Left ventricular diastolic parameters were normal. The average left ventricular global longitudinal strain is -23.0 %. The global longitudinal strain is normal. 2. Right ventricular systolic function is normal. The right ventricular size is normal. 3. The mitral valve is grossly normal. No evidence of mitral valve regurgitation. No evidence of mitral stenosis. 4. The aortic valve was not well visualized. Aortic valve regurgitation is not visualized. No aortic stenosis is present.   Neuro/Psych PSYCHIATRIC DISORDERS Anxiety Depression PTSD (post-traumatic stress disorder)negative neurological ROS     GI/Hepatic negative GI ROS, Neg liver ROS,   Endo/Other  Morbid obesity  Renal/GU negative Renal ROS     Musculoskeletal negative musculoskeletal ROS (+)   Abdominal (+) + obese,   Peds  Hematology negative hematology ROS (+)   Anesthesia Other Findings Breast cancer  Reproductive/Obstetrics hcg negative                           Anesthesia Physical Anesthesia Plan  ASA: III  Anesthesia Plan: General   Post-op Pain Management:    Induction: Intravenous  PONV Risk Score and  Plan: 3 and Ondansetron, Dexamethasone, Midazolam and Treatment may vary due to age or medical condition  Airway Management Planned: LMA  Additional Equipment:   Intra-op Plan:   Post-operative Plan: Extubation in OR  Informed Consent: I have reviewed the patients History and Physical, chart, labs and discussed the procedure including the risks, benefits and alternatives for the proposed anesthesia with the patient or authorized representative who has indicated his/her understanding and acceptance.     Dental advisory given  Plan Discussed with: CRNA  Anesthesia Plan Comments:      Anesthesia Quick Evaluation

## 2021-01-04 NOTE — Anesthesia Postprocedure Evaluation (Signed)
Anesthesia Post Note  Patient: Jennifer Davis  Procedure(s) Performed: REMOVAL OF TISSUE EXPANDER AND PLACEMENT OF NEW EXPANDER (Right Breast)     Patient location during evaluation: PACU Anesthesia Type: General Level of consciousness: awake Pain management: pain level controlled Vital Signs Assessment: post-procedure vital signs reviewed and stable Respiratory status: spontaneous breathing, nonlabored ventilation, respiratory function stable and patient connected to nasal cannula oxygen Cardiovascular status: blood pressure returned to baseline and stable Postop Assessment: no apparent nausea or vomiting Anesthetic complications: no   No complications documented.  Last Vitals:  Vitals:   01/04/21 1400 01/04/21 1410  BP: 137/86 (!) 144/72  Pulse: 96 95  Resp: 17 12  Temp: (!) 36.2 C   SpO2: 94% 96%    Last Pain:  Vitals:   01/04/21 1410  PainSc: 4                  Ryan P Ellender

## 2021-01-04 NOTE — Op Note (Signed)
Op report    DATE OF OPERATION:  01/04/2021  LOCATION: [facility]  SURGICAL DIVISION: Plastic Surgery  PREOPERATIVE DIAGNOSES:  1. History of Breast cancer.   2. Exposure of right breast expander.  POSTOPERATIVE DIAGNOSES:  Same as preoperative.   PROCEDURE:  1. Removal of right breast expander and placement of new Acellular Dermal Matrix and tissue expander.  SURGEON: Brizza Nathanson Sanger Fenris Cauble, DO  ANESTHESIA:  General.   COMPLICATIONS: None.   IMPLANT: Right - [place] 535 cc. Ref #[id number].  150 cc of injectable saline placed in the expander. Acellular Dermal Matrix 6 x 16 cm Flex HD  INDICATIONS FOR PROCEDURE:  The patient, Jennifer Davis, is a 45 y.o. female born on August 10, 1976, is here for removal of exposed right breast expander and placement of new tissue expander and Acellular dermal matrix. MRN: [id number]  CONSENT:  Informed consent was obtained directly from the patient. Risks, benefits and alternatives were fully discussed. Specific risks including but not limited to bleeding, infection, hematoma, seroma, scarring, pain, implant infection, implant extrusion, capsular contracture, asymmetry, wound healing problems, and need for further surgery were all discussed. The patient did have an ample opportunity to have her questions answered to her satisfaction.   DESCRIPTION OF PROCEDURE:  The patient was taken to the operating room. SCDs were placed and IV antibiotics were given. The patient's chest was prepped and draped in a sterile fashion. A time out was performed and the implants to be used were identified.    Right:  The previous incision was excised.  The expander was removed.  There was a small amount of FlexHD identified.  It was not attached to the muscle and only attached to the lateral chest wall by 2 cm. This was removed.  The pocket was irrigated with antibiotic solution and hemostasis was achieved with electrocautery and thrombin.  The  pocket had closed down and this was released to provide for expansion and closure.  The ADM was then prepared according to the manufacture guidelines and slits placed to help with postoperative fluid management.  The ADM was then sutured to the inferior and lateral edge of the inframammary fold with 2-0 PDS starting with an interrupted stitch and then a running stitch.  The lateral portion was sutured to with interrupted sutures after the expander was placed.  The expander was prepared according to the manufacture guidelines, the air evacuated and then it was placed under the ADM and pectoralis major muscle.  The inferior and lateral tabs were used to secure the expander to the chest wall with 2-0 PDS.  The drain was placed at the inframammary fold over the ADM and secured to the skin with 3-0 Silk.    The deep layers were closed with 3-0 Monocryl followed by 4-0 Monocryl.  The skin was closed with 5-0 Monocryl and then dermabond was applied.  I did not see any purulence or sign of obvious infection. The ABDs and breast binder were placed.  The patient tolerated the procedure well and there were no complications.  The patient was allowed to wake from anesthesia and taken to the recovery room in satisfactory condition.

## 2021-01-04 NOTE — H&P (Signed)
Jennifer Davis is an 45 y.o. female.    Chief Complaint: right breast expander exposure HPI:  The patient is a 45 yrs old female here with an exposed right breast expander.  Her skin opened over the last day.  She had a seroma and this likely caused pressure.  She underwent bilateral mastectomies with Dr. [name] 1/26.  We placed expanders and FlexHD at the time of the surgery.  The incisions were intact at her last visit a few days ago.  She called last night with the news of the change.  She is otherwise doing well.   The left expander has 350 cc and the right had 305 cc.   Past Medical History:  Diagnosis Date  . Anxiety   . Asthma   . Breast cancer ([place])   . GERD (gastroesophageal reflux disease)     Past Surgical History:  Procedure Laterality Date  . BREAST RECONSTRUCTION WITH PLACEMENT OF TISSUE EXPANDER AND FLEX HD (ACELLULAR HYDRATED DERMIS) Bilateral 12/04/2020   Procedure: IMMEDIATE BILATERAL BREAST RECONSTRUCTION WITH PLACEMENT OF TISSUE EXPANDER AND FLEX HD (ACELLULAR HYDRATED DERMIS);  Surgeon: Wallace Going, DO;  Location: [facility];  Service: Plastics;  Laterality: Bilateral;  . CESAREAN SECTION     x3  . MASTECTOMY WITH AXILLARY LYMPH NODE DISSECTION Left 12/04/2020   Procedure: BILATERAL MASTECTOMIES, RADIOACTIVE SEED GUIDED TARGETED LEFT AXILLARY NODE DISSECTION;  Surgeon: Coralie Keens, MD;  Location: [facility];  Service: General;  Laterality: Left;  . PORTACATH PLACEMENT Left 12/04/2020   Procedure: INSERTION PORT-A-CATH LEFT SUBCLAVIAN;  Surgeon: Coralie Keens, MD;  Location: [facility];  Service: General;  Laterality: Left;    Family History  Problem Relation Age of Onset  . Hypertension Mother   . Fibromyalgia Mother   . Colon cancer Mother 51  . Asthma Sister        x2  . Yves Dill Parkinson White syndrome Maternal Aunt 2  . Hypertension Maternal Grandmother   . Hypertension Maternal Grandfather   .  Cancer - Lung Maternal Grandfather   . Stroke Maternal Grandfather   . Colon cancer Other        MGM's niece, dx unknown age  . Colon cancer Other        MGM's nephew; dx unknown age  . Breast cancer Neg Hx    Social History:  reports that she quit smoking about 8 years ago. Her smoking use included cigarettes. She has never used smokeless tobacco. She reports previous alcohol use. She reports that she does not use drugs.  Allergies: No Known Allergies  Medications Prior to Admission  Medication Sig Dispense Refill  . acetaminophen (TYLENOL) 500 MG tablet Take 1 tablet (500 mg total) by mouth every 6 (six) hours as needed. For use AFTER surgery (Patient taking differently: Take 500 mg by mouth every 6 (six) hours as needed for mild pain, fever or headache.) 30 tablet 0  . busPIRone (BUSPAR) 30 MG tablet Take 1 tablet (30 mg total) by mouth 2 (two) times daily. 60 tablet 5  . cetirizine (ZYRTEC) 10 MG tablet Take 10 mg by mouth daily.    . diazePAM (VALIUM PO) Take 1 tablet by mouth as needed (muscle spasms).    Marland Kitchen doxycycline (VIBRA-TABS) 100 MG tablet Take 1 tablet (100 mg total) by mouth 2 (two) times daily for 14 days. 28 tablet 0  . DULoxetine (CYMBALTA) 60 MG capsule Take 2 capsules (120 mg total) by mouth daily. 60 capsule  5  . EPINEPHrine (EPIPEN 2-PAK) 0.3 mg/0.3 mL IJ SOAJ injection Inject 0.3 mLs (0.3 mg total) into the muscle once as needed for up to 1 dose (for severe allergic reaction). CAll 911 immediately if you have to use this medicine 1 each 1  . gabapentin (NEURONTIN) 100 MG capsule Take 1 capsule (100 mg total) by mouth 3 (three) times daily as needed. (Patient taking differently: Take 100 mg by mouth 3 (three) times daily as needed (neuropathy).) 90 capsule 1  . ibuprofen (ADVIL) 600 MG tablet Take 1 tablet (600 mg total) by mouth every 6 (six) hours as needed for mild pain or moderate pain. For use AFTER surgery 30 tablet 0  . lidocaine-prilocaine (EMLA) cream Apply to  affected area once (Patient taking differently: Apply 1 application topically as needed (port access).) 30 g 3  . melatonin 5 MG TABS Take 5 mg by mouth at bedtime as needed (sleep).    . montelukast (SINGULAIR) 10 MG tablet Take 10 mg by mouth daily.  5  . Multiple Vitamin (MULTIVITAMIN WITH MINERALS) TABS tablet Take 1 tablet by mouth daily.    . Multiple Vitamins-Minerals (MULTIVITAMIN PO) Take 2 tablets by mouth daily. Skin/hair/nail supplement.    Marland Kitchen PROAIR HFA 108 (90 Base) MCG/ACT inhaler Inhale 2 puffs into the lungs as needed for wheezing or shortness of breath.  0  . prochlorperazine (COMPAZINE) 10 MG tablet Take 1 tablet (10 mg total) by mouth every 6 (six) hours as needed (Nausea or vomiting). 30 tablet 1  . SYMBICORT 160-4.5 MCG/ACT inhaler Inhale 2 puffs into the lungs 2 (two) times daily.  5  . dexamethasone (DECADRON) 4 MG tablet Take 1 tablet (4 mg total) by mouth daily. Take 1 tab day after chemo and 1 tab 2 days after chemo. Take with food. 8 tablet 0  . LORazepam (ATIVAN) 0.5 MG tablet Take 0.5 mg by mouth at bedtime as needed (nausea).    . ondansetron (ZOFRAN) 8 MG tablet Take 1 tablet (8 mg total) by mouth 2 (two) times daily as needed. Start on the third day after chemotherapy. 30 tablet 1  . ondansetron (ZOFRAN-ODT) 4 MG disintegrating tablet Take 1 tablet (4 mg total) by mouth every 8 (eight) hours as needed for nausea or vomiting. (Patient not taking: No sig reported) 20 tablet 0  . tamoxifen (NOLVADEX) 10 MG tablet Take 1 tablet (10 mg total) by mouth daily. 30 tablet 0    Results for orders placed or performed during the hospital encounter of 01/04/21 (from the past 48 hour(s))  SARS Coronavirus 2 by RT PCR (hospital order, performed in [facility] hospital lab) Nasopharyngeal Nasopharyngeal Swab     Status: None   Collection Time: 01/04/21  7:43 AM   Specimen: Nasopharyngeal Swab  Result Value Ref Range   SARS Coronavirus 2 NEGATIVE NEGATIVE    Comment:  (NOTE) SARS-CoV-2 target nucleic acids are NOT DETECTED.  The SARS-CoV-2 RNA is generally detectable in upper and lower respiratory specimens during the acute phase of infection. The lowest concentration of SARS-CoV-2 viral copies this assay can detect is 250 copies / mL. A negative result does not preclude SARS-CoV-2 infection and should not be used as the sole basis for treatment or other patient management decisions.  A negative result may occur with improper specimen collection / handling, submission of specimen other than nasopharyngeal swab, presence of viral mutation(s) within the areas targeted by this assay, and inadequate number of viral copies (<250 copies / mL).  A negative result must be combined with clinical observations, patient history, and epidemiological information.  Fact Sheet for Patients:   StrictlyIdeas.no  Fact Sheet for Healthcare Providers: BankingDealers.co.za  This test is not yet approved or  cleared by the [place] FDA and has been authorized for detection and/or diagnosis of SARS-CoV-2 by FDA under an Emergency Use Authorization (EUA).  This EUA will remain in effect (meaning this test can be used) for the duration of the COVID-19 declaration under Section 564(b)(1) of the Act, 21 U.S.C. section 360bbb-3(b)(1), unless the authorization is terminated or revoked sooner.  Performed at [facility], [place] [address], [place], [place] [address]   Pregnancy, urine POC     Status: None   Collection Time: 01/04/21  8:21 AM  Result Value Ref Range   Preg Test, Ur NEGATIVE NEGATIVE    Comment:        THE SENSITIVITY OF THIS METHODOLOGY IS >24 mIU/mL    No results found.  Review of Systems  Constitutional: Negative for activity change.  HENT: Negative.   Eyes: Negative.   Respiratory: Negative.   Cardiovascular: Negative.   Gastrointestinal: Negative.   Endocrine: Negative.   Genitourinary:  Negative.   Skin: Positive for color change and wound.  Hematological: Negative.     Last menstrual period 12/19/2020. Physical Exam Vitals and nursing note reviewed.  Constitutional:      Appearance: Normal appearance.  HENT:     Head: Normocephalic and atraumatic.  Cardiovascular:     Rate and Rhythm: Normal rate.     Pulses: Normal pulses.  Pulmonary:     Effort: Pulmonary effort is normal.  Chest:    Abdominal:     General: There is no distension.  Neurological:     General: No focal deficit present.     Mental Status: She is alert and oriented to person, place, and time.  Psychiatric:        Mood and Affect: Mood normal.        Behavior: Behavior normal.        Thought Content: Thought content normal.      Assessment/Plan Right breast expander exposure.  Plan for removal and replacement of expander and Flex HD.  We discussed the different options including not replacing the expander.  At this time we feel it is worth the try to replace it.    [place], DO 01/04/2021, 9:25 AM

## 2021-01-04 NOTE — Discharge Instructions (Addendum)
INSTRUCTIONS FOR AFTER SURGERY   You will likely have some questions about what to expect following your operation.  The following information will help you and your family understand what to expect when you are discharged from the hospital.  Following these guidelines will help ensure a smooth recovery and reduce risks of complications.  Postoperative instructions include information on: diet, wound care, medications and physical activity.  AFTER SURGERY Expect to go home after the procedure.  In some cases, you may need to spend one night in the hospital for observation.  DIET This surgery does not require a specific diet.  However, I have to mention that the healthier you eat the better your body can start healing. It is important to increasing your protein intake.  This means limiting the foods with added sugar.  Focus on fruits and vegetables and some meat. It is very important to drink water after your surgery.  If your urine is bright yellow, then it is concentrated, and you need to drink more water.  As a general rule after surgery, you should have 8 ounces of water every hour while awake.  If you find you are persistently nauseated or unable to take in liquids let us know.  NO TOBACCO USE or EXPOSURE.  This will slow your healing process and increase the risk of a wound.  WOUND CARE If you have a drain: Clean with baby wipes for 3-5 days and then you can shower.  If you have a binder you may remove it to shower and then put it back on. If you have steri-strips / tape directly attached to your skin leave them in place. It is OK to get these wet.  No baths, pools or hot tubs for two weeks. We close your incision to leave the smallest and best-looking scar. No ointment or creams on your incisions until given the go ahead.  Especially not Neosporin (Too many skin reactions with this one).  A few weeks after surgery you can use Mederma and start massaging the scar. We ask you to wear your binder or  sports bra for the first 6 weeks around the clock, including while sleeping. This provides added comfort and helps reduce the fluid accumulation at the surgery site.  ACTIVITY No heavy lifting until cleared by the doctor.  It is OK to walk and climb stairs. In fact, moving your legs is very important to decrease your risk of a blood clot.  It will also help keep you from getting deconditioned.  Every 1 to 2 hours get up and walk for 5 minutes. This will help with a quicker recovery back to normal.  Let pain be your guide so you don't do too much.  NO, you cannot do the spring cleaning and don't plan on taking care of anyone else.  This is your time for TLC.   WORK Everyone returns to work at different times. As a rough guide, most people take at least 1 - 2 weeks off prior to returning to work. If you need documentation for your job, bring the forms to your postoperative follow up visit.  DRIVING Arrange for someone to bring you home from the hospital.  You may be able to drive a few days after surgery but not while taking any narcotics or valium.  BOWEL MOVEMENTS Constipation can occur after anesthesia and while taking pain medication.  It is important to stay ahead for your comfort.  We recommend taking Milk of Magnesia (2 tablespoons; twice  a day) while taking the pain pills.  SEROMA This is fluid your body tried to put in the surgical site.  This is normal but if it creates excessive pain and swelling let us know.  It usually decreases in a few weeks.  MEDICATIONS and PAIN CONTROL At your preoperative visit for you history and physical you were given the following medications: 1. An antibiotic: Start this medication when you get home and take according to the instructions on the bottle. 2. Zofran 4 mg:  This is to treat nausea and vomiting.  You can take this every 6 hours as needed and only if needed. 3. Norco (hydrocodone/acetaminophen) 5/325 mg:  This is only to be used after you have  taken the motrin or the tylenol. Every 8 hours as needed. Over the counter Medication to take: 4. Ibuprofen (Motrin) 600 mg:  Take this every 6 hours.  If you have additional pain then take 500 mg of the tylenol.  Only take the Norco after you have tried these two. 5. Miralax or stool softener of choice: Take this according to the bottle if you take the [place] Call your surgeon's office if any of the following occur:  Fever 101 degrees F or greater  Excessive bleeding or fluid from the incision site.  Pain that increases over time without aid from the medications  Redness, warmth, or pus draining from incision sites  Persistent nausea or inability to take in liquids  Severe misshapen area that underwent the operation. [facility] Plastic Surgery Specialist  What is the benefit of having a drain?  During surgery your tissue layers are separated.  This raw surface stimulates your body to fill the space with serous fluid.  This is normal but you don't want that fluid to collect and prevent healing.  A fluid collection can also become infected.  The Jackson-Pratt (JP) drain is used to eliminate this collection of fluid and allow the tissue to heal together.    Jackson-Pratt (JP) bulb    How to care for your drainage and suction unit at home Your drainage catheter will be connected to a collection device. The vacuum caused when the device is compressed allows drainage to collect in the device.     Wash your hands with soap and water before and after touching the system.  Empty the JP drain every 12 hours once you get home from your procedure.  Record the fluid amount on the record sheet included.  Start with stripping the drain tube to push the clots or excess fluid to the bulb.  Do this by pinching the tube with one hand near your skin.  Then with the other hand squeeze the tubing and work it toward the bulb.  This should be done several times a day.  This may collapse the  tube which will correct on its own.    Use a safety pin to attach your collection device to your clothing so there is no tension on the insertion site.    If you have drainage at the skin insertion site, you can apply a gauze dressing and secure it with tape.  If the drain falls out, apply a gauze dressing over the drain insertion site and secure with tape.   To empty the collection device:    Release the stopper on the top of the collection unit (bulb).   Pour contents into a measuring container such as a plastic medicine cup.   Record the day  and amount of drainage on the attached sheet.  This should be done at least twice a day.    To compress the Jackson-Pratt Bulb:   Release the stopper at the top of the bulb.  Squeeze the bulb tightly in your fist, squeezing air out of the bulb.   Replace the stopper while the bulb is compressed.   Be careful not to spill the contents when squeezing the bulb.  The drainage will start bright red and turn to pink and then yellow with time.  IMPORTANT: If the bulb is not squeezed before adding the stopper it will not draw out the fluid.  Care for the JP drain site and your skin daily:   You may shower three days after surgery.  Secure the drain to a ribbon or cloth around your waist while showering so it does not pull out while showering.  Be sure your hands are cleaned with soap and water.  Use a clean wet cotton swab to clean the skin around the drain site.   Use another cotton swab to place Vaseline or antibiotic ointment on the skin around the drain.     Contact your physician if any of the following occur:   The fluid in the bulb becomes cloudy.  Your temperature is greater than 101.4.   The incision opens.  If you have drainage at the skin insertion site, you can apply a gauze dressing and secure it with tape.  If the drain falls out, apply a gauze dressing over the drain insertion site and secure with tape.   You will  usually have more drainage when you are active than while you rest or are asleep. If the drainage increases significantly or is bloody call the physician                             Bring this record with you to each office visit Date  Drainage Volume  Date   Drainage volume                                                                                                                                                                                          General Anesthesia, Adult, Care After This sheet gives you information about how to care for yourself after your procedure. Your health care provider may also give you more specific instructions. If you have problems or questions, contact your health care provider. What can I expect after the procedure? After the procedure, the following side effects are common:  Pain or discomfort at the IV site.  Nausea.  Vomiting.  Sore throat.  Trouble concentrating.  Feeling cold or chills.  Feeling weak or tired.  Sleepiness and fatigue.  Soreness and body aches. These side effects can affect parts of the body that were not involved in surgery. Follow these instructions at home: For the time period you were told by your health care provider:  Rest.  Do not participate in activities where you could fall or become injured.  Do not drive or use machinery.  Do not drink alcohol.  Do not take sleeping pills or medicines that cause drowsiness.  Do not make important decisions or sign legal documents.  Do not take care of children on your own.   Eating and drinking  Follow any instructions from your health care provider about eating or drinking restrictions.  When you feel hungry, start by eating small amounts of foods that are soft and easy to digest (bland), such as toast. Gradually return to your regular diet.  Drink enough fluid to keep your urine pale yellow.  If you vomit, rehydrate by drinking  water, juice, or clear broth. General instructions  If you have sleep apnea, surgery and certain medicines can increase your risk for breathing problems. Follow instructions from your health care provider about wearing your sleep device: ? Anytime you are sleeping, including during daytime naps. ? While taking prescription pain medicines, sleeping medicines, or medicines that make you drowsy.  Have a responsible adult stay with you for the time you are told. It is important to have someone help care for you until you are awake and alert.  Return to your normal activities as told by your health care provider. Ask your health care provider what activities are safe for you.  Take over-the-counter and prescription medicines only as told by your health care provider.  If you smoke, do not smoke without supervision.  Keep all follow-up visits as told by your health care provider. This is important. Contact a health care provider if:  You have nausea or vomiting that does not get better with medicine.  You cannot eat or drink without vomiting.  You have pain that does not get better with medicine.  You are unable to pass urine.  You develop a skin rash.  You have a fever.  You have redness around your IV site that gets worse. Get help right away if:  You have difficulty breathing.  You have chest pain.  You have blood in your urine or stool, or you vomit blood. Summary  After the procedure, it is common to have a sore throat or nausea. It is also common to feel tired.  Have a responsible adult stay with you for the time you are told. It is important to have someone help care for you until you are awake and alert.  When you feel hungry, start by eating small amounts of foods that are soft and easy to digest (bland), such as toast. Gradually return to your regular diet.  Drink enough fluid to keep your urine pale yellow.  Return to your normal activities as told by your health  care provider. Ask your health care provider what activities are safe for you. This information is not intended to replace advice given to you by your health care provider. Make sure you discuss any questions you have with your health care provider. Document Revised: 07/11/2020 Document Reviewed: 02/08/2020 Elsevier Patient Education  2021 [employer].

## 2021-01-04 NOTE — Transfer of Care (Signed)
Immediate Anesthesia Transfer of Care Note  Patient: Jennifer Davis  Procedure(s) Performed: REMOVAL OF TISSUE EXPANDER AND PLACEMENT OF NEW EXPANDER (Right Breast)  Patient Location: PACU  Anesthesia Type:General  Level of Consciousness: awake, alert  and oriented  Airway & Oxygen Therapy: Patient Spontanous Breathing and Patient connected to nasal cannula oxygen  Post-op Assessment: Report given to RN, Post -op Vital signs reviewed and stable and Patient moving all extremities  Post vital signs: Reviewed and stable  Last Vitals:  Vitals Value Taken Time  BP 119/64 01/04/21 1313  Temp    Pulse 89 01/04/21 1316  Resp 6 01/04/21 1316  SpO2 100 % 01/04/21 1316  Vitals shown include unvalidated device data.  Last Pain:  Vitals:   01/04/21 1000  PainSc: 0-No pain      Patients Stated Pain Goal: 4 (79/48/01 6553)  Complications: No complications documented.

## 2021-01-04 NOTE — Progress Notes (Signed)
[place], SECOND RN JENNIFER CRUISE TO VERIFY.

## 2021-01-06 ENCOUNTER — Telehealth: Payer: Self-pay

## 2021-01-06 ENCOUNTER — Telehealth: Payer: Self-pay | Admitting: *Deleted

## 2021-01-06 ENCOUNTER — Encounter (HOSPITAL_COMMUNITY): Payer: Self-pay | Admitting: Plastic Surgery

## 2021-01-06 MED FILL — diazePAM 2 MG TABS: 2 | 15 days supply | Qty: 30 | Fill #0

## 2021-01-06 MED FILL — HYDROCODON-APAP 5-325: 5-325 | 5 days supply | Qty: 15 | Fill #0

## 2021-01-06 NOTE — Telephone Encounter (Signed)
Patient called to say that Dr. Marla Roe took care of her in the OR on Saturday and she is wondering if she needs to keep her appointment with Roetta Sessions, PA-C tomorrow.  Also, she is wondering if her return to work date needs to be adjusted since she had another surgery on Saturday.  Her current return to work date is 01/14/2021.  Please call.

## 2021-01-06 NOTE — Telephone Encounter (Signed)
Call returned to pt regarding her question about her f/u visit tomorrow. Pt had surgery with Dr. Marla Roe on 01/04/21. She reports that overall she is doing well.  The incisional pain is moderate- but she does c/o overall " body ache"- which she contributes to the surgical procedure/positioning & possibly from the infectious process. She denies any chills/fever & she has had no sign of bleeding/excessive swelling  or other complication. She has not removed the breast compression garment. She is eating/drinking & normal bowel/bladder function. She continues taking her antibiotics. She has discussed her return to work with Dr. Lindi Adie- who recommended that she apply for an " intermittent  FMLA" to provide the necessary time out of work as needed. I will forward this info to Dr. Marla Roe & Catalina Antigua, PA if she needs any assistance with this from our office. 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 Marla Roe & she recommends that pt keep her appointment here tomorrow. Pt agrees with the plan of care.

## 2021-01-06 NOTE — Telephone Encounter (Signed)
Received call from pt stating she went in for emergent surgery this weekend for right breast cellulitis and muscle expander replacement.  Pt states she currently on doxycycline for one more week.  Pt currently due to start A/C on 01/15/21.  Per MD pt needing to postpone starting tx until 01/22/21.  Scheduling notified and pt verbalized understanding.

## 2021-01-07 ENCOUNTER — Other Ambulatory Visit: Payer: Self-pay | Admitting: Surgical

## 2021-01-07 ENCOUNTER — Ambulatory Visit (INDEPENDENT_AMBULATORY_CARE_PROVIDER_SITE_OTHER): Payer: 59 | Admitting: Surgical

## 2021-01-07 ENCOUNTER — Other Ambulatory Visit: Payer: Self-pay

## 2021-01-07 ENCOUNTER — Encounter: Payer: Self-pay | Admitting: Surgical

## 2021-01-07 ENCOUNTER — Other Ambulatory Visit: Payer: Self-pay | Admitting: *Deleted

## 2021-01-07 ENCOUNTER — Encounter: Payer: Self-pay | Admitting: *Deleted

## 2021-01-07 VITALS — BP 125/77 | HR 105

## 2021-01-07 DIAGNOSIS — C50412 Malignant neoplasm of upper-outer quadrant of left female breast: Secondary | ICD-10-CM

## 2021-01-07 DIAGNOSIS — Z170 Estrogen receptor positive status [ER+]: Secondary | ICD-10-CM

## 2021-01-07 DIAGNOSIS — Z9013 Acquired absence of bilateral breasts and nipples: Secondary | ICD-10-CM

## 2021-01-07 MED ORDER — DOXYCYCLINE HYCLATE 100 MG PO TABS: 100.0000 mg | ORAL_TABLET | Freq: Two times a day (BID) | ORAL | 0 refills | Status: DC

## 2021-01-07 NOTE — Progress Notes (Signed)
Patient is a 44 year old female here for follow-up after undergoing bilateral mastectomies with left lymph node dissection by Dr. [name] followed by bilateral breast reconstruction with placement of tissue expanders and Flex HD with Dr. Marla Roe on 12/04/2020.  Patient subsequently returned to the operating room on 01/04/2021 after her right tissue expander became exposed.  On 01/04/2021 she underwent removal of the tissue expander and placement of a new expander with closure of the wound.  She was last seen on 12/31/2020 and was placed on doxycycline at that time.  Today she reports that overall she is doing much better.  She has minimal pain.  She reports she has had about 50 cc of output from her JP drain over the past 24 hours.  She is not having any infectious symptoms.  She does report some nausea, but this has been easily treatable with Zofran.  She is continuing to take doxycycline.  Chaperone present on exam On exam right breast incision with Steri-Strips and honeycomb dressing in place.  Right JP drain in place with serosanguineous drainage in bulb.  There is no tenderness to palpation.  No cellulitic changes noted.  There is some firmness to the breast tissue, suspect this is likely reactive due to the recent surgery.  I do not feel any fluctuance or areas of fluid collection.  She currently has the following volumes in her expanders: Right: 150/535 cc Left: 350/535 cc  We discussed holding off on additional fills until the right breast incision has had time to heal.  Recommend she continue with the doxycycline.  I will send her an additional prescription in case she does not have any left.  She was unsure.  I would like her to stay on the antibiotic until the right JP drain has been removed.  She has been tolerating it fine.  I do not see any cellulitic changes or signs of any concerning fluid today on exam.  I would like to see her back in 1 week for reevaluation, possibly remove the right  JP drain depending on the output.  Patient is understanding of this and all of her questions were answered.

## 2021-01-07 NOTE — Patient Outreach (Signed)
[facility]) Care Management  01/07/2021  DAVENE JOBIN 1976/01/11 [id number]   Transition of care call/case closure   Referral received:01/07/21 Initial outreach:01/07/21 Insurance: [employer]    Subjective: Initial successful telephone call to patient's preferred number in order to complete transition of care assessment; 2 HIPAA identifiers verified. Explained purpose of call and completed transition of care assessment.  Elaijah states that she is feeling great,  denies post-operative problems,she reports having appointment with plastic surgeon office on today. She reports surgical site without new signs of infection. She reports still wearing a binder and has drain in place that she is managing .She  states surgical pain well managed with prescribed medications, tolerating diet, denies bowel or bladder problems.  Spouse/children are assisting with her  recovery.   Reviewed accessing the following [place] Benefits :  She  uses a [facility] outpatient pharmacy at [facility], she reports accessing FMLA benefits.  .       Objective:  Mrs. Enedina Pair  was hospitalized at [facility] on 01/04/21 for Right breast cellulitis, removal of tissue expander and placement of new expander   Comorbidities include: Bilateral Mastectomies with bilateral breast reconstruction  on 12/04/20.  She was discharged to home on 01/04/21 without the need for home health services or DME.   Assessment:  Patient voices good understanding of all discharge instructions.  See transition of care flowsheet for assessment details.   Plan:  Reviewed hospital discharge diagnosis of Right breast cellulitis, removal of tissue expander and placement of new explander   and discharge treatment plan using hospital discharge instructions, assessing medication adherence, reviewing problems requiring provider notification, and discussing the importance of follow up with surgeon, primary care provider  and/or specialists as directed.  Reviewed  healthy lifestyle program information to receive discounted premium for  2023   Step 1: Get  your annual physical  Step 2: Complete your health assessment  Step 3:Identify your current health status and complete the corresponding action step between November 09, 2020 and July 10, 2021.    No ongoing care management needs identified so will close case to [place] Management services and route successful outreach letter with [place] Management pamphlet and 24 Hour Nurse Line Magnet to [place] Management clinical pool to be mailed to patient's home address.  Thanked patient for their services to [facility].   Joylene Draft, RN, BSN  [place] Management Coordinator  [phone]- Mobile [phone]- Toll Free Main Office

## 2021-01-10 ENCOUNTER — Ambulatory Visit: Payer: 59 | Admitting: Hematology and Oncology

## 2021-01-10 ENCOUNTER — Other Ambulatory Visit: Payer: 59

## 2021-01-11 MED FILL — DOXYCYCLINE HYCLATE 100 MG: 100 | 5 days supply | Qty: 10 | Fill #0

## 2021-01-14 ENCOUNTER — Encounter: Payer: Self-pay | Admitting: Plastic Surgery

## 2021-01-14 ENCOUNTER — Other Ambulatory Visit: Payer: Self-pay

## 2021-01-14 ENCOUNTER — Ambulatory Visit (INDEPENDENT_AMBULATORY_CARE_PROVIDER_SITE_OTHER): Payer: 59 | Admitting: Plastic Surgery

## 2021-01-14 VITALS — BP 119/82 | HR 108

## 2021-01-14 DIAGNOSIS — Z9013 Acquired absence of bilateral breasts and nipples: Secondary | ICD-10-CM

## 2021-01-14 NOTE — Progress Notes (Signed)
The patient is a 45 yrs old here for follow up on her breast reconstruction.  Overall she is doing well.  The drain output is minimal on the right but too high for removal today.  We can plan to remove the drain Friday. Continue with the antibiotics.   We placed injectable saline in the Expander using a sterile technique: Right: 50 cc for a total of 200 / 535 cc Left: 50 cc for a total of 350 / 535 cc

## 2021-01-15 ENCOUNTER — Encounter: Payer: Self-pay | Admitting: *Deleted

## 2021-01-15 ENCOUNTER — Other Ambulatory Visit: Payer: 59

## 2021-01-15 ENCOUNTER — Ambulatory Visit: Payer: 59 | Admitting: Hematology and Oncology

## 2021-01-15 ENCOUNTER — Ambulatory Visit: Payer: 59

## 2021-01-17 ENCOUNTER — Other Ambulatory Visit: Payer: Self-pay

## 2021-01-17 ENCOUNTER — Ambulatory Visit: Payer: 59 | Admitting: Surgical

## 2021-01-17 ENCOUNTER — Encounter: Payer: Self-pay | Admitting: Surgical

## 2021-01-17 ENCOUNTER — Ambulatory Visit: Payer: 59

## 2021-01-17 VITALS — BP 144/81 | HR 121

## 2021-01-17 DIAGNOSIS — C50412 Malignant neoplasm of upper-outer quadrant of left female breast: Secondary | ICD-10-CM

## 2021-01-17 DIAGNOSIS — Z9013 Acquired absence of bilateral breasts and nipples: Secondary | ICD-10-CM

## 2021-01-17 DIAGNOSIS — Z170 Estrogen receptor positive status [ER+]: Secondary | ICD-10-CM

## 2021-01-17 NOTE — Progress Notes (Signed)
Patient is a 45 year old female here for follow-up on her bilateral breast reconstruction.  She underwent removal of her right tissue expander and placement of a new tissue expander on 01/04/2021 with Dr. Marla Roe after she noticed an opening.  She is here for evaluation of the JP drain output and for possible removal of the JP drain.  She reports that she is overall doing well, she has her JP drain output measurements which have been between 25 to 50 cc per 24 hours.  She reports she is otherwise doing well.  She does report that she has been a little bit more active and doing some cleaning at home.  Chaperone present on exam On exam right breast incision is intact, honeycomb dressing in place with Steri-Strips also in place.  There is no erythema.  Some serosanguineous drainage in the right JP drain.  There is no erythema or foul odor is noted.  No fluid collection or subcutaneous fluid noted with palpation.  We opted not to remove the right JP drain today due to the output being higher than I would like for removal.  Recommend following up next week for reevaluation, possible removal of the drain.  She is still taking doxycycline, I discussed with her that if she is not having any side effects I would like her to continue this while the drain is in place.  I discussed the patient if she notices the drainage decreased over the weekend and she would like to be seen Monday or Tuesday would be happy to see her.  There is no sign of infection, seroma, hematoma.  We did not fill today, will allow her some time for recovery prior to doing additional fill. We did not injectable saline in the Expander using a sterile technique: Right: 0 cc for a total of 200 / 535 cc Left: 0 cc for a total of 350 / 535 cc

## 2021-01-21 NOTE — Assessment & Plan Note (Signed)
09/20/2019: Screening mammogram on 08/28/20 showed the stable bilateral masses, a new 0.9cm mass at the 1 o'clock position in the left breast, and one abnormal left axillary lymph node with 0.6cm cortical thickening. 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 MammaPrint: High risk: Luminal type B, 5-year metastasis free survival with chemo and hormone therapy: 93%, absolute benefit from chemo greater than 12%, average 10-year risk of recurrence untreated: 29%  Treatment plan: 1.Mastectomywith targeted node dissection 2.adjuvant chemotherapy with dose dense Adriamycin and Cytoxan followed by Taxol 3.Adjuvant radiation 4.Followed by adjuvant antiestrogen therapy ------------------------------------------------------------------------------------------------------------- Bilateral mastectomies with reconstruction [name]):  Right breast: no malignancy  Left breast: invasive and in situ ductal carcinoma, 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 -------------------------------------------------------------------------------------------------- Current Treatment: Cycle 1 DD Adriamycin and Cytoxan 12/30/20: ECHO EF 65-70% Labs reviewed RTC in 1 week for tox check

## 2021-01-21 NOTE — Progress Notes (Signed)
Patient Care Team: Wenda Low, MD as PCP - General (Internal Medicine) Rockwell Germany, RN as Oncology Nurse Navigator Mauro Kaufmann, RN as Oncology Nurse Navigator Coralie Keens, MD as Consulting Physician (General Surgery) Nicholas Lose, MD as Consulting Physician (Hematology and Oncology) Gery Pray, MD as Consulting Physician (Radiation Oncology) Gwyndolyn Kaufman, RN as Registered Nurse  DIAGNOSIS:    ICD-10-CM   1. Malignant neoplasm of upper-outer quadrant of left breast in female, estrogen receptor positive ([place])  C50.412    Z17.0     SUMMARY OF ONCOLOGIC HISTORY: Oncology History  Malignant neoplasm of upper-outer quadrant of left breast in female, estrogen receptor positive ([place])  09/19/2020 Initial Diagnosis   Mammogram in 07/2018 showed probably benign bilateral breast masses that were stable on her last mammogram on 07/26/19. Mammogram on 08/28/20 showed the stable bilateral masses, a new 0.9cm mass at the 1 o'clock position in the left breast, and one abnormal left axillary lymph node with 0.6cm cortical thickening. 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    10/02/2020 Genetic Testing   Negative genetic testing: no pathogenic variants detected in [facility] Common-Hereditary Cancers Panel.  Variants of uncertain significance detected in APC c.6918T>A (p.Asp2306Glu) and POLE  c.2612G>C (p.Ser871Thr).  The report date is November 24. 2021.   The Common Hereditary Cancers Panel offered by [facility] includes sequencing and/or deletion duplication testing of the following 48 genes: APC, ATM, AXIN2, BARD1, BMPR1A, BRCA1, BRCA2, BRIP1, CDH1, CDK4, CDKN2A (p14ARF), CDKN2A (p16INK4a), CHEK2, CTNNA1, DICER1, EPCAM (Deletion/duplication testing only), GREM1 (promoter region deletion/duplication testing only), KIT, MEN1, MLH1, MSH2, MSH3, MSH6, MUTYH, NBN, NF1, NHTL1, PALB2,  PDGFRA, PMS2, POLD1, POLE, PTEN, RAD50, RAD51C, RAD51D, RNF43, SDHB, SDHC, SDHD, SMAD4, SMARCA4. STK11, TP53, TSC1, TSC2, and VHL.  The following genes were evaluated for sequence changes only: SDHA and HOXB13 c.251G>A variant only.   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    12/04/2020 Cancer Staging   Staging form: Breast, AJCC 8th Edition - Pathologic stage from 12/04/2020: Stage IA (pT1c, pN1a, cM0, G2, ER+, PR+, HER2-) - Signed by [name], NP on 12/18/2020 Histologic grading system: 3 grade system   01/22/2021 -  Chemotherapy    Patient is on Treatment Plan: BREAST ADJUVANT DOSE DENSE AC Q14D / PACLITAXEL Q7D        CHIEF COMPLIANT: Cycle 1 Adriamycin and Cytoxan  INTERVAL HISTORY: Jennifer Davis is a 45 y.o. with above-mentioned history of left breastcancerwho underwent bilateral mastectomies with reconstruction and is currently on adjuvant chemotherapy with Adriamycin and Cytoxan. Echo on 12/30/20 showed an ejection fraction of 65-70%. She was on 2 weeks of doxycycline for right breast cellulitis, and underwent an expander replacement on 01/04/21, which postponed chemotherapy. She presents to the clinic todayfor treatment.  She is anxious today to get started on her treatment.  She is nervous about side effects.  She is feeling significantly better since the expander was removed.  ALLERGIES:  has No Known Allergies.  MEDICATIONS:  Current Outpatient Medications  Medication Sig Dispense Refill   acetaminophen (TYLENOL) 500 MG tablet Take 1 tablet (500 mg total) by mouth every 6 (six) hours as needed. For use AFTER surgery (Patient taking differently: Take 500 mg by  mouth every 6 (six) hours as needed for mild pain, fever or headache.) 30 tablet 0   busPIRone (BUSPAR) 30  MG tablet Take 1 tablet (30 mg total) by mouth 2 (two) times daily. 60 tablet 5   cetirizine (ZYRTEC) 10 MG tablet Take 10 mg by mouth daily.     dexamethasone (DECADRON) 4 MG tablet Take 1 tablet (4 mg total) by mouth daily. Take 1 tab day after chemo and 1 tab 2 days after chemo. Take with food. 8 tablet 0   diazePAM (VALIUM PO) Take 1 tablet by mouth as needed (muscle spasms).     DULoxetine (CYMBALTA) 60 MG capsule Take 2 capsules (120 mg total) by mouth daily. 60 capsule 5   EPINEPHrine (EPIPEN 2-PAK) 0.3 mg/0.3 mL IJ SOAJ injection Inject 0.3 mLs (0.3 mg total) into the muscle once as needed for up to 1 dose (for severe allergic reaction). CAll 911 immediately if you have to use this medicine 1 each 1   gabapentin (NEURONTIN) 100 MG capsule Take 1 capsule (100 mg total) by mouth 3 (three) times daily as needed. (Patient taking differently: Take 100 mg by mouth 3 (three) times daily as needed (neuropathy).) 90 capsule 1   ibuprofen (ADVIL) 600 MG tablet Take 1 tablet (600 mg total) by mouth every 6 (six) hours as needed for mild pain or moderate pain. For use AFTER surgery 30 tablet 0   lidocaine-prilocaine (EMLA) cream Apply to affected area once (Patient taking differently: Apply 1 application topically as needed (port access).) 30 g 3   LORazepam (ATIVAN) 0.5 MG tablet Take 0.5 mg by mouth at bedtime as needed (nausea).     melatonin 5 MG TABS Take 5 mg by mouth at bedtime as needed (sleep).     montelukast (SINGULAIR) 10 MG tablet Take 10 mg by mouth daily.  5   Multiple Vitamin (MULTIVITAMIN WITH MINERALS) TABS tablet Take 1 tablet by mouth daily.     Multiple Vitamins-Minerals (MULTIVITAMIN PO) Take 2 tablets by mouth daily. Skin/hair/nail supplement.     ondansetron (ZOFRAN) 8 MG tablet Take 1 tablet (8 mg total) by mouth 2 (two) times daily as needed. Start on the third day after chemotherapy. 30 tablet 1   ondansetron (ZOFRAN-ODT) 4 MG disintegrating tablet Take 1 tablet  (4 mg total) by mouth every 8 (eight) hours as needed for nausea or vomiting. 20 tablet 0   PROAIR HFA 108 (90 Base) MCG/ACT inhaler Inhale 2 puffs into the lungs as needed for wheezing or shortness of breath.  0   prochlorperazine (COMPAZINE) 10 MG tablet Take 1 tablet (10 mg total) by mouth every 6 (six) hours as needed (Nausea or vomiting). 30 tablet 1   SYMBICORT 160-4.5 MCG/ACT inhaler Inhale 2 puffs into the lungs 2 (two) times daily.  5   tamoxifen (NOLVADEX) 10 MG tablet Take 1 tablet (10 mg total) by mouth daily. 30 tablet 0   No current facility-administered medications for this visit.    PHYSICAL EXAMINATION: ECOG PERFORMANCE STATUS: 1 - Symptomatic but completely ambulatory  Vitals:   01/22/21 0818  BP: (!) 141/78  Pulse: (!) 116  Resp: 18  Temp: 97.7 F (36.5 C)  SpO2: 100%   Filed Weights   01/22/21 0818  Weight: 214 lb 3.2 oz (97.2 kg)     LABORATORY DATA:  I have reviewed the data as listed CMP Latest Ref Rng & Units 09/25/2020  Glucose 70 - 99 mg/dL 143(H)  BUN 6 - 20 mg/dL  8  Creatinine 0.44 - 1.00 mg/dL 0.82  Sodium 135 - 145 mmol/L 139  Potassium 3.5 - 5.1 mmol/L 3.7  Chloride 98 - 111 mmol/L 106  CO2 22 - 32 mmol/L 22  Calcium 8.9 - 10.3 mg/dL 8.7(L)  Total Protein 6.5 - 8.1 g/dL 7.0  Total Bilirubin 0.3 - 1.2 mg/dL 0.3  Alkaline Phos 38 - 126 U/L 79  AST 15 - 41 U/L 13(L)  ALT 0 - 44 U/L 13    Lab Results  Component Value Date   WBC 8.7 01/22/2021   HGB 9.9 (L) 01/22/2021   HCT 32.3 (L) 01/22/2021   MCV 83.9 01/22/2021   PLT 421 (H) 01/22/2021   NEUTROABS 5.4 01/22/2021    ASSESSMENT & PLAN:  Malignant neoplasm of upper-outer quadrant of left breast in female, estrogen receptor positive ([place]) 09/20/2019: Screening mammogram on 08/28/20 showed the stable bilateral masses, a new 0.9cm mass at the 1 o'clock position in the left breast, and one abnormal left axillary lymph node with 0.6cm cortical thickening. Biopsy showed invasive and in  situ ductal carcinoma in the breast and axilla, grade 2, HER-2 equivocal by IHC (2+), ER+ 90%, PR+ 60%, Ki67 25%. MammaPrint: High risk: Luminal type B, 5-year metastasis free survival with chemo and hormone therapy: 93%, absolute benefit from chemo greater than 12%, average 10-year risk of recurrence untreated: 29%  Treatment plan: 1.Mastectomywith targeted node dissection 2.adjuvant chemotherapy with dose dense Adriamycin and Cytoxan followed by Taxol 3.Adjuvant radiation 4.Followed by adjuvant antiestrogen therapy ------------------------------------------------------------------------------------------------------------- Bilateral mastectomies with reconstruction [name]):  Right breast: no malignancy  Left breast: invasive and in situ ductal carcinoma, 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 -------------------------------------------------------------------------------------------------- Current Treatment: Cycle 1 DD Adriamycin and Cytoxan 12/30/20: ECHO EF 65-70% Labs reviewed  Anemia: Due to blood loss from recent surgeries: We will be monitoring it closely. RTC in 1 week for tox check  No orders of the defined types were placed in this encounter.  The patient has a good understanding of the overall plan. she agrees with it. she will call with any problems that may develop before the next visit here.  Total time spent: 30 mins including face to face time and time spent for planning, charting and coordination of care  Rulon Eisenmenger, MD, MPH 01/22/2021  I, Molly Dorshimer, am acting as scribe for Dr. Nicholas Lose.  I have reviewed the above documentation for accuracy and completeness, and I agree with the above.

## 2021-01-22 ENCOUNTER — Inpatient Hospital Stay: Payer: 59 | Attending: Hematology and Oncology

## 2021-01-22 ENCOUNTER — Ambulatory Visit: Payer: 59 | Admitting: Hematology and Oncology

## 2021-01-22 ENCOUNTER — Encounter: Payer: Self-pay | Admitting: *Deleted

## 2021-01-22 ENCOUNTER — Other Ambulatory Visit: Payer: 59

## 2021-01-22 ENCOUNTER — Other Ambulatory Visit: Payer: Self-pay

## 2021-01-22 ENCOUNTER — Inpatient Hospital Stay: Payer: 59

## 2021-01-22 ENCOUNTER — Inpatient Hospital Stay (HOSPITAL_BASED_OUTPATIENT_CLINIC_OR_DEPARTMENT_OTHER): Payer: 59 | Admitting: Hematology and Oncology

## 2021-01-22 VITALS — HR 98

## 2021-01-22 DIAGNOSIS — C773 Secondary and unspecified malignant neoplasm of axilla and upper limb lymph nodes: Secondary | ICD-10-CM | POA: Insufficient documentation

## 2021-01-22 DIAGNOSIS — C50412 Malignant neoplasm of upper-outer quadrant of left female breast: Secondary | ICD-10-CM | POA: Insufficient documentation

## 2021-01-22 DIAGNOSIS — Z95828 Presence of other vascular implants and grafts: Secondary | ICD-10-CM

## 2021-01-22 DIAGNOSIS — Z170 Estrogen receptor positive status [ER+]: Secondary | ICD-10-CM | POA: Insufficient documentation

## 2021-01-22 DIAGNOSIS — Z5189 Encounter for other specified aftercare: Secondary | ICD-10-CM | POA: Insufficient documentation

## 2021-01-22 DIAGNOSIS — Z5111 Encounter for antineoplastic chemotherapy: Secondary | ICD-10-CM | POA: Insufficient documentation

## 2021-01-22 LAB — COMPREHENSIVE METABOLIC PANEL
ALT: 10 U/L (ref 0–44)
AST: 8 U/L — ABNORMAL LOW (ref 15–41)
Albumin: 3.1 g/dL — ABNORMAL LOW (ref 3.5–5.0)
Alkaline Phosphatase: 82 U/L (ref 38–126)
Anion gap: 9 (ref 5–15)
BUN: 9 mg/dL (ref 6–20)
CO2: 23 mmol/L (ref 22–32)
Calcium: 8.6 mg/dL — ABNORMAL LOW (ref 8.9–10.3)
Chloride: 106 mmol/L (ref 98–111)
Creatinine, Ser: 0.78 mg/dL (ref 0.44–1.00)
GFR, Estimated: >60 mL/min (ref >60)
Glucose, Bld: 160 mg/dL — ABNORMAL HIGH (ref 70–99)
Potassium: 3.2 mmol/L — ABNORMAL LOW (ref 3.5–5.1)
Sodium: 138 mmol/L (ref 135–145)
Total Bilirubin: 0.2 mg/dL — ABNORMAL LOW (ref 0.3–1.2)
Total Protein: 6.8 g/dL (ref 6.5–8.1)

## 2021-01-22 LAB — CBC WITH DIFFERENTIAL/PLATELET
Abs Immature Granulocytes: 0.03 10*3/uL (ref 0.00–0.07)
Basophils Absolute: 0 10*3/uL (ref 0.0–0.1)
Basophils Relative: 0 %
Eosinophils Absolute: 0.4 10*3/uL (ref 0.0–0.5)
Eosinophils Relative: 5 %
HCT: 32.3 % — ABNORMAL LOW (ref 36.0–46.0)
Hemoglobin: 9.9 g/dL — ABNORMAL LOW (ref 12.0–15.0)
Immature Granulocytes: 0 %
Lymphocytes Relative: 27 %
Lymphs Abs: 2.3 10*3/uL (ref 0.7–4.0)
MCH: 25.7 pg — ABNORMAL LOW (ref 26.0–34.0)
MCHC: 30.7 g/dL (ref 30.0–36.0)
MCV: 83.9 fL (ref 80.0–100.0)
Monocytes Absolute: 0.4 10*3/uL (ref 0.1–1.0)
Monocytes Relative: 5 %
Neutro Abs: 5.4 10*3/uL (ref 1.7–7.7)
Neutrophils Relative %: 63 %
Platelets: 421 10*3/uL — ABNORMAL HIGH (ref 150–400)
RBC: 3.85 MIL/uL — ABNORMAL LOW (ref 3.87–5.11)
RDW: 15 % (ref 11.5–15.5)
WBC: 8.7 10*3/uL (ref 4.0–10.5)
nRBC: 0 % (ref 0.0–0.2)

## 2021-01-22 LAB — PREGNANCY, URINE: Preg Test, Ur: NEGATIVE

## 2021-01-22 LAB — RESEARCH LABS

## 2021-01-22 MED ORDER — PALONOSETRON HCL INJECTION 0.25 MG/5ML
INTRAVENOUS | Status: AC
  Filled 2021-01-22: qty 5

## 2021-01-22 MED ORDER — DOXORUBICIN HCL CHEMO IV INJECTION 2 MG/ML
60.0000 mg/m2 | Freq: Once | INTRAVENOUS | Status: AC
  Administered 2021-01-22: 114 mg via INTRAVENOUS
  Filled 2021-01-22: qty 57

## 2021-01-22 MED ORDER — SODIUM CHLORIDE 0.9 % IV SOLN
Freq: Once | INTRAVENOUS | Status: AC
  Filled 2021-01-22: qty 250

## 2021-01-22 MED ORDER — SODIUM CHLORIDE 0.9 % IV SOLN
10.0000 mg | Freq: Once | INTRAVENOUS | Status: AC
  Administered 2021-01-22: 10 mg via INTRAVENOUS
  Filled 2021-01-22: qty 10

## 2021-01-22 MED ORDER — SODIUM CHLORIDE 0.9% FLUSH
10.0000 mL | Freq: Once | INTRAVENOUS | Status: AC
  Administered 2021-01-22: 10 mL
  Filled 2021-01-22: qty 10

## 2021-01-22 MED ORDER — SODIUM CHLORIDE 0.9 % IV SOLN
150.0000 mg | Freq: Once | INTRAVENOUS | Status: AC
  Administered 2021-01-22: 150 mg via INTRAVENOUS
  Filled 2021-01-22: qty 150

## 2021-01-22 MED ORDER — CYCLOPHOSPHAMIDE CHEMO INJECTION 1 GM
600.0000 mg/m2 | Freq: Once | INTRAMUSCULAR | Status: AC
  Administered 2021-01-22: 1140 mg via INTRAVENOUS
  Filled 2021-01-22: qty 57

## 2021-01-22 MED ORDER — HEPARIN SOD (PORK) LOCK FLUSH 100 UNIT/ML IV SOLN
500.0000 [IU] | Freq: Once | INTRAVENOUS | Status: AC | PRN
  Administered 2021-01-22: 500 [IU]
  Filled 2021-01-22: qty 5

## 2021-01-22 MED ORDER — PALONOSETRON HCL INJECTION 0.25 MG/5ML
0.2500 mg | Freq: Once | INTRAVENOUS | Status: AC
  Administered 2021-01-22: 0.25 mg via INTRAVENOUS

## 2021-01-22 MED ORDER — SODIUM CHLORIDE 0.9% FLUSH
10.0000 mL | INTRAVENOUS | Status: DC | PRN
  Administered 2021-01-22: 10 mL
  Filled 2021-01-22: qty 10

## 2021-01-22 NOTE — Research (Signed)
TRIAL - TREATMENT OF REFRACTORY NAUSEA [id number]     Patient arrives Unaccompanied to the cancer center to begin cycle 1. Initiation of cycle 1 has been delayed due to surgical complications but she plans to begin treatment today.   PROs: Required baseline PROs were previously completed online. Jennifer Davis states she has already received the cycle 1 questionnaires online as well. Paper copies of all cycle 1 forms are reviewed and explained and provided to [place] along with a postage-paid envelope for returning completed forms. She is reminded that I will be calling her to assess her nausea following this first cycle and she verbalizes understanding. Jennifer Davis is advised that she may complete forms online or on paper and that paper copies may be returned via the postage-paid envelope or she may bring them with her at her follow-up visit scheduled for 01/29/21: she verbalizes understanding.   LABS: Mandatory labs are collected per consent and study protocol via port. Urine pregnancy test is negative. Patient Jennifer Davis tolerated lab collection well without complaint.   MEDICATION REVIEW: Lexine Baton reviews the current med list and verifies as correct with no changes.  MD VISIT: Jennifer Davis sees Dr. Lindi Adie today.   INFUSION: Jennifer Davis completes her cycle 1 treatment today.  Jennifer Davis is thanked for her time and voluntary participation in this study. Patient Jennifer Davis has been provided direct contact information and is encouraged to contact this Nurse for any needs or questions. Current plan is to call Jennifer Davis on Monday, 01/27/2021, to assess nausea following today's treatment.  Dionne Bucy. Sharlett Iles, BSN, RN, CIC 01/22/2021 2:40 PM

## 2021-01-22 NOTE — Patient Instructions (Signed)
[facility] Discharge Instructions for Patients Receiving Chemotherapy  Today you received the following chemotherapy agents doxirubicin, cyclophosphamide  To help prevent nausea and vomiting after your treatment, we encourage you to take your nausea medication as directed.   If you develop nausea and vomiting that is not controlled by your nausea medication, call the clinic.   BELOW ARE SYMPTOMS THAT SHOULD BE REPORTED IMMEDIATELY:  *FEVER GREATER THAN 100.5 F  *CHILLS WITH OR WITHOUT FEVER  NAUSEA AND VOMITING THAT IS NOT CONTROLLED WITH YOUR NAUSEA MEDICATION  *UNUSUAL SHORTNESS OF BREATH  *UNUSUAL BRUISING OR BLEEDING  TENDERNESS IN MOUTH AND THROAT WITH OR WITHOUT PRESENCE OF ULCERS  *URINARY PROBLEMS  *BOWEL PROBLEMS  UNUSUAL RASH Items with * indicate a potential emergency and should be followed up as soon as possible.  Feel free to call the clinic should you have any questions or concerns. The clinic phone number is [phone].  Please show the [place] at check-in to the Emergency Department and triage nurse.

## 2021-01-23 ENCOUNTER — Telehealth: Payer: Self-pay | Admitting: Hematology and Oncology

## 2021-01-23 NOTE — Telephone Encounter (Signed)
Scheduled per 3/16 los. Pt will receive an updated appt calendar per next visit appt notes

## 2021-01-24 ENCOUNTER — Other Ambulatory Visit: Payer: Self-pay

## 2021-01-24 ENCOUNTER — Inpatient Hospital Stay: Payer: 59

## 2021-01-24 ENCOUNTER — Encounter: Payer: Self-pay | Admitting: Plastic Surgery

## 2021-01-24 ENCOUNTER — Ambulatory Visit (INDEPENDENT_AMBULATORY_CARE_PROVIDER_SITE_OTHER): Payer: 59 | Admitting: Surgical

## 2021-01-24 VITALS — BP 116/73 | HR 114

## 2021-01-24 DIAGNOSIS — C50412 Malignant neoplasm of upper-outer quadrant of left female breast: Secondary | ICD-10-CM

## 2021-01-24 DIAGNOSIS — C773 Secondary and unspecified malignant neoplasm of axilla and upper limb lymph nodes: Secondary | ICD-10-CM | POA: Diagnosis not present

## 2021-01-24 DIAGNOSIS — Z5189 Encounter for other specified aftercare: Secondary | ICD-10-CM | POA: Diagnosis not present

## 2021-01-24 DIAGNOSIS — Z9013 Acquired absence of bilateral breasts and nipples: Secondary | ICD-10-CM

## 2021-01-24 DIAGNOSIS — Z170 Estrogen receptor positive status [ER+]: Secondary | ICD-10-CM

## 2021-01-24 DIAGNOSIS — Z5111 Encounter for antineoplastic chemotherapy: Secondary | ICD-10-CM | POA: Diagnosis not present

## 2021-01-24 MED ORDER — PEGFILGRASTIM-BMEZ 6 MG/0.6ML ~~LOC~~ SOSY
PREFILLED_SYRINGE | SUBCUTANEOUS | Status: AC
  Filled 2021-01-24: qty 0.6

## 2021-01-24 MED ORDER — PEGFILGRASTIM-BMEZ 6 MG/0.6ML ~~LOC~~ SOSY
6.0000 mg | PREFILLED_SYRINGE | Freq: Once | SUBCUTANEOUS | Status: AC
  Administered 2021-01-24: 6 mg via SUBCUTANEOUS

## 2021-01-24 NOTE — Patient Instructions (Signed)
Pegfilgrastim injection What is this medicine? PEGFILGRASTIM (PEG fil gra stim) is a long-acting granulocyte colony-stimulating factor that stimulates the growth of neutrophils, a type of white blood cell important in the body's fight against infection. It is used to reduce the incidence of fever and infection in patients with certain types of cancer who are receiving chemotherapy that affects the bone marrow, and to increase survival after being exposed to high doses of radiation. This medicine may be used for other purposes; ask your health care provider or pharmacist if you have questions. COMMON BRAND NAME(S): Fulphila, Neulasta, Nyvepria, UDENYCA, Ziextenzo What should I tell my health care provider before I take this medicine? They need to know if you have any of these conditions:  kidney disease  latex allergy  ongoing radiation therapy  sickle cell disease  skin reactions to acrylic adhesives (On-Body Injector only)  an unusual or allergic reaction to pegfilgrastim, filgrastim, other medicines, foods, dyes, or preservatives  pregnant or trying to get pregnant  breast-feeding How should I use this medicine? This medicine is for injection under the skin. If you get this medicine at home, you will be taught how to prepare and give the pre-filled syringe or how to use the On-body Injector. Refer to the patient Instructions for Use for detailed instructions. Use exactly as directed. Tell your healthcare provider immediately if you suspect that the On-body Injector may not have performed as intended or if you suspect the use of the On-body Injector resulted in a missed or partial dose. It is important that you put your used needles and syringes in a special sharps container. Do not put them in a trash can. If you do not have a sharps container, call your pharmacist or healthcare provider to get one. Talk to your pediatrician regarding the use of this medicine in children. While this drug  may be prescribed for selected conditions, precautions do apply. Overdosage: If you think you have taken too much of this medicine contact a poison control center or emergency room at once. NOTE: This medicine is only for you. Do not share this medicine with others. What if I miss a dose? It is important not to miss your dose. Call your doctor or health care professional if you miss your dose. If you miss a dose due to an On-body Injector failure or leakage, a new dose should be administered as soon as possible using a single prefilled syringe for manual use. What may interact with this medicine? Interactions have not been studied. This list may not describe all possible interactions. Give your health care provider a list of all the medicines, herbs, non-prescription drugs, or dietary supplements you use. Also tell them if you smoke, drink alcohol, or use illegal drugs. Some items may interact with your medicine. What should I watch for while using this medicine? Your condition will be monitored carefully while you are receiving this medicine. You may need blood work done while you are taking this medicine. Talk to your health care provider about your risk of cancer. You may be more at risk for certain types of cancer if you take this medicine. If you are going to need a MRI, CT scan, or other procedure, tell your doctor that you are using this medicine (On-Body Injector only). What side effects may I notice from receiving this medicine? Side effects that you should report to your doctor or health care professional as soon as possible:  allergic reactions (skin rash, itching or hives, swelling of   the face, lips, or tongue)  back pain  dizziness  fever  pain, redness, or irritation at site where injected  pinpoint red spots on the skin  red or dark-brown urine  shortness of breath or breathing problems  stomach or side pain, or pain at the shoulder  swelling  tiredness  trouble  passing urine or change in the amount of urine  unusual bruising or bleeding Side effects that usually do not require medical attention (report to your doctor or health care professional if they continue or are bothersome):  bone pain  muscle pain This list may not describe all possible side effects. Call your doctor for medical advice about side effects. You may report side effects to FDA at 1-800-FDA-1088. Where should I keep my medicine? Keep out of the reach of children. If you are using this medicine at home, you will be instructed on how to store it. Throw away any unused medicine after the expiration date on the label. NOTE: This sheet is a summary. It may not cover all possible information. If you have questions about this medicine, talk to your doctor, pharmacist, or health care provider.  2021 Elsevier/Gold Standard (2019-11-17 13:20:51)  

## 2021-01-24 NOTE — Progress Notes (Signed)
Patient is a 45 year old female here for follow-up on her bilateral breast reconstruction.  She unfortunately had the right tissue expander removed recently and replaced with a new tissue expander on 01/04/2021.  She is here for reevaluation of her drain output.  Over the past 48 hours it has only put out 25 cc of serosanguineous fluid.  She is doing really well, she is much more comfortable at this point.  Chaperone present on exam On exam bilateral breast incisions are intact, bilateral sacrifice of viable with good color.  No erythema or cellulitic changes are noted.  There is no wounds noted.  No fluid wave noted on exam.  We remove the right JP drain, patient tolerated this fine. We placed injectable saline in the Expander using a sterile technique: Right: 50 cc for a total of 250 / 535 cc Left: 0 cc for a total of 350 / 535 cc  We are slowly injecting the right breast expander to match the left side.  Recommend following up in 2 weeks for reevaluation and for additional fill.  Recommend calling with any questions or concerns.  No sign of infection, seroma, hematoma.

## 2021-01-27 ENCOUNTER — Telehealth: Payer: Self-pay

## 2021-01-27 DIAGNOSIS — C50412 Malignant neoplasm of upper-outer quadrant of left female breast: Secondary | ICD-10-CM

## 2021-01-27 NOTE — Telephone Encounter (Signed)
Trial:  TESTING THE ADDITION OF TWO APPROVED DRUGS FOR PERSISTENT NAUSEA [facility] [address])  01/27/2021 11:09AM  OUTGOING CALL: Outgoing call to [name] as a followup to cycle 1 treatment (as required per protocol). Message left for Ardelle Park to call at her convenience and reminded to complete study questionnaires, either on paper or via email. Direct call back number provided.  Currently awaiting call back. If no return call today, will plan to call again tomorrow. Ardelle Park has a returned visit scheduled for Wednesday, 01/29/2021.     Dionne Bucy. Sharlett Iles, BSN, RN, CIC 01/27/2021 11:10 AM

## 2021-01-27 NOTE — Telephone Encounter (Signed)
Trial:  TESTING THE ADDITION OF TWO APPROVED DRUGS FOR PERSISTENT NAUSEA [facility] [address])  01/27/2021 12:29PM  INCOMING CALL: Incoming call from [name]: I verified that I was speaking with the correct person. Niki and I spoke for approximately 15 minutes. Jennifer Davis reports that she has been keeping up with her nausea on her 4-day diary as instructed and did experience nausea >3; Jennifer Davis states she has had no vomiting. With a nausea score > 3, she is potentially eligible for cycle 2 randomization. During chart review it was noted that a new prescription for diazepam and lorazepam both were prescribed on 01/22/21 and Jennifer Davis confirms that those medications were prescribed but she denies taking either of those medications. These medications were not on her original medication list and she is advised that she cannot take either of those prescriptions while receiving study meds for cycle 2: she verbalizes understanding. Jennifer Davis consented to online form completion and states she did receive an email prior to her originally scheduled cycle 1 start date (originally 01/01/21) but if she is unable to find the email, she will complete the paper forms and return them during her scheduled visit this Wednesday, 01/29/21. Jennifer Davis is thanked for her time and continued participation in the study and is encouraged to contact me directly with any needs or questions: she verbalizes understanding.   Dionne Bucy. Sharlett Iles, BSN, RN, CIC 01/27/2021 12:45 PM

## 2021-01-28 NOTE — Assessment & Plan Note (Signed)
09/20/2019: Screening mammogram on 08/28/20 showed the stable bilateral masses, a new 0.9cm mass at the 1 o'clock position in the left breast, and one abnormal left axillary lymph node with 0.6cm cortical thickening. 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 MammaPrint: High risk: Luminal type B, 5-year metastasis free survival with chemo and hormone therapy: 93%, absolute benefit from chemo greater than 12%, average 10-year risk of recurrence untreated: 29%  Treatment plan: 1.Mastectomywith targeted node dissection 2.adjuvant chemotherapy with dose dense Adriamycin and Cytoxan followed by Taxol 3.Adjuvant radiation 4.Followed by adjuvant antiestrogen therapy ------------------------------------------------------------------------------------------------------------- Bilateral mastectomies with reconstruction [name]):  Right breast: no malignancy  Left breast: invasive and in situ ductal carcinoma, 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 -------------------------------------------------------------------------------------------------- Current Treatment: Cycle 1 day 8 DD Adriamycin and Cytoxan 12/30/20: ECHO EF 65-70% Labs reviewed  Chemo Toxicities:   Anemia: Due to blood loss from recent surgeries: We will be monitoring it closely. RTC in 1 week for cycle 2

## 2021-01-28 NOTE — Telephone Encounter (Signed)
Trial:  TESTING THE ADDITION OF TWO APPROVED DRUGS FOR PERSISTENT NAUSEA [facility] [address])  01/28/2021 1:05PM  OUTGOING CALL: Outgoing call to [name]: I verified that I was speaking with the correct person and we spoke for approximately five minutes. Ardelle Park is reminded to be NPO for labs tomorrow and to bring her completed study diaries with her to her visit: she verbalizes understanding. Ardelle Park is thanked for her time and continued participation in the study and is encouraged to contact me directly for needs: she verbalizes appreciation and understanding.  The current plan is to follow up with [facility] tomorrow during her scheduled provider visit.   Dionne Bucy. Sharlett Iles, BSN, RN, CIC 01/28/2021 1:12 PM

## 2021-01-28 NOTE — Progress Notes (Signed)
Patient Care Team: Wenda Low, MD as PCP - General (Internal Medicine) Rockwell Germany, RN as Oncology Nurse Navigator Mauro Kaufmann, RN as Oncology Nurse Navigator Coralie Keens, MD as Consulting Physician (General Surgery) Nicholas Lose, MD as Consulting Physician (Hematology and Oncology) Gery Pray, MD as Consulting Physician (Radiation Oncology) Gwyndolyn Kaufman, RN as Registered Nurse  DIAGNOSIS:    ICD-10-CM   1. Other fatigue  R53.83 Iron and TIBC    Ferritin    Vitamin B12    Folate, Serum  2. Malignant neoplasm of upper-outer quadrant of left breast in female, estrogen receptor positive ([place])  C50.412    Z17.0     SUMMARY OF ONCOLOGIC HISTORY: Oncology History  Malignant neoplasm of upper-outer quadrant of left breast in female, estrogen receptor positive ([place])  09/19/2020 Initial Diagnosis   Mammogram in 07/2018 showed probably benign bilateral breast masses that were stable on her last mammogram on 07/26/19. Mammogram on 08/28/20 showed the stable bilateral masses, a new 0.9cm mass at the 1 o'clock position in the left breast, and one abnormal left axillary lymph node with 0.6cm cortical thickening. 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    10/02/2020 Genetic Testing   Negative genetic testing: no pathogenic variants detected in [facility] Common-Hereditary Cancers Panel.  Variants of uncertain significance detected in APC c.6918T>A (p.Asp2306Glu) and POLE  c.2612G>C (p.Ser871Thr).  The report date is November 24. 2021.   The Common Hereditary Cancers Panel offered by [facility] includes sequencing and/or deletion duplication testing of the following 48 genes: APC, ATM, AXIN2, BARD1, BMPR1A, BRCA1, BRCA2, BRIP1, CDH1, CDK4, CDKN2A (p14ARF), CDKN2A (p16INK4a), CHEK2, CTNNA1, DICER1, EPCAM (Deletion/duplication testing only), GREM1 (promoter region  deletion/duplication testing only), KIT, MEN1, MLH1, MSH2, MSH3, MSH6, MUTYH, NBN, NF1, NHTL1, PALB2, PDGFRA, PMS2, POLD1, POLE, PTEN, RAD50, RAD51C, RAD51D, RNF43, SDHB, SDHC, SDHD, SMAD4, SMARCA4. STK11, TP53, TSC1, TSC2, and VHL.  The following genes were evaluated for sequence changes only: SDHA and HOXB13 c.251G>A variant only.   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    12/04/2020 Cancer Staging   Staging form: Breast, AJCC 8th Edition - Pathologic stage from 12/04/2020: Stage IA (pT1c, pN1a, cM0, G2, ER+, PR+, HER2-) - Signed by [name], NP on 12/18/2020 Histologic grading system: 3 grade system   01/22/2021 -  Chemotherapy    Patient is on Treatment Plan: BREAST ADJUVANT DOSE DENSE AC Q14D / PACLITAXEL Q7D        CHIEF COMPLIANT: Cycle 1 Day 8 Adriamycin and Cytoxan  INTERVAL HISTORY: NELANI SCHMELZLE is a 45 y.o. with above-mentioned history of left breastcancerwhounderwentbilateral mastectomies with reconstruction and is currently on adjuvant chemotherapy with Adriamycin and Cytoxan. She presents to the clinic todayfor a toxicity check following cycle 1.    She complained of nausea almost on a daily basis over the past week.  She took Compazine which is helped her.  Zofran did not help her.  She did not have any emesis.  She also felt tired for several days.  ALLERGIES:  has No Known Allergies.  MEDICATIONS:  Current Outpatient Medications  Medication Sig Dispense Refill  . acetaminophen (TYLENOL) 500 MG tablet Take 1 tablet (500 mg total) by mouth every 6 (six)  hours as needed. For use AFTER surgery (Patient taking differently: Take 500 mg by mouth every 6 (six) hours as needed for mild pain, fever or headache.) 30 tablet 0  . busPIRone (BUSPAR) 30 MG tablet Take 1  tablet (30 mg total) by mouth 2 (two) times daily. 60 tablet 5  . cetirizine (ZYRTEC) 10 MG tablet Take 10 mg by mouth daily.    Marland Kitchen dexamethasone (DECADRON) 4 MG tablet Take 1 tablet (4 mg total) by mouth daily. Take 1 tab day after chemo and 1 tab 2 days after chemo. Take with food. 8 tablet 0  . diazePAM (VALIUM PO) Take 1 tablet by mouth as needed (muscle spasms).    . DULoxetine (CYMBALTA) 60 MG capsule Take 2 capsules (120 mg total) by mouth daily. 60 capsule 5  . EPINEPHrine (EPIPEN 2-PAK) 0.3 mg/0.3 mL IJ SOAJ injection Inject 0.3 mLs (0.3 mg total) into the muscle once as needed for up to 1 dose (for severe allergic reaction). CAll 911 immediately if you have to use this medicine 1 each 1  . gabapentin (NEURONTIN) 100 MG capsule Take 1 capsule (100 mg total) by mouth 3 (three) times daily as needed. (Patient taking differently: Take 100 mg by mouth 3 (three) times daily as needed (neuropathy).) 90 capsule 1  . ibuprofen (ADVIL) 600 MG tablet Take 1 tablet (600 mg total) by mouth every 6 (six) hours as needed for mild pain or moderate pain. For use AFTER surgery 30 tablet 0  . lidocaine-prilocaine (EMLA) cream Apply to affected area once (Patient taking differently: Apply 1 application topically as needed (port access).) 30 g 3  . LORazepam (ATIVAN) 0.5 MG tablet Take 0.5 mg by mouth at bedtime as needed (nausea).    . melatonin 5 MG TABS Take 5 mg by mouth at bedtime as needed (sleep).    . montelukast (SINGULAIR) 10 MG tablet Take 10 mg by mouth daily.  5  . Multiple Vitamin (MULTIVITAMIN WITH MINERALS) TABS tablet Take 1 tablet by mouth daily.    . Multiple Vitamins-Minerals (MULTIVITAMIN PO) Take 2 tablets by mouth daily. Skin/hair/nail supplement.    . ondansetron (ZOFRAN) 8 MG tablet Take 1 tablet (8 mg total) by mouth 2 (two) times daily as needed. Start on the third day after chemotherapy. (Patient not taking: Reported on 01/24/2021) 30 tablet 1  . ondansetron (ZOFRAN-ODT) 4 MG  disintegrating tablet Take 1 tablet (4 mg total) by mouth every 8 (eight) hours as needed for nausea or vomiting. (Patient not taking: Reported on 01/24/2021) 20 tablet 0  . PROAIR HFA 108 (90 Base) MCG/ACT inhaler Inhale 2 puffs into the lungs as needed for wheezing or shortness of breath.  0  . prochlorperazine (COMPAZINE) 10 MG tablet Take 1 tablet (10 mg total) by mouth every 6 (six) hours as needed (Nausea or vomiting). 30 tablet 1  . SYMBICORT 160-4.5 MCG/ACT inhaler Inhale 2 puffs into the lungs 2 (two) times daily.  5  . tamoxifen (NOLVADEX) 10 MG tablet Take 1 tablet (10 mg total) by mouth daily. 30 tablet 0   No current facility-administered medications for this visit.    PHYSICAL EXAMINATION: ECOG PERFORMANCE STATUS: 1 - Symptomatic but completely ambulatory  Vitals:   01/29/21 1135  BP: 127/71  Pulse: 94  Resp: 20  Temp: 97.7 F (36.5 C)  SpO2: 100%   Filed Weights   01/29/21 1135  Weight: 213 lb 9.6 oz (96.9 kg)     LABORATORY DATA:  I have reviewed  the data as listed CMP Latest Ref Rng & Units 01/29/2021 01/22/2021 09/25/2020  Glucose 70 - 99 mg/dL 104(H) 160(H) 143(H)  BUN 6 - 20 mg/dL '7 9 8  ' Creatinine 0.44 - 1.00 mg/dL 0.70 0.78 0.82  Sodium 135 - 145 mmol/L 139 138 139  Potassium 3.5 - 5.1 mmol/L 4.0 3.2(L) 3.7  Chloride 98 - 111 mmol/L 103 106 106  CO2 22 - 32 mmol/L '24 23 22  ' Calcium 8.9 - 10.3 mg/dL 8.5(L) 8.6(L) 8.7(L)  Total Protein 6.5 - 8.1 g/dL 6.6 6.8 7.0  Total Bilirubin 0.3 - 1.2 mg/dL 0.3 0.2(L) 0.3  Alkaline Phos 38 - 126 U/L 100 82 79  AST 15 - 41 U/L 10(L) 8(L) 13(L)  ALT 0 - 44 U/L '15 10 13    ' Lab Results  Component Value Date   WBC 3.2 (L) 01/29/2021   HGB 9.4 (L) 01/29/2021   HCT 30.3 (L) 01/29/2021   MCV 81.2 01/29/2021   PLT 258 01/29/2021   NEUTROABS 1.7 01/29/2021    ASSESSMENT & PLAN:  Malignant neoplasm of upper-outer quadrant of left breast in female, estrogen receptor positive ([place]) 09/20/2019: Screening mammogram on  08/28/20 showed the stable bilateral masses, a new 0.9cm mass at the 1 o'clock position in the left breast, and one abnormal left axillary lymph node with 0.6cm cortical thickening. 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 MammaPrint: High risk: Luminal type B, 5-year metastasis free survival with chemo and hormone therapy: 93%, absolute benefit from chemo greater than 12%, average 10-year risk of recurrence untreated: 29%  Treatment plan: 1.Mastectomywith targeted node dissection 2.adjuvant chemotherapy with dose dense Adriamycin and Cytoxan followed by Taxol 3.Adjuvant radiation 4.Followed by adjuvant antiestrogen therapy ------------------------------------------------------------------------------------------------------------- Bilateral mastectomies with reconstruction [name]):  Right breast: no malignancy  Left breast: invasive and in situ ductal carcinoma, 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 -------------------------------------------------------------------------------------------------- Current Treatment: Cycle 1 day 8 DD Adriamycin and Cytoxan 12/30/20: ECHO EF 65-70% Labs reviewed  Chemo Toxicities: 1.  Fatigue 2. Nausea 3.  Anemia: Hemoglobin 9.4  Her blood sugars today are 104.  She is not diabetic.  Anemia: Patient will start taking oral iron therapy. RTC in 1 week for cycle 2    Orders Placed This Encounter  Procedures  . Iron and TIBC    Standing Status:   Future    Standing Expiration Date:   01/29/2022  . Ferritin    Standing Status:   Future    Standing Expiration Date:   01/29/2022  . Vitamin B12    Standing Status:   Future    Standing Expiration Date:   01/29/2022  . Folate, Serum    Standing Status:   Future    Standing Expiration Date:   01/29/2022   The patient has a good understanding of  the overall plan. she agrees with it. she will call with any problems that may develop before the next visit here.  Total time spent: 30 mins including face to face time and time spent for planning, charting and coordination of care  Rulon Eisenmenger, MD, MPH 01/29/2021  I, Molly Dorshimer, am acting as scribe for Dr. Nicholas Lose.  I have reviewed the above documentation for accuracy and completeness, and I agree with the above.

## 2021-01-29 ENCOUNTER — Ambulatory Visit: Payer: 59

## 2021-01-29 ENCOUNTER — Other Ambulatory Visit: Payer: 59

## 2021-01-29 ENCOUNTER — Inpatient Hospital Stay (HOSPITAL_BASED_OUTPATIENT_CLINIC_OR_DEPARTMENT_OTHER): Payer: 59 | Admitting: Hematology and Oncology

## 2021-01-29 ENCOUNTER — Other Ambulatory Visit: Payer: Self-pay

## 2021-01-29 ENCOUNTER — Inpatient Hospital Stay: Payer: 59

## 2021-01-29 ENCOUNTER — Ambulatory Visit: Payer: 59 | Admitting: Hematology and Oncology

## 2021-01-29 ENCOUNTER — Encounter: Payer: Self-pay | Admitting: *Deleted

## 2021-01-29 VITALS — BP 127/71 | HR 94 | Temp 97.7°F | Resp 20 | Ht 59.5 in | Wt 213.6 lb

## 2021-01-29 DIAGNOSIS — Z5111 Encounter for antineoplastic chemotherapy: Secondary | ICD-10-CM | POA: Diagnosis not present

## 2021-01-29 DIAGNOSIS — C50412 Malignant neoplasm of upper-outer quadrant of left female breast: Secondary | ICD-10-CM

## 2021-01-29 DIAGNOSIS — Z5189 Encounter for other specified aftercare: Secondary | ICD-10-CM | POA: Diagnosis not present

## 2021-01-29 DIAGNOSIS — C773 Secondary and unspecified malignant neoplasm of axilla and upper limb lymph nodes: Secondary | ICD-10-CM | POA: Diagnosis not present

## 2021-01-29 DIAGNOSIS — Z95828 Presence of other vascular implants and grafts: Secondary | ICD-10-CM

## 2021-01-29 DIAGNOSIS — Z170 Estrogen receptor positive status [ER+]: Secondary | ICD-10-CM

## 2021-01-29 DIAGNOSIS — R5383 Other fatigue: Secondary | ICD-10-CM | POA: Diagnosis not present

## 2021-01-29 LAB — COMPREHENSIVE METABOLIC PANEL WITH GFR
ALT: 15 U/L (ref 0–44)
AST: 10 U/L — ABNORMAL LOW (ref 15–41)
Albumin: 3.3 g/dL — ABNORMAL LOW (ref 3.5–5.0)
Alkaline Phosphatase: 100 U/L (ref 38–126)
Anion gap: 12 (ref 5–15)
BUN: 7 mg/dL (ref 6–20)
CO2: 24 mmol/L (ref 22–32)
Calcium: 8.5 mg/dL — ABNORMAL LOW (ref 8.9–10.3)
Chloride: 103 mmol/L (ref 98–111)
Creatinine, Ser: 0.7 mg/dL (ref 0.44–1.00)
GFR, Estimated: >60 mL/min
Glucose, Bld: 104 mg/dL — ABNORMAL HIGH (ref 70–99)
Potassium: 4 mmol/L (ref 3.5–5.1)
Sodium: 139 mmol/L (ref 135–145)
Total Bilirubin: 0.3 mg/dL (ref 0.3–1.2)
Total Protein: 6.6 g/dL (ref 6.5–8.1)

## 2021-01-29 LAB — CBC WITH DIFFERENTIAL/PLATELET
Abs Immature Granulocytes: 0.07 K/uL (ref 0.00–0.07)
Basophils Absolute: 0 K/uL (ref 0.0–0.1)
Basophils Relative: 1 %
Eosinophils Absolute: 0.2 K/uL (ref 0.0–0.5)
Eosinophils Relative: 7 %
HCT: 30.3 % — ABNORMAL LOW (ref 36.0–46.0)
Hemoglobin: 9.4 g/dL — ABNORMAL LOW (ref 12.0–15.0)
Immature Granulocytes: 2 %
Lymphocytes Relative: 32 %
Lymphs Abs: 1 K/uL (ref 0.7–4.0)
MCH: 25.2 pg — ABNORMAL LOW (ref 26.0–34.0)
MCHC: 31 g/dL (ref 30.0–36.0)
MCV: 81.2 fL (ref 80.0–100.0)
Monocytes Absolute: 0.1 K/uL (ref 0.1–1.0)
Monocytes Relative: 4 %
Neutro Abs: 1.7 K/uL (ref 1.7–7.7)
Neutrophils Relative %: 54 %
Platelets: 258 K/uL (ref 150–400)
RBC: 3.73 MIL/uL — ABNORMAL LOW (ref 3.87–5.11)
RDW: 14.7 % (ref 11.5–15.5)
WBC: 3.2 K/uL — ABNORMAL LOW (ref 4.0–10.5)
nRBC: 0 % (ref 0.0–0.2)

## 2021-01-29 MED ORDER — SODIUM CHLORIDE 0.9% FLUSH
10.0000 mL | Freq: Once | INTRAVENOUS | Status: AC
  Administered 2021-01-29: 10 mL
  Filled 2021-01-29: qty 10

## 2021-01-29 MED ORDER — HEPARIN SOD (PORK) LOCK FLUSH 100 UNIT/ML IV SOLN
500.0000 [IU] | Freq: Once | INTRAVENOUS | Status: AC
Start: 2021-01-29 — End: 2021-01-29
  Administered 2021-01-29: 500 [IU]
  Filled 2021-01-29: qty 5

## 2021-01-29 NOTE — Research (Signed)
TRIAL - TREATMENT OF REFRACTORY NAUSEA [facility] [id number]; Cycle 2:       This clinical research nurse has reviewed this patient's inclusion and exclusion criteria and confirmed Jennifer Davis is eligible for study participation.  Patient will continue with enrollment to cycle 2 of study. Foye Spurling, BSN, RN Clinical Research Nurse 01/29/2021

## 2021-01-29 NOTE — Research (Unsigned)
TRIAL - TREATMENT OF REFRACTORY NAUSEA [id number]     Patient Jennifer Davis is randomized to Arm B.  Stratification criteria were confirmed by this clinical research Nurse and clinical research Nurse  Foye Spurling verified the stratification factors used for randomization.   As part of the assigned treatment arm, patient Jennifer Davis will receive (blinded to research staff) treatment. MD is also blinded to treatment arm (pharmacy is unblinded). Cycle 2 treatment will begin 02/05/2021.  Per study protocol, patient Jennifer Davis will not be notified of the treatment assignment. Patient Jennifer Davis is successfully enrolled in the above study.  Dionne Bucy. Sharlett Iles, BSN, RN, CIC 01/29/2021 2:09 PM

## 2021-01-29 NOTE — Research (Signed)
TRIAL - TREATMENT OF REFRACTORY NAUSEA [id number]     Patient arrives accompanied by her sister today for cycle 1 follow-up labs and visit.   STUDY DIARIES: Jennifer Davis returns all of her required cycle 1 nausea assessments. Diaries are reviewed and checked for completeness. Since Jennifer Davis reports several episodes of nausea graded a 3 or greater, she will be randomized to the cycle 2 portion of the study. She denies vomiting. This Nurse has reviewed this patient's inclusion and exclusion criteria and confirmed Jennifer Davis is eligible for study participation.  Patient will continue with randomization.  LABS: Routine labs are collected and Jennifer Davis reports she is fasting for today's labs. Jennifer Davis indicated that in the past she is not always NPO for labs so today's fasting glucose will be more indicative of her baseline. She tolerated lab collection well without complaint.   MEDICATION REVIEW: Jennifer Davis reviews the current med list and verifies as correct with no changes. Jennifer Davis verbalizes awareness that she cannot take diazepam or lorazepam while taking study medication. She reports taking prochlorperazine x3 in the past week since cycle 1 treatment.  MD VISIT: Jennifer Davis sees Dr. Lindi Adie today who reviews her labs and confirms she is not diabetic or uncontrolled hyperglycemic.   Jennifer Davis is thanked for her time and continued voluntary participation in this study. Patient Jennifer Davis has been provided direct contact information and is encouraged to contact this Nurse for any needs or questions. Current plan is to obtain 2nd review of eligibility for cycle 2 randomization, randomize to treatment arm, and notify the study coordinator for drug order. Jennifer Davis's cycle 2 treatment is currently scheduled for next Wednesday, 02/05/2021, and I will see Jennifer Davis at that appointment.   Dionne Bucy. Sharlett Iles, BSN, RN, CIC 01/29/2021 12:57 PM

## 2021-01-31 ENCOUNTER — Ambulatory Visit: Payer: 59

## 2021-02-03 MED FILL — busPIRone HCL 30 MG TABS: 30 | 30 days supply | Qty: 60 | Fill #2

## 2021-02-03 MED FILL — DULoxetine HCL 60 MG CPEP: 60 | 30 days supply | Qty: 60 | Fill #3

## 2021-02-04 NOTE — Progress Notes (Signed)
Patient Care Team: Wenda Low, MD as PCP - General (Internal Medicine) Rockwell Germany, RN as Oncology Nurse Navigator Mauro Kaufmann, RN as Oncology Nurse Navigator Coralie Keens, MD as Consulting Physician (General Surgery) Nicholas Lose, MD as Consulting Physician (Hematology and Oncology) Gery Pray, MD as Consulting Physician (Radiation Oncology) Gwyndolyn Kaufman, RN as Registered Nurse  DIAGNOSIS:    ICD-10-CM   1. Malignant neoplasm of upper-outer quadrant of left breast in female, estrogen receptor positive ([place])  C50.412    Z17.0     SUMMARY OF ONCOLOGIC HISTORY: Oncology History  Malignant neoplasm of upper-outer quadrant of left breast in female, estrogen receptor positive ([place])  09/19/2020 Initial Diagnosis   Mammogram in 07/2018 showed probably benign bilateral breast masses that were stable on her last mammogram on 07/26/19. Mammogram on 08/28/20 showed the stable bilateral masses, a new 0.9cm mass at the 1 o'clock position in the left breast, and one abnormal left axillary lymph node with 0.6cm cortical thickening. 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    10/02/2020 Genetic Testing   Negative genetic testing: no pathogenic variants detected in [facility] Common-Hereditary Cancers Panel.  Variants of uncertain significance detected in APC c.6918T>A (p.Asp2306Glu) and POLE  c.2612G>C (p.Ser871Thr).  The report date is November 24. 2021.   The Common Hereditary Cancers Panel offered by [facility] includes sequencing and/or deletion duplication testing of the following 48 genes: APC, ATM, AXIN2, BARD1, BMPR1A, BRCA1, BRCA2, BRIP1, CDH1, CDK4, CDKN2A (p14ARF), CDKN2A (p16INK4a), CHEK2, CTNNA1, DICER1, EPCAM (Deletion/duplication testing only), GREM1 (promoter region deletion/duplication testing only), KIT, MEN1, MLH1, MSH2, MSH3, MSH6, MUTYH, NBN, NF1, NHTL1, PALB2,  PDGFRA, PMS2, POLD1, POLE, PTEN, RAD50, RAD51C, RAD51D, RNF43, SDHB, SDHC, SDHD, SMAD4, SMARCA4. STK11, TP53, TSC1, TSC2, and VHL.  The following genes were evaluated for sequence changes only: SDHA and HOXB13 c.251G>A variant only.   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    12/04/2020 Cancer Staging   Staging form: Breast, AJCC 8th Edition - Pathologic stage from 12/04/2020: Stage IA (pT1c, pN1a, cM0, G2, ER+, PR+, HER2-) - Signed by [name], NP on 12/18/2020 Histologic grading system: 3 grade system   01/22/2021 -  Chemotherapy    Patient is on Treatment Plan: BREAST ADJUVANT DOSE DENSE AC Q14D / PACLITAXEL Q7D        CHIEF COMPLIANT: Cycle 2 Day 1 Adriamycin and Cytoxan  INTERVAL HISTORY: Jennifer Davis is a 45 y.o. with above-mentioned history of left breastcancerwhounderwentbilateral mastectomies with reconstructionand is currently on adjuvant chemotherapy with Adriamycin and Cytoxan.She presents to the clinic todayfor a toxicity check and cycle 2. She had nausea intermittently but also gastritis and acid reflux.  She has not been taking her antacid medication.  Fatigue has improved and she is eating much better over the last few days.  She has noticed hair falling off .  ALLERGIES:  has No Known Allergies.  MEDICATIONS:  Current Outpatient Medications  Medication Sig Dispense Refill  . acetaminophen (TYLENOL) 500 MG tablet Take 1 tablet (500 mg total) by mouth every 6 (six) hours as needed. For use AFTER surgery (Patient taking differently: Take 500 mg by mouth every 6 (six) hours as needed for mild pain, fever or headache.) 30  tablet 0  . busPIRone (BUSPAR) 30 MG tablet Take 1 tablet (30 mg total) by mouth 2 (two) times daily. 60 tablet 5  . cetirizine (ZYRTEC)  10 MG tablet Take 10 mg by mouth daily.    Marland Kitchen dexamethasone (DECADRON) 4 MG tablet Take 1 tablet (4 mg total) by mouth daily. Take 1 tab day after chemo and 1 tab 2 days after chemo. Take with food. 8 tablet 0  . diazePAM (VALIUM PO) Take 1 tablet by mouth as needed (muscle spasms).    . DULoxetine (CYMBALTA) 60 MG capsule Take 2 capsules (120 mg total) by mouth daily. 60 capsule 5  . EPINEPHrine (EPIPEN 2-PAK) 0.3 mg/0.3 mL IJ SOAJ injection Inject 0.3 mLs (0.3 mg total) into the muscle once as needed for up to 1 dose (for severe allergic reaction). CAll 911 immediately if you have to use this medicine 1 each 1  . gabapentin (NEURONTIN) 100 MG capsule Take 1 capsule (100 mg total) by mouth 3 (three) times daily as needed. (Patient taking differently: Take 100 mg by mouth 3 (three) times daily as needed (neuropathy).) 90 capsule 1  . ibuprofen (ADVIL) 600 MG tablet Take 1 tablet (600 mg total) by mouth every 6 (six) hours as needed for mild pain or moderate pain. For use AFTER surgery 30 tablet 0  . lidocaine-prilocaine (EMLA) cream Apply to affected area once (Patient taking differently: Apply 1 application topically as needed (port access).) 30 g 3  . LORazepam (ATIVAN) 0.5 MG tablet Take 0.5 mg by mouth at bedtime as needed (nausea).    . melatonin 5 MG TABS Take 5 mg by mouth at bedtime as needed (sleep).    . montelukast (SINGULAIR) 10 MG tablet Take 10 mg by mouth daily.  5  . Multiple Vitamin (MULTIVITAMIN WITH MINERALS) TABS tablet Take 1 tablet by mouth daily.    . Multiple Vitamins-Minerals (MULTIVITAMIN PO) Take 2 tablets by mouth daily. Skin/hair/nail supplement.    . ondansetron (ZOFRAN) 8 MG tablet Take 1 tablet (8 mg total) by mouth 2 (two) times daily as needed. Start on the third day after chemotherapy. (Patient not taking: Reported on 01/24/2021) 30 tablet 1  . ondansetron (ZOFRAN-ODT) 4 MG disintegrating tablet Take 1 tablet (4 mg total) by mouth every 8 (eight) hours as needed for  nausea or vomiting. (Patient not taking: Reported on 01/24/2021) 20 tablet 0  . PROAIR HFA 108 (90 Base) MCG/ACT inhaler Inhale 2 puffs into the lungs as needed for wheezing or shortness of breath.  0  . prochlorperazine (COMPAZINE) 10 MG tablet Take 1 tablet (10 mg total) by mouth every 6 (six) hours as needed (Nausea or vomiting). 30 tablet 1  . SYMBICORT 160-4.5 MCG/ACT inhaler Inhale 2 puffs into the lungs 2 (two) times daily.  5  . tamoxifen (NOLVADEX) 10 MG tablet Take 1 tablet (10 mg total) by mouth daily. 30 tablet 0   No current facility-administered medications for this visit.    PHYSICAL EXAMINATION: ECOG PERFORMANCE STATUS: 1 - Symptomatic but completely ambulatory  Vitals:   02/05/21 0938  Pulse: (!) 117  Resp: 20  Temp: 97.7 F (36.5 C)  SpO2: 98%   Filed Weights   02/05/21 0938  Weight: 217 lb 9.6 oz (98.7 kg)    LABORATORY DATA:  I have reviewed the data as listed CMP Latest Ref Rng & Units 02/05/2021 01/29/2021 01/22/2021  Glucose 70 - 99 mg/dL 131(H) 104(H) 160(H)  BUN 6 - 20 mg/dL 9 7 9  Creatinine 0.44 - 1.00 mg/dL 0.72 0.70 0.78  Sodium 135 - 145 mmol/L 140 139 138  Potassium 3.5 - 5.1 mmol/L 3.4(L) 4.0 3.2(L)  Chloride 98 - 111 mmol/L 106 103 106  CO2 22 - 32 mmol/L _0 Calcium 8.9 - 10.3 mg/dL 8.5(L) 8.5(L) 8.6(L)  Total Protein 6.5 - 8.1 g/dL 6.8 6.6 6.8  Total Bilirubin 0.3 - 1.2 mg/dL <0.2(L) 0.3 0.2(L)  Alkaline Phos 38 - 126 U/L 118 100 82  AST 15 - 41 U/L 12(L) 10(L) 8(L)  ALT 0 - 44 U/L _1 Lab Results  Component Value Date   WBC 16.1 (H) 02/05/2021   HGB 9.1 (L) 02/05/2021   HCT 29.5 (L) 02/05/2021   MCV 83.3 02/05/2021   PLT 314 02/05/2021   NEUTROABS 9.4 (H) 02/05/2021    ASSESSMENT & PLAN:  Malignant neoplasm of upper-outer quadrant of left breast in female, estrogen receptor positive ([place]) 09/20/2019: Screening mammogram on 08/28/20 showed the stable bilateral masses, a new 0.9cm mass at the 1 o'clock position in the  left breast, and one abnormal left axillary lymph node with 0.6cm cortical thickening. 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 MammaPrint: High risk: Luminal type B, 5-year metastasis free survival with chemo and hormone therapy: 93%, absolute benefit from chemo greater than 12%, average 10-year risk of recurrence untreated: 29%  Treatment plan: 1.Mastectomywith targeted node dissection 2.adjuvant chemotherapy with dose dense Adriamycin and Cytoxan followed by Taxol 3.Adjuvant radiation 4.Followed by adjuvant antiestrogen therapy ------------------------------------------------------------------------------------------------------------- Bilateral mastectomies with reconstruction [name]):  Right breast: no malignancy  Left breast: invasive and in situ ductal carcinoma, 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 -------------------------------------------------------------------------------------------------- Current Treatment: Cycle 2 DD Adriamycin and Cytoxan 12/30/20: ECHO EF 65-70% Labs reviewed  Chemo Toxicities: 1.  Fatigue 2. Nausea: Improved 3.  Anemia: Hemoglobin 9.1: On oral iron therapy Encouraged her to eat more protein in diet.  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  In her house there are multiple dogs that belonged to herself and her sister.   RTC in 2 weeks for cycle 3     No orders of the defined types were placed in this encounter.  The patient has a good understanding of the overall plan. she agrees with it. she will call with any problems that may develop before the next visit here.  Total time spent: 30 mins including face to face time and time spent for planning, charting and coordination of care  Rulon Eisenmenger, MD, MPH 02/05/2021  I, Molly Dorshimer, am acting as scribe for Dr. Nicholas Lose.  I have reviewed the above documentation for accuracy and completeness, and I agree with the above.

## 2021-02-04 NOTE — Assessment & Plan Note (Signed)
09/20/2019: Screening mammogram on 08/28/20 showed the stable bilateral masses, a new 0.9cm mass at the 1 o'clock position in the left breast, and one abnormal left axillary lymph node with 0.6cm cortical thickening. 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 MammaPrint: High risk: Luminal type B, 5-year metastasis free survival with chemo and hormone therapy: 93%, absolute benefit from chemo greater than 12%, average 10-year risk of recurrence untreated: 29%  Treatment plan: 1.Mastectomywith targeted node dissection 2.adjuvant chemotherapy with dose dense Adriamycin and Cytoxan followed by Taxol 3.Adjuvant radiation 4.Followed by adjuvant antiestrogen therapy ------------------------------------------------------------------------------------------------------------- Bilateral mastectomies with reconstruction [name]):  Right breast: no malignancy  Left breast: invasive and in situ ductal carcinoma, 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 -------------------------------------------------------------------------------------------------- Current Treatment: Cycle 2 DD Adriamycin and Cytoxan 12/30/20: ECHO EF 65-70% Labs reviewed  Chemo Toxicities: 1.  Fatigue 2. Nausea 3.  Anemia: Hemoglobin 9.4  Her blood sugars today are 104.  She is not diabetic.  Anemia: Patient will start taking oral iron therapy. RTC in 2 weeks for cycle 3

## 2021-02-05 ENCOUNTER — Other Ambulatory Visit: Payer: Self-pay

## 2021-02-05 ENCOUNTER — Inpatient Hospital Stay: Payer: 59

## 2021-02-05 ENCOUNTER — Inpatient Hospital Stay (HOSPITAL_BASED_OUTPATIENT_CLINIC_OR_DEPARTMENT_OTHER): Payer: 59 | Admitting: Hematology and Oncology

## 2021-02-05 DIAGNOSIS — Z95828 Presence of other vascular implants and grafts: Secondary | ICD-10-CM

## 2021-02-05 DIAGNOSIS — Z5189 Encounter for other specified aftercare: Secondary | ICD-10-CM | POA: Diagnosis not present

## 2021-02-05 DIAGNOSIS — C50412 Malignant neoplasm of upper-outer quadrant of left female breast: Secondary | ICD-10-CM

## 2021-02-05 DIAGNOSIS — Z170 Estrogen receptor positive status [ER+]: Secondary | ICD-10-CM

## 2021-02-05 DIAGNOSIS — R5383 Other fatigue: Secondary | ICD-10-CM

## 2021-02-05 DIAGNOSIS — Z5111 Encounter for antineoplastic chemotherapy: Secondary | ICD-10-CM | POA: Diagnosis not present

## 2021-02-05 DIAGNOSIS — C50919 Malignant neoplasm of unspecified site of unspecified female breast: Secondary | ICD-10-CM

## 2021-02-05 DIAGNOSIS — C773 Secondary and unspecified malignant neoplasm of axilla and upper limb lymph nodes: Secondary | ICD-10-CM | POA: Diagnosis not present

## 2021-02-05 LAB — CMP (CANCER CENTER ONLY)
ALT: 16 U/L (ref 0–44)
AST: 12 U/L — ABNORMAL LOW (ref 15–41)
Albumin: 3.2 g/dL — ABNORMAL LOW (ref 3.5–5.0)
Alkaline Phosphatase: 118 U/L (ref 38–126)
Anion gap: 12 (ref 5–15)
BUN: 9 mg/dL (ref 6–20)
CO2: 22 mmol/L (ref 22–32)
Calcium: 8.5 mg/dL — ABNORMAL LOW (ref 8.9–10.3)
Chloride: 106 mmol/L (ref 98–111)
Creatinine: 0.72 mg/dL (ref 0.44–1.00)
GFR, Estimated: >60 mL/min (ref >60)
Glucose, Bld: 131 mg/dL — ABNORMAL HIGH (ref 70–99)
Potassium: 3.4 mmol/L — ABNORMAL LOW (ref 3.5–5.1)
Sodium: 140 mmol/L (ref 135–145)
Total Bilirubin: <0.2 mg/dL — ABNORMAL LOW (ref 0.3–1.2)
Total Protein: 6.8 g/dL (ref 6.5–8.1)

## 2021-02-05 LAB — CBC WITH DIFFERENTIAL (CANCER CENTER ONLY)
Abs Immature Granulocytes: 3.9 10*3/uL — ABNORMAL HIGH (ref 0.00–0.07)
Basophils Absolute: 0.1 10*3/uL (ref 0.0–0.1)
Basophils Relative: 0 %
Eosinophils Absolute: 0.1 10*3/uL (ref 0.0–0.5)
Eosinophils Relative: 0 %
HCT: 29.5 % — ABNORMAL LOW (ref 36.0–46.0)
Hemoglobin: 9.1 g/dL — ABNORMAL LOW (ref 12.0–15.0)
Immature Granulocytes: 24 %
Lymphocytes Relative: 12 %
Lymphs Abs: 1.9 10*3/uL (ref 0.7–4.0)
MCH: 25.7 pg — ABNORMAL LOW (ref 26.0–34.0)
MCHC: 30.8 g/dL (ref 30.0–36.0)
MCV: 83.3 fL (ref 80.0–100.0)
Monocytes Absolute: 0.8 10*3/uL (ref 0.1–1.0)
Monocytes Relative: 5 %
Neutro Abs: 9.4 10*3/uL — ABNORMAL HIGH (ref 1.7–7.7)
Neutrophils Relative %: 59 %
Platelet Count: 314 10*3/uL (ref 150–400)
RBC: 3.54 MIL/uL — ABNORMAL LOW (ref 3.87–5.11)
RDW: 15.6 % — ABNORMAL HIGH (ref 11.5–15.5)
WBC Count: 16.1 10*3/uL — ABNORMAL HIGH (ref 4.0–10.5)
nRBC: 0.9 % — ABNORMAL HIGH (ref 0.0–0.2)

## 2021-02-05 LAB — FOLATE: Folate: 7.3 ng/mL (ref >5.9)

## 2021-02-05 LAB — IRON AND TIBC
Iron: 47 ug/dL (ref 41–142)
Saturation Ratios: 13 % — ABNORMAL LOW (ref 21–57)
TIBC: 369 ug/dL (ref 236–444)
UIBC: 322 ug/dL (ref 120–384)

## 2021-02-05 LAB — VITAMIN B12: Vitamin B-12: 2590 pg/mL — ABNORMAL HIGH (ref 180–914)

## 2021-02-05 LAB — FERRITIN: Ferritin: 98 ng/mL (ref 11–307)

## 2021-02-05 MED ORDER — SODIUM CHLORIDE 0.9 % IV SOLN
600.0000 mg/m2 | Freq: Once | INTRAVENOUS | Status: AC
  Administered 2021-02-05: 1140 mg via INTRAVENOUS
  Filled 2021-02-05: qty 57

## 2021-02-05 MED ORDER — HEPARIN SOD (PORK) LOCK FLUSH 100 UNIT/ML IV SOLN
500.0000 [IU] | Freq: Once | INTRAVENOUS | Status: AC | PRN
  Administered 2021-02-05: 500 [IU]
  Filled 2021-02-05: qty 5

## 2021-02-05 MED ORDER — SODIUM CHLORIDE 0.9% FLUSH
10.0000 mL | INTRAVENOUS | Status: DC | PRN
  Administered 2021-02-05: 10 mL
  Filled 2021-02-05: qty 10

## 2021-02-05 MED ORDER — INV-OLANZAPINE/PLACEBO 10 MG CAP URCC 16070 BLISTER CARD 2: 1.0000 | ORAL_CAPSULE | Freq: Every day | ORAL | 0 refills | Status: DC

## 2021-02-05 MED ORDER — INV-PROCHLORPERAZINE MALEATE/PLACEBO 10 MG CAP URCC 16070 BLISTER CARD 2: 1.0000 | ORAL_CAPSULE | Freq: Three times a day (TID) | ORAL | 0 refills | Status: DC

## 2021-02-05 MED ORDER — INV-PROCHLORPERAZINE MALEATE/PLACEBO 10 MG CAP URCC 16070
ORAL_TABLET | Freq: Once | ORAL | Status: AC
  Filled 2021-02-05: qty 1

## 2021-02-05 MED ORDER — INV-DEXAMETHASONE 4 MG PO TAB URCC 16070 BLISTER CARD 2: 2.0000 | ORAL_TABLET | Freq: Every day | ORAL | 0 refills | Status: DC

## 2021-02-05 MED ORDER — DOXORUBICIN HCL CHEMO IV INJECTION 2 MG/ML
60.0000 mg/m2 | Freq: Once | INTRAVENOUS | Status: AC
Start: 2021-02-05 — End: 2021-02-05
  Administered 2021-02-05: 114 mg via INTRAVENOUS
  Filled 2021-02-05: qty 57

## 2021-02-05 MED ORDER — SODIUM CHLORIDE 0.9 % IV SOLN
Freq: Once | INTRAVENOUS | Status: AC
  Filled 2021-02-05: qty 250

## 2021-02-05 MED ORDER — SODIUM CHLORIDE 0.9% FLUSH
10.0000 mL | Freq: Once | INTRAVENOUS | Status: AC
  Administered 2021-02-05: 10 mL
  Filled 2021-02-05: qty 10

## 2021-02-05 NOTE — Patient Instructions (Signed)
  [facility] Discharge Instructions for Patients Receiving Chemotherapy  Today you received the following chemotherapy agents Doxorucin(Adramycin), Cyclophosphamide(Cytoxan)  To help prevent nausea and vomiting after your treatment, we encourage you to take your nausea medication as directed.   If you develop nausea and vomiting that is not controlled by your nausea medication, call the clinic.   BELOW ARE SYMPTOMS THAT SHOULD BE REPORTED IMMEDIATELY:  *FEVER GREATER THAN 100.5 F  *CHILLS WITH OR WITHOUT FEVER  NAUSEA AND VOMITING THAT IS NOT CONTROLLED WITH YOUR NAUSEA MEDICATION  *UNUSUAL SHORTNESS OF BREATH  *UNUSUAL BRUISING OR BLEEDING  TENDERNESS IN MOUTH AND THROAT WITH OR WITHOUT PRESENCE OF ULCERS  *URINARY PROBLEMS  *BOWEL PROBLEMS  UNUSUAL RASH Items with * indicate a potential emergency and should be followed up as soon as possible.  Feel free to call the clinic should you have any questions or concerns. The clinic phone number is [phone].  Please show the [facility] at check-in to the Emergency Department and triage nurse.

## 2021-02-05 NOTE — Research (Signed)
TRIAL - TREATMENT OF REFRACTORY NAUSEA [id number]     Patient arrives to the cancer center Unaccompanied for her cycle 2 visit.    PROs: No PROs are required prior to treatment at this visit. Patient is provided all take-home PROs and the four-day home record along with a postage-paid return envelope. Each form is reviewed briefly and Jennifer Davis verbalizes understanding of how to complete each form. Jennifer Davis has a follow-up appointment scheduled for 02/19/21 and plans to return completed diaries and any remaining study medication (if any) at that visit.   LABS: Study labs are not required at this timepoint.   MEDICATION REVIEW: Patient reviews and verifies the current medication list is correct. Dexamethasone, diazepam, and lorazepam are all clearly labeled on her medication list as, "DO NOT TAKE WITH STUDY MEDS" and a copy of her med list is provided along with her PROs. Jennifer Davis verbalizes understanding that she must avoid benzodiazepines while taking study medications.  CONTRACEPTION: Jennifer Davis is pre-menopausal with LMP 12/19/2020; a urine pregnancy test on 01/22/2021 is negative. Jennifer Davis understands the importance of avoiding pregnancy while on study: she states her spouse has had a vasectomy.  MD VISIT: Patient sees Dr. Lindi Adie today. Lab results are ok and patient may proceed with treatment.   ADVERSE EVENTS: Patient Jennifer Davis has no adverse events related to the study questionnaires or the study blood draw for cycle 1.  STUDY DRUG ADMINISTRATION: Day 1 blister card and 2nd take-home blister card are collected and verified with Jennifer Davis in the pharmacy, lot# [id number].   Per study protocol, one 300mg  Netupitant/Palonosetron capsule is administered PO by this nurse at 1033am. Dexamethasone 12mg  PO is administered by this nurse at 1103. One 10mg  prochlorperazine/placebo capsule and one 10mg  olanzapine/placebo capsule are administered together PO by this nurse at 1137. Doxorubicin injection starts at  1139. Patient tolerates all study medications without complaint.  GIFT CARD: This study does not provide visit compensation.   DISPOSITION: Upon completion off study medication administration, patient remains in the infusion area for remaining scheduled infusions.   Jennifer Davis was thanked for her time and continued voluntary participation in this study. Patient Jennifer Davis has been provided direct contact information and is encouraged to contact this Nurse for any needs or questions and she verbalizes understanding.  Current plan is to follow-up with Jennifer Davis on Monday, 02/10/21, to verify study diary/PRO completion and assess for adverse events.  Jennifer Davis, BSN, RN, CIC 02/05/2021 2:30 PM

## 2021-02-05 NOTE — Patient Instructions (Signed)
Implanted Port Insertion, Care After This sheet gives you information about how to care for yourself after your procedure. Your health care provider may also give you more specific instructions. If you have problems or questions, contact your health care provider. What can I expect after the procedure? After the procedure, it is common to have:  Discomfort at the port insertion site.  Bruising on the skin over the port. This should improve over 3-4 days. Follow these instructions at home: Port care  After your port is placed, you will get a manufacturer's information card. The card has information about your port. Keep this card with you at all times.  Take care of the port as told by your health care provider. Ask your health care provider if you or a family member can get training for taking care of the port at home. A home health care nurse may also take care of the port.  Make sure to remember what type of port you have. Incision care  Follow instructions from your health care provider about how to take care of your port insertion site. Make sure you: ? Wash your hands with soap and water before and after you change your bandage (dressing). If soap and water are not available, use hand sanitizer. ? Change your dressing as told by your health care provider. ? Leave stitches (sutures), skin glue, or adhesive strips in place. These skin closures may need to stay in place for 2 weeks or longer. If adhesive strip edges start to loosen and curl up, you may trim the loose edges. Do not remove adhesive strips completely unless your health care provider tells you to do that.  Check your port insertion site every day for signs of infection. Check for: ? Redness, swelling, or pain. ? Fluid or blood. ? Warmth. ? Pus or a bad smell.      Activity  Return to your normal activities as told by your health care provider. Ask your health care provider what activities are safe for you.  Do not  lift anything that is heavier than 10 lb (4.5 kg), or the limit that you are told, until your health care provider says that it is safe. General instructions  Take over-the-counter and prescription medicines only as told by your health care provider.  Do not take baths, swim, or use a hot tub until your health care provider approves. Ask your health care provider if you may take showers. You may only be allowed to take sponge baths.  Do not drive for 24 hours if you were given a sedative during your procedure.  Wear a medical alert bracelet in case of an emergency. This will tell any health care providers that you have a port.  Keep all follow-up visits as told by your health care provider. This is important. Contact a health care provider if:  You cannot flush your port with saline as directed, or you cannot draw blood from the port.  You have a fever or chills.  You have redness, swelling, or pain around your port insertion site.  You have fluid or blood coming from your port insertion site.  Your port insertion site feels warm to the touch.  You have pus or a bad smell coming from the port insertion site. Get help right away if:  You have chest pain or shortness of breath.  You have bleeding from your port that you cannot control. Summary  Take care of the port as told by your   health care provider. Keep the manufacturer's information card with you at all times.  Change your dressing as told by your health care provider.  Contact a health care provider if you have a fever or chills or if you have redness, swelling, or pain around your port insertion site.  Keep all follow-up visits as told by your health care provider. This information is not intended to replace advice given to you by your health care provider. Make sure you discuss any questions you have with your health care provider. Document Revised: 05/24/2018 Document Reviewed: 05/24/2018 Elsevier Patient Education   2021 [employer]  

## 2021-02-07 ENCOUNTER — Other Ambulatory Visit: Payer: Self-pay

## 2021-02-07 ENCOUNTER — Inpatient Hospital Stay: Payer: 59 | Attending: Hematology and Oncology

## 2021-02-07 VITALS — BP 135/91 | HR 101 | Temp 98.4°F | Resp 18

## 2021-02-07 DIAGNOSIS — C773 Secondary and unspecified malignant neoplasm of axilla and upper limb lymph nodes: Secondary | ICD-10-CM | POA: Diagnosis not present

## 2021-02-07 DIAGNOSIS — Z170 Estrogen receptor positive status [ER+]: Secondary | ICD-10-CM | POA: Diagnosis not present

## 2021-02-07 DIAGNOSIS — D6481 Anemia due to antineoplastic chemotherapy: Secondary | ICD-10-CM | POA: Diagnosis not present

## 2021-02-07 DIAGNOSIS — Z5111 Encounter for antineoplastic chemotherapy: Secondary | ICD-10-CM | POA: Insufficient documentation

## 2021-02-07 DIAGNOSIS — C50412 Malignant neoplasm of upper-outer quadrant of left female breast: Secondary | ICD-10-CM | POA: Diagnosis not present

## 2021-02-07 MED ORDER — PEGFILGRASTIM-BMEZ 6 MG/0.6ML ~~LOC~~ SOSY
6.0000 mg | PREFILLED_SYRINGE | Freq: Once | SUBCUTANEOUS | Status: AC
  Administered 2021-02-07: 6 mg via SUBCUTANEOUS

## 2021-02-07 MED ORDER — PEGFILGRASTIM-BMEZ 6 MG/0.6ML ~~LOC~~ SOSY
PREFILLED_SYRINGE | SUBCUTANEOUS | Status: AC
  Filled 2021-02-07: qty 0.6

## 2021-02-07 NOTE — Patient Instructions (Signed)
Pegfilgrastim injection What is this medicine? PEGFILGRASTIM (PEG fil gra stim) is a long-acting granulocyte colony-stimulating factor that stimulates the growth of neutrophils, a type of white blood cell important in the body's fight against infection. It is used to reduce the incidence of fever and infection in patients with certain types of cancer who are receiving chemotherapy that affects the bone marrow, and to increase survival after being exposed to high doses of radiation. This medicine may be used for other purposes; ask your health care provider or pharmacist if you have questions. COMMON BRAND NAME(S): Fulphila, Neulasta, Nyvepria, UDENYCA, Ziextenzo What should I tell my health care provider before I take this medicine? They need to know if you have any of these conditions:  kidney disease  latex allergy  ongoing radiation therapy  sickle cell disease  skin reactions to acrylic adhesives (On-Body Injector only)  an unusual or allergic reaction to pegfilgrastim, filgrastim, other medicines, foods, dyes, or preservatives  pregnant or trying to get pregnant  breast-feeding How should I use this medicine? This medicine is for injection under the skin. If you get this medicine at home, you will be taught how to prepare and give the pre-filled syringe or how to use the On-body Injector. Refer to the patient Instructions for Use for detailed instructions. Use exactly as directed. Tell your healthcare provider immediately if you suspect that the On-body Injector may not have performed as intended or if you suspect the use of the On-body Injector resulted in a missed or partial dose. It is important that you put your used needles and syringes in a special sharps container. Do not put them in a trash can. If you do not have a sharps container, call your pharmacist or healthcare provider to get one. Talk to your pediatrician regarding the use of this medicine in children. While this drug  may be prescribed for selected conditions, precautions do apply. Overdosage: If you think you have taken too much of this medicine contact a poison control center or emergency room at once. NOTE: This medicine is only for you. Do not share this medicine with others. What if I miss a dose? It is important not to miss your dose. Call your doctor or health care professional if you miss your dose. If you miss a dose due to an On-body Injector failure or leakage, a new dose should be administered as soon as possible using a single prefilled syringe for manual use. What may interact with this medicine? Interactions have not been studied. This list may not describe all possible interactions. Give your health care provider a list of all the medicines, herbs, non-prescription drugs, or dietary supplements you use. Also tell them if you smoke, drink alcohol, or use illegal drugs. Some items may interact with your medicine. What should I watch for while using this medicine? Your condition will be monitored carefully while you are receiving this medicine. You may need blood work done while you are taking this medicine. Talk to your health care provider about your risk of cancer. You may be more at risk for certain types of cancer if you take this medicine. If you are going to need a MRI, CT scan, or other procedure, tell your doctor that you are using this medicine (On-Body Injector only). What side effects may I notice from receiving this medicine? Side effects that you should report to your doctor or health care professional as soon as possible:  allergic reactions (skin rash, itching or hives, swelling of   the face, lips, or tongue)  back pain  dizziness  fever  pain, redness, or irritation at site where injected  pinpoint red spots on the skin  red or dark-brown urine  shortness of breath or breathing problems  stomach or side pain, or pain at the shoulder  swelling  tiredness  trouble  passing urine or change in the amount of urine  unusual bruising or bleeding Side effects that usually do not require medical attention (report to your doctor or health care professional if they continue or are bothersome):  bone pain  muscle pain This list may not describe all possible side effects. Call your doctor for medical advice about side effects. You may report side effects to FDA at 1-800-FDA-1088. Where should I keep my medicine? Keep out of the reach of children. If you are using this medicine at home, you will be instructed on how to store it. Throw away any unused medicine after the expiration date on the label. NOTE: This sheet is a summary. It may not cover all possible information. If you have questions about this medicine, talk to your doctor, pharmacist, or health care provider.  2021 Elsevier/Gold Standard (2019-11-17 13:20:51)  

## 2021-02-08 ENCOUNTER — Other Ambulatory Visit (HOSPITAL_COMMUNITY): Payer: Self-pay

## 2021-02-10 ENCOUNTER — Telehealth: Payer: Self-pay

## 2021-02-10 DIAGNOSIS — Z170 Estrogen receptor positive status [ER+]: Secondary | ICD-10-CM

## 2021-02-10 DIAGNOSIS — C50412 Malignant neoplasm of upper-outer quadrant of left female breast: Secondary | ICD-10-CM

## 2021-02-10 NOTE — Telephone Encounter (Signed)
Trial:  TESTING THE ADDITION OF TWO APPROVED DRUGS FOR PERSISTENT NAUSEA [facility] [id number])  02/10/2021 11:30AM  OUTGOING CALL: Outgoing call to [name]: I verified that I was speaking with the correct person and we spoke for approximately five minutes. This is the follow-up call for Cycle 2. Jennifer Davis states she did great and, "was so much better than the last time." She has completed some of the questionnaires online - she states she has one more to complete. She denies any adverse events. She reports having three capsules that were not taken: one the afternoon of treatment (she was still in the Infusion center receiving chemotherapy), one from that same evening (forgot), and one from day 3 (forgot). Jennifer Davis is reminded not to throw any remaining medications away: they should be returned when she comes for her follow-up visit with Dr. Lindi Adie on 02/19/21: she verbalizes understanding. I inform her that someone from the Research department will reach out to her on 02/18/21 to remind her to bring remaining study medications and any paper diaries with her on 02/19/21. Jennifer Davis is thanked for her time and continued participation in the study and is encouraged to contact me directly for needs: she verbalizes appreciation and understanding.  The current plan is to follow up with Jennifer Davis next Wednesda7, 02/19/21, during her scheduled provider visit.   Dionne Bucy. Sharlett Iles, BSN, RN, CIC 02/10/2021 11:40 AM

## 2021-02-11 ENCOUNTER — Ambulatory Visit (INDEPENDENT_AMBULATORY_CARE_PROVIDER_SITE_OTHER): Payer: 59 | Admitting: Surgical

## 2021-02-11 ENCOUNTER — Other Ambulatory Visit (HOSPITAL_COMMUNITY): Payer: Self-pay

## 2021-02-11 ENCOUNTER — Other Ambulatory Visit: Payer: Self-pay

## 2021-02-11 ENCOUNTER — Encounter: Payer: Self-pay | Admitting: Surgical

## 2021-02-11 VITALS — BP 112/68 | HR 104

## 2021-02-11 DIAGNOSIS — Z9013 Acquired absence of bilateral breasts and nipples: Secondary | ICD-10-CM

## 2021-02-11 DIAGNOSIS — C50412 Malignant neoplasm of upper-outer quadrant of left female breast: Secondary | ICD-10-CM

## 2021-02-11 DIAGNOSIS — Z170 Estrogen receptor positive status [ER+]: Secondary | ICD-10-CM

## 2021-02-11 MED ORDER — SULFAMETHOXAZOLE-TRIMETHOPRIM 800-160 MG PO TABS
1.0000 | ORAL_TABLET | Freq: Two times a day (BID) | ORAL | 0 refills | Status: AC
  Filled 2021-02-11: qty 20, 10d supply, fill #0

## 2021-02-11 NOTE — Progress Notes (Signed)
Patient is a 45 year old female here for follow-up on her bilateral breast reconstruction.  She most recently had the right tissue expander replaced on 01/04/2021.  She is 10 weeks postop from this.  She had the initial expanders placed on 12/04/2020.  She reports overall she is doing well, she has started chemotherapy and is overall doing okay with this.  She reports she has noticed some redness of the right breast, they have been monitoring this.  She is not having any infectious symptoms.  She is here with her spouse today.  Chaperone present on exam On exam bilateral mastectomy flap incisions are intact and healing nicely.  She does have some redness of the right mastectomy flaps, there is no cellulitic changes or subcutaneous fluid collections noted.  There is no excessive warmth.  No purulence or fluctuance noted.  No drainage noted.  No wounds noted.   We did not place injectable saline in the Expander using a sterile technique: Right: 0 cc for a total of 250 / 535 cc Left: 0 cc for a total of 350 / 535 cc  Given that the patient is currently undergoing chemotherapy, has an expander in place and previously had some cellulitic changes of her right breast prior to expander exchange have a low threshold to treat.  We will send in 10 days of antibiotic for treatment.  I do not see any overt cellulitic changes or infection on exam today, however her immune response may be inhibited due to her chemotherapy treatments.  I discussed this with the patient and she is in agreement with this plan.  There are no previous cultures for guidance.  Pictures were obtained of the patient and placed in the chart with the patient's or guardian's permission.  Recommend following up in 2 weeks for reevaluation, I did discuss with her if she notices any worsening of her symptoms or increased redness to please call our office and I would be happy to see her sooner.

## 2021-02-17 ENCOUNTER — Other Ambulatory Visit (HOSPITAL_COMMUNITY): Payer: Self-pay

## 2021-02-18 ENCOUNTER — Telehealth: Payer: Self-pay | Admitting: *Deleted

## 2021-02-18 NOTE — Telephone Encounter (Deleted)
Called and spoke to patient to remind her of scheduled appointments on 02/19/21.  Also reminded patient to bring any leftover study medications,cycle diaries and forms with her to the appointment.

## 2021-02-18 NOTE — Progress Notes (Signed)
Patient Care Team: Wenda Low, MD as PCP - General (Internal Medicine) Rockwell Germany, RN as Oncology Nurse Navigator Mauro Kaufmann, RN as Oncology Nurse Navigator Coralie Keens, MD as Consulting Physician (General Surgery) Nicholas Lose, MD as Consulting Physician (Hematology and Oncology) Gery Pray, MD as Consulting Physician (Radiation Oncology) Gwyndolyn Kaufman, RN as Registered Nurse  DIAGNOSIS:    ICD-10-CM   1. Malignant neoplasm of upper-outer quadrant of left breast in female, estrogen receptor positive ([place])  C50.412    Z17.0     SUMMARY OF ONCOLOGIC HISTORY: Oncology History  Malignant neoplasm of upper-outer quadrant of left breast in female, estrogen receptor positive ([place])  09/19/2020 Initial Diagnosis   Mammogram in 07/2018 showed probably benign bilateral breast masses that were stable on her last mammogram on 07/26/19. Mammogram on 08/28/20 showed the stable bilateral masses, a new 0.9cm mass at the 1 o'clock position in the left breast, and one abnormal left axillary lymph node with 0.6cm cortical thickening. 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    10/02/2020 Genetic Testing   Negative genetic testing: no pathogenic variants detected in [facility] Common-Hereditary Cancers Panel.  Variants of uncertain significance detected in APC c.6918T>A (p.Asp2306Glu) and POLE  c.2612G>C (p.Ser871Thr).  The report date is November 24. 2021.   The Common Hereditary Cancers Panel offered by [facility] includes sequencing and/or deletion duplication testing of the following 48 genes: APC, ATM, AXIN2, BARD1, BMPR1A, BRCA1, BRCA2, BRIP1, CDH1, CDK4, CDKN2A (p14ARF), CDKN2A (p16INK4a), CHEK2, CTNNA1, DICER1, EPCAM (Deletion/duplication testing only), GREM1 (promoter region deletion/duplication testing only), KIT, MEN1, MLH1, MSH2, MSH3, MSH6, MUTYH, NBN, NF1, NHTL1, PALB2,  PDGFRA, PMS2, POLD1, POLE, PTEN, RAD50, RAD51C, RAD51D, RNF43, SDHB, SDHC, SDHD, SMAD4, SMARCA4. STK11, TP53, TSC1, TSC2, and VHL.  The following genes were evaluated for sequence changes only: SDHA and HOXB13 c.251G>A variant only.   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    12/04/2020 Cancer Staging   Staging form: Breast, AJCC 8th Edition - Pathologic stage from 12/04/2020: Stage IA (pT1c, pN1a, cM0, G2, ER+, PR+, HER2-) - Signed by [name], NP on 12/18/2020 Histologic grading system: 3 grade system   01/22/2021 -  Chemotherapy    Patient is on Treatment Plan: BREAST ADJUVANT DOSE DENSE AC Q14D / PACLITAXEL Q7D        CHIEF COMPLIANT: Cycle 3Day 1Adriamycin and Cytoxan  INTERVAL HISTORY: Jennifer Davis is a 45 y.o. with above-mentioned history of left breastcancerwhounderwentbilateral mastectomies with reconstructionand is currently on adjuvant chemotherapy with Adriamycin and Cytoxan.She presents to the clinic todayfora toxicity check and cycle 3. She did not have any nausea after last cycle of chemotherapy.  The fatigue was mild to moderate but she was able to recover her strength and energy and taste by the end of the week.  She is still working and is able to function and balance her work and chemo.  ALLERGIES:  has No Known Allergies.  MEDICATIONS:  Current Outpatient Medications  Medication Sig Dispense Refill  . acetaminophen (TYLENOL) 500 MG tablet TAKE 1 TABLET (500 MG TOTAL) BY MOUTH EVERY 6 (SIX) HOURS AS NEEDED. FOR USE AFTER SURGERY (Patient taking differently: Take 500 mg by mouth every 6 (six) hours as needed  for mild pain, fever or headache.) 30 tablet 0  . benzonatate (TESSALON) 100 MG capsule TAKE 1 CAPSULE BY MOUTH EVERY 8 HOURS FOR 5 DAYS. 15  capsule 0  . budesonide-formoterol (SYMBICORT) 160-4.5 MCG/ACT inhaler INHALE 2 PUFFS BY MOUTH 2 TIMES A DAY 10.2 g 5  . busPIRone (BUSPAR) 30 MG tablet TAKE 1 TABLET (30 MG TOTAL) BY MOUTH 2 (TWO) TIMES DAILY. 60 tablet 5  . cephALEXin (KEFLEX) 500 MG capsule TAKE 1 CAPSULE (500 MG TOTAL) BY MOUTH 4 (FOUR) TIMES DAILY FOR 3 DAYS. FOR USE AFTER SURGERY 12 capsule 0  . cetirizine (ZYRTEC) 10 MG tablet Take 10 mg by mouth daily.    Marland Kitchen dexamethasone (DECADRON) 4 MG tablet TAKE 1 TABLET (4 MG TOTAL) BY MOUTH DAILY. TAKE 1 TABLET THE DAY AFTER CHEMO AND 1 TABLET 2 DAYS AFTER CHEMO. TAKE WITH FOOD. 8 tablet 0  . diazePAM (VALIUM PO) Take 1 tablet by mouth as needed (muscle spasms).    . diazepam (VALIUM) 2 MG tablet TAKE 1 TABLET (2 MG TOTAL) BY MOUTH EVERY 12 (TWELVE) HOURS AS NEEDED FOR UP TO 15 DAYS FOR MUSCLE SPASMS. 30 tablet 0  . doxycycline (VIBRA-TABS) 100 MG tablet TAKE 1 TABLET (100 MG TOTAL) BY MOUTH 2 (TWO) TIMES DAILY FOR 5 DAYS. 10 tablet 0  . doxycycline (VIBRA-TABS) 100 MG tablet TAKE 1 TABLET BY MOUTH TWICE DAILY FOR 14 DAYS. 28 tablet 0  . doxycycline (VIBRAMYCIN) 100 MG capsule TAKE 1 CAPSULE BY MOUTH 2 TIMES A DAY FOR 5 DAYS 10 capsule 0  . DULoxetine (CYMBALTA) 60 MG capsule TAKE 2 CAPSULES (120 MG TOTAL) BY MOUTH DAILY. 60 capsule 5  . EPINEPHrine (EPIPEN 2-PAK) 0.3 mg/0.3 mL IJ SOAJ injection Inject 0.3 mLs (0.3 mg total) into the muscle once as needed for up to 1 dose (for severe allergic reaction). CAll 911 immediately if you have to use this medicine 1 each 1  . gabapentin (NEURONTIN) 100 MG capsule Take 1 capsule (100 mg total) by mouth 3 (three) times daily as needed. (Patient taking differently: Take 100 mg by mouth 3 (three) times daily as needed (neuropathy).) 90 capsule 1  . HYDROcodone-acetaminophen (NORCO/VICODIN) 5-325 MG tablet TAKE 1 TABLET BY MOUTH EVERY 8 (EIGHT) HOURS AS NEEDED FOR UP TO 5 DAYS FOR SEVERE PAIN. 15 tablet 0  . HYDROcodone-acetaminophen (NORCO/VICODIN)  5-325 MG tablet TAKE 1 TABLET BY MOUTH EVERY 8 (EIGHT) HOURS AS NEEDED FOR UP TO 7 DAYS FOR SEVERE PAIN. FOR USE AFTER SURGERY 21 tablet 0  . ibuprofen (ADVIL) 600 MG tablet TAKE 1 TABLET (600 MG TOTAL) BY MOUTH EVERY 6 (SIX) HOURS AS NEEDED FOR MILD PAIN OR MODERATE PAIN. FOR USE AFTER SURGERY 30 tablet 0  . Investigational dexamethasone 4 MG tablet URCC 16070 Blister Card 2 Take 2 tablets by mouth daily. Take in the morning on Days 2-4. 6 tablet 0  . Investigational olanzapine/placebo 10 MG capsule URCC 16070 Blister Card 2 Take 1 capsule by mouth daily. Take in the morning on Days 2-4. 3 capsule 0  . Investigational prochlorperazine maleate/placebo 10 MG capsule URCC 16070 Blister Card 2 Take 1 capsule by mouth every 8 (eight) hours. Take on Days 1-4. 11 capsule 0  . lidocaine-prilocaine (EMLA) cream APPLY TO AFFECTED AREA ONCE (Patient taking differently: Apply 1 application topically as needed (port access).) 30 g 3  . LORazepam (ATIVAN) 0.5 MG tablet Take 0.5 mg by mouth at bedtime as needed (nausea).    . melatonin 5 MG  TABS Take 5 mg by mouth at bedtime as needed (sleep).    . montelukast (SINGULAIR) 10 MG tablet Take 10 mg by mouth daily.  5  . Multiple Vitamin (MULTIVITAMIN WITH MINERALS) TABS tablet Take 1 tablet by mouth daily.    . Multiple Vitamins-Minerals (MULTIVITAMIN PO) Take 2 tablets by mouth daily. Skin/hair/nail supplement.    . ondansetron (ZOFRAN) 8 MG tablet TAKE 1 TABLET (8 MG TOTAL) BY MOUTH 2 (TWO) TIMES DAILY AS NEEDED. START ON THE THIRD DAY AFTER CHEMOTHERAPY. 30 tablet 1  . ondansetron (ZOFRAN-ODT) 4 MG disintegrating tablet TAKE 1 TABLET (4 MG TOTAL) BY MOUTH EVERY 8 (EIGHT) HOURS AS NEEDED FOR NAUSEA OR VOMITING. 20 tablet 0  . predniSONE (DELTASONE) 50 MG tablet TAKE 1 TABLET (50 MG TOTAL) BY MOUTH DAILY FOR 5 DAYS. (Patient taking differently: TAKE 1 TABLET (50 MG TOTAL) BY MOUTH DAILY FOR 5 DAYS.) 5 tablet 0  . PROAIR HFA 108 (90 Base) MCG/ACT inhaler Inhale 2 puffs  into the lungs as needed for wheezing or shortness of breath.  0  . prochlorperazine (COMPAZINE) 10 MG tablet TAKE 1 TABLET (10 MG TOTAL) BY MOUTH EVERY 6 (SIX) HOURS AS NEEDED (NAUSEA OR VOMITING). 30 tablet 1  . sulfamethoxazole-trimethoprim (BACTRIM DS) 800-160 MG tablet Take 1 tablet by mouth 2 (two) times daily for 10 days. 20 tablet 0  . SYMBICORT 160-4.5 MCG/ACT inhaler Inhale 2 puffs into the lungs 2 (two) times daily.  5  . tamoxifen (NOLVADEX) 10 MG tablet TAKE 1 TABLET (10 MG TOTAL) BY MOUTH DAILY. 30 tablet 0   No current facility-administered medications for this visit.    PHYSICAL EXAMINATION: ECOG PERFORMANCE STATUS: 1 - Symptomatic but completely ambulatory  Vitals:   02/19/21 1151  BP: (!) 146/73  Pulse: (!) 101  Resp: 20  Temp: (!) 97.5 F (36.4 C)  SpO2: 99%   Filed Weights   02/19/21 1151  Weight: 219 lb 9.6 oz (99.6 kg)     LABORATORY DATA:  I have reviewed the data as listed CMP Latest Ref Rng & Units 02/05/2021 01/29/2021 01/22/2021  Glucose 70 - 99 mg/dL 131(H) 104(H) 160(H)  BUN 6 - 20 mg/dL '9 7 9  ' Creatinine 0.44 - 1.00 mg/dL 0.72 0.70 0.78  Sodium 135 - 145 mmol/L 140 139 138  Potassium 3.5 - 5.1 mmol/L 3.4(L) 4.0 3.2(L)  Chloride 98 - 111 mmol/L 106 103 106  CO2 22 - 32 mmol/L '22 24 23  ' Calcium 8.9 - 10.3 mg/dL 8.5(L) 8.5(L) 8.6(L)  Total Protein 6.5 - 8.1 g/dL 6.8 6.6 6.8  Total Bilirubin 0.3 - 1.2 mg/dL <0.2(L) 0.3 0.2(L)  Alkaline Phos 38 - 126 U/L 118 100 82  AST 15 - 41 U/L 12(L) 10(L) 8(L)  ALT 0 - 44 U/L '16 15 10    ' Lab Results  Component Value Date   WBC 20.8 (H) 02/19/2021   HGB 8.8 (L) 02/19/2021   HCT 28.7 (L) 02/19/2021   MCV 83.4 02/19/2021   PLT 255 02/19/2021   NEUTROABS PENDING 02/19/2021    ASSESSMENT & PLAN:  Malignant neoplasm of upper-outer quadrant of left breast in female, estrogen receptor positive ([place]) 09/20/2019: Screening mammogram on 08/28/20 showed the stable bilateral masses, a new 0.9cm mass at the 1  o'clock position in the left breast, and one abnormal left axillary lymph node with 0.6cm cortical thickening. Biopsy showed invasive and in situ ductal carcinoma in the breast and axilla, grade 2, HER-2 equivocal by IHC (2+), ER+ 90%,  PR+ 60%, Ki67 25%. MammaPrint: High risk: Luminal type B, 5-year metastasis free survival with chemo and hormone therapy: 93%, absolute benefit from chemo greater than 12%, average 10-year risk of recurrence untreated: 29%  Treatment plan: 1.Mastectomywith targeted node dissection 2.adjuvant chemotherapy with dose dense Adriamycin and Cytoxan followed by Taxol 3.Adjuvant radiation 4.Followed by adjuvant antiestrogen therapy ------------------------------------------------------------------------------------------------------------- Bilateral mastectomies with reconstruction [name]):  Right breast: no malignancy  Left breast: invasive and in situ ductal carcinoma, 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 -------------------------------------------------------------------------------------------------- Current Treatment: Cycle 3DD Adriamycin and Cytoxan 12/30/20: ECHO EF 65-70% Labs reviewed  Chemo Toxicities: 1.Fatigue 2.Nausea: Improved 3.Anemia: Hemoglobin 8.8: On oral iron therapy monitoring closely.  I discussed with her that we might have to reduce the dosage of her treatment if it continues to get worse on drop below 8. Encouraged her to eat more protein in diet.  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  In her house there are multiple dogs that belonged to herself and her sister.   RTC in 2 weeks forcycle 4    No orders of the defined types were placed in this encounter.  The patient has a good understanding of the overall plan. she agrees with it. she will call with any problems that may develop before the next visit here.  Total time spent: 30 mins including face to face  time and time spent for planning, charting and coordination of care  Rulon Eisenmenger, MD, MPH 02/19/2021  I, Molly Dorshimer, am acting as scribe for Dr. Nicholas Lose.  I have reviewed the above documentation for accuracy and completeness, and I agree with the above.

## 2021-02-18 NOTE — Assessment & Plan Note (Signed)
09/20/2019: Screening mammogram on 08/28/20 showed the stable bilateral masses, a new 0.9cm mass at the 1 o'clock position in the left breast, and one abnormal left axillary lymph node with 0.6cm cortical thickening. 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 MammaPrint: High risk: Luminal type B, 5-year metastasis free survival with chemo and hormone therapy: 93%, absolute benefit from chemo greater than 12%, average 10-year risk of recurrence untreated: 29%  Treatment plan: 1.Mastectomywith targeted node dissection 2.adjuvant chemotherapy with dose dense Adriamycin and Cytoxan followed by Taxol 3.Adjuvant radiation 4.Followed by adjuvant antiestrogen therapy ------------------------------------------------------------------------------------------------------------- Bilateral mastectomies with reconstruction ([place]):  Right breast: no malignancy  Left breast: invasive and in situ ductal carcinoma, 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 -------------------------------------------------------------------------------------------------- Current Treatment: Cycle 3DD Adriamycin and Cytoxan 12/30/20: ECHO EF 65-70% Labs reviewed  Chemo Toxicities: 1.Fatigue 2.Nausea: Improved 3.Anemia: Hemoglobin 9.1: On oral iron therapy Encouraged her to eat more protein in diet.  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  In her house there are multiple dogs that belonged to herself and her sister.   RTC in 2 weeks forcycle 4 

## 2021-02-18 NOTE — Telephone Encounter (Signed)
error 

## 2021-02-18 NOTE — Telephone Encounter (Signed)
Called and spoke to patient to remind her of scheduled appointments on 02/19/21. Also reminded patient to  bring any leftover study medications, cycle diaries and forms with her to appointment.

## 2021-02-19 ENCOUNTER — Inpatient Hospital Stay: Payer: 59

## 2021-02-19 ENCOUNTER — Encounter: Payer: Self-pay | Admitting: Emergency Medicine

## 2021-02-19 ENCOUNTER — Other Ambulatory Visit: Payer: Self-pay

## 2021-02-19 ENCOUNTER — Inpatient Hospital Stay (HOSPITAL_BASED_OUTPATIENT_CLINIC_OR_DEPARTMENT_OTHER): Payer: 59 | Admitting: Hematology and Oncology

## 2021-02-19 VITALS — HR 99

## 2021-02-19 DIAGNOSIS — C50412 Malignant neoplasm of upper-outer quadrant of left female breast: Secondary | ICD-10-CM

## 2021-02-19 DIAGNOSIS — C773 Secondary and unspecified malignant neoplasm of axilla and upper limb lymph nodes: Secondary | ICD-10-CM | POA: Diagnosis not present

## 2021-02-19 DIAGNOSIS — Z5111 Encounter for antineoplastic chemotherapy: Secondary | ICD-10-CM | POA: Diagnosis not present

## 2021-02-19 DIAGNOSIS — Z170 Estrogen receptor positive status [ER+]: Secondary | ICD-10-CM

## 2021-02-19 DIAGNOSIS — D6481 Anemia due to antineoplastic chemotherapy: Secondary | ICD-10-CM | POA: Diagnosis not present

## 2021-02-19 LAB — COMPREHENSIVE METABOLIC PANEL
ALT: 17 U/L (ref 0–44)
AST: 14 U/L — ABNORMAL LOW (ref 15–41)
Albumin: 3.3 g/dL — ABNORMAL LOW (ref 3.5–5.0)
Alkaline Phosphatase: 116 U/L (ref 38–126)
Anion gap: 14 (ref 5–15)
BUN: 13 mg/dL (ref 6–20)
CO2: 21 mmol/L — ABNORMAL LOW (ref 22–32)
Calcium: 8.8 mg/dL — ABNORMAL LOW (ref 8.9–10.3)
Chloride: 106 mmol/L (ref 98–111)
Creatinine, Ser: 0.82 mg/dL (ref 0.44–1.00)
GFR, Estimated: >60 mL/min (ref >60)
Glucose, Bld: 124 mg/dL — ABNORMAL HIGH (ref 70–99)
Potassium: 3.6 mmol/L (ref 3.5–5.1)
Sodium: 141 mmol/L (ref 135–145)
Total Bilirubin: <0.2 mg/dL — ABNORMAL LOW (ref 0.3–1.2)
Total Protein: 6.8 g/dL (ref 6.5–8.1)

## 2021-02-19 LAB — CBC WITH DIFFERENTIAL/PLATELET
Abs Immature Granulocytes: 4.8 10*3/uL — ABNORMAL HIGH (ref 0.00–0.07)
Basophils Absolute: 0.2 10*3/uL — ABNORMAL HIGH (ref 0.0–0.1)
Basophils Relative: 1 %
Eosinophils Absolute: 0 10*3/uL (ref 0.0–0.5)
Eosinophils Relative: 0 %
HCT: 28.7 % — ABNORMAL LOW (ref 36.0–46.0)
Hemoglobin: 8.8 g/dL — ABNORMAL LOW (ref 12.0–15.0)
Immature Granulocytes: 23 %
Lymphocytes Relative: 7 %
Lymphs Abs: 1.5 10*3/uL (ref 0.7–4.0)
MCH: 25.6 pg — ABNORMAL LOW (ref 26.0–34.0)
MCHC: 30.7 g/dL (ref 30.0–36.0)
MCV: 83.4 fL (ref 80.0–100.0)
Monocytes Absolute: 1.2 10*3/uL — ABNORMAL HIGH (ref 0.1–1.0)
Monocytes Relative: 6 %
Neutro Abs: 13 10*3/uL — ABNORMAL HIGH (ref 1.7–7.7)
Neutrophils Relative %: 63 %
Platelets: 255 10*3/uL (ref 150–400)
RBC: 3.44 MIL/uL — ABNORMAL LOW (ref 3.87–5.11)
RDW: 18.5 % — ABNORMAL HIGH (ref 11.5–15.5)
WBC: 20.8 10*3/uL — ABNORMAL HIGH (ref 4.0–10.5)
nRBC: 2.7 % — ABNORMAL HIGH (ref 0.0–0.2)

## 2021-02-19 MED ORDER — DOXORUBICIN HCL CHEMO IV INJECTION 2 MG/ML
60.0000 mg/m2 | Freq: Once | INTRAVENOUS | Status: AC
  Administered 2021-02-19: 114 mg via INTRAVENOUS
  Filled 2021-02-19: qty 57

## 2021-02-19 MED ORDER — SODIUM CHLORIDE 0.9% FLUSH
10.0000 mL | INTRAVENOUS | Status: DC | PRN
  Administered 2021-02-19: 10 mL
  Filled 2021-02-19: qty 10

## 2021-02-19 MED ORDER — PALONOSETRON HCL INJECTION 0.25 MG/5ML
0.2500 mg | Freq: Once | INTRAVENOUS | Status: AC
  Administered 2021-02-19: 0.25 mg via INTRAVENOUS

## 2021-02-19 MED ORDER — SODIUM CHLORIDE 0.9 % IV SOLN
600.0000 mg/m2 | Freq: Once | INTRAVENOUS | Status: AC
  Administered 2021-02-19: 1140 mg via INTRAVENOUS
  Filled 2021-02-19: qty 57

## 2021-02-19 MED ORDER — PALONOSETRON HCL INJECTION 0.25 MG/5ML
INTRAVENOUS | Status: AC
  Filled 2021-02-19: qty 5

## 2021-02-19 MED ORDER — SODIUM CHLORIDE 0.9 % IV SOLN
10.0000 mg | Freq: Once | INTRAVENOUS | Status: AC
  Administered 2021-02-19: 10 mg via INTRAVENOUS
  Filled 2021-02-19: qty 10

## 2021-02-19 MED ORDER — SODIUM CHLORIDE 0.9 % IV SOLN
150.0000 mg | Freq: Once | INTRAVENOUS | Status: AC
  Administered 2021-02-19: 150 mg via INTRAVENOUS
  Filled 2021-02-19: qty 150

## 2021-02-19 MED ORDER — SODIUM CHLORIDE 0.9 % IV SOLN
Freq: Once | INTRAVENOUS | Status: AC
  Filled 2021-02-19: qty 250

## 2021-02-19 MED ORDER — HEPARIN SOD (PORK) LOCK FLUSH 100 UNIT/ML IV SOLN
500.0000 [IU] | Freq: Once | INTRAVENOUS | Status: AC | PRN
  Administered 2021-02-19: 500 [IU]
  Filled 2021-02-19: qty 5

## 2021-02-19 NOTE — Research (Signed)
TRIAL - TREATMENT OF REFRACTORY NAUSEA [id number]     02/19/21  PROs:  Patient presented unaccompanied to the cancer center. She brought with her the paper versions of the PROs for cycle 2.  Patient had completed the  MAS[facility] Antiemesis Tool sections 1 and 2, FACT-G, Medical Symptom Checklist, and Covid Effects surveys online prior to her visit.  The Four-Day Home Record and Medication Diary forms had been started by the patient at home, but were not complete.  Patient completed the remaining fields of these forms today in the clinic exam room based on memory.  Investigational Agent Return: The patient provided the study medication blister pack containing three blue capsules to this research coordinator today at 12:00pm.  These doses were in the locations provided for day 1 noon, day 1 evening, and day 3 evening.  Unused medication was provided to [facility], [facility] in pharmacy for destruction according to protocol.  The [employer] investigation agent return form was completed.  The patient was thanked for her participation in this research study.  Clabe Seal Clinical Research Coordinator I  02/19/21 4:05 PM

## 2021-02-19 NOTE — Patient Instructions (Signed)
  [facility] Discharge Instructions for Patients Receiving Chemotherapy  Today you received the following chemotherapy agents Doxorucin(Adramycin), Cyclophosphamide(Cytoxan)  To help prevent nausea and vomiting after your treatment, we encourage you to take your nausea medication as directed.   If you develop nausea and vomiting that is not controlled by your nausea medication, call the clinic.   BELOW ARE SYMPTOMS THAT SHOULD BE REPORTED IMMEDIATELY:  *FEVER GREATER THAN 100.5 F  *CHILLS WITH OR WITHOUT FEVER  NAUSEA AND VOMITING THAT IS NOT CONTROLLED WITH YOUR NAUSEA MEDICATION  *UNUSUAL SHORTNESS OF BREATH  *UNUSUAL BRUISING OR BLEEDING  TENDERNESS IN MOUTH AND THROAT WITH OR WITHOUT PRESENCE OF ULCERS  *URINARY PROBLEMS  *BOWEL PROBLEMS  UNUSUAL RASH Items with * indicate a potential emergency and should be followed up as soon as possible.  Feel free to call the clinic should you have any questions or concerns. The clinic phone number is [phone].  Please show the [place] at check-in to the Emergency Department and triage nurse.

## 2021-02-21 ENCOUNTER — Other Ambulatory Visit: Payer: Self-pay

## 2021-02-21 ENCOUNTER — Inpatient Hospital Stay: Payer: 59

## 2021-02-21 DIAGNOSIS — Z5111 Encounter for antineoplastic chemotherapy: Secondary | ICD-10-CM | POA: Diagnosis not present

## 2021-02-21 DIAGNOSIS — Z170 Estrogen receptor positive status [ER+]: Secondary | ICD-10-CM

## 2021-02-21 DIAGNOSIS — C50412 Malignant neoplasm of upper-outer quadrant of left female breast: Secondary | ICD-10-CM | POA: Diagnosis not present

## 2021-02-21 DIAGNOSIS — D6481 Anemia due to antineoplastic chemotherapy: Secondary | ICD-10-CM | POA: Diagnosis not present

## 2021-02-21 DIAGNOSIS — C773 Secondary and unspecified malignant neoplasm of axilla and upper limb lymph nodes: Secondary | ICD-10-CM | POA: Diagnosis not present

## 2021-02-21 MED ORDER — PEGFILGRASTIM-BMEZ 6 MG/0.6ML ~~LOC~~ SOSY
6.0000 mg | PREFILLED_SYRINGE | Freq: Once | SUBCUTANEOUS | Status: AC
  Administered 2021-02-21: 6 mg via SUBCUTANEOUS

## 2021-02-21 NOTE — Patient Instructions (Signed)
Pegfilgrastim injection What is this medicine? PEGFILGRASTIM (PEG fil gra stim) is a long-acting granulocyte colony-stimulating factor that stimulates the growth of neutrophils, a type of white blood cell important in the body's fight against infection. It is used to reduce the incidence of fever and infection in patients with certain types of cancer who are receiving chemotherapy that affects the bone marrow, and to increase survival after being exposed to high doses of radiation. This medicine may be used for other purposes; ask your health care provider or pharmacist if you have questions. COMMON BRAND NAME(S): Fulphila, Neulasta, Nyvepria, UDENYCA, Ziextenzo What should I tell my health care provider before I take this medicine? They need to know if you have any of these conditions:  kidney disease  latex allergy  ongoing radiation therapy  sickle cell disease  skin reactions to acrylic adhesives (On-Body Injector only)  an unusual or allergic reaction to pegfilgrastim, filgrastim, other medicines, foods, dyes, or preservatives  pregnant or trying to get pregnant  breast-feeding How should I use this medicine? This medicine is for injection under the skin. If you get this medicine at home, you will be taught how to prepare and give the pre-filled syringe or how to use the On-body Injector. Refer to the patient Instructions for Use for detailed instructions. Use exactly as directed. Tell your healthcare provider immediately if you suspect that the On-body Injector may not have performed as intended or if you suspect the use of the On-body Injector resulted in a missed or partial dose. It is important that you put your used needles and syringes in a special sharps container. Do not put them in a trash can. If you do not have a sharps container, call your pharmacist or healthcare provider to get one. Talk to your pediatrician regarding the use of this medicine in children. While this drug  may be prescribed for selected conditions, precautions do apply. Overdosage: If you think you have taken too much of this medicine contact a poison control center or emergency room at once. NOTE: This medicine is only for you. Do not share this medicine with others. What if I miss a dose? It is important not to miss your dose. Call your doctor or health care professional if you miss your dose. If you miss a dose due to an On-body Injector failure or leakage, a new dose should be administered as soon as possible using a single prefilled syringe for manual use. What may interact with this medicine? Interactions have not been studied. This list may not describe all possible interactions. Give your health care provider a list of all the medicines, herbs, non-prescription drugs, or dietary supplements you use. Also tell them if you smoke, drink alcohol, or use illegal drugs. Some items may interact with your medicine. What should I watch for while using this medicine? Your condition will be monitored carefully while you are receiving this medicine. You may need blood work done while you are taking this medicine. Talk to your health care provider about your risk of cancer. You may be more at risk for certain types of cancer if you take this medicine. If you are going to need a MRI, CT scan, or other procedure, tell your doctor that you are using this medicine (On-Body Injector only). What side effects may I notice from receiving this medicine? Side effects that you should report to your doctor or health care professional as soon as possible:  allergic reactions (skin rash, itching or hives, swelling of   the face, lips, or tongue)  back pain  dizziness  fever  pain, redness, or irritation at site where injected  pinpoint red spots on the skin  red or dark-brown urine  shortness of breath or breathing problems  stomach or side pain, or pain at the shoulder  swelling  tiredness  trouble  passing urine or change in the amount of urine  unusual bruising or bleeding Side effects that usually do not require medical attention (report to your doctor or health care professional if they continue or are bothersome):  bone pain  muscle pain This list may not describe all possible side effects. Call your doctor for medical advice about side effects. You may report side effects to FDA at 1-800-FDA-1088. Where should I keep my medicine? Keep out of the reach of children. If you are using this medicine at home, you will be instructed on how to store it. Throw away any unused medicine after the expiration date on the label. NOTE: This sheet is a summary. It may not cover all possible information. If you have questions about this medicine, talk to your doctor, pharmacist, or health care provider.  2021 Elsevier/Gold Standard (2019-11-17 13:20:51)  

## 2021-02-28 ENCOUNTER — Other Ambulatory Visit: Payer: Self-pay

## 2021-02-28 ENCOUNTER — Other Ambulatory Visit (HOSPITAL_COMMUNITY): Payer: Self-pay

## 2021-02-28 ENCOUNTER — Encounter: Payer: Self-pay | Admitting: Plastic Surgery

## 2021-02-28 ENCOUNTER — Ambulatory Visit (INDEPENDENT_AMBULATORY_CARE_PROVIDER_SITE_OTHER): Payer: 59 | Admitting: Plastic Surgery

## 2021-02-28 DIAGNOSIS — Z9013 Acquired absence of bilateral breasts and nipples: Secondary | ICD-10-CM

## 2021-02-28 DIAGNOSIS — Z9010 Acquired absence of unspecified breast and nipple: Secondary | ICD-10-CM | POA: Insufficient documentation

## 2021-02-28 DIAGNOSIS — C50412 Malignant neoplasm of upper-outer quadrant of left female breast: Secondary | ICD-10-CM

## 2021-02-28 DIAGNOSIS — Z170 Estrogen receptor positive status [ER+]: Secondary | ICD-10-CM

## 2021-02-28 MED ORDER — SULFAMETHOXAZOLE-TRIMETHOPRIM 800-160 MG PO TABS
1.0000 | ORAL_TABLET | Freq: Two times a day (BID) | ORAL | 0 refills | Status: AC
  Filled 2021-02-28: qty 14, 7d supply, fill #0

## 2021-02-28 NOTE — Progress Notes (Signed)
   Subjective:    Patient ID: Jennifer Davis, female    DOB: 1976/08/30, 45 y.o.   MRN: [id number]  The patient is a 45 yrs old female here for follow up on her bilateral breast reconstruction.  She had her expanders removed and implants placed 01/04/21.  She has a seroma on the right breast.  The left looks fine.  There is a little bit of redness.  She was given antibiotic last appointment seem to help.   Review of Systems  Constitutional: Negative.   Eyes: Negative.   Respiratory: Negative.   Cardiovascular: Negative.   Gastrointestinal: Negative.   Genitourinary: Negative.        Objective:   Physical Exam Vitals and nursing note reviewed.  Constitutional:      Appearance: Normal appearance.  HENT:     Head: Normocephalic and atraumatic.  Cardiovascular:     Rate and Rhythm: Normal rate.  Pulmonary:     Effort: Pulmonary effort is normal.  Neurological:     General: No focal deficit present.     Mental Status: She is alert and oriented to person, place, and time.  Psychiatric:        Mood and Affect: Mood normal.        Behavior: Behavior normal.         Assessment & Plan:     ICD-10-CM   1. Malignant neoplasm of upper-outer quadrant of left breast in female, estrogen receptor positive ([place])  C50.412    Z17.0   2. S/P mastectomy, bilateral  Z90.13   3. Acquired absence of both breasts  Z90.13     We placed injectable saline in the Expander using a sterile technique: Right: 30 cc for a total of 280 / 535 cc Left: 0 cc for a total of 350 / 535 cc I removed 20 cc of serous fluid from the right breast.  I then placed 30 cc in the expander.  My hope is that the expander will take the place of the seroma trying to develop.  I will also send it another dose of antibiotics for her.  I would like to see her back in 1 to 2 weeks.  Pictures were obtained of the patient and placed in the chart with the patient's or guardian's permission.

## 2021-03-03 ENCOUNTER — Telehealth: Payer: Self-pay

## 2021-03-03 ENCOUNTER — Other Ambulatory Visit (HOSPITAL_COMMUNITY): Payer: Self-pay

## 2021-03-03 MED FILL — Duloxetine HCl Enteric Coated Pellets Cap 60 MG (Base Eq): ORAL | 30 days supply | Qty: 60 | Fill #0 | Status: AC

## 2021-03-03 NOTE — Telephone Encounter (Signed)
Patient called to say that she came in last Thursday and we drained fluid from her right side.  Patient said that today it is more red and there are two bubbles that look like they have fluid in them.  Patient said that she thinks it may need to be drained again.  Please call.

## 2021-03-03 NOTE — Telephone Encounter (Cosign Needed)
Schedule appointment with [facility] 03/04/21 at 8:00. Patient is aware of date and time.

## 2021-03-04 ENCOUNTER — Encounter: Payer: Self-pay | Admitting: *Deleted

## 2021-03-04 ENCOUNTER — Encounter: Payer: Self-pay | Admitting: Surgical

## 2021-03-04 ENCOUNTER — Ambulatory Visit (INDEPENDENT_AMBULATORY_CARE_PROVIDER_SITE_OTHER): Payer: 59 | Admitting: Surgical

## 2021-03-04 ENCOUNTER — Telehealth: Payer: Self-pay | Admitting: Hematology and Oncology

## 2021-03-04 ENCOUNTER — Other Ambulatory Visit: Payer: Self-pay

## 2021-03-04 VITALS — BP 101/66 | HR 139

## 2021-03-04 DIAGNOSIS — C50412 Malignant neoplasm of upper-outer quadrant of left female breast: Secondary | ICD-10-CM | POA: Diagnosis not present

## 2021-03-04 DIAGNOSIS — C773 Secondary and unspecified malignant neoplasm of axilla and upper limb lymph nodes: Secondary | ICD-10-CM | POA: Diagnosis not present

## 2021-03-04 DIAGNOSIS — Z170 Estrogen receptor positive status [ER+]: Secondary | ICD-10-CM | POA: Diagnosis not present

## 2021-03-04 DIAGNOSIS — D6481 Anemia due to antineoplastic chemotherapy: Secondary | ICD-10-CM | POA: Diagnosis not present

## 2021-03-04 DIAGNOSIS — Z5111 Encounter for antineoplastic chemotherapy: Secondary | ICD-10-CM | POA: Diagnosis not present

## 2021-03-04 DIAGNOSIS — Z9013 Acquired absence of bilateral breasts and nipples: Secondary | ICD-10-CM

## 2021-03-04 NOTE — Assessment & Plan Note (Signed)
09/20/2019: Screening mammogram on 08/28/20 showed the stable bilateral masses, a new 0.9cm mass at the 1 o'clock position in the left breast, and one abnormal left axillary lymph node with 0.6cm cortical thickening. 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 MammaPrint: High risk: Luminal type B, 5-year metastasis free survival with chemo and hormone therapy: 93%, absolute benefit from chemo greater than 12%, average 10-year risk of recurrence untreated: 29%  Treatment plan: 1.Mastectomywith targeted node dissection 2.adjuvant chemotherapy with dose dense Adriamycin and Cytoxan followed by Taxol 3.Adjuvant radiation 4.Followed by adjuvant antiestrogen therapy ------------------------------------------------------------------------------------------------------------- Bilateral mastectomies with reconstruction ([place]):  Right breast: no malignancy  Left breast: invasive and in situ ductal carcinoma, 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 -------------------------------------------------------------------------------------------------- Current Treatment: Cycle 3DD Adriamycin and Cytoxan 12/30/20: ECHO EF 65-70% Labs reviewed  Chemo Toxicities: 1.Fatigue 2.Nausea: Improved 3.Anemia: Hemoglobin 9.1: On oral iron therapy Encouraged her to eat more protein in diet.  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  In her house there are multiple dogs that belonged to herself and her sister.   RTC in 2 weeks forcycle 4 

## 2021-03-04 NOTE — Telephone Encounter (Signed)
Scheduled appt per 4/26 sch msg. Pt aware.  

## 2021-03-04 NOTE — Progress Notes (Signed)
Patient Care Team: Wenda Low, MD as PCP - General (Internal Medicine) Rockwell Germany, RN as Oncology Nurse Navigator Mauro Kaufmann, RN as Oncology Nurse Navigator Coralie Keens, MD as Consulting Physician (General Surgery) Nicholas Lose, MD as Consulting Physician (Hematology and Oncology) Gery Pray, MD as Consulting Physician (Radiation Oncology) Gwyndolyn Kaufman, RN as Registered Nurse  DIAGNOSIS:    ICD-10-CM   1. Malignant neoplasm of upper-outer quadrant of left breast in female, estrogen receptor positive ([place])  C50.412    Z17.0     SUMMARY OF ONCOLOGIC HISTORY: Oncology History  Malignant neoplasm of upper-outer quadrant of left breast in female, estrogen receptor positive ([place])  09/19/2020 Initial Diagnosis   Mammogram in 07/2018 showed probably benign bilateral breast masses that were stable on her last mammogram on 07/26/19. Mammogram on 08/28/20 showed the stable bilateral masses, a new 0.9cm mass at the 1 o'clock position in the left breast, and one abnormal left axillary lymph node with 0.6cm cortical thickening. 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    10/02/2020 Genetic Testing   Negative genetic testing: no pathogenic variants detected in [facility] Common-Hereditary Cancers Panel.  Variants of uncertain significance detected in APC c.6918T>A (p.Asp2306Glu) and POLE  c.2612G>C (p.Ser871Thr).  The report date is November 24. 2021.   The Common Hereditary Cancers Panel offered by [facility] includes sequencing and/or deletion duplication testing of the following 48 genes: APC, ATM, AXIN2, BARD1, BMPR1A, BRCA1, BRCA2, BRIP1, CDH1, CDK4, CDKN2A (p14ARF), CDKN2A (p16INK4a), CHEK2, CTNNA1, DICER1, EPCAM (Deletion/duplication testing only), GREM1 (promoter region deletion/duplication testing only), KIT, MEN1, MLH1, MSH2, MSH3, MSH6, MUTYH, NBN, NF1, NHTL1, PALB2,  PDGFRA, PMS2, POLD1, POLE, PTEN, RAD50, RAD51C, RAD51D, RNF43, SDHB, SDHC, SDHD, SMAD4, SMARCA4. STK11, TP53, TSC1, TSC2, and VHL.  The following genes were evaluated for sequence changes only: SDHA and HOXB13 c.251G>A variant only.   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    12/04/2020 Cancer Staging   Staging form: Breast, AJCC 8th Edition - Pathologic stage from 12/04/2020: Stage IA (pT1c, pN1a, cM0, G2, ER+, PR+, HER2-) - Signed by [name], NP on 12/18/2020 Histologic grading system: 3 grade system   01/22/2021 -  Chemotherapy    Patient is on Treatment Plan: BREAST ADJUVANT DOSE DENSE AC Q14D / PACLITAXEL Q7D        CHIEF COMPLIANT: Cycle4Day1Adriamycin and Cytoxan  INTERVAL HISTORY: Jennifer Davis GRADE is a 45 y.o. with above-mentioned history of left breastcancerwhounderwentbilateral mastectomies with reconstructionand is currently on adjuvant chemotherapy with Adriamycin and Cytoxan.She presents to the clinic todayfora toxicity checkandcycle 4.    ALLERGIES:  has No Known Allergies.  MEDICATIONS:  Current Outpatient Medications  Medication Sig Dispense Refill  . acetaminophen (TYLENOL) 500 MG tablet TAKE 1 TABLET (500 MG TOTAL) BY MOUTH EVERY 6 (SIX) HOURS AS NEEDED. FOR USE AFTER SURGERY (Patient taking differently: Take 500 mg by mouth every 6 (six) hours as needed for mild pain, fever or headache.) 30 tablet 0  . budesonide-formoterol (SYMBICORT) 160-4.5 MCG/ACT inhaler INHALE 2 PUFFS BY MOUTH 2 TIMES A DAY 10.2 g 5  . busPIRone (BUSPAR) 30 MG tablet TAKE 1 TABLET (30 MG TOTAL) BY MOUTH 2 (TWO) TIMES DAILY. 60 tablet 5  . cetirizine (ZYRTEC)  10 MG tablet Take 10 mg by mouth daily.    Marland Kitchen dexamethasone (DECADRON) 4 MG tablet TAKE 1 TABLET (4 MG TOTAL) BY MOUTH  DAILY. TAKE 1 TABLET THE DAY AFTER CHEMO AND 1 TABLET 2 DAYS AFTER CHEMO. TAKE WITH FOOD. 8 tablet 0  . diazepam (VALIUM) 2 MG tablet TAKE 1 TABLET (2 MG TOTAL) BY MOUTH EVERY 12 (TWELVE) HOURS AS NEEDED FOR UP TO 15 DAYS FOR MUSCLE SPASMS. 30 tablet 0  . DULoxetine (CYMBALTA) 60 MG capsule TAKE 2 CAPSULES (120 MG TOTAL) BY MOUTH DAILY. 60 capsule 5  . EPINEPHrine (EPIPEN 2-PAK) 0.3 mg/0.3 mL IJ SOAJ injection Inject 0.3 mLs (0.3 mg total) into the muscle once as needed for up to 1 dose (for severe allergic reaction). CAll 911 immediately if you have to use this medicine 1 each 1  . gabapentin (NEURONTIN) 100 MG capsule Take 1 capsule (100 mg total) by mouth 3 (three) times daily as needed. (Patient taking differently: Take 100 mg by mouth 3 (three) times daily as needed (neuropathy).) 90 capsule 1  . HYDROcodone-acetaminophen (NORCO/VICODIN) 5-325 MG tablet TAKE 1 TABLET BY MOUTH EVERY 8 (EIGHT) HOURS AS NEEDED FOR UP TO 5 DAYS FOR SEVERE PAIN. 15 tablet 0  . ibuprofen (ADVIL) 600 MG tablet TAKE 1 TABLET (600 MG TOTAL) BY MOUTH EVERY 6 (SIX) HOURS AS NEEDED FOR MILD PAIN OR MODERATE PAIN. FOR USE AFTER SURGERY 30 tablet 0  . lidocaine-prilocaine (EMLA) cream APPLY TO AFFECTED AREA ONCE (Patient taking differently: Apply 1 application topically as needed (port access).) 30 g 3  . LORazepam (ATIVAN) 0.5 MG tablet Take 0.5 mg by mouth at bedtime as needed (nausea).    . melatonin 5 MG TABS Take 5 mg by mouth at bedtime as needed (sleep).    . montelukast (SINGULAIR) 10 MG tablet Take 10 mg by mouth daily.  5  . Multiple Vitamin (MULTIVITAMIN WITH MINERALS) TABS tablet Take 1 tablet by mouth daily.    . Multiple Vitamins-Minerals (MULTIVITAMIN PO) Take 2 tablets by mouth daily. Skin/hair/nail supplement.    . ondansetron (ZOFRAN-ODT) 4 MG disintegrating tablet TAKE 1 TABLET (4 MG TOTAL) BY MOUTH EVERY 8 (EIGHT) HOURS AS NEEDED FOR NAUSEA OR VOMITING. (Patient not taking: No sig reported) 20 tablet 0  .  PROAIR HFA 108 (90 Base) MCG/ACT inhaler Inhale 2 puffs into the lungs as needed for wheezing or shortness of breath.  0  . prochlorperazine (COMPAZINE) 10 MG tablet TAKE 1 TABLET (10 MG TOTAL) BY MOUTH EVERY 6 (SIX) HOURS AS NEEDED (NAUSEA OR VOMITING). 30 tablet 1  . sulfamethoxazole-trimethoprim (BACTRIM DS) 800-160 MG tablet Take 1 tablet by mouth 2 (two) times daily for 7 days. 14 tablet 0  . SYMBICORT 160-4.5 MCG/ACT inhaler Inhale 2 puffs into the lungs 2 (two) times daily.  5  . tamoxifen (NOLVADEX) 10 MG tablet TAKE 1 TABLET (10 MG TOTAL) BY MOUTH DAILY. 30 tablet 0   No current facility-administered medications for this visit.    PHYSICAL EXAMINATION: ECOG PERFORMANCE STATUS: 1 - Symptomatic but completely ambulatory  There were no vitals filed for this visit. There were no vitals filed for this visit.   LABORATORY DATA:  I have reviewed the data as listed CMP Latest Ref Rng & Units 02/19/2021 02/05/2021 01/29/2021  Glucose 70 - 99 mg/dL 124(H) 131(H) 104(H)  BUN 6 - 20 mg/dL _0 Creatinine 0.44 - 1.00 mg/dL 0.82 0.72 0.70  Sodium 135 - 145 mmol/L 141 140 139  Potassium 3.5 - 5.1 mmol/L 3.6 3.4(L) 4.0  Chloride 98 - 111 mmol/L 106 106 103  CO2 22 - 32 mmol/L 21(L) 22 24  Calcium 8.9 - 10.3 mg/dL 8.8(L) 8.5(L) 8.5(L)  Total Protein 6.5 - 8.1 g/dL 6.8 6.8 6.6  Total Bilirubin 0.3 - 1.2 mg/dL <0.2(L) <0.2(L) 0.3  Alkaline Phos 38 - 126 U/L 116 118 100  AST 15 - 41 U/L 14(L) 12(L) 10(L)  ALT 0 - 44 U/L _0 Lab Results  Component Value Date   WBC 13.9 (H) 03/05/2021   HGB 8.5 (L) 03/05/2021   HCT 27.7 (L) 03/05/2021   MCV 83.9 03/05/2021   PLT 340 03/05/2021   NEUTROABS PENDING 03/05/2021    ASSESSMENT & PLAN:  Malignant neoplasm of upper-outer quadrant of left breast in female, estrogen receptor positive ([place]) 09/20/2019: Screening mammogram on 08/28/20 showed the stable bilateral masses, a new 0.9cm mass at the 1 o'clock position in the left breast, and  one abnormal left axillary lymph node with 0.6cm cortical thickening. 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 MammaPrint: High risk: Luminal type B, 5-year metastasis free survival with chemo and hormone therapy: 93%, absolute benefit from chemo greater than 12%, average 10-year risk of recurrence untreated: 29%  Treatment plan: 1.Mastectomywith targeted node dissection 2.adjuvant chemotherapy with dose dense Adriamycin and Cytoxan followed by Taxol 3.Adjuvant radiation 4.Followed by adjuvant antiestrogen therapy ------------------------------------------------------------------------------------------------------------- Bilateral mastectomies with reconstruction [name]):  Right breast: no malignancy  Left breast: invasive and in situ ductal carcinoma, 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 -------------------------------------------------------------------------------------------------- Current Treatment: Cycle 4DD Adriamycin and Cytoxan 12/30/20: ECHO EF 65-70% Labs reviewed  Chemo Toxicities: 1.Fatigue 2.Nausea: Improved 3.Anemia: Hemoglobin 8.5: On oral iron therapy 4.  Constipation: Patient is taking more fiber in her diet and its helped her.  Encouraged her to eat more protein in diet.    RTC in 2 weeks forcycle 1 Taxol     No orders of the defined types were placed in this encounter.  The patient has a good understanding of the overall plan. she agrees with it. she will call with any problems that may develop before the next visit here.  Total time spent: 30 mins including face to face time and time spent for planning, charting and coordination of care  Rulon Eisenmenger, MD, MPH 03/05/2021  I, Molly Dorshimer, am acting as scribe for Dr. Nicholas Lose.  I have reviewed the above documentation for  accuracy and completeness, and I agree with the above.

## 2021-03-04 NOTE — Progress Notes (Signed)
Patient is a 45 year old female here for follow-up on her bilateral breast reconstruction.  She had a right tissue expander replaced on 01/04/2021.  She had the initial expanders placed on 12/04/2020.  She was seen in the office last week and had some fluid drained from her right breast.   She is here for reevaluation of her right breast after noticing increased swelling and redness.  She is currently on Bactrim.  She is not having any infectious symptoms.  Chaperone present on exam On exam right breast incision is intact.  She has some erythema of the right breast.  It is not tender to palpation.  No fluctuance is noted.  Left breast incision is intact and well-healed.  No erythema of the left breast.  Recommend continue her compressive garment.  We were able to aspirate 15 cc of serosanguineous fluid from the right breast.  We also removed 100 cc of saline from the right breast expander.   She now currently has 180/535 cc in the right breast expander. Left: Total of 350/535 cc  We will set up a televisit for Thursday, 03/06/2021 to discuss how things are going.  We will also send the fluid that was aspirated from the right breast for culture.  Continue taking Bactrim.

## 2021-03-05 ENCOUNTER — Inpatient Hospital Stay: Payer: 59

## 2021-03-05 ENCOUNTER — Inpatient Hospital Stay (HOSPITAL_BASED_OUTPATIENT_CLINIC_OR_DEPARTMENT_OTHER): Payer: 59 | Admitting: Hematology and Oncology

## 2021-03-05 DIAGNOSIS — Z170 Estrogen receptor positive status [ER+]: Secondary | ICD-10-CM

## 2021-03-05 DIAGNOSIS — D6481 Anemia due to antineoplastic chemotherapy: Secondary | ICD-10-CM | POA: Diagnosis not present

## 2021-03-05 DIAGNOSIS — C50412 Malignant neoplasm of upper-outer quadrant of left female breast: Secondary | ICD-10-CM | POA: Diagnosis not present

## 2021-03-05 DIAGNOSIS — C773 Secondary and unspecified malignant neoplasm of axilla and upper limb lymph nodes: Secondary | ICD-10-CM | POA: Diagnosis not present

## 2021-03-05 DIAGNOSIS — Z5111 Encounter for antineoplastic chemotherapy: Secondary | ICD-10-CM | POA: Diagnosis not present

## 2021-03-05 LAB — CMP (CANCER CENTER ONLY)
ALT: 24 U/L (ref 0–44)
AST: 19 U/L (ref 15–41)
Albumin: 3.4 g/dL — ABNORMAL LOW (ref 3.5–5.0)
Alkaline Phosphatase: 109 U/L (ref 38–126)
Anion gap: 11 (ref 5–15)
BUN: 7 mg/dL (ref 6–20)
CO2: 23 mmol/L (ref 22–32)
Calcium: 8.7 mg/dL — ABNORMAL LOW (ref 8.9–10.3)
Chloride: 107 mmol/L (ref 98–111)
Creatinine: 0.78 mg/dL (ref 0.44–1.00)
GFR, Estimated: >60 mL/min (ref >60)
Glucose, Bld: 139 mg/dL — ABNORMAL HIGH (ref 70–99)
Potassium: 3.6 mmol/L (ref 3.5–5.1)
Sodium: 141 mmol/L (ref 135–145)
Total Bilirubin: <0.2 mg/dL — ABNORMAL LOW (ref 0.3–1.2)
Total Protein: 6.6 g/dL (ref 6.5–8.1)

## 2021-03-05 LAB — CBC WITH DIFFERENTIAL (CANCER CENTER ONLY)
Abs Immature Granulocytes: 3.44 10*3/uL — ABNORMAL HIGH (ref 0.00–0.07)
Basophils Absolute: 0.1 10*3/uL (ref 0.0–0.1)
Basophils Relative: 1 %
Eosinophils Absolute: 0 10*3/uL (ref 0.0–0.5)
Eosinophils Relative: 0 %
HCT: 27.7 % — ABNORMAL LOW (ref 36.0–46.0)
Hemoglobin: 8.5 g/dL — ABNORMAL LOW (ref 12.0–15.0)
Immature Granulocytes: 25 %
Lymphocytes Relative: 8 %
Lymphs Abs: 1.1 10*3/uL (ref 0.7–4.0)
MCH: 25.8 pg — ABNORMAL LOW (ref 26.0–34.0)
MCHC: 30.7 g/dL (ref 30.0–36.0)
MCV: 83.9 fL (ref 80.0–100.0)
Monocytes Absolute: 0.9 10*3/uL (ref 0.1–1.0)
Monocytes Relative: 6 %
Neutro Abs: 8.4 10*3/uL — ABNORMAL HIGH (ref 1.7–7.7)
Neutrophils Relative %: 60 %
Platelet Count: 340 10*3/uL (ref 150–400)
RBC: 3.3 MIL/uL — ABNORMAL LOW (ref 3.87–5.11)
RDW: 20.7 % — ABNORMAL HIGH (ref 11.5–15.5)
WBC Count: 13.9 10*3/uL — ABNORMAL HIGH (ref 4.0–10.5)
nRBC: 2.7 % — ABNORMAL HIGH (ref 0.0–0.2)

## 2021-03-05 MED ORDER — SODIUM CHLORIDE 0.9 % IV SOLN
150.0000 mg | Freq: Once | INTRAVENOUS | Status: AC
  Administered 2021-03-05: 150 mg via INTRAVENOUS
  Filled 2021-03-05: qty 150

## 2021-03-05 MED ORDER — SODIUM CHLORIDE 0.9 % IV SOLN
Freq: Once | INTRAVENOUS | Status: AC
Start: 2021-03-05 — End: 2021-03-05
  Filled 2021-03-05: qty 250

## 2021-03-05 MED ORDER — SODIUM CHLORIDE 0.9% FLUSH
10.0000 mL | INTRAVENOUS | Status: DC | PRN
  Administered 2021-03-05: 10 mL
  Filled 2021-03-05: qty 10

## 2021-03-05 MED ORDER — PALONOSETRON HCL INJECTION 0.25 MG/5ML
0.2500 mg | Freq: Once | INTRAVENOUS | Status: AC
Start: 2021-03-05 — End: 2021-03-05
  Administered 2021-03-05: 0.25 mg via INTRAVENOUS

## 2021-03-05 MED ORDER — HEPARIN SOD (PORK) LOCK FLUSH 100 UNIT/ML IV SOLN
500.0000 [IU] | Freq: Once | INTRAVENOUS | Status: AC | PRN
  Administered 2021-03-05: 500 [IU]
  Filled 2021-03-05: qty 5

## 2021-03-05 MED ORDER — DEXAMETHASONE SODIUM PHOSPHATE 100 MG/10ML IJ SOLN
10.0000 mg | Freq: Once | INTRAMUSCULAR | Status: AC
  Administered 2021-03-05: 10 mg via INTRAVENOUS
  Filled 2021-03-05: qty 10

## 2021-03-05 MED ORDER — DOXORUBICIN HCL CHEMO IV INJECTION 2 MG/ML
60.0000 mg/m2 | Freq: Once | INTRAVENOUS | Status: AC
  Administered 2021-03-05: 114 mg via INTRAVENOUS
  Filled 2021-03-05: qty 57

## 2021-03-05 MED ORDER — SODIUM CHLORIDE 0.9 % IV SOLN
600.0000 mg/m2 | Freq: Once | INTRAVENOUS | Status: AC
  Administered 2021-03-05: 1140 mg via INTRAVENOUS
  Filled 2021-03-05: qty 57

## 2021-03-05 MED ORDER — PALONOSETRON HCL INJECTION 0.25 MG/5ML
INTRAVENOUS | Status: AC
  Filled 2021-03-05: qty 5

## 2021-03-05 NOTE — Patient Instructions (Signed)
[facility] MEDICAL [facility]COL[facility]  Discharge Instructions: Thank you for choosing [facility] to provide your oncology and hematology care.   If you have a lab appointment with the Cancer Center, please go directly to the Cancer Center and check in at the registration area.   Wear comfortable clothing and clothing appropriate for easy access to any Portacath or PICC line.   We strive to give you quality time with your provider. You may need to reschedule your appointment if you arrive late (15 or more minutes).  Arriving late affects you and other patients whose appointments are after yours.  Also, if you miss three or more appointments without notifying the office, you may be dismissed from the clinic at the provider's discretion.      For prescription refill requests, have your pharmacy contact our office and allow 72 hours for refills to be completed.    Today you received the following chemotherapy and/or immunotherapy agents doxorubicin, cyclophosphamide      To help prevent nausea and vomiting after your treatment, we encourage you to take your nausea medication as directed.  BELOW ARE SYMPTOMS THAT SHOULD BE REPORTED IMMEDIATELY: *FEVER GREATER THAN 100.4 F (38 C) OR HIGHER *CHILLS OR SWEATING *NAUSEA AND VOMITING THAT IS NOT CONTROLLED WITH YOUR NAUSEA MEDICATION *UNUSUAL SHORTNESS OF BREATH *UNUSUAL BRUISING OR BLEEDING *URINARY PROBLEMS (pain or burning when urinating, or frequent urination) *BOWEL PROBLEMS (unusual diarrhea, constipation, pain near the anus) TENDERNESS IN MOUTH AND THROAT WITH OR WITHOUT PRESENCE OF ULCERS (sore throat, sores in mouth, or a toothache) UNUSUAL RASH, SWELLING OR PAIN  UNUSUAL VAGINAL DISCHARGE OR ITCHING   Items with * indicate a potential emergency and should be followed up as soon as possible or go to the Emergency Department if any problems should occur.  Please show the CHEMOTHERAPY ALERT CARD or IMMUNOTHERAPY ALERT  CARD at check-in to the Emergency Department and triage nurse.  Should you have questions after your visit or need to cancel or reschedule your appointment, please contact [facility]  Dept: [phone]  and follow the prompts.  Office hours are 8:00 a.m. to 4:30 p.m. Monday - Friday. Please note that voicemails left after 4:00 p.m. may not be returned until the following business day.  We are closed weekends and major holidays. You have access to a nurse at all times for urgent questions. Please call the main number to the clinic Dept: [phone] and follow the prompts.   For any non-urgent questions, you may also contact your provider using MyChart. We now offer e-Visits for anyone 18 and older to request care online for non-urgent symptoms. For details visit [email or web address].   Also download the MyChart app! Go to the app store, search "MyChart", open the app, select [facility], and log in with your MyChart username and password.  Due to Covid, a mask is required upon entering the hospital/clinic. If you do not have a mask, one will be given to you upon arrival. For doctor visits, patients may have 1 support person aged 18 or older with them. For treatment visits, patients cannot have anyone with them due to current Covid guidelines and our immunocompromised population.   

## 2021-03-06 ENCOUNTER — Other Ambulatory Visit: Payer: Self-pay

## 2021-03-06 ENCOUNTER — Telehealth (INDEPENDENT_AMBULATORY_CARE_PROVIDER_SITE_OTHER): Payer: 59 | Admitting: Surgical

## 2021-03-06 DIAGNOSIS — C50412 Malignant neoplasm of upper-outer quadrant of left female breast: Secondary | ICD-10-CM

## 2021-03-06 DIAGNOSIS — Z170 Estrogen receptor positive status [ER+]: Secondary | ICD-10-CM

## 2021-03-06 DIAGNOSIS — Z9013 Acquired absence of bilateral breasts and nipples: Secondary | ICD-10-CM

## 2021-03-06 NOTE — Progress Notes (Signed)
Patient is a 45 year old female who presents for follow-up on her bilateral breast reconstruction.  We plan for a telephone visit today to discuss how she was doing after some redness and swelling of her right breast.  She reports that she feels as if she is doing a lot better.  She reports that she is still noticing some redness, but it appears improved.  She reports that she still has some swelling but does not feel as if it is as much.  She reports that she has a few doses of the antibiotic left. Upon reviewing her chart, she did have labs yesterday during her oncology visit for chemotherapy.  Her CBC showed a WBC of 13.  Improved from 02/19/2021 (20.8).  The patient gave consent to have this visit done by telemedicine / virtual visit, two identifiers were used to identify patient. This is also consent for access the chart and treat the patient via this visit. The patient is located at home.  I, the provider, am at the office.  We spent 7 minutes together for the visit.  Joined by telephone.  She has an appointment scheduled for 03/19/2021 for reevaluation in the office.  I discussed with her that her lab results from the culture we sent on 03/04/2021 was still pending.  I discussed with her that I would call or send a MyChart message once we receive results and update antibiotic treatment at that time.  Patient is understanding of this and in agreement.  I recommend she call with questions or concerns or if her symptoms worsen or change.

## 2021-03-07 ENCOUNTER — Inpatient Hospital Stay: Payer: 59

## 2021-03-07 VITALS — BP 133/76 | HR 99 | Resp 20

## 2021-03-07 DIAGNOSIS — C50412 Malignant neoplasm of upper-outer quadrant of left female breast: Secondary | ICD-10-CM | POA: Diagnosis not present

## 2021-03-07 DIAGNOSIS — D6481 Anemia due to antineoplastic chemotherapy: Secondary | ICD-10-CM | POA: Diagnosis not present

## 2021-03-07 DIAGNOSIS — Z5111 Encounter for antineoplastic chemotherapy: Secondary | ICD-10-CM | POA: Diagnosis not present

## 2021-03-07 DIAGNOSIS — Z170 Estrogen receptor positive status [ER+]: Secondary | ICD-10-CM

## 2021-03-07 DIAGNOSIS — C773 Secondary and unspecified malignant neoplasm of axilla and upper limb lymph nodes: Secondary | ICD-10-CM | POA: Diagnosis not present

## 2021-03-07 MED ORDER — PEGFILGRASTIM-BMEZ 6 MG/0.6ML ~~LOC~~ SOSY
6.0000 mg | PREFILLED_SYRINGE | Freq: Once | SUBCUTANEOUS | Status: AC
  Administered 2021-03-07: 6 mg via SUBCUTANEOUS

## 2021-03-07 MED ORDER — PEGFILGRASTIM-BMEZ 6 MG/0.6ML ~~LOC~~ SOSY
PREFILLED_SYRINGE | SUBCUTANEOUS | Status: AC
  Filled 2021-03-07: qty 0.6

## 2021-03-07 NOTE — Patient Instructions (Signed)
Pegfilgrastim injection What is this medicine? PEGFILGRASTIM (PEG fil gra stim) is a long-acting granulocyte colony-stimulating factor that stimulates the growth of neutrophils, a type of white blood cell important in the body's fight against infection. It is used to reduce the incidence of fever and infection in patients with certain types of cancer who are receiving chemotherapy that affects the bone marrow, and to increase survival after being exposed to high doses of radiation. This medicine may be used for other purposes; ask your health care provider or pharmacist if you have questions. COMMON BRAND NAME(S): Fulphila, Neulasta, Nyvepria, UDENYCA, Ziextenzo What should I tell my health care provider before I take this medicine? They need to know if you have any of these conditions:  kidney disease  latex allergy  ongoing radiation therapy  sickle cell disease  skin reactions to acrylic adhesives (On-Body Injector only)  an unusual or allergic reaction to pegfilgrastim, filgrastim, other medicines, foods, dyes, or preservatives  pregnant or trying to get pregnant  breast-feeding How should I use this medicine? This medicine is for injection under the skin. If you get this medicine at home, you will be taught how to prepare and give the pre-filled syringe or how to use the On-body Injector. Refer to the patient Instructions for Use for detailed instructions. Use exactly as directed. Tell your healthcare provider immediately if you suspect that the On-body Injector may not have performed as intended or if you suspect the use of the On-body Injector resulted in a missed or partial dose. It is important that you put your used needles and syringes in a special sharps container. Do not put them in a trash can. If you do not have a sharps container, call your pharmacist or healthcare provider to get one. Talk to your pediatrician regarding the use of this medicine in children. While this drug  may be prescribed for selected conditions, precautions do apply. Overdosage: If you think you have taken too much of this medicine contact a poison control center or emergency room at once. NOTE: This medicine is only for you. Do not share this medicine with others. What if I miss a dose? It is important not to miss your dose. Call your doctor or health care professional if you miss your dose. If you miss a dose due to an On-body Injector failure or leakage, a new dose should be administered as soon as possible using a single prefilled syringe for manual use. What may interact with this medicine? Interactions have not been studied. This list may not describe all possible interactions. Give your health care provider a list of all the medicines, herbs, non-prescription drugs, or dietary supplements you use. Also tell them if you smoke, drink alcohol, or use illegal drugs. Some items may interact with your medicine. What should I watch for while using this medicine? Your condition will be monitored carefully while you are receiving this medicine. You may need blood work done while you are taking this medicine. Talk to your health care provider about your risk of cancer. You may be more at risk for certain types of cancer if you take this medicine. If you are going to need a MRI, CT scan, or other procedure, tell your doctor that you are using this medicine (On-Body Injector only). What side effects may I notice from receiving this medicine? Side effects that you should report to your doctor or health care professional as soon as possible:  allergic reactions (skin rash, itching or hives, swelling of   the face, lips, or tongue)  back pain  dizziness  fever  pain, redness, or irritation at site where injected  pinpoint red spots on the skin  red or dark-brown urine  shortness of breath or breathing problems  stomach or side pain, or pain at the shoulder  swelling  tiredness  trouble  passing urine or change in the amount of urine  unusual bruising or bleeding Side effects that usually do not require medical attention (report to your doctor or health care professional if they continue or are bothersome):  bone pain  muscle pain This list may not describe all possible side effects. Call your doctor for medical advice about side effects. You may report side effects to FDA at 1-800-FDA-1088. Where should I keep my medicine? Keep out of the reach of children. If you are using this medicine at home, you will be instructed on how to store it. Throw away any unused medicine after the expiration date on the label. NOTE: This sheet is a summary. It may not cover all possible information. If you have questions about this medicine, talk to your doctor, pharmacist, or health care provider.  2021 Elsevier/Gold Standard (2019-11-17 13:20:51)  

## 2021-03-10 ENCOUNTER — Encounter: Payer: Self-pay | Admitting: *Deleted

## 2021-03-10 LAB — AEROBIC/ANAEROBIC CULTURE W GRAM STAIN (SURGICAL/DEEP WOUND)

## 2021-03-14 MED ORDER — SULFAMETHOXAZOLE-TRIMETHOPRIM 800-160 MG PO TABS: 1.0000 | ORAL_TABLET | Freq: Two times a day (BID) | ORAL | 0 refills | Status: DC

## 2021-03-18 ENCOUNTER — Telehealth: Payer: Self-pay | Admitting: Hematology and Oncology

## 2021-03-18 ENCOUNTER — Encounter: Payer: Self-pay | Admitting: Plastic Surgery

## 2021-03-18 ENCOUNTER — Other Ambulatory Visit (HOSPITAL_COMMUNITY)
Admission: RE | Admit: 2021-03-18 | Discharge: 2021-03-18 | Disposition: A | Discharge status: Home / Self Care | Payer: 59 | Source: Ambulatory Visit | Attending: Plastic Surgery | Admitting: Plastic Surgery

## 2021-03-18 ENCOUNTER — Ambulatory Visit (INDEPENDENT_AMBULATORY_CARE_PROVIDER_SITE_OTHER): Payer: 59 | Admitting: Plastic Surgery

## 2021-03-18 ENCOUNTER — Other Ambulatory Visit: Payer: Self-pay

## 2021-03-18 ENCOUNTER — Encounter (HOSPITAL_COMMUNITY): Payer: Self-pay | Admitting: Plastic Surgery

## 2021-03-18 DIAGNOSIS — Z01812 Encounter for preprocedural laboratory examination: Secondary | ICD-10-CM | POA: Diagnosis not present

## 2021-03-18 DIAGNOSIS — Z9013 Acquired absence of bilateral breasts and nipples: Secondary | ICD-10-CM

## 2021-03-18 DIAGNOSIS — Z20822 Contact with and (suspected) exposure to covid-19: Secondary | ICD-10-CM | POA: Diagnosis not present

## 2021-03-18 LAB — SARS CORONAVIRUS 2 (TAT 6-24 HRS): SARS Coronavirus 2: NEGATIVE

## 2021-03-18 NOTE — Progress Notes (Signed)
Mrs. Jennifer Davis denies chest pain or shortness of breath. Patient was tested for Covid and has been in quarantine since that time.  In 2017, patient was experiencing palpations, patient wore a holter monitor- it showed some tachyacardia.  Mrs. Jennifer Davis decreased caffeine and no longer has palpations.

## 2021-03-18 NOTE — Telephone Encounter (Signed)
R/s appts per 5/10 sch msg. Pt aware.  

## 2021-03-18 NOTE — H&P (View-Only) (Signed)
   Patient ID: Jennifer Davis, female    DOB: 03/11/1976, 45 y.o.   MRN: [id number]   Chief Complaint  Patient presents with  . Follow-up    The patient is a 45-year-old female here for follow-up on her bilateral breast reconstruction.  She has been getting expansion and chemotherapy.  We have had some challenges with the right expander.  Today she comes in and I am concerned that her skin is cannot open up if it has not already.  I went ahead and removed all of the fluid from her expander on the right side.  Her left side seems fine.  It is red but I do not see any purulence.  Her original surgery was January 26 and then she had a takeback on February 26.   Review of Systems  Constitutional: Negative.   HENT: Negative.   Eyes: Negative.   Respiratory: Negative.   Cardiovascular: Negative.   Gastrointestinal: Negative.   Genitourinary: Negative.   Skin: Positive for color change and wound.  Hematological: Negative.   Psychiatric/Behavioral: Negative.     Past Medical History:  Diagnosis Date  . Anxiety   . Asthma   . Breast cancer (HCC)   . GERD (gastroesophageal reflux disease)     Past Surgical History:  Procedure Laterality Date  . BREAST RECONSTRUCTION WITH PLACEMENT OF TISSUE EXPANDER AND FLEX HD (ACELLULAR HYDRATED DERMIS) Bilateral 12/04/2020   Procedure: IMMEDIATE BILATERAL BREAST RECONSTRUCTION WITH PLACEMENT OF TISSUE EXPANDER AND FLEX HD (ACELLULAR HYDRATED DERMIS);  Surgeon: Shephanie Romas S, DO;  Location: [facility];  Service: Plastics;  Laterality: Bilateral;  . CESAREAN SECTION     x3  . MASTECTOMY WITH AXILLARY LYMPH NODE DISSECTION Left 12/04/2020   Procedure: BILATERAL MASTECTOMIES, RADIOACTIVE SEED GUIDED TARGETED LEFT AXILLARY NODE DISSECTION;  Surgeon: Blackman, Douglas, MD;  Location: [facility];  Service: General;  Laterality: Left;  . PORTACATH PLACEMENT Left 12/04/2020   Procedure: INSERTION PORT-A-CATH LEFT SUBCLAVIAN;   Surgeon: Blackman, Douglas, MD;  Location: [facility];  Service: General;  Laterality: Left;  . REMOVAL OF TISSUE EXPANDER AND PLACEMENT OF IMPLANT Right 01/04/2021   Procedure: REMOVAL OF TISSUE EXPANDER AND PLACEMENT OF NEW EXPANDER;  Surgeon: Yeimy Brabant S, DO;  Location: [facility];  Service: Plastics;  Laterality: Right;      Current Outpatient Medications:  .  acetaminophen (TYLENOL) 500 MG tablet, TAKE 1 TABLET (500 MG TOTAL) BY MOUTH EVERY 6 (SIX) HOURS AS NEEDED. FOR USE AFTER SURGERY (Patient taking differently: Take 500 mg by mouth every 6 (six) hours as needed for mild pain, fever or headache.), Disp: 30 tablet, Rfl: 0 .  budesonide-formoterol (SYMBICORT) 160-4.5 MCG/ACT inhaler, INHALE 2 PUFFS BY MOUTH 2 TIMES A DAY, Disp: 10.2 g, Rfl: 5 .  busPIRone (BUSPAR) 30 MG tablet, TAKE 1 TABLET (30 MG TOTAL) BY MOUTH 2 (TWO) TIMES DAILY., Disp: 60 tablet, Rfl: 5 .  cetirizine (ZYRTEC) 10 MG tablet, Take 10 mg by mouth daily., Disp: , Rfl:  .  dexamethasone (DECADRON) 4 MG tablet, TAKE 1 TABLET (4 MG TOTAL) BY MOUTH DAILY. TAKE 1 TABLET THE DAY AFTER CHEMO AND 1 TABLET 2 DAYS AFTER CHEMO. TAKE WITH FOOD., Disp: 8 tablet, Rfl: 0 .  diazepam (VALIUM) 2 MG tablet, TAKE 1 TABLET (2 MG TOTAL) BY MOUTH EVERY 12 (TWELVE) HOURS AS NEEDED FOR UP TO 15 DAYS FOR MUSCLE SPASMS., Disp: 30 tablet, Rfl: 0 .  DULoxetine (CYMBALTA) 60 MG capsule, TAKE   2 CAPSULES (120 MG TOTAL) BY MOUTH DAILY., Disp: 60 capsule, Rfl: 5 .  EPINEPHrine (EPIPEN 2-PAK) 0.3 mg/0.3 mL IJ SOAJ injection, Inject 0.3 mLs (0.3 mg total) into the muscle once as needed for up to 1 dose (for severe allergic reaction). CAll 911 immediately if you have to use this medicine, Disp: 1 each, Rfl: 1 .  gabapentin (NEURONTIN) 100 MG capsule, Take 1 capsule (100 mg total) by mouth 3 (three) times daily as needed. (Patient taking differently: Take 100 mg by mouth 3 (three) times daily as needed (neuropathy).), Disp: 90 capsule, Rfl: 1 .   HYDROcodone-acetaminophen (NORCO/VICODIN) 5-325 MG tablet, TAKE 1 TABLET BY MOUTH EVERY 8 (EIGHT) HOURS AS NEEDED FOR UP TO 5 DAYS FOR SEVERE PAIN., Disp: 15 tablet, Rfl: 0 .  ibuprofen (ADVIL) 600 MG tablet, TAKE 1 TABLET (600 MG TOTAL) BY MOUTH EVERY 6 (SIX) HOURS AS NEEDED FOR MILD PAIN OR MODERATE PAIN. FOR USE AFTER SURGERY, Disp: 30 tablet, Rfl: 0 .  lidocaine-prilocaine (EMLA) cream, APPLY TO AFFECTED AREA ONCE (Patient taking differently: Apply 1 application topically as needed (port access).), Disp: 30 g, Rfl: 3 .  LORazepam (ATIVAN) 0.5 MG tablet, Take 0.5 mg by mouth at bedtime as needed (nausea)., Disp: , Rfl:  .  melatonin 5 MG TABS, Take 5 mg by mouth at bedtime as needed (sleep)., Disp: , Rfl:  .  montelukast (SINGULAIR) 10 MG tablet, Take 10 mg by mouth daily., Disp: , Rfl: 5 .  Multiple Vitamin (MULTIVITAMIN WITH MINERALS) TABS tablet, Take 1 tablet by mouth daily., Disp: , Rfl:  .  ondansetron (ZOFRAN-ODT) 4 MG disintegrating tablet, TAKE 1 TABLET (4 MG TOTAL) BY MOUTH EVERY 8 (EIGHT) HOURS AS NEEDED FOR NAUSEA OR VOMITING., Disp: 20 tablet, Rfl: 0 .  PROAIR HFA 108 (90 Base) MCG/ACT inhaler, Inhale 2 puffs into the lungs as needed for wheezing or shortness of breath., Disp: , Rfl: 0 .  prochlorperazine (COMPAZINE) 10 MG tablet, TAKE 1 TABLET (10 MG TOTAL) BY MOUTH EVERY 6 (SIX) HOURS AS NEEDED (NAUSEA OR VOMITING)., Disp: 30 tablet, Rfl: 1 .  sulfamethoxazole-trimethoprim (BACTRIM DS) 800-160 MG tablet, Take 1 tablet by mouth 2 (two) times daily., Disp: 28 tablet, Rfl: 0 .  tamoxifen (NOLVADEX) 10 MG tablet, TAKE 1 TABLET (10 MG TOTAL) BY MOUTH DAILY., Disp: 30 tablet, Rfl: 0 .  Multiple Vitamins-Minerals (MULTIVITAMIN PO), Take 2 tablets by mouth daily. Skin/hair/nail supplement., Disp: , Rfl:    Objective:   There were no vitals filed for this visit.  Physical Exam Vitals and nursing note reviewed.  Constitutional:      Appearance: Normal appearance.  HENT:     Head:  Normocephalic and atraumatic.  Cardiovascular:     Rate and Rhythm: Normal rate.     Pulses: Normal pulses.  Pulmonary:     Effort: Pulmonary effort is normal.  Abdominal:     General: Abdomen is flat.  Neurological:     General: No focal deficit present.     Mental Status: She is alert and oriented to person, place, and time.  Psychiatric:        Mood and Affect: Mood normal.        Behavior: Behavior normal.     Assessment & Plan:  Acquired absence of both breasts  I removed all the fluid from the right breast expander.  Nothing was done to the left side.  We will plan on return to the OR in the next 1 to 2 days   for removal of the expander.  We will have to wait until chemotherapy is finished to do any reconstruction.  [place], DO

## 2021-03-18 NOTE — Progress Notes (Signed)
Patient ID: Jennifer Davis, female    DOB: Mar 22, 1976, 45 y.o.   MRN: [id number]   Chief Complaint  Patient presents with  . Follow-up    The patient is a 45 year old female here for follow-up on her bilateral breast reconstruction.  She has been getting expansion and chemotherapy.  We have had some challenges with the right expander.  Today she comes in and I am concerned that her skin is cannot open up if it has not already.  I went ahead and removed all of the fluid from her expander on the right side.  Her left side seems fine.  It is red but I do not see any purulence.  Her original surgery was January 26 and then she had a takeback on February 26.   Review of Systems  Constitutional: Negative.   HENT: Negative.   Eyes: Negative.   Respiratory: Negative.   Cardiovascular: Negative.   Gastrointestinal: Negative.   Genitourinary: Negative.   Skin: Positive for color change and wound.  Hematological: Negative.   Psychiatric/Behavioral: Negative.     Past Medical History:  Diagnosis Date  . Anxiety   . Asthma   . Breast cancer ([place])   . GERD (gastroesophageal reflux disease)     Past Surgical History:  Procedure Laterality Date  . BREAST RECONSTRUCTION WITH PLACEMENT OF TISSUE EXPANDER AND FLEX HD (ACELLULAR HYDRATED DERMIS) Bilateral 12/04/2020   Procedure: IMMEDIATE BILATERAL BREAST RECONSTRUCTION WITH PLACEMENT OF TISSUE EXPANDER AND FLEX HD (ACELLULAR HYDRATED DERMIS);  Surgeon: Wallace Going, DO;  Location: [facility];  Service: Plastics;  Laterality: Bilateral;  . CESAREAN SECTION     x3  . MASTECTOMY WITH AXILLARY LYMPH NODE DISSECTION Left 12/04/2020   Procedure: BILATERAL MASTECTOMIES, RADIOACTIVE SEED GUIDED TARGETED LEFT AXILLARY NODE DISSECTION;  Surgeon: Coralie Keens, MD;  Location: [facility];  Service: General;  Laterality: Left;  . PORTACATH PLACEMENT Left 12/04/2020   Procedure: INSERTION PORT-A-CATH LEFT SUBCLAVIAN;   Surgeon: Coralie Keens, MD;  Location: [facility];  Service: General;  Laterality: Left;  . REMOVAL OF TISSUE EXPANDER AND PLACEMENT OF IMPLANT Right 01/04/2021   Procedure: REMOVAL OF TISSUE EXPANDER AND PLACEMENT OF NEW EXPANDER;  Surgeon: Wallace Going, DO;  Location: [facility];  Service: Plastics;  Laterality: Right;      Current Outpatient Medications:  .  acetaminophen (TYLENOL) 500 MG tablet, TAKE 1 TABLET (500 MG TOTAL) BY MOUTH EVERY 6 (SIX) HOURS AS NEEDED. FOR USE AFTER SURGERY (Patient taking differently: Take 500 mg by mouth every 6 (six) hours as needed for mild pain, fever or headache.), Disp: 30 tablet, Rfl: 0 .  budesonide-formoterol (SYMBICORT) 160-4.5 MCG/ACT inhaler, INHALE 2 PUFFS BY MOUTH 2 TIMES A DAY, Disp: 10.2 g, Rfl: 5 .  busPIRone (BUSPAR) 30 MG tablet, TAKE 1 TABLET (30 MG TOTAL) BY MOUTH 2 (TWO) TIMES DAILY., Disp: 60 tablet, Rfl: 5 .  cetirizine (ZYRTEC) 10 MG tablet, Take 10 mg by mouth daily., Disp: , Rfl:  .  dexamethasone (DECADRON) 4 MG tablet, TAKE 1 TABLET (4 MG TOTAL) BY MOUTH DAILY. TAKE 1 TABLET THE DAY AFTER CHEMO AND 1 TABLET 2 DAYS AFTER CHEMO. TAKE WITH FOOD., Disp: 8 tablet, Rfl: 0 .  diazepam (VALIUM) 2 MG tablet, TAKE 1 TABLET (2 MG TOTAL) BY MOUTH EVERY 12 (TWELVE) HOURS AS NEEDED FOR UP TO 15 DAYS FOR MUSCLE SPASMS., Disp: 30 tablet, Rfl: 0 .  DULoxetine (CYMBALTA) 60 MG capsule, TAKE  2 CAPSULES (120 MG TOTAL) BY MOUTH DAILY., Disp: 60 capsule, Rfl: 5 .  EPINEPHrine (EPIPEN 2-PAK) 0.3 mg/0.3 mL IJ SOAJ injection, Inject 0.3 mLs (0.3 mg total) into the muscle once as needed for up to 1 dose (for severe allergic reaction). CAll 911 immediately if you have to use this medicine, Disp: 1 each, Rfl: 1 .  gabapentin (NEURONTIN) 100 MG capsule, Take 1 capsule (100 mg total) by mouth 3 (three) times daily as needed. (Patient taking differently: Take 100 mg by mouth 3 (three) times daily as needed (neuropathy).), Disp: 90 capsule, Rfl: 1 .   HYDROcodone-acetaminophen (NORCO/VICODIN) 5-325 MG tablet, TAKE 1 TABLET BY MOUTH EVERY 8 (EIGHT) HOURS AS NEEDED FOR UP TO 5 DAYS FOR SEVERE PAIN., Disp: 15 tablet, Rfl: 0 .  ibuprofen (ADVIL) 600 MG tablet, TAKE 1 TABLET (600 MG TOTAL) BY MOUTH EVERY 6 (SIX) HOURS AS NEEDED FOR MILD PAIN OR MODERATE PAIN. FOR USE AFTER SURGERY, Disp: 30 tablet, Rfl: 0 .  lidocaine-prilocaine (EMLA) cream, APPLY TO AFFECTED AREA ONCE (Patient taking differently: Apply 1 application topically as needed (port access).), Disp: 30 g, Rfl: 3 .  LORazepam (ATIVAN) 0.5 MG tablet, Take 0.5 mg by mouth at bedtime as needed (nausea)., Disp: , Rfl:  .  melatonin 5 MG TABS, Take 5 mg by mouth at bedtime as needed (sleep)., Disp: , Rfl:  .  montelukast (SINGULAIR) 10 MG tablet, Take 10 mg by mouth daily., Disp: , Rfl: 5 .  Multiple Vitamin (MULTIVITAMIN WITH MINERALS) TABS tablet, Take 1 tablet by mouth daily., Disp: , Rfl:  .  ondansetron (ZOFRAN-ODT) 4 MG disintegrating tablet, TAKE 1 TABLET (4 MG TOTAL) BY MOUTH EVERY 8 (EIGHT) HOURS AS NEEDED FOR NAUSEA OR VOMITING., Disp: 20 tablet, Rfl: 0 .  PROAIR HFA 108 (90 Base) MCG/ACT inhaler, Inhale 2 puffs into the lungs as needed for wheezing or shortness of breath., Disp: , Rfl: 0 .  prochlorperazine (COMPAZINE) 10 MG tablet, TAKE 1 TABLET (10 MG TOTAL) BY MOUTH EVERY 6 (SIX) HOURS AS NEEDED (NAUSEA OR VOMITING)., Disp: 30 tablet, Rfl: 1 .  sulfamethoxazole-trimethoprim (BACTRIM DS) 800-160 MG tablet, Take 1 tablet by mouth 2 (two) times daily., Disp: 28 tablet, Rfl: 0 .  tamoxifen (NOLVADEX) 10 MG tablet, TAKE 1 TABLET (10 MG TOTAL) BY MOUTH DAILY., Disp: 30 tablet, Rfl: 0 .  Multiple Vitamins-Minerals (MULTIVITAMIN PO), Take 2 tablets by mouth daily. Skin/hair/nail supplement., Disp: , Rfl:    Objective:   There were no vitals filed for this visit.  Physical Exam Vitals and nursing note reviewed.  Constitutional:      Appearance: Normal appearance.  HENT:     Head:  Normocephalic and atraumatic.  Cardiovascular:     Rate and Rhythm: Normal rate.     Pulses: Normal pulses.  Pulmonary:     Effort: Pulmonary effort is normal.  Abdominal:     General: Abdomen is flat.  Neurological:     General: No focal deficit present.     Mental Status: She is alert and oriented to person, place, and time.  Psychiatric:        Mood and Affect: Mood normal.        Behavior: Behavior normal.     Assessment & Plan:  Acquired absence of both breasts  I removed all the fluid from the right breast expander.  Nothing was done to the left side.  We will plan on return to the OR in the next 1 to 2 days  for removal of the expander.  We will have to wait until chemotherapy is finished to do any reconstruction.  [place], DO

## 2021-03-19 ENCOUNTER — Other Ambulatory Visit: Payer: Self-pay

## 2021-03-19 ENCOUNTER — Ambulatory Visit: Payer: 59 | Admitting: Surgical

## 2021-03-19 ENCOUNTER — Ambulatory Visit (HOSPITAL_COMMUNITY): Payer: 59 | Admitting: Certified Registered Nurse Anesthetist

## 2021-03-19 ENCOUNTER — Ambulatory Visit (HOSPITAL_COMMUNITY)
Admission: RE | Admit: 2021-03-19 | Discharge: 2021-03-19 | Disposition: A | Discharge status: Home / Self Care | Payer: 59 | Attending: Plastic Surgery | Admitting: Plastic Surgery

## 2021-03-19 ENCOUNTER — Encounter (HOSPITAL_COMMUNITY): Payer: Self-pay | Admitting: Plastic Surgery

## 2021-03-19 ENCOUNTER — Encounter (HOSPITAL_COMMUNITY)
Admission: RE | Disposition: A | Discharge status: Home / Self Care | Payer: Self-pay | Source: Home / Self Care | Attending: Plastic Surgery

## 2021-03-19 DIAGNOSIS — Z7951 Long term (current) use of inhaled steroids: Secondary | ICD-10-CM | POA: Diagnosis not present

## 2021-03-19 DIAGNOSIS — Z170 Estrogen receptor positive status [ER+]: Secondary | ICD-10-CM | POA: Diagnosis not present

## 2021-03-19 DIAGNOSIS — L7634 Postprocedural seroma of skin and subcutaneous tissue following other procedure: Secondary | ICD-10-CM | POA: Insufficient documentation

## 2021-03-19 DIAGNOSIS — Z6841 Body Mass Index (BMI) 40.0 and over, adult: Secondary | ICD-10-CM | POA: Diagnosis not present

## 2021-03-19 DIAGNOSIS — C50919 Malignant neoplasm of unspecified site of unspecified female breast: Secondary | ICD-10-CM | POA: Diagnosis not present

## 2021-03-19 DIAGNOSIS — Z79899 Other long term (current) drug therapy: Secondary | ICD-10-CM | POA: Diagnosis not present

## 2021-03-19 DIAGNOSIS — Z87891 Personal history of nicotine dependence: Secondary | ICD-10-CM | POA: Insufficient documentation

## 2021-03-19 DIAGNOSIS — C50412 Malignant neoplasm of upper-outer quadrant of left female breast: Secondary | ICD-10-CM | POA: Diagnosis not present

## 2021-03-19 DIAGNOSIS — Z9013 Acquired absence of bilateral breasts and nipples: Secondary | ICD-10-CM | POA: Insufficient documentation

## 2021-03-19 DIAGNOSIS — Z9011 Acquired absence of right breast and nipple: Secondary | ICD-10-CM | POA: Diagnosis not present

## 2021-03-19 DIAGNOSIS — T8549XA Other mechanical complication of breast prosthesis and implant, initial encounter: Secondary | ICD-10-CM | POA: Diagnosis not present

## 2021-03-19 HISTORY — PX: TISSUE EXPANDER PLACEMENT: SHX2530

## 2021-03-19 HISTORY — DX: Dyspnea, unspecified: R06.00

## 2021-03-19 HISTORY — DX: Unspecified asthma, uncomplicated: J45.909

## 2021-03-19 HISTORY — DX: Post-traumatic stress disorder, unspecified: F43.10

## 2021-03-19 LAB — POCT PREGNANCY, URINE: Preg Test, Ur: NEGATIVE

## 2021-03-19 SURGERY — INSERTION, TISSUE EXPANDER
Anesthesia: General | Site: Breast | Laterality: Right

## 2021-03-19 MED ORDER — OXYCODONE HCL 5 MG PO TABS: 5.0000 mg | ORAL_TABLET | Freq: Once | ORAL | Status: DC | PRN

## 2021-03-19 MED ORDER — LIDOCAINE 2% (20 MG/ML) 5 ML SYRINGE
INTRAMUSCULAR | Status: DC | PRN
  Administered 2021-03-19: 60 mg via INTRAVENOUS

## 2021-03-19 MED ORDER — DEXAMETHASONE SODIUM PHOSPHATE 10 MG/ML IJ SOLN
INTRAMUSCULAR | Status: DC | PRN
  Administered 2021-03-19: 5 mg via INTRAVENOUS

## 2021-03-19 MED ORDER — MIDAZOLAM HCL 2 MG/2ML IJ SOLN
INTRAMUSCULAR | Status: DC | PRN
  Administered 2021-03-19: 2 mg via INTRAVENOUS

## 2021-03-19 MED ORDER — CHLORHEXIDINE GLUCONATE CLOTH 2 % EX PADS: 6.0000 | MEDICATED_PAD | Freq: Once | CUTANEOUS | Status: DC

## 2021-03-19 MED ORDER — MUPIROCIN CALCIUM 2 % EX CREA
TOPICAL_CREAM | CUTANEOUS | Status: DC | PRN
  Administered 2021-03-19: 1 via TOPICAL

## 2021-03-19 MED ORDER — SODIUM CHLORIDE 0.9% FLUSH: 3.0000 mL | Freq: Two times a day (BID) | INTRAVENOUS | Status: DC

## 2021-03-19 MED ORDER — ACETAMINOPHEN 10 MG/ML IV SOLN
INTRAVENOUS | Status: AC
  Filled 2021-03-19: qty 100

## 2021-03-19 MED ORDER — FENTANYL CITRATE (PF) 250 MCG/5ML IJ SOLN
INTRAMUSCULAR | Status: AC
  Filled 2021-03-19: qty 5

## 2021-03-19 MED ORDER — CEFAZOLIN SODIUM-DEXTROSE 2-4 GM/100ML-% IV SOLN
2.0000 g | INTRAVENOUS | Status: AC
  Administered 2021-03-19: 2 g via INTRAVENOUS
  Filled 2021-03-19: qty 100

## 2021-03-19 MED ORDER — DEXMEDETOMIDINE (PRECEDEX) IN NS 20 MCG/5ML (4 MCG/ML) IV SYRINGE
PREFILLED_SYRINGE | INTRAVENOUS | Status: DC | PRN
  Administered 2021-03-19: 20 ug via INTRAVENOUS

## 2021-03-19 MED ORDER — MUPIROCIN 2 % EX OINT
TOPICAL_OINTMENT | CUTANEOUS | Status: AC
  Filled 2021-03-19: qty 22

## 2021-03-19 MED ORDER — ACETAMINOPHEN 10 MG/ML IV SOLN: 1000.0000 mg | Freq: Once | INTRAVENOUS | Status: DC | PRN

## 2021-03-19 MED ORDER — OXYCODONE HCL 5 MG PO TABS: 5.0000 mg | ORAL_TABLET | ORAL | Status: DC | PRN

## 2021-03-19 MED ORDER — KETOROLAC TROMETHAMINE 30 MG/ML IJ SOLN: 30.0000 mg | Freq: Once | INTRAMUSCULAR | Status: DC

## 2021-03-19 MED ORDER — PROMETHAZINE HCL 25 MG/ML IJ SOLN: 6.2500 mg | INTRAMUSCULAR | Status: DC | PRN

## 2021-03-19 MED ORDER — FENTANYL CITRATE (PF) 100 MCG/2ML IJ SOLN: 25.0000 ug | INTRAMUSCULAR | Status: DC | PRN

## 2021-03-19 MED ORDER — ONDANSETRON HCL 4 MG/2ML IJ SOLN
INTRAMUSCULAR | Status: DC | PRN
  Administered 2021-03-19: 4 mg via INTRAVENOUS

## 2021-03-19 MED ORDER — FENTANYL CITRATE (PF) 250 MCG/5ML IJ SOLN
INTRAMUSCULAR | Status: DC | PRN
  Administered 2021-03-19: 25 ug via INTRAVENOUS
  Administered 2021-03-19 (×2): 50 ug via INTRAVENOUS
  Administered 2021-03-19: 75 ug via INTRAVENOUS
  Administered 2021-03-19: 50 ug via INTRAVENOUS
  Administered 2021-03-19: 100 ug via INTRAVENOUS

## 2021-03-19 MED ORDER — PROPOFOL 10 MG/ML IV BOLUS
INTRAVENOUS | Status: AC
  Filled 2021-03-19: qty 20

## 2021-03-19 MED ORDER — PROPOFOL 10 MG/ML IV BOLUS
INTRAVENOUS | Status: DC | PRN
  Administered 2021-03-19: 200 mg via INTRAVENOUS

## 2021-03-19 MED ORDER — OXYCODONE HCL 5 MG/5ML PO SOLN: 5.0000 mg | Freq: Once | ORAL | Status: DC | PRN

## 2021-03-19 MED ORDER — SODIUM CHLORIDE 0.9 % IV SOLN
250.0000 mL | INTRAVENOUS | Status: DC | PRN
Start: 2021-03-19 — End: 2021-03-19

## 2021-03-19 MED ORDER — ACETAMINOPHEN 10 MG/ML IV SOLN
INTRAVENOUS | Status: DC | PRN
  Administered 2021-03-19: 1000 mg via INTRAVENOUS

## 2021-03-19 MED ORDER — ACETAMINOPHEN 650 MG RE SUPP: 650.0000 mg | RECTAL | Status: DC | PRN

## 2021-03-19 MED ORDER — AMISULPRIDE (ANTIEMETIC) 5 MG/2ML IV SOLN: 10.0000 mg | Freq: Once | INTRAVENOUS | Status: DC | PRN

## 2021-03-19 MED ORDER — 0.9 % SODIUM CHLORIDE (POUR BTL) OPTIME
TOPICAL | Status: DC | PRN
  Administered 2021-03-19: 1000 mL

## 2021-03-19 MED ORDER — ACETAMINOPHEN 325 MG PO TABS: 650.0000 mg | ORAL_TABLET | ORAL | Status: DC | PRN

## 2021-03-19 MED ORDER — SODIUM CHLORIDE 0.9% FLUSH: 3.0000 mL | INTRAVENOUS | Status: DC | PRN

## 2021-03-19 MED ORDER — HEMOSTATIC AGENTS (NO CHARGE) OPTIME
TOPICAL | Status: DC | PRN
  Administered 2021-03-19: 2 via TOPICAL

## 2021-03-19 MED ORDER — CHLORHEXIDINE GLUCONATE 0.12 % MT SOLN
15.0000 mL | Freq: Once | OROMUCOSAL | Status: AC
  Administered 2021-03-19: 15 mL via OROMUCOSAL
  Filled 2021-03-19: qty 15

## 2021-03-19 MED ORDER — LIDOCAINE-EPINEPHRINE 1 %-1:100000 IJ SOLN
INTRAMUSCULAR | Status: DC | PRN
  Administered 2021-03-19: 15 mL

## 2021-03-19 MED ORDER — LIDOCAINE-EPINEPHRINE 1 %-1:100000 IJ SOLN
INTRAMUSCULAR | Status: AC
  Filled 2021-03-19: qty 1

## 2021-03-19 MED ORDER — ORAL CARE MOUTH RINSE: 15.0000 mL | Freq: Once | OROMUCOSAL | Status: AC

## 2021-03-19 MED ORDER — MIDAZOLAM HCL 2 MG/2ML IJ SOLN
INTRAMUSCULAR | Status: AC
  Filled 2021-03-19: qty 2

## 2021-03-19 MED ORDER — LACTATED RINGERS IV SOLN: INTRAVENOUS | Status: DC

## 2021-03-19 SURGICAL SUPPLY — 52 items
BAG DECANTER FOR FLEXI CONT (MISCELLANEOUS) ×2 IMPLANT
BINDER ABDOMINAL 12 XL 75-84 (SOFTGOODS) ×2 IMPLANT
BINDER BREAST LRG (GAUZE/BANDAGES/DRESSINGS) IMPLANT
BINDER BREAST XLRG (GAUZE/BANDAGES/DRESSINGS) IMPLANT
BIOPATCH RED 1 DISK 7.0 (GAUZE/BANDAGES/DRESSINGS) ×2 IMPLANT
CANISTER SUCT 3000ML PPV (MISCELLANEOUS) ×2 IMPLANT
CHLORAPREP W/TINT 26 (MISCELLANEOUS) ×2 IMPLANT
COVER SURGICAL LIGHT HANDLE (MISCELLANEOUS) ×2 IMPLANT
COVER WAND RF STERILE (DRAPES) ×2 IMPLANT
DERMABOND ADVANCED (GAUZE/BANDAGES/DRESSINGS) ×1
DERMABOND ADVANCED .7 DNX12 (GAUZE/BANDAGES/DRESSINGS) ×1 IMPLANT
DRAIN CHANNEL 19F RND (DRAIN) ×2 IMPLANT
DRAPE HALF SHEET 40X57 (DRAPES) ×4 IMPLANT
DRAPE ORTHO SPLIT 77X108 STRL (DRAPES) ×2
DRAPE SURG 17X23 STRL (DRAPES) ×8 IMPLANT
DRAPE SURG ORHT 6 SPLT 77X108 (DRAPES) ×2 IMPLANT
DRAPE WARM FLUID 44X44 (DRAPES) ×2 IMPLANT
DRSG PAD ABDOMINAL 8X10 ST (GAUZE/BANDAGES/DRESSINGS) ×4 IMPLANT
DRSG TELFA 3X8 NADH (GAUZE/BANDAGES/DRESSINGS) ×2 IMPLANT
ELECT BLADE 4.0 EZ CLEAN MEGAD (MISCELLANEOUS) ×2
ELECT REM PT RETURN 9FT ADLT (ELECTROSURGICAL) ×2
ELECTRODE BLDE 4.0 EZ CLN MEGD (MISCELLANEOUS) ×1 IMPLANT
ELECTRODE REM PT RTRN 9FT ADLT (ELECTROSURGICAL) ×1 IMPLANT
EVACUATOR SILICONE 100CC (DRAIN) ×2 IMPLANT
GAUZE SPONGE 4X4 12PLY STRL (GAUZE/BANDAGES/DRESSINGS) ×2 IMPLANT
GLOVE BIO SURGEON STRL SZ 6.5 (GLOVE) ×2 IMPLANT
GOWN STRL REUS W/ TWL LRG LVL3 (GOWN DISPOSABLE) ×2 IMPLANT
GOWN STRL REUS W/TWL LRG LVL3 (GOWN DISPOSABLE) ×2
HEMOSTAT HEMOBLAST BELLOWS (HEMOSTASIS) ×4 IMPLANT
KIT BASIN OR (CUSTOM PROCEDURE TRAY) ×2 IMPLANT
KIT TURNOVER KIT B (KITS) ×2 IMPLANT
NS IRRIG 1000ML POUR BTL (IV SOLUTION) ×4 IMPLANT
PACK GENERAL/GYN (CUSTOM PROCEDURE TRAY) ×2 IMPLANT
PAD ABD 7.5X8 STRL (GAUZE/BANDAGES/DRESSINGS) ×2 IMPLANT
PAD ARMBOARD 7.5X6 YLW CONV (MISCELLANEOUS) ×2 IMPLANT
PIN SAFETY STERILE (MISCELLANEOUS) ×2 IMPLANT
SET ASEPTIC TRANSFER (MISCELLANEOUS) IMPLANT
SPONGE LAP 18X18 RF (DISPOSABLE) ×2 IMPLANT
STAPLER VISISTAT 35W (STAPLE) IMPLANT
SUT MNCRL AB 3-0 PS2 18 (SUTURE) ×2 IMPLANT
SUT MNCRL AB 4-0 PS2 18 (SUTURE) ×2 IMPLANT
SUT MON AB 5-0 PS2 18 (SUTURE) ×4 IMPLANT
SUT PDS AB 2-0 CT1 27 (SUTURE) ×8 IMPLANT
SUT PDS AB 3-0 SH 27 (SUTURE) IMPLANT
SUT SILK 3 0 SH 30 (SUTURE) ×2 IMPLANT
SUT VIC AB 3-0 SH 27 (SUTURE) ×3
SUT VIC AB 3-0 SH 27X BRD (SUTURE) ×3 IMPLANT
SUT VICRYL 4-0 PS2 18IN ABS (SUTURE) ×6 IMPLANT
SYR 20ML LL LF (SYRINGE) ×2 IMPLANT
TOWEL GREEN STERILE (TOWEL DISPOSABLE) ×2 IMPLANT
TOWEL GREEN STERILE FF (TOWEL DISPOSABLE) ×2 IMPLANT
TRAY FOLEY MTR SLVR 14FR STAT (SET/KITS/TRAYS/PACK) IMPLANT

## 2021-03-19 NOTE — Transfer of Care (Signed)
Immediate Anesthesia Transfer of Care Note  Patient: Jennifer Davis  Procedure(s) Performed: Removal of right tissue expander (Right Breast)  Patient Location: PACU  Anesthesia Type:General  Level of Consciousness: awake, alert  and patient cooperative  Airway & Oxygen Therapy: Patient Spontanous Breathing  Post-op Assessment: Report given to RN and Post -op Vital signs reviewed and stable  Post vital signs: Reviewed and stable  Last Vitals:  Vitals Value Taken Time  BP 128/66 03/19/21 0946  Temp    Pulse 113 03/19/21 0947  Resp 16 03/19/21 0947  SpO2 99 % 03/19/21 0947  Vitals shown include unvalidated device data.  Last Pain:  Vitals:   03/19/21 0730  TempSrc:   PainSc: 0-No pain         Complications: No complications documented.

## 2021-03-19 NOTE — Anesthesia Preprocedure Evaluation (Addendum)
Anesthesia Evaluation  Patient identified by MRN, date of birth, ID band Patient awake    Reviewed: Allergy & Precautions, NPO status , Patient's Chart, lab work & pertinent test results  Airway Mallampati: I  TM Distance: >3 FB Neck ROM: Full    Dental no notable dental hx.    Pulmonary asthma , former smoker,    Pulmonary exam normal breath sounds clear to auscultation       Cardiovascular negative cardio ROS Normal cardiovascular exam Rhythm:Regular Rate:Normal  ECG: NSR, rate 93  ECHO: 1. Left ventricular ejection fraction, by estimation, is 65 to 70%. The left ventricle has normal function. The left ventricle has no regional wall motion abnormalities. There is mild concentric left ventricular hypertrophy. Left ventricular diastolic parameters were normal. The average left ventricular global longitudinal strain is -23.0 %. The global longitudinal strain is normal. 2. Right ventricular systolic function is normal. The right ventricular size is normal. 3. The mitral valve is grossly normal. No evidence of mitral valve regurgitation. No evidence of mitral stenosis. 4. The aortic valve was not well visualized. Aortic valve regurgitation is not visualized. No aortic stenosis is present. Comparison(s): A prior study was performed on 01/27/2016. Prior images reviewed side by side. Slight improvement in ventricular function.   Neuro/Psych PSYCHIATRIC DISORDERS Anxiety Depression PTSD (post-traumatic stress disorder)negative neurological ROS     GI/Hepatic Neg liver ROS, GERD  Controlled,  Endo/Other  Morbid obesity  Renal/GU negative Renal ROS     Musculoskeletal negative musculoskeletal ROS (+)   Abdominal (+) + obese,   Peds  Hematology  (+) anemia ,   Anesthesia Other Findings Acquired absence of breast  Reproductive/Obstetrics hcg negative                            Anesthesia  Physical Anesthesia Plan  ASA: III  Anesthesia Plan: General   Post-op Pain Management:    Induction: Intravenous  PONV Risk Score and Plan: 3 and Ondansetron, Dexamethasone, Midazolam and Treatment may vary due to age or medical condition  Airway Management Planned: LMA  Additional Equipment:   Intra-op Plan:   Post-operative Plan: Extubation in OR  Informed Consent: I have reviewed the patients History and Physical, chart, labs and discussed the procedure including the risks, benefits and alternatives for the proposed anesthesia with the patient or authorized representative who has indicated his/her understanding and acceptance.     Dental advisory given  Plan Discussed with: CRNA  Anesthesia Plan Comments:        Anesthesia Quick Evaluation

## 2021-03-19 NOTE — Anesthesia Postprocedure Evaluation (Signed)
Anesthesia Post Note  Patient: Jennifer Davis  Procedure(s) Performed: Removal of right tissue expander (Right Breast)     Patient location during evaluation: PACU Anesthesia Type: General Level of consciousness: awake Pain management: pain level controlled Vital Signs Assessment: post-procedure vital signs reviewed and stable Respiratory status: spontaneous breathing, nonlabored ventilation, respiratory function stable and patient connected to nasal cannula oxygen Cardiovascular status: blood pressure returned to baseline and stable Postop Assessment: no apparent nausea or vomiting Anesthetic complications: no   No complications documented.  Last Vitals:  Vitals:   03/19/21 1001 03/19/21 1016  BP: 111/65 123/72  Pulse: (!) 109 (!) 104  Resp: 16 20  Temp:    SpO2: 98% 100%    Last Pain:  Vitals:   03/19/21 0946  TempSrc:   PainSc: 0-No pain                 Jaidah Lomax P Dontay Harm

## 2021-03-19 NOTE — Anesthesia Procedure Notes (Signed)
Procedure Name: LMA Insertion Date/Time: 03/19/2021 8:37 AM Performed by: Janace Litten, CRNA Pre-anesthesia Checklist: Patient identified, Emergency Drugs available, Suction available and Patient being monitored Patient Re-evaluated:Patient Re-evaluated prior to induction Oxygen Delivery Method: Circle System Utilized Preoxygenation: Pre-oxygenation with 100% oxygen Induction Type: IV induction Ventilation: Mask ventilation without difficulty LMA: LMA inserted LMA Size: 3.0 Number of attempts: 1 Placement Confirmation: positive ETCO2 Tube secured with: Tape Dental Injury: Teeth and Oropharynx as per pre-operative assessment

## 2021-03-19 NOTE — Op Note (Signed)
DATE OF OPERATION: 03/19/2021  LOCATION: [facility]  PREOPERATIVE DIAGNOSIS: right breast seroma  POSTOPERATIVE DIAGNOSIS: Same  PROCEDURE: Removal of right breast seroma and Flex HD  SURGEON: Victoriano Campion [place] Ritu Gagliardo, DO  EBL: 20 cc  CONDITION: Stable  COMPLICATIONS: None  INDICATION: The patient, Jennifer Davis, is a 45 y.o. female born on [birth year], is here for treatment of a right breast seroma after a repeat expander placement.  She is on chemotherapy.   PROCEDURE DETAILS:  The patient was seen prior to surgery and marked.  The IV antibiotics were given. The patient was taken to the operating room and given a general anesthetic. A standard time out was performed and all information was confirmed by those in the room. SCDs were placed.   The breast was prepped and draped with Betadine.  The thinned skin area was marked.  A 10 blade was then used to make an incision at the premarked area which was an ellipse.  This included the previous incision.  The expander was removed.  There was some fluid, it did not appear to be purulent or have malodor.  The Flex HD seem to have been broken down at the anterior portion.  All the Flex HD that was seen was removed.  The pocket was irrigated with copious amounts of saline and antibiotic solution.  Hemostasis was achieved with electrocautery on the phone for purposes of punctate bleeding hemostat was used.  Local was injected for postop pain management.  A drain was placed and secured to the chest with 3-0 silk.  The deep layers were closed with 3-0 Monocryl.  4-0 Monocryl was used to close the superficial layer and then on the skin had a running 5-0 Monocryl placed.  Sterile dressing was applied and a breast binder. The patient was allowed to wake up and taken to recovery room in stable condition at the end of the case. The family was notified at the end of the case.

## 2021-03-19 NOTE — Interval H&P Note (Signed)
History and Physical Interval Note:  03/19/2021 7:35 AM  Jennifer Davis  has presented today for surgery, with the diagnosis of acquired absence of breast.  The various methods of treatment have been discussed with the patient and family. After consideration of risks, benefits and other options for treatment, the patient has consented to  Procedure(s) with comments: Removal of right tissue expander (Right) - 45 min as a surgical intervention.  The patient's history has been reviewed, patient examined, no change in status, stable for surgery.  I have reviewed the patient's chart and labs.  Questions were answered to the patient's satisfaction.     Loel Lofty Cephus Tupy

## 2021-03-19 NOTE — Discharge Instructions (Signed)
INSTRUCTIONS FOR AFTER SURGERY   You will likely have some questions about what to expect following your operation.  The following information will help you and your family understand what to expect when you are discharged from the hospital.  Following these guidelines will help ensure a smooth recovery and reduce risks of complications.  Postoperative instructions include information on: diet, wound care, medications and physical activity.  AFTER SURGERY Expect to go home after the procedure.  In some cases, you may need to spend one night in the hospital for observation.  DIET This surgery does not require a specific diet.  However, I have to mention that the healthier you eat the better your body can start healing. It is important to increasing your protein intake.  This means limiting the foods with added sugar.  Focus on fruits and vegetables and some meat. It is very important to drink water after your surgery.  If your urine is bright yellow, then it is concentrated, and you need to drink more water.  As a general rule after surgery, you should have 8 ounces of water every hour while awake.  If you find you are persistently nauseated or unable to take in liquids let us know.  NO TOBACCO USE or EXPOSURE.  This will slow your healing process and increase the risk of a wound.  WOUND CARE If you have a drain: Clean with baby wipes for 3-5 days and then you can shower.  If you have a binder you may remove it to shower and then put it back on. If you have steri-strips / tape directly attached to your skin leave them in place. It is OK to get these wet.  No baths, pools or hot tubs for two weeks. We close your incision to leave the smallest and best-looking scar. No ointment or creams on your incisions until given the go ahead.  Especially not Neosporin (Too many skin reactions with this one).  A few weeks after surgery you can use Mederma and start massaging the scar. We ask you to wear your binder or  sports bra for the first 6 weeks around the clock, including while sleeping. This provides added comfort and helps reduce the fluid accumulation at the surgery site.  ACTIVITY No heavy lifting until cleared by the doctor.  It is OK to walk and climb stairs. In fact, moving your legs is very important to decrease your risk of a blood clot.  It will also help keep you from getting deconditioned.  Every 1 to 2 hours get up and walk for 5 minutes. This will help with a quicker recovery back to normal.  Let pain be your guide so you don't do too much.  NO, you cannot do the spring cleaning and don't plan on taking care of anyone else.  This is your time for TLC.   WORK Everyone returns to work at different times. As a rough guide, most people take at least 1 - 2 weeks off prior to returning to work. If you need documentation for your job, bring the forms to your postoperative follow up visit.  DRIVING Arrange for someone to bring you home from the hospital.  You may be able to drive a few days after surgery but not while taking any narcotics or valium.  BOWEL MOVEMENTS Constipation can occur after anesthesia and while taking pain medication.  It is important to stay ahead for your comfort.  We recommend taking Milk of Magnesia (2 tablespoons; twice   a day) while taking the pain pills.  SEROMA This is fluid your body tried to put in the surgical site.  This is normal but if it creates excessive pain and swelling let us know.  It usually decreases in a few weeks.  MEDICATIONS and PAIN CONTROL At your preoperative visit for you history and physical you were given the following medications: 1. An antibiotic: Start this medication when you get home and take according to the instructions on the bottle. 2. Zofran 4 mg:  This is to treat nausea and vomiting.  You can take this every 6 hours as needed and only if needed. 3. Norco (hydrocodone/acetaminophen) 5/325 mg:  This is only to be used after you have  taken the motrin or the tylenol. Every 8 hours as needed. Over the counter Medication to take: 4. Ibuprofen (Motrin) 600 mg:  Take this every 6 hours.  If you have additional pain then take 500 mg of the tylenol.  Only take the Norco after you have tried these two. 5. Miralax or stool softener of choice: Take this according to the bottle if you take the Norco.  WHEN TO CALL Call your surgeon's office if any of the following occur: . Fever 101 degrees F or greater . Excessive bleeding or fluid from the incision site. . Pain that increases over time without aid from the medications . Redness, warmth, or pus draining from incision sites . Persistent nausea or inability to take in liquids . Severe misshapen area that underwent the operation.  

## 2021-03-20 ENCOUNTER — Inpatient Hospital Stay: Payer: 59

## 2021-03-20 ENCOUNTER — Inpatient Hospital Stay: Payer: 59 | Admitting: Hematology and Oncology

## 2021-03-20 ENCOUNTER — Encounter (HOSPITAL_COMMUNITY): Payer: Self-pay | Admitting: Plastic Surgery

## 2021-03-24 ENCOUNTER — Telehealth: Payer: Self-pay | Admitting: Plastic Surgery

## 2021-03-24 NOTE — Telephone Encounter (Signed)
Returned patients call. LMVM that we have an open today with Matt at 1:40 and 2:00 this afternoon. Call office back to schedule appointment.

## 2021-03-24 NOTE — Progress Notes (Signed)
Patient Care Team: Wenda Low, MD as PCP - General (Internal Medicine) Rockwell Germany, RN as Oncology Nurse Navigator Mauro Kaufmann, RN as Oncology Nurse Navigator Coralie Keens, MD as Consulting Physician (General Surgery) Nicholas Lose, MD as Consulting Physician (Hematology and Oncology) Gery Pray, MD as Consulting Physician (Radiation Oncology) Gwyndolyn Kaufman, RN as Registered Nurse  DIAGNOSIS:    ICD-10-CM   1. Malignant neoplasm of upper-outer quadrant of left breast in female, estrogen receptor positive ([place])  C50.412    Z17.0     SUMMARY OF ONCOLOGIC HISTORY: Oncology History  Malignant neoplasm of upper-outer quadrant of left breast in female, estrogen receptor positive ([place])  09/19/2020 Initial Diagnosis   Mammogram in 07/2018 showed probably benign bilateral breast masses that were stable on her last mammogram on 07/26/19. Mammogram on 08/28/20 showed the stable bilateral masses, a new 0.9cm mass at the 1 o'clock position in the left breast, and one abnormal left axillary lymph node with 0.6cm cortical thickening. 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    10/02/2020 Genetic Testing   Negative genetic testing: no pathogenic variants detected in [facility] Common-Hereditary Cancers Panel.  Variants of uncertain significance detected in APC c.6918T>A (p.Asp2306Glu) and POLE  c.2612G>C (p.Ser871Thr).  The report date is November 24. 2021.   The Common Hereditary Cancers Panel offered by [facility] includes sequencing and/or deletion duplication testing of the following 48 genes: APC, ATM, AXIN2, BARD1, BMPR1A, BRCA1, BRCA2, BRIP1, CDH1, CDK4, CDKN2A (p14ARF), CDKN2A (p16INK4a), CHEK2, CTNNA1, DICER1, EPCAM (Deletion/duplication testing only), GREM1 (promoter region deletion/duplication testing only), KIT, MEN1, MLH1, MSH2, MSH3, MSH6, MUTYH, NBN, NF1, NHTL1, PALB2,  PDGFRA, PMS2, POLD1, POLE, PTEN, RAD50, RAD51C, RAD51D, RNF43, SDHB, SDHC, SDHD, SMAD4, SMARCA4. STK11, TP53, TSC1, TSC2, and VHL.  The following genes were evaluated for sequence changes only: SDHA and HOXB13 c.251G>A variant only.   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    12/04/2020 Cancer Staging   Staging form: Breast, AJCC 8th Edition - Pathologic stage from 12/04/2020: Stage IA (pT1c, pN1a, cM0, G2, ER+, PR+, HER2-) - Signed by [name], NP on 12/18/2020 Histologic grading system: 3 grade system   01/22/2021 -  Chemotherapy    Patient is on Treatment Plan: BREAST ADJUVANT DOSE DENSE AC Q14D / PACLITAXEL Q7D        CHIEF COMPLIANT: Cycle 1 Taxol  INTERVAL HISTORY: Jennifer Davis is a 45 y.o. with above-mentioned history of left breastcancerwhounderwentbilateral mastectomies with reconstructionand is currently on adjuvant chemotherapy with weekly Taxol after completing 4 cycles of Adriamycin and Cytoxan.She underwent a right breast seroma removal on 03/19/21 with Dr. Marla Roe. She presents to the clinic todayfora toxicity checkandcycle1.   ALLERGIES:  is allergic to bee venom and other.  MEDICATIONS:  Current Outpatient Medications  Medication Sig Dispense Refill  . acetaminophen (TYLENOL) 500 MG tablet TAKE 1 TABLET (500 MG TOTAL) BY MOUTH EVERY 6 (SIX) HOURS AS NEEDED. FOR USE AFTER SURGERY (Patient taking differently: Take 500 mg by mouth every 6 (six) hours as needed for mild pain, fever or headache.) 30 tablet 0  . budesonide-formoterol (SYMBICORT) 160-4.5 MCG/ACT inhaler INHALE 2 PUFFS BY MOUTH 2 TIMES A DAY 10.2 g 5  . busPIRone (BUSPAR) 30 MG  tablet TAKE 1 TABLET (30 MG TOTAL) BY MOUTH 2 (TWO) TIMES DAILY. 60 tablet 5  . cetirizine (ZYRTEC) 10 MG tablet  Take 10 mg by mouth daily.    Marland Kitchen dexamethasone (DECADRON) 4 MG tablet TAKE 1 TABLET (4 MG TOTAL) BY MOUTH DAILY. TAKE 1 TABLET THE DAY AFTER CHEMO AND 1 TABLET 2 DAYS AFTER CHEMO. TAKE WITH FOOD. 8 tablet 0  . diazepam (VALIUM) 2 MG tablet TAKE 1 TABLET (2 MG TOTAL) BY MOUTH EVERY 12 (TWELVE) HOURS AS NEEDED FOR UP TO 15 DAYS FOR MUSCLE SPASMS. 30 tablet 0  . DULoxetine (CYMBALTA) 60 MG capsule TAKE 2 CAPSULES (120 MG TOTAL) BY MOUTH DAILY. 60 capsule 5  . EPINEPHrine (EPIPEN 2-PAK) 0.3 mg/0.3 mL IJ SOAJ injection Inject 0.3 mLs (0.3 mg total) into the muscle once as needed for up to 1 dose (for severe allergic reaction). CAll 911 immediately if you have to use this medicine 1 each 1  . gabapentin (NEURONTIN) 100 MG capsule Take 1 capsule (100 mg total) by mouth 3 (three) times daily as needed. (Patient taking differently: Take 100 mg by mouth 3 (three) times daily as needed (neuropathy).) 90 capsule 1  . HYDROcodone-acetaminophen (NORCO/VICODIN) 5-325 MG tablet TAKE 1 TABLET BY MOUTH EVERY 8 (EIGHT) HOURS AS NEEDED FOR UP TO 5 DAYS FOR SEVERE PAIN. 15 tablet 0  . ibuprofen (ADVIL) 600 MG tablet TAKE 1 TABLET (600 MG TOTAL) BY MOUTH EVERY 6 (SIX) HOURS AS NEEDED FOR MILD PAIN OR MODERATE PAIN. FOR USE AFTER SURGERY 30 tablet 0  . lidocaine-prilocaine (EMLA) cream APPLY TO AFFECTED AREA ONCE (Patient taking differently: Apply 1 application topically as needed (port access).) 30 g 3  . loratadine (CLARITIN) 10 MG tablet Take 10 mg by mouth daily.    Marland Kitchen LORazepam (ATIVAN) 0.5 MG tablet Take 0.5 mg by mouth at bedtime as needed (nausea).    . melatonin 5 MG TABS Take 5 mg by mouth at bedtime as needed (sleep).    . montelukast (SINGULAIR) 10 MG tablet Take 10 mg by mouth at bedtime.  5  . Multiple Vitamin (MULTIVITAMIN WITH MINERALS) TABS tablet Take 1 tablet by mouth daily.    . Multiple Vitamins-Minerals (MULTIVITAMIN PO) Take 2 tablets by mouth daily. Skin/hair/nail supplement.    . ondansetron  (ZOFRAN-ODT) 4 MG disintegrating tablet TAKE 1 TABLET (4 MG TOTAL) BY MOUTH EVERY 8 (EIGHT) HOURS AS NEEDED FOR NAUSEA OR VOMITING. 20 tablet 0  . PROAIR HFA 108 (90 Base) MCG/ACT inhaler Inhale 2 puffs into the lungs as needed for wheezing or shortness of breath.  0  . prochlorperazine (COMPAZINE) 10 MG tablet TAKE 1 TABLET (10 MG TOTAL) BY MOUTH EVERY 6 (SIX) HOURS AS NEEDED (NAUSEA OR VOMITING). 30 tablet 1  . sulfamethoxazole-trimethoprim (BACTRIM DS) 800-160 MG tablet Take 1 tablet by mouth 2 (two) times daily. 28 tablet 0  . tamoxifen (NOLVADEX) 10 MG tablet TAKE 1 TABLET (10 MG TOTAL) BY MOUTH DAILY. 30 tablet 0   No current facility-administered medications for this visit.    PHYSICAL EXAMINATION: ECOG PERFORMANCE STATUS: 1 - Symptomatic but completely ambulatory  Vitals:   03/25/21 0809  BP: (!) 145/72  Pulse: (!) 107  Resp: 18  Temp: (!) 97.4 F (36.3 C)  SpO2: 100%   Filed Weights   03/25/21 0809  Weight: 219 lb 4.8 oz (99.5 kg)    LABORATORY DATA:  I have reviewed the data as listed CMP Latest Ref Rng & Units 03/05/2021 02/19/2021 02/05/2021  Glucose 70 - 99 mg/dL 139(H) 124(H) 131(H)  BUN 6 - 20 mg/dL '7 13 9  ' Creatinine 0.44 - 1.00 mg/dL 0.78 0.82 0.72  Sodium 135 - 145 mmol/L 141 141 140  Potassium 3.5 - 5.1 mmol/L 3.6 3.6 3.4(L)  Chloride 98 - 111 mmol/L 107 106 106  CO2 22 - 32 mmol/L 23 21(L) 22  Calcium 8.9 - 10.3 mg/dL 8.7(L) 8.8(L) 8.5(L)  Total Protein 6.5 - 8.1 g/dL 6.6 6.8 6.8  Total Bilirubin 0.3 - 1.2 mg/dL <0.2(L) <0.2(L) <0.2(L)  Alkaline Phos 38 - 126 U/L 109 116 118  AST 15 - 41 U/L 19 14(L) 12(L)  ALT 0 - 44 U/L '24 17 16    ' Lab Results  Component Value Date   WBC 12.1 (H) 03/25/2021   HGB 8.5 (L) 03/25/2021   HCT 28.1 (L) 03/25/2021   MCV 86.2 03/25/2021   PLT 399 03/25/2021   NEUTROABS 8.7 (H) 03/25/2021    ASSESSMENT & PLAN:  Malignant neoplasm of upper-outer quadrant of left breast in female, estrogen receptor positive  ([place]) 09/20/2019: Screening mammogram on 08/28/20 showed the stable bilateral masses, a new 0.9cm mass at the 1 o'clock position in the left breast, and one abnormal left axillary lymph node with 0.6cm cortical thickening. 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 MammaPrint: High risk: Luminal type B, 5-year metastasis free survival with chemo and hormone therapy: 93%, absolute benefit from chemo greater than 12%, average 10-year risk of recurrence untreated: 29%  Treatment plan: 1.Mastectomywith targeted node dissection 2.adjuvant chemotherapy with dose dense Adriamycin and Cytoxan followed by Taxol 3.Adjuvant radiation 4.Followed by adjuvant antiestrogen therapy ------------------------------------------------------------------------------------------------------------- Bilateral mastectomies with reconstruction [name]):  Right breast: no malignancy  Left breast: invasive and in situ ductal carcinoma, 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 -------------------------------------------------------------------------------------------------- Current Treatment: Completed 4 cycles of dose dense Adriamycin and Cytoxan, tomorrow is cycle 1 Taxol 12/30/20: ECHO EF 65-70% Labs reviewed  Chemo Toxicities: 1.Fatigue 2.Nausea: Improved 3.Anemia: Hemoglobin 8.5: On oral iron therapy 4.  Constipation: Patient is taking more fiber in her diet and its helped her.  Seroma: She underwent drainage of the seroma on 03/19/2021 by Dr. Marla Roe and she now has a drain in place.  RTC in1 week for cycle 2 Taxol    No orders of the defined types were placed in this encounter.  The patient has a good understanding of the overall plan. she agrees with it. she will call with any problems that may develop before the next visit here.  Total time  spent: 30 mins including face to face time and time spent for planning, charting and coordination of care  Rulon Eisenmenger, MD, MPH 03/25/2021  I, Cloyde Reams Dorshimer, am acting as scribe for Dr. Nicholas Lose.  I have reviewed the above documentation for accuracy and completeness, and I agree with the above.

## 2021-03-24 NOTE — Telephone Encounter (Signed)
Patient called to say that the drain is not keeping suction since she came in last Wednesday. She said it keeps reinflating instead of being deflated when there is no fluid. She thinks there may be a leak, but cannot tell where it is at or if that is the issue. Please call to advise if that is okay.

## 2021-03-25 ENCOUNTER — Other Ambulatory Visit: Payer: Self-pay

## 2021-03-25 ENCOUNTER — Inpatient Hospital Stay (HOSPITAL_BASED_OUTPATIENT_CLINIC_OR_DEPARTMENT_OTHER): Payer: 59 | Admitting: Hematology and Oncology

## 2021-03-25 ENCOUNTER — Inpatient Hospital Stay: Payer: 59 | Attending: Hematology and Oncology

## 2021-03-25 ENCOUNTER — Other Ambulatory Visit (HOSPITAL_COMMUNITY): Payer: Self-pay

## 2021-03-25 DIAGNOSIS — C773 Secondary and unspecified malignant neoplasm of axilla and upper limb lymph nodes: Secondary | ICD-10-CM | POA: Diagnosis not present

## 2021-03-25 DIAGNOSIS — C50412 Malignant neoplasm of upper-outer quadrant of left female breast: Secondary | ICD-10-CM | POA: Insufficient documentation

## 2021-03-25 DIAGNOSIS — Z5111 Encounter for antineoplastic chemotherapy: Secondary | ICD-10-CM | POA: Diagnosis not present

## 2021-03-25 DIAGNOSIS — Z170 Estrogen receptor positive status [ER+]: Secondary | ICD-10-CM | POA: Insufficient documentation

## 2021-03-25 DIAGNOSIS — Z95828 Presence of other vascular implants and grafts: Secondary | ICD-10-CM

## 2021-03-25 LAB — CBC WITH DIFFERENTIAL/PLATELET
Abs Immature Granulocytes: 0.96 10*3/uL — ABNORMAL HIGH (ref 0.00–0.07)
Basophils Absolute: 0.1 10*3/uL (ref 0.0–0.1)
Basophils Relative: 1 %
Eosinophils Absolute: 0.1 10*3/uL (ref 0.0–0.5)
Eosinophils Relative: 0 %
HCT: 28.1 % — ABNORMAL LOW (ref 36.0–46.0)
Hemoglobin: 8.5 g/dL — ABNORMAL LOW (ref 12.0–15.0)
Immature Granulocytes: 8 %
Lymphocytes Relative: 12 %
Lymphs Abs: 1.4 10*3/uL (ref 0.7–4.0)
MCH: 26.1 pg (ref 26.0–34.0)
MCHC: 30.2 g/dL (ref 30.0–36.0)
MCV: 86.2 fL (ref 80.0–100.0)
Monocytes Absolute: 0.9 10*3/uL (ref 0.1–1.0)
Monocytes Relative: 7 %
Neutro Abs: 8.7 10*3/uL — ABNORMAL HIGH (ref 1.7–7.7)
Neutrophils Relative %: 72 %
Platelets: 399 10*3/uL (ref 150–400)
RBC: 3.26 MIL/uL — ABNORMAL LOW (ref 3.87–5.11)
RDW: 25.2 % — ABNORMAL HIGH (ref 11.5–15.5)
WBC: 12.1 10*3/uL — ABNORMAL HIGH (ref 4.0–10.5)
nRBC: 5.4 % — ABNORMAL HIGH (ref 0.0–0.2)

## 2021-03-25 LAB — CMP (CANCER CENTER ONLY)
ALT: 56 U/L — ABNORMAL HIGH (ref 0–44)
AST: 32 U/L (ref 15–41)
Albumin: 3.5 g/dL (ref 3.5–5.0)
Alkaline Phosphatase: 121 U/L (ref 38–126)
Anion gap: 11 (ref 5–15)
BUN: 7 mg/dL (ref 6–20)
CO2: 23 mmol/L (ref 22–32)
Calcium: 9 mg/dL (ref 8.9–10.3)
Chloride: 105 mmol/L (ref 98–111)
Creatinine: 0.77 mg/dL (ref 0.44–1.00)
GFR, Estimated: >60 mL/min (ref >60)
Glucose, Bld: 126 mg/dL — ABNORMAL HIGH (ref 70–99)
Potassium: 3.4 mmol/L — ABNORMAL LOW (ref 3.5–5.1)
Sodium: 139 mmol/L (ref 135–145)
Total Bilirubin: <0.2 mg/dL — ABNORMAL LOW (ref 0.3–1.2)
Total Protein: 6.5 g/dL (ref 6.5–8.1)

## 2021-03-25 MED ORDER — HEPARIN SOD (PORK) LOCK FLUSH 100 UNIT/ML IV SOLN
500.0000 [IU] | Freq: Once | INTRAVENOUS | Status: AC
  Administered 2021-03-25: 500 [IU]
  Filled 2021-03-25: qty 5

## 2021-03-25 MED ORDER — SODIUM CHLORIDE 0.9% FLUSH
10.0000 mL | Freq: Once | INTRAVENOUS | Status: AC
  Administered 2021-03-25: 10 mL
  Filled 2021-03-25: qty 10

## 2021-03-25 MED FILL — Budesonide-Formoterol Fumarate Dihyd Aerosol 160-4.5 MCG/ACT: RESPIRATORY_TRACT | 30 days supply | Qty: 10.2 | Fill #0 | Status: AC

## 2021-03-25 NOTE — Assessment & Plan Note (Signed)
09/20/2019: Screening mammogram on 08/28/20 showed the stable bilateral masses, a new 0.9cm mass at the 1 o'clock position in the left breast, and one abnormal left axillary lymph node with 0.6cm cortical thickening. 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 MammaPrint: High risk: Luminal type B, 5-year metastasis free survival with chemo and hormone therapy: 93%, absolute benefit from chemo greater than 12%, average 10-year risk of recurrence untreated: 29%  Treatment plan: 1.Mastectomywith targeted node dissection 2.adjuvant chemotherapy with dose dense Adriamycin and Cytoxan followed by Taxol 3.Adjuvant radiation 4.Followed by adjuvant antiestrogen therapy ------------------------------------------------------------------------------------------------------------- Bilateral mastectomies with reconstruction [name]):  Right breast: no malignancy  Left breast: invasive and in situ ductal carcinoma, 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 -------------------------------------------------------------------------------------------------- Current Treatment: Completed 4 cycles of dose dense Adriamycin and Cytoxan, today cycle 1 Taxol 12/30/20: ECHO EF 65-70% Labs reviewed  Chemo Toxicities: 1.Fatigue 2.Nausea: Improved 3.Anemia: Hemoglobin 8.5: On oral iron therapy 4.  Constipation: Patient is taking more fiber in her diet and its helped her.  Encouraged her to eat more protein in diet.  RTC in1 week for cycle 2 Taxol

## 2021-03-26 ENCOUNTER — Other Ambulatory Visit: Payer: Self-pay | Admitting: Hematology and Oncology

## 2021-03-26 ENCOUNTER — Inpatient Hospital Stay: Payer: 59

## 2021-03-26 ENCOUNTER — Other Ambulatory Visit (HOSPITAL_COMMUNITY): Payer: Self-pay

## 2021-03-26 VITALS — BP 127/73 | HR 103 | Temp 98.8°F | Resp 18

## 2021-03-26 DIAGNOSIS — Z170 Estrogen receptor positive status [ER+]: Secondary | ICD-10-CM

## 2021-03-26 DIAGNOSIS — C50412 Malignant neoplasm of upper-outer quadrant of left female breast: Secondary | ICD-10-CM

## 2021-03-26 DIAGNOSIS — Z5111 Encounter for antineoplastic chemotherapy: Secondary | ICD-10-CM | POA: Diagnosis not present

## 2021-03-26 DIAGNOSIS — C773 Secondary and unspecified malignant neoplasm of axilla and upper limb lymph nodes: Secondary | ICD-10-CM | POA: Diagnosis not present

## 2021-03-26 MED ORDER — SODIUM CHLORIDE 0.9 % IV SOLN
Freq: Once | INTRAVENOUS | Status: AC
Start: 2021-03-26 — End: 2021-03-26
  Filled 2021-03-26: qty 250

## 2021-03-26 MED ORDER — FAMOTIDINE 20 MG IN NS 100 ML IVPB
20.0000 mg | Freq: Once | INTRAVENOUS | Status: AC
Start: 2021-03-26 — End: 2021-03-26
  Administered 2021-03-26: 20 mg via INTRAVENOUS

## 2021-03-26 MED ORDER — SODIUM CHLORIDE 0.9% FLUSH
10.0000 mL | INTRAVENOUS | Status: DC | PRN
  Administered 2021-03-26: 10 mL
  Filled 2021-03-26: qty 10

## 2021-03-26 MED ORDER — FAMOTIDINE 20 MG IN NS 100 ML IVPB
INTRAVENOUS | Status: AC
  Filled 2021-03-26: qty 100

## 2021-03-26 MED ORDER — NYSTATIN 100000 UNIT/ML MT SUSP
5.0000 mL | Freq: Four times a day (QID) | OROMUCOSAL | 0 refills | Status: DC
  Filled 2021-03-26: qty 60, 3d supply, fill #0

## 2021-03-26 MED ORDER — HEPARIN SOD (PORK) LOCK FLUSH 100 UNIT/ML IV SOLN
500.0000 [IU] | Freq: Once | INTRAVENOUS | Status: AC | PRN
  Administered 2021-03-26: 500 [IU]
  Filled 2021-03-26: qty 5

## 2021-03-26 MED ORDER — SODIUM CHLORIDE 0.9 % IV SOLN
80.0000 mg/m2 | Freq: Once | INTRAVENOUS | Status: AC
  Administered 2021-03-26: 150 mg via INTRAVENOUS
  Filled 2021-03-26: qty 25

## 2021-03-26 MED ORDER — DIPHENHYDRAMINE HCL 50 MG/ML IJ SOLN
INTRAMUSCULAR | Status: AC
  Filled 2021-03-26: qty 1

## 2021-03-26 MED ORDER — SODIUM CHLORIDE 0.9 % IV SOLN
20.0000 mg | Freq: Once | INTRAVENOUS | Status: AC
  Administered 2021-03-26: 20 mg via INTRAVENOUS
  Filled 2021-03-26: qty 20

## 2021-03-26 MED ORDER — DIPHENHYDRAMINE HCL 50 MG/ML IJ SOLN
50.0000 mg | Freq: Once | INTRAMUSCULAR | Status: AC
  Administered 2021-03-26: 50 mg via INTRAVENOUS

## 2021-03-26 NOTE — Patient Instructions (Signed)
[facility] MEDICAL [facility]COLOG[facility]   Discharge Instructions: Thank you for choosing [facility] to provide your oncology and hematology care.   If you have a lab appointment with the Cancer Center, please go directly to the Cancer Center and check in at the registration area.   Wear comfortable clothing and clothing appropriate for easy access to any Portacath or PICC line.   We strive to give you quality time with your provider. You may need to reschedule your appointment if you arrive late (15 or more minutes).  Arriving late affects you and other patients whose appointments are after yours.  Also, if you miss three or more appointments without notifying the office, you may be dismissed from the clinic at the provider's discretion.      For prescription refill requests, have your pharmacy contact our office and allow 72 hours for refills to be completed.    Today you received the following chemotherapy and/or immunotherapy agents: paclitaxel.      To help prevent nausea and vomiting after your treatment, we encourage you to take your nausea medication as directed.  BELOW ARE SYMPTOMS THAT SHOULD BE REPORTED IMMEDIATELY: *FEVER GREATER THAN 100.4 F (38 C) OR HIGHER *CHILLS OR SWEATING *NAUSEA AND VOMITING THAT IS NOT CONTROLLED WITH YOUR NAUSEA MEDICATION *UNUSUAL SHORTNESS OF BREATH *UNUSUAL BRUISING OR BLEEDING *URINARY PROBLEMS (pain or burning when urinating, or frequent urination) *BOWEL PROBLEMS (unusual diarrhea, constipation, pain near the anus) TENDERNESS IN MOUTH AND THROAT WITH OR WITHOUT PRESENCE OF ULCERS (sore throat, sores in mouth, or a toothache) UNUSUAL RASH, SWELLING OR PAIN  UNUSUAL VAGINAL DISCHARGE OR ITCHING   Items with * indicate a potential emergency and should be followed up as soon as possible or go to the Emergency Department if any problems should occur.  Please show the CHEMOTHERAPY ALERT CARD or IMMUNOTHERAPY ALERT CARD at check-in  to the Emergency Department and triage nurse.  Should you have questions after your visit or need to cancel or reschedule your appointment, please contact [facility]  Dept: [phone]  and follow the prompts.  Office hours are 8:00 a.m. to 4:30 p.m. Monday - Friday. Please note that voicemails left after 4:00 p.m. may not be returned until the following business day.  We are closed weekends and major holidays. You have access to a nurse at all times for urgent questions. Please call the main number to the clinic Dept: [phone] and follow the prompts.   For any non-urgent questions, you may also contact your provider using MyChart. We now offer e-Visits for anyone 18 and older to request care online for non-urgent symptoms. For details visit [email or web address].   Also download the MyChart app! Go to the app store, search "MyChart", open the app, select [facility], and log in with your MyChart username and password.  Due to Covid, a mask is required upon entering the hospital/clinic. If you do not have a mask, one will be given to you upon arrival. For doctor visits, patients may have 1 support person aged 18 or older with them. For treatment visits, patients cannot have anyone with them due to current Covid guidelines and our immunocompromised population.   

## 2021-03-27 ENCOUNTER — Other Ambulatory Visit: Payer: 59

## 2021-03-27 ENCOUNTER — Ambulatory Visit: Payer: 59 | Admitting: Hematology and Oncology

## 2021-03-27 ENCOUNTER — Ambulatory Visit: Payer: 59

## 2021-03-27 ENCOUNTER — Encounter: Payer: Self-pay | Admitting: *Deleted

## 2021-03-28 ENCOUNTER — Other Ambulatory Visit: Payer: Self-pay

## 2021-03-28 ENCOUNTER — Ambulatory Visit (INDEPENDENT_AMBULATORY_CARE_PROVIDER_SITE_OTHER): Payer: 59 | Admitting: Plastic Surgery

## 2021-03-28 ENCOUNTER — Telehealth: Payer: Self-pay

## 2021-03-28 DIAGNOSIS — S21001A Unspecified open wound of right breast, initial encounter: Secondary | ICD-10-CM | POA: Insufficient documentation

## 2021-03-28 DIAGNOSIS — Z9013 Acquired absence of bilateral breasts and nipples: Secondary | ICD-10-CM

## 2021-03-28 NOTE — Telephone Encounter (Signed)
Faxed [facility] order: mepilex border 2x2 guaze, xeroform 1/8 change daily

## 2021-03-28 NOTE — Progress Notes (Signed)
The patient is a 45 year old female here for follow-up.  We removed her right breast expander.  Her drain is in and working although it has been giving her some trouble.  There may be some opening of the skin.  If this continues we will need to excise more skin and close.  The patient is aware.  She would like to give it a little time.  She is going to use Xeroform and Mepilex border dressing.  We will get some sent to her.  If it gets any worse I want to see her sooner.  We will also see her next week.  It does not look infected and looks much better than it did a week ago.

## 2021-04-02 MED FILL — Dexamethasone Sodium Phosphate Inj 100 MG/10ML: INTRAMUSCULAR | Qty: 2 | Status: AC

## 2021-04-02 NOTE — Progress Notes (Signed)
Patient Care Team: Wenda Low, MD as PCP - General (Internal Medicine) Rockwell Germany, RN as Oncology Nurse Navigator Mauro Kaufmann, RN as Oncology Nurse Navigator Coralie Keens, MD as Consulting Physician (General Surgery) Nicholas Lose, MD as Consulting Physician (Hematology and Oncology) Gery Pray, MD as Consulting Physician (Radiation Oncology) Gwyndolyn Kaufman, RN as Registered Nurse  DIAGNOSIS:    ICD-10-CM   1. Malignant neoplasm of upper-outer quadrant of left breast in female, estrogen receptor positive ([place])  C50.412    Z17.0     SUMMARY OF ONCOLOGIC HISTORY: Oncology History  Malignant neoplasm of upper-outer quadrant of left breast in female, estrogen receptor positive ([place])  09/19/2020 Initial Diagnosis   Mammogram in 07/2018 showed probably benign bilateral breast masses that were stable on her last mammogram on 07/26/19. Mammogram on 08/28/20 showed the stable bilateral masses, a new 0.9cm mass at the 1 o'clock position in the left breast, and one abnormal left axillary lymph node with 0.6cm cortical thickening. 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    10/02/2020 Genetic Testing   Negative genetic testing: no pathogenic variants detected in [facility] Common-Hereditary Cancers Panel.  Variants of uncertain significance detected in APC c.6918T>A (p.Asp2306Glu) and POLE  c.2612G>C (p.Ser871Thr).  The report date is November 24. 2021.   The Common Hereditary Cancers Panel offered by [facility] includes sequencing and/or deletion duplication testing of the following 48 genes: APC, ATM, AXIN2, BARD1, BMPR1A, BRCA1, BRCA2, BRIP1, CDH1, CDK4, CDKN2A (p14ARF), CDKN2A (p16INK4a), CHEK2, CTNNA1, DICER1, EPCAM (Deletion/duplication testing only), GREM1 (promoter region deletion/duplication testing only), KIT, MEN1, MLH1, MSH2, MSH3, MSH6, MUTYH, NBN, NF1, NHTL1, PALB2,  PDGFRA, PMS2, POLD1, POLE, PTEN, RAD50, RAD51C, RAD51D, RNF43, SDHB, SDHC, SDHD, SMAD4, SMARCA4. STK11, TP53, TSC1, TSC2, and VHL.  The following genes were evaluated for sequence changes only: SDHA and HOXB13 c.251G>A variant only.   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    12/04/2020 Cancer Staging   Staging form: Breast, AJCC 8th Edition - Pathologic stage from 12/04/2020: Stage IA (pT1c, pN1a, cM0, G2, ER+, PR+, HER2-) - Signed by [name], NP on 12/18/2020 Histologic grading system: 3 grade system   01/22/2021 -  Chemotherapy    Patient is on Treatment Plan: BREAST ADJUVANT DOSE DENSE AC Q14D / PACLITAXEL Q7D        CHIEF COMPLIANT: Cycle 2 Taxol  INTERVAL HISTORY: Jennifer Davis is a 45 y.o. with above-mentioned history of left breastcancerwhounderwentbilateral mastectomies with reconstructionand is currently on adjuvant chemotherapy with weekly Taxol after completing 4 cycles of Adriamycin and Cytoxan. She presents to the clinic todayfora toxicity checkandcycle2.  her biggest complaints are related to fluid drainage from the surgical incision.  She has a drain in place but the drain is not draining any fluid.  She also felt extremely tired after chemotherapy but did not have much nausea or vomiting.  ALLERGIES:  is allergic to bee venom and other.  MEDICATIONS:  Current Outpatient Medications  Medication Sig Dispense Refill  . acetaminophen (TYLENOL) 500 MG tablet TAKE 1 TABLET (500 MG TOTAL) BY MOUTH EVERY 6 (SIX) HOURS AS NEEDED. FOR USE AFTER SURGERY (Patient taking differently: Take 500 mg by mouth every 6 (six) hours as needed for mild  pain, fever or headache.) 30 tablet 0  . budesonide-formoterol (SYMBICORT) 160-4.5 MCG/ACT inhaler INHALE 2 PUFFS BY MOUTH 2  TIMES A DAY 10.2 g 5  . busPIRone (BUSPAR) 30 MG tablet TAKE 1 TABLET (30 MG TOTAL) BY MOUTH 2 (TWO) TIMES DAILY. 60 tablet 5  . cetirizine (ZYRTEC) 10 MG tablet Take 10 mg by mouth daily.    . diazepam (VALIUM) 2 MG tablet TAKE 1 TABLET (2 MG TOTAL) BY MOUTH EVERY 12 (TWELVE) HOURS AS NEEDED FOR UP TO 15 DAYS FOR MUSCLE SPASMS. 30 tablet 0  . DULoxetine (CYMBALTA) 60 MG capsule TAKE 2 CAPSULES (120 MG TOTAL) BY MOUTH DAILY. 60 capsule 5  . EPINEPHrine (EPIPEN 2-PAK) 0.3 mg/0.3 mL IJ SOAJ injection Inject 0.3 mLs (0.3 mg total) into the muscle once as needed for up to 1 dose (for severe allergic reaction). CAll 911 immediately if you have to use this medicine 1 each 1  . gabapentin (NEURONTIN) 100 MG capsule Take 1 capsule (100 mg total) by mouth 3 (three) times daily as needed. (Patient taking differently: Take 100 mg by mouth 3 (three) times daily as needed (neuropathy).) 90 capsule 1  . HYDROcodone-acetaminophen (NORCO/VICODIN) 5-325 MG tablet TAKE 1 TABLET BY MOUTH EVERY 8 (EIGHT) HOURS AS NEEDED FOR UP TO 5 DAYS FOR SEVERE PAIN. (Patient not taking: Reported on 03/28/2021) 15 tablet 0  . ibuprofen (ADVIL) 600 MG tablet TAKE 1 TABLET (600 MG TOTAL) BY MOUTH EVERY 6 (SIX) HOURS AS NEEDED FOR MILD PAIN OR MODERATE PAIN. FOR USE AFTER SURGERY 30 tablet 0  . lidocaine-prilocaine (EMLA) cream APPLY TO AFFECTED AREA ONCE (Patient taking differently: Apply 1 application topically as needed (port access).) 30 g 3  . loratadine (CLARITIN) 10 MG tablet Take 10 mg by mouth daily.    Marland Kitchen LORazepam (ATIVAN) 0.5 MG tablet Take 0.5 mg by mouth at bedtime as needed (nausea).    . melatonin 5 MG TABS Take 5 mg by mouth at bedtime as needed (sleep).    . montelukast (SINGULAIR) 10 MG tablet Take 10 mg by mouth at bedtime.  5  . Multiple Vitamin (MULTIVITAMIN WITH MINERALS) TABS tablet Take 1 tablet by mouth daily.    . Multiple Vitamins-Minerals (MULTIVITAMIN PO) Take 2 tablets by mouth daily. Skin/hair/nail  supplement.    . nystatin (MYCOSTATIN) 100000 UNIT/ML suspension Take 5 mLs (500,000 Units total) by mouth 4 (four) times daily. 60 mL 0  . ondansetron (ZOFRAN-ODT) 4 MG disintegrating tablet TAKE 1 TABLET (4 MG TOTAL) BY MOUTH EVERY 8 (EIGHT) HOURS AS NEEDED FOR NAUSEA OR VOMITING. 20 tablet 0  . PROAIR HFA 108 (90 Base) MCG/ACT inhaler Inhale 2 puffs into the lungs as needed for wheezing or shortness of breath.  0  . prochlorperazine (COMPAZINE) 10 MG tablet TAKE 1 TABLET (10 MG TOTAL) BY MOUTH EVERY 6 (SIX) HOURS AS NEEDED (NAUSEA OR VOMITING). 30 tablet 1  . sulfamethoxazole-trimethoprim (BACTRIM DS) 800-160 MG tablet Take 1 tablet by mouth 2 (two) times daily. 28 tablet 0  . tamoxifen (NOLVADEX) 10 MG tablet TAKE 1 TABLET (10 MG TOTAL) BY MOUTH DAILY. (Patient not taking: Reported on 03/28/2021) 30 tablet 0   No current facility-administered medications for this visit.   Facility-Administered Medications Ordered in Other Visits  Medication Dose Route Frequency Provider Last Rate Last Admin  . 0.9 %  sodium chloride infusion   Intravenous Once Nicholas Lose, MD      . dexamethasone (DECADRON) 10 mg in sodium chloride 0.9 % 50  mL IVPB  10 mg Intravenous Once Nicholas Lose, MD      . diphenhydrAMINE (BENADRYL) injection 25 mg  25 mg Intravenous Once Nicholas Lose, MD      . famotidine (PEPCID) IVPB 20 mg in NS 100 mL IVPB  20 mg Intravenous Once Nicholas Lose, MD      . heparin lock flush 100 unit/mL  500 Units Intracatheter Once PRN Nicholas Lose, MD      . PACLitaxel (TAXOL) 150 mg in sodium chloride 0.9 % 250 mL chemo infusion (</= 48m/m2)  80 mg/m2 (Order-Specific) Intravenous Once GNicholas Lose MD      . sodium chloride flush (NS) 0.9 % injection 10 mL  10 mL Intracatheter PRN GNicholas Lose MD        PHYSICAL EXAMINATION: ECOG PERFORMANCE STATUS: 1 - Symptomatic but completely ambulatory  Vitals:   04/03/21 1333  BP: (!) 150/68  Pulse: (!) 108  Resp: 18  Temp: 97.6 F (36.4 C)   SpO2: 100%   Filed Weights   04/03/21 1333  Weight: 221 lb 1.6 oz (100.3 kg)    LABORATORY DATA:  I have reviewed the data as listed CMP Latest Ref Rng & Units 04/03/2021 03/25/2021 03/05/2021  Glucose 70 - 99 mg/dL 133(H) 126(H) 139(H)  BUN 6 - 20 mg/dL _0 Creatinine 0.44 - 1.00 mg/dL 0.74 0.77 0.78  Sodium 135 - 145 mmol/L 140 139 141  Potassium 3.5 - 5.1 mmol/L 3.6 3.4(L) 3.6  Chloride 98 - 111 mmol/L 104 105 107  CO2 22 - 32 mmol/L _1 Calcium 8.9 - 10.3 mg/dL 9.0 9.0 8.7(L)  Total Protein 6.5 - 8.1 g/dL 6.7 6.5 6.6  Total Bilirubin 0.3 - 1.2 mg/dL 0.3 <0.2(L) <0.2(L)  Alkaline Phos 38 - 126 U/L 93 121 109  AST 15 - 41 U/L 48(H) 32 19  ALT 0 - 44 U/L 70(H) 56(H) 24    Lab Results  Component Value Date   WBC 9.7 04/03/2021   HGB 8.5 (L) 04/03/2021   HCT 27.1 (L) 04/03/2021   MCV 85.2 04/03/2021   PLT 461 (H) 04/03/2021   NEUTROABS 6.6 04/03/2021    ASSESSMENT & PLAN:  Malignant neoplasm of upper-outer quadrant of left breast in female, estrogen receptor positive ([place] 09/20/2019: Screening mammogram on 08/28/20 showed the stable bilateral masses, a new 0.9cm mass at the 1 o'clock position in the left breast, and one abnormal left axillary lymph node with 0.6cm cortical thickening. 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 MammaPrint: High risk: Luminal type B, 5-year metastasis free survival with chemo and hormone therapy: 93%, absolute benefit from chemo greater than 12%, average 10-year risk of recurrence untreated: 29%  Treatment plan: 1.Mastectomywith targeted node dissection 2.adjuvant chemotherapy with dose dense Adriamycin and Cytoxan followed by Taxol 3.Adjuvant radiation 4.Followed by adjuvant antiestrogen therapy ------------------------------------------------------------------------------------------------------------- Bilateral  mastectomies with reconstruction ([name]:  Right breast: no malignancy  Left breast: invasive and in situ ductal carcinoma, 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 -------------------------------------------------------------------------------------------------- Current Treatment: Completed 4 cycles of dose dense Adriamycin and Cytoxan,  today cycle 2 of Taxol 12/30/20: ECHO EF 65-70% Labs reviewed  Chemo Toxicities: 1.Fatigue 2.Nausea: Improved 3.Anemia: Hemoglobin8.5: On oral iron therapy 4.Constipation: Patient is taking more fiber in her diet and its helped her.  Seroma: She underwent drainage of the seroma on 03/19/2021 by  Dr. Marla Roe and she now has a drain in place.  She has an appointment to see plastic surgery tomorrow.  She tells me that she is leaking from the incision.  RTC weekly for Taxol and every other week for follow-up with me.    No orders of the defined types were placed in this encounter.  The patient has a good understanding of the overall plan. she agrees with it. she will call with any problems that may develop before the next visit here.  Total time spent: 30 mins including face to face time and time spent for planning, charting and coordination of care  Rulon Eisenmenger, MD, MPH 04/03/2021  I, Cloyde Reams Dorshimer, am acting as scribe for Dr. Nicholas Lose.  I have reviewed the above documentation for accuracy and completeness, and I agree with the above.

## 2021-04-03 ENCOUNTER — Encounter: Payer: Self-pay | Admitting: *Deleted

## 2021-04-03 ENCOUNTER — Inpatient Hospital Stay: Payer: 59

## 2021-04-03 ENCOUNTER — Inpatient Hospital Stay (HOSPITAL_BASED_OUTPATIENT_CLINIC_OR_DEPARTMENT_OTHER): Payer: 59 | Admitting: Hematology and Oncology

## 2021-04-03 ENCOUNTER — Other Ambulatory Visit: Payer: Self-pay

## 2021-04-03 VITALS — HR 103

## 2021-04-03 DIAGNOSIS — C50412 Malignant neoplasm of upper-outer quadrant of left female breast: Secondary | ICD-10-CM

## 2021-04-03 DIAGNOSIS — Z5111 Encounter for antineoplastic chemotherapy: Secondary | ICD-10-CM | POA: Diagnosis not present

## 2021-04-03 DIAGNOSIS — Z95828 Presence of other vascular implants and grafts: Secondary | ICD-10-CM

## 2021-04-03 DIAGNOSIS — Z170 Estrogen receptor positive status [ER+]: Secondary | ICD-10-CM

## 2021-04-03 DIAGNOSIS — C773 Secondary and unspecified malignant neoplasm of axilla and upper limb lymph nodes: Secondary | ICD-10-CM | POA: Diagnosis not present

## 2021-04-03 LAB — CMP (CANCER CENTER ONLY)
ALT: 70 U/L — ABNORMAL HIGH (ref 0–44)
AST: 48 U/L — ABNORMAL HIGH (ref 15–41)
Albumin: 3.5 g/dL (ref 3.5–5.0)
Alkaline Phosphatase: 93 U/L (ref 38–126)
Anion gap: 9 (ref 5–15)
BUN: 11 mg/dL (ref 6–20)
CO2: 27 mmol/L (ref 22–32)
Calcium: 9 mg/dL (ref 8.9–10.3)
Chloride: 104 mmol/L (ref 98–111)
Creatinine: 0.74 mg/dL (ref 0.44–1.00)
GFR, Estimated: >60 mL/min (ref >60)
Glucose, Bld: 133 mg/dL — ABNORMAL HIGH (ref 70–99)
Potassium: 3.6 mmol/L (ref 3.5–5.1)
Sodium: 140 mmol/L (ref 135–145)
Total Bilirubin: 0.3 mg/dL (ref 0.3–1.2)
Total Protein: 6.7 g/dL (ref 6.5–8.1)

## 2021-04-03 LAB — CBC WITH DIFFERENTIAL/PLATELET
Abs Immature Granulocytes: 0.45 10*3/uL — ABNORMAL HIGH (ref 0.00–0.07)
Basophils Absolute: 0.1 10*3/uL (ref 0.0–0.1)
Basophils Relative: 1 %
Eosinophils Absolute: 0.4 10*3/uL (ref 0.0–0.5)
Eosinophils Relative: 4 %
HCT: 27.1 % — ABNORMAL LOW (ref 36.0–46.0)
Hemoglobin: 8.5 g/dL — ABNORMAL LOW (ref 12.0–15.0)
Immature Granulocytes: 5 %
Lymphocytes Relative: 14 %
Lymphs Abs: 1.4 10*3/uL (ref 0.7–4.0)
MCH: 26.7 pg (ref 26.0–34.0)
MCHC: 31.4 g/dL (ref 30.0–36.0)
MCV: 85.2 fL (ref 80.0–100.0)
Monocytes Absolute: 0.8 10*3/uL (ref 0.1–1.0)
Monocytes Relative: 8 %
Neutro Abs: 6.6 10*3/uL (ref 1.7–7.7)
Neutrophils Relative %: 68 %
Platelets: 461 10*3/uL — ABNORMAL HIGH (ref 150–400)
RBC: 3.18 MIL/uL — ABNORMAL LOW (ref 3.87–5.11)
RDW: 25.2 % — ABNORMAL HIGH (ref 11.5–15.5)
WBC: 9.7 10*3/uL (ref 4.0–10.5)
nRBC: 2 % — ABNORMAL HIGH (ref 0.0–0.2)

## 2021-04-03 MED ORDER — HEPARIN SOD (PORK) LOCK FLUSH 100 UNIT/ML IV SOLN
500.0000 [IU] | Freq: Once | INTRAVENOUS | Status: AC | PRN
  Administered 2021-04-03: 500 [IU]
  Filled 2021-04-03: qty 5

## 2021-04-03 MED ORDER — SODIUM CHLORIDE 0.9 % IV SOLN
80.0000 mg/m2 | Freq: Once | INTRAVENOUS | Status: AC
  Administered 2021-04-03: 150 mg via INTRAVENOUS
  Filled 2021-04-03: qty 25

## 2021-04-03 MED ORDER — SODIUM CHLORIDE 0.9% FLUSH
10.0000 mL | INTRAVENOUS | Status: DC | PRN
  Administered 2021-04-03: 10 mL
  Filled 2021-04-03: qty 10

## 2021-04-03 MED ORDER — FAMOTIDINE 20 MG IN NS 100 ML IVPB
INTRAVENOUS | Status: AC
  Filled 2021-04-03: qty 100

## 2021-04-03 MED ORDER — SODIUM CHLORIDE 0.9% FLUSH
10.0000 mL | Freq: Once | INTRAVENOUS | Status: AC
  Administered 2021-04-03: 10 mL
  Filled 2021-04-03: qty 10

## 2021-04-03 MED ORDER — DIPHENHYDRAMINE HCL 50 MG/ML IJ SOLN
INTRAMUSCULAR | Status: AC
  Filled 2021-04-03: qty 1

## 2021-04-03 MED ORDER — DIPHENHYDRAMINE HCL 50 MG/ML IJ SOLN
25.0000 mg | Freq: Once | INTRAMUSCULAR | Status: AC
Start: 2021-04-03 — End: 2021-04-03
  Administered 2021-04-03: 25 mg via INTRAVENOUS

## 2021-04-03 MED ORDER — SODIUM CHLORIDE 0.9 % IV SOLN
Freq: Once | INTRAVENOUS | Status: AC
  Filled 2021-04-03: qty 250

## 2021-04-03 MED ORDER — FAMOTIDINE 20 MG IN NS 100 ML IVPB
20.0000 mg | Freq: Once | INTRAVENOUS | Status: AC
  Administered 2021-04-03: 20 mg via INTRAVENOUS

## 2021-04-03 MED ORDER — SODIUM CHLORIDE 0.9 % IV SOLN
10.0000 mg | Freq: Once | INTRAVENOUS | Status: AC
  Administered 2021-04-03: 10 mg via INTRAVENOUS
  Filled 2021-04-03: qty 10

## 2021-04-03 NOTE — Progress Notes (Signed)
Okay to treat with elevated heart rate per Dr. Lindi Adie

## 2021-04-03 NOTE — Assessment & Plan Note (Signed)
09/20/2019: Screening mammogram on 08/28/20 showed the stable bilateral masses, a new 0.9cm mass at the 1 o'clock position in the left breast, and one abnormal left axillary lymph node with 0.6cm cortical thickening. 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 MammaPrint: High risk: Luminal type B, 5-year metastasis free survival with chemo and hormone therapy: 93%, absolute benefit from chemo greater than 12%, average 10-year risk of recurrence untreated: 29%  Treatment plan: 1.Mastectomywith targeted node dissection 2.adjuvant chemotherapy with dose dense Adriamycin and Cytoxan followed by Taxol 3.Adjuvant radiation 4.Followed by adjuvant antiestrogen therapy ------------------------------------------------------------------------------------------------------------- Bilateral mastectomies with reconstruction [name]):  Right breast: no malignancy  Left breast: invasive and in situ ductal carcinoma, 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 -------------------------------------------------------------------------------------------------- Current Treatment: Completed 4 cycles of dose dense Adriamycin and Cytoxan,  today cycle 2 of Taxol 12/30/20: ECHO EF 65-70% Labs reviewed  Chemo Toxicities: 1.Fatigue 2.Nausea: Improved 3.Anemia: Hemoglobin8.5: On oral iron therapy 4.Constipation: Patient is taking more fiber in her diet and its helped her.  Seroma: She underwent drainage of the seroma on 03/19/2021 by Dr. Marla Roe and she now has a drain in place.  RTC weekly for Taxol and every other week for follow-up with me.

## 2021-04-04 ENCOUNTER — Other Ambulatory Visit: Payer: Self-pay

## 2021-04-04 ENCOUNTER — Telehealth: Payer: Self-pay

## 2021-04-04 ENCOUNTER — Ambulatory Visit (INDEPENDENT_AMBULATORY_CARE_PROVIDER_SITE_OTHER): Payer: 59 | Admitting: Surgical

## 2021-04-04 DIAGNOSIS — S21001A Unspecified open wound of right breast, initial encounter: Secondary | ICD-10-CM

## 2021-04-04 DIAGNOSIS — Z9013 Acquired absence of bilateral breasts and nipples: Secondary | ICD-10-CM

## 2021-04-04 NOTE — Telephone Encounter (Signed)
Patient called to confirm that she is having surgery next week.   RN reviewed with MD - Per MD hold treatment of Taxol next week.  Resume 6/9.   Patient notified.

## 2021-04-04 NOTE — Progress Notes (Cosign Needed)
Patient is a 46 year old female here for follow-up on her bilateral breast reconstruction.  She recently had removal of her right tissue expander with Dr. Marla Roe on 03/19/2021.  She reports that overall she is feeling much better in regards to her overall level of energy.  She does report that she has been using Xeroform on the right breast wound.  She reports minimal output from the right JP drain.  She is not having any infectious symptoms.  Chaperone present on exam On exam right breast incision has some dehiscence that is approximately 4 x 1.5 cm.  It is completely full-thickness.  I am able to see the JP drain tube.  There is no surrounding erythema or cellulitic changes noted.  No purulence is noted.  No foul odors are noted.   Plan for debridement/excision of right breast wound and primary closure on 04/10/2021 with Dr. Marla Roe.  Patient is aware to remain n.p.o. after midnight on 04/09/2021.  Recommend continuing with Xeroform 4 x 4 gauze dressing changes.  Recommend calling with any questions or concerns if she has any changes over the weekend.  Nothing looks infected.

## 2021-04-08 ENCOUNTER — Other Ambulatory Visit: Payer: Self-pay

## 2021-04-08 ENCOUNTER — Encounter (HOSPITAL_BASED_OUTPATIENT_CLINIC_OR_DEPARTMENT_OTHER): Payer: Self-pay | Admitting: Plastic Surgery

## 2021-04-10 ENCOUNTER — Encounter (HOSPITAL_BASED_OUTPATIENT_CLINIC_OR_DEPARTMENT_OTHER): Payer: Self-pay | Admitting: Plastic Surgery

## 2021-04-10 ENCOUNTER — Encounter (HOSPITAL_BASED_OUTPATIENT_CLINIC_OR_DEPARTMENT_OTHER)
Admission: RE | Disposition: A | Discharge status: Home / Self Care | Payer: Self-pay | Source: Home / Self Care | Attending: Plastic Surgery

## 2021-04-10 ENCOUNTER — Ambulatory Visit (HOSPITAL_BASED_OUTPATIENT_CLINIC_OR_DEPARTMENT_OTHER): Payer: 59 | Admitting: Anesthesiology

## 2021-04-10 ENCOUNTER — Inpatient Hospital Stay: Payer: 59 | Admitting: Hematology and Oncology

## 2021-04-10 ENCOUNTER — Other Ambulatory Visit: Payer: Self-pay

## 2021-04-10 ENCOUNTER — Ambulatory Visit (HOSPITAL_BASED_OUTPATIENT_CLINIC_OR_DEPARTMENT_OTHER)
Admission: RE | Admit: 2021-04-10 | Discharge: 2021-04-10 | Disposition: A | Discharge status: Home / Self Care | Payer: 59 | Attending: Plastic Surgery | Admitting: Plastic Surgery

## 2021-04-10 ENCOUNTER — Inpatient Hospital Stay: Payer: 59

## 2021-04-10 DIAGNOSIS — Z791 Long term (current) use of non-steroidal anti-inflammatories (NSAID): Secondary | ICD-10-CM | POA: Diagnosis not present

## 2021-04-10 DIAGNOSIS — Z800 Family history of malignant neoplasm of digestive organs: Secondary | ICD-10-CM | POA: Insufficient documentation

## 2021-04-10 DIAGNOSIS — L089 Local infection of the skin and subcutaneous tissue, unspecified: Secondary | ICD-10-CM | POA: Diagnosis not present

## 2021-04-10 DIAGNOSIS — X58XXXA Exposure to other specified factors, initial encounter: Secondary | ICD-10-CM | POA: Insufficient documentation

## 2021-04-10 DIAGNOSIS — C50412 Malignant neoplasm of upper-outer quadrant of left female breast: Secondary | ICD-10-CM | POA: Diagnosis not present

## 2021-04-10 DIAGNOSIS — T8189XA Other complications of procedures, not elsewhere classified, initial encounter: Secondary | ICD-10-CM | POA: Diagnosis not present

## 2021-04-10 DIAGNOSIS — Z87891 Personal history of nicotine dependence: Secondary | ICD-10-CM | POA: Diagnosis not present

## 2021-04-10 DIAGNOSIS — Z9013 Acquired absence of bilateral breasts and nipples: Secondary | ICD-10-CM | POA: Diagnosis not present

## 2021-04-10 DIAGNOSIS — S21001A Unspecified open wound of right breast, initial encounter: Secondary | ICD-10-CM

## 2021-04-10 DIAGNOSIS — Z801 Family history of malignant neoplasm of trachea, bronchus and lung: Secondary | ICD-10-CM | POA: Diagnosis not present

## 2021-04-10 DIAGNOSIS — Z79899 Other long term (current) drug therapy: Secondary | ICD-10-CM | POA: Insufficient documentation

## 2021-04-10 DIAGNOSIS — Z8249 Family history of ischemic heart disease and other diseases of the circulatory system: Secondary | ICD-10-CM | POA: Diagnosis not present

## 2021-04-10 DIAGNOSIS — Z9221 Personal history of antineoplastic chemotherapy: Secondary | ICD-10-CM | POA: Insufficient documentation

## 2021-04-10 DIAGNOSIS — F4310 Post-traumatic stress disorder, unspecified: Secondary | ICD-10-CM | POA: Diagnosis not present

## 2021-04-10 DIAGNOSIS — Z825 Family history of asthma and other chronic lower respiratory diseases: Secondary | ICD-10-CM | POA: Insufficient documentation

## 2021-04-10 DIAGNOSIS — Z823 Family history of stroke: Secondary | ICD-10-CM | POA: Insufficient documentation

## 2021-04-10 DIAGNOSIS — Z170 Estrogen receptor positive status [ER+]: Secondary | ICD-10-CM | POA: Diagnosis not present

## 2021-04-10 DIAGNOSIS — Z853 Personal history of malignant neoplasm of breast: Secondary | ICD-10-CM | POA: Insufficient documentation

## 2021-04-10 DIAGNOSIS — Z7951 Long term (current) use of inhaled steroids: Secondary | ICD-10-CM | POA: Insufficient documentation

## 2021-04-10 DIAGNOSIS — F418 Other specified anxiety disorders: Secondary | ICD-10-CM | POA: Diagnosis not present

## 2021-04-10 DIAGNOSIS — Z91030 Bee allergy status: Secondary | ICD-10-CM | POA: Diagnosis not present

## 2021-04-10 HISTORY — PX: DEBRIDEMENT AND CLOSURE WOUND: SHX5614

## 2021-04-10 LAB — POCT PREGNANCY, URINE: Preg Test, Ur: NEGATIVE

## 2021-04-10 SURGERY — DEBRIDEMENT, WOUND, WITH CLOSURE
Anesthesia: General | Site: Breast | Laterality: Right

## 2021-04-10 MED ORDER — ONDANSETRON HCL 4 MG/2ML IJ SOLN
INTRAMUSCULAR | Status: DC | PRN
  Administered 2021-04-10: 4 mg via INTRAVENOUS

## 2021-04-10 MED ORDER — MIDAZOLAM HCL 2 MG/2ML IJ SOLN
INTRAMUSCULAR | Status: AC
  Filled 2021-04-10: qty 2

## 2021-04-10 MED ORDER — SUCCINYLCHOLINE CHLORIDE 200 MG/10ML IV SOSY
PREFILLED_SYRINGE | INTRAVENOUS | Status: AC
  Filled 2021-04-10: qty 10

## 2021-04-10 MED ORDER — ONDANSETRON HCL 4 MG/2ML IJ SOLN
INTRAMUSCULAR | Status: AC
  Filled 2021-04-10: qty 2

## 2021-04-10 MED ORDER — ACETAMINOPHEN 160 MG/5ML PO SOLN: 325.0000 mg | ORAL | Status: DC | PRN

## 2021-04-10 MED ORDER — SCOPOLAMINE 1 MG/3DAYS TD PT72
MEDICATED_PATCH | TRANSDERMAL | Status: AC
  Filled 2021-04-10: qty 1

## 2021-04-10 MED ORDER — CHLORHEXIDINE GLUCONATE CLOTH 2 % EX PADS: 6.0000 | MEDICATED_PAD | Freq: Once | CUTANEOUS | Status: DC

## 2021-04-10 MED ORDER — PROPOFOL 10 MG/ML IV BOLUS
INTRAVENOUS | Status: AC
  Filled 2021-04-10: qty 20

## 2021-04-10 MED ORDER — FENTANYL CITRATE (PF) 100 MCG/2ML IJ SOLN: 25.0000 ug | INTRAMUSCULAR | Status: DC | PRN

## 2021-04-10 MED ORDER — SCOPOLAMINE 1 MG/3DAYS TD PT72
1.0000 | MEDICATED_PATCH | TRANSDERMAL | Status: DC
  Administered 2021-04-10: 1.5 mg via TRANSDERMAL

## 2021-04-10 MED ORDER — OXYCODONE HCL 5 MG PO TABS: 5.0000 mg | ORAL_TABLET | Freq: Once | ORAL | Status: DC | PRN

## 2021-04-10 MED ORDER — FENTANYL CITRATE (PF) 100 MCG/2ML IJ SOLN
INTRAMUSCULAR | Status: DC | PRN
  Administered 2021-04-10 (×2): 100 ug via INTRAVENOUS

## 2021-04-10 MED ORDER — CEFAZOLIN SODIUM-DEXTROSE 2-4 GM/100ML-% IV SOLN
INTRAVENOUS | Status: AC
  Filled 2021-04-10: qty 100

## 2021-04-10 MED ORDER — PROMETHAZINE HCL 25 MG/ML IJ SOLN: 6.2500 mg | INTRAMUSCULAR | Status: DC | PRN

## 2021-04-10 MED ORDER — PROPOFOL 10 MG/ML IV BOLUS
INTRAVENOUS | Status: DC | PRN
  Administered 2021-04-10: 300 mg via INTRAVENOUS

## 2021-04-10 MED ORDER — OXYCODONE HCL 5 MG/5ML PO SOLN: 5.0000 mg | Freq: Once | ORAL | Status: DC | PRN

## 2021-04-10 MED ORDER — FENTANYL CITRATE (PF) 100 MCG/2ML IJ SOLN
INTRAMUSCULAR | Status: AC
  Filled 2021-04-10: qty 2

## 2021-04-10 MED ORDER — LACTATED RINGERS IV SOLN: INTRAVENOUS | Status: DC

## 2021-04-10 MED ORDER — SUCCINYLCHOLINE CHLORIDE 200 MG/10ML IV SOSY
PREFILLED_SYRINGE | INTRAVENOUS | Status: DC | PRN
  Administered 2021-04-10: 80 mg via INTRAVENOUS

## 2021-04-10 MED ORDER — CEFAZOLIN SODIUM-DEXTROSE 2-4 GM/100ML-% IV SOLN
2.0000 g | INTRAVENOUS | Status: AC
  Administered 2021-04-10: 2 g via INTRAVENOUS

## 2021-04-10 MED ORDER — ACETAMINOPHEN 325 MG PO TABS: 325.0000 mg | ORAL_TABLET | ORAL | Status: DC | PRN

## 2021-04-10 MED ORDER — MIDAZOLAM HCL 5 MG/5ML IJ SOLN
INTRAMUSCULAR | Status: DC | PRN
  Administered 2021-04-10: 2 mg via INTRAVENOUS

## 2021-04-10 MED ORDER — DEXAMETHASONE SODIUM PHOSPHATE 4 MG/ML IJ SOLN
INTRAMUSCULAR | Status: DC | PRN
  Administered 2021-04-10: 5 mg via INTRAVENOUS

## 2021-04-10 MED ORDER — ACETAMINOPHEN 10 MG/ML IV SOLN: 1000.0000 mg | Freq: Once | INTRAVENOUS | Status: DC | PRN

## 2021-04-10 MED ORDER — BUPIVACAINE-EPINEPHRINE (PF) 0.25% -1:200000 IJ SOLN
INTRAMUSCULAR | Status: AC
  Filled 2021-04-10: qty 30

## 2021-04-10 MED ORDER — AMISULPRIDE (ANTIEMETIC) 5 MG/2ML IV SOLN: 10.0000 mg | Freq: Once | INTRAVENOUS | Status: DC | PRN

## 2021-04-10 MED ORDER — BUPIVACAINE-EPINEPHRINE 0.25% -1:200000 IJ SOLN
INTRAMUSCULAR | Status: DC | PRN
  Administered 2021-04-10: 10 mL

## 2021-04-10 MED ORDER — LIDOCAINE HCL (CARDIAC) PF 100 MG/5ML IV SOSY
PREFILLED_SYRINGE | INTRAVENOUS | Status: DC | PRN
  Administered 2021-04-10: 40 mg via INTRAVENOUS

## 2021-04-10 SURGICAL SUPPLY — 64 items
ADH SKN CLS APL DERMABOND .7 (GAUZE/BANDAGES/DRESSINGS) ×1
BINDER BREAST 3XL (GAUZE/BANDAGES/DRESSINGS) IMPLANT
BINDER BREAST LRG (GAUZE/BANDAGES/DRESSINGS) IMPLANT
BINDER BREAST MEDIUM (GAUZE/BANDAGES/DRESSINGS) IMPLANT
BINDER BREAST XLRG (GAUZE/BANDAGES/DRESSINGS) ×2 IMPLANT
BINDER BREAST XXLRG (GAUZE/BANDAGES/DRESSINGS) IMPLANT
BIOPATCH RED 1 DISK 7.0 (GAUZE/BANDAGES/DRESSINGS) ×2 IMPLANT
BLADE CLIPPER SURG (BLADE) IMPLANT
BLADE HEX COATED 2.75 (ELECTRODE) IMPLANT
BLADE SURG 10 STRL SS (BLADE) IMPLANT
BLADE SURG 15 STRL LF DISP TIS (BLADE) ×1 IMPLANT
BLADE SURG 15 STRL SS (BLADE) ×2
BNDG GAUZE ELAST 4 BULKY (GAUZE/BANDAGES/DRESSINGS) IMPLANT
COVER BACK TABLE 60X90IN (DRAPES) ×2 IMPLANT
COVER MAYO STAND STRL (DRAPES) ×2 IMPLANT
COVER WAND RF STERILE (DRAPES) IMPLANT
DECANTER SPIKE VIAL GLASS SM (MISCELLANEOUS) IMPLANT
DERMABOND ADVANCED (GAUZE/BANDAGES/DRESSINGS) ×1
DERMABOND ADVANCED .7 DNX12 (GAUZE/BANDAGES/DRESSINGS) ×1 IMPLANT
DRAIN CHANNEL 15F RND FF W/TCR (WOUND CARE) ×2 IMPLANT
DRAIN CHANNEL 19F RND (DRAIN) IMPLANT
DRAPE INCISE IOBAN 66X45 STRL (DRAPES) IMPLANT
DRAPE LAPAROSCOPIC ABDOMINAL (DRAPES) IMPLANT
DRAPE LAPAROTOMY 100X72 PEDS (DRAPES) ×2 IMPLANT
DRAPE SURG 17X23 STRL (DRAPES) IMPLANT
DRAPE U-SHAPE 76X120 STRL (DRAPES) IMPLANT
DRSG ADAPTIC 3X8 NADH LF (GAUZE/BANDAGES/DRESSINGS) IMPLANT
DRSG EMULSION OIL 3X3 NADH (GAUZE/BANDAGES/DRESSINGS) IMPLANT
DRSG HYDROCOLLOID 4X4 (GAUZE/BANDAGES/DRESSINGS) IMPLANT
DRSG PAD ABDOMINAL 8X10 ST (GAUZE/BANDAGES/DRESSINGS) ×2 IMPLANT
DRSG TEGADERM 4X10 (GAUZE/BANDAGES/DRESSINGS) IMPLANT
DRSG TELFA 3X8 NADH (GAUZE/BANDAGES/DRESSINGS) ×2 IMPLANT
ELECT BLADE 4.0 EZ CLEAN MEGAD (MISCELLANEOUS) ×2
ELECT REM PT RETURN 9FT ADLT (ELECTROSURGICAL) ×2
ELECTRODE BLDE 4.0 EZ CLN MEGD (MISCELLANEOUS) ×1 IMPLANT
ELECTRODE REM PT RTRN 9FT ADLT (ELECTROSURGICAL) ×1 IMPLANT
EVACUATOR SILICONE 100CC (DRAIN) ×2 IMPLANT
GAUZE SPONGE 4X4 12PLY STRL (GAUZE/BANDAGES/DRESSINGS) IMPLANT
GAUZE XEROFORM 5X9 LF (GAUZE/BANDAGES/DRESSINGS) ×2 IMPLANT
GLOVE SURG ENC MOIS LTX SZ6.5 (GLOVE) ×6 IMPLANT
GOWN STRL REUS W/ TWL LRG LVL3 (GOWN DISPOSABLE) ×2 IMPLANT
GOWN STRL REUS W/TWL LRG LVL3 (GOWN DISPOSABLE) ×4
NEEDLE HYPO 25X1 1.5 SAFETY (NEEDLE) ×2 IMPLANT
NS IRRIG 1000ML POUR BTL (IV SOLUTION) ×2 IMPLANT
PACK BASIN DAY SURGERY FS (CUSTOM PROCEDURE TRAY) ×2 IMPLANT
PENCIL SMOKE EVACUATOR (MISCELLANEOUS) ×2 IMPLANT
PIN SAFETY STERILE (MISCELLANEOUS) ×2 IMPLANT
SHEET MEDIUM DRAPE 40X70 STRL (DRAPES) IMPLANT
SLEEVE SCD COMPRESS KNEE MED (STOCKING) ×2 IMPLANT
SPONGE LAP 18X18 RF (DISPOSABLE) ×2 IMPLANT
STAPLER VISISTAT 35W (STAPLE) IMPLANT
SUT MNCRL AB 3-0 PS2 18 (SUTURE) IMPLANT
SUT MNCRL AB 4-0 PS2 18 (SUTURE) ×4 IMPLANT
SUT MON AB 5-0 PS2 18 (SUTURE) IMPLANT
SUT SILK 3 0 PS 1 (SUTURE) ×2 IMPLANT
SWAB COLLECTION DEVICE MRSA (MISCELLANEOUS) IMPLANT
SWAB CULTURE ESWAB REG 1ML (MISCELLANEOUS) IMPLANT
SYR BULB IRRIG 60ML STRL (SYRINGE) IMPLANT
SYR CONTROL 10ML LL (SYRINGE) ×2 IMPLANT
TOWEL GREEN STERILE FF (TOWEL DISPOSABLE) ×4 IMPLANT
TRAY DSU PREP LF (CUSTOM PROCEDURE TRAY) ×2 IMPLANT
TUBE CONNECTING 20X1/4 (TUBING) ×2 IMPLANT
UNDERPAD 30X36 HEAVY ABSORB (UNDERPADS AND DIAPERS) ×2 IMPLANT
YANKAUER SUCT BULB TIP NO VENT (SUCTIONS) ×2 IMPLANT

## 2021-04-10 NOTE — Anesthesia Preprocedure Evaluation (Addendum)
Anesthesia Evaluation  Patient identified by MRN, date of birth, ID band Patient awake    Reviewed: Allergy & Precautions, NPO status , Patient's Chart, lab work & pertinent test results  Airway Mallampati: I  TM Distance: >3 FB Neck ROM: Full    Dental no notable dental hx.    Pulmonary asthma , former smoker,    Pulmonary exam normal        Cardiovascular negative cardio ROS Normal cardiovascular exam     Neuro/Psych PSYCHIATRIC DISORDERS Anxiety Depression negative neurological ROS     GI/Hepatic Neg liver ROS, GERD  ,  Endo/Other  negative endocrine ROS  Renal/GU negative Renal ROS     Musculoskeletal negative musculoskeletal ROS (+)   Abdominal (+) + obese,   Peds  Hematology negative hematology ROS (+)   Anesthesia Other Findings   Reproductive/Obstetrics                            Anesthesia Physical Anesthesia Plan  ASA: III  Anesthesia Plan: General   Post-op Pain Management:    Induction: Intravenous  PONV Risk Score and Plan: 4 or greater and Ondansetron, Dexamethasone, Midazolam and Scopolamine patch - Pre-op  Airway Management Planned: LMA  Additional Equipment: None  Intra-op Plan:   Post-operative Plan: Extubation in OR  Informed Consent: I have reviewed the patients History and Physical, chart, labs and discussed the procedure including the risks, benefits and alternatives for the proposed anesthesia with the patient or authorized representative who has indicated his/her understanding and acceptance.     Dental advisory given  Plan Discussed with: CRNA  Anesthesia Plan Comments:        Anesthesia Quick Evaluation

## 2021-04-10 NOTE — H&P (Signed)
Jennifer Davis is an 45 y.o. female.   Chief Complaint: Right breast wound HPI: The patient is a 45 year old female here for treatment of her right breast wound.  Due to chemotherapy she has had a difficult time healing the right breast surgery site.  The expander was removed and the area was closed.  She has some opening in the area and presents for reclosure.  Past Medical History:  Diagnosis Date  . Anxiety   . Asthma   . Asthma due to seasonal allergies   . Breast cancer ([place])   . Dyspnea    after chemo - fatiqued  . GERD (gastroesophageal reflux disease)    occasionl  . Palpitations 2017   tachy   . PTSD (post-traumatic stress disorder)     Past Surgical History:  Procedure Laterality Date  . BREAST RECONSTRUCTION WITH PLACEMENT OF TISSUE EXPANDER AND FLEX HD (ACELLULAR HYDRATED DERMIS) Bilateral 12/04/2020   Procedure: IMMEDIATE BILATERAL BREAST RECONSTRUCTION WITH PLACEMENT OF TISSUE EXPANDER AND FLEX HD (ACELLULAR HYDRATED DERMIS);  Surgeon: Wallace Going, DO;  Location: [facility];  Service: Plastics;  Laterality: Bilateral;  . CESAREAN SECTION     x3  . MASTECTOMY WITH AXILLARY LYMPH NODE DISSECTION Left 12/04/2020   Procedure: BILATERAL MASTECTOMIES, RADIOACTIVE SEED GUIDED TARGETED LEFT AXILLARY NODE DISSECTION;  Surgeon: Coralie Keens, MD;  Location: [facility];  Service: General;  Laterality: Left;  . PORTACATH PLACEMENT Left 12/04/2020   Procedure: INSERTION PORT-A-CATH LEFT SUBCLAVIAN;  Surgeon: Coralie Keens, MD;  Location: [facility];  Service: General;  Laterality: Left;  . REMOVAL OF TISSUE EXPANDER AND PLACEMENT OF IMPLANT Right 01/04/2021   Procedure: REMOVAL OF TISSUE EXPANDER AND PLACEMENT OF NEW EXPANDER;  Surgeon: Wallace Going, DO;  Location: [facility];  Service: Plastics;  Laterality: Right;  . TISSUE EXPANDER PLACEMENT Right 03/19/2021   Procedure: Removal of right tissue expander;  Surgeon:  Wallace Going, DO;  Location: [facility];  Service: Plastics;  Laterality: Right;  45 min    Family History  Problem Relation Age of Onset  . Hypertension Mother   . Fibromyalgia Mother   . Colon cancer Mother 82  . Asthma Sister        x2  . Yves Dill Parkinson White syndrome Maternal Aunt 22  . Hypertension Maternal Grandmother   . Hypertension Maternal Grandfather   . Cancer - Lung Maternal Grandfather   . Stroke Maternal Grandfather   . Colon cancer Other        MGM's niece, dx unknown age  . Colon cancer Other        MGM's nephew; dx unknown age  . Breast cancer Neg Hx    Social History:  reports that she quit smoking about 9 years ago. Her smoking use included cigarettes. She has never used smokeless tobacco. She reports previous alcohol use. She reports that she does not use drugs.  Allergies:  Allergies  Allergen Reactions  . Bee Venom Hives    wheezy  . Other Itching    Sometimes activates asthma    Medications Prior to Admission  Medication Sig Dispense Refill  . acetaminophen (TYLENOL) 500 MG tablet TAKE 1 TABLET (500 MG TOTAL) BY MOUTH EVERY 6 (SIX) HOURS AS NEEDED. FOR USE AFTER SURGERY (Patient taking differently: Take 500 mg by mouth every 6 (six) hours as needed for mild pain, fever or headache.) 30 tablet 0  . budesonide-formoterol (SYMBICORT) 160-4.5 MCG/ACT inhaler INHALE 2 PUFFS BY MOUTH  2 TIMES A DAY 10.2 g 5  . busPIRone (BUSPAR) 30 MG tablet TAKE 1 TABLET (30 MG TOTAL) BY MOUTH 2 (TWO) TIMES DAILY. 60 tablet 5  . DULoxetine (CYMBALTA) 60 MG capsule TAKE 2 CAPSULES (120 MG TOTAL) BY MOUTH DAILY. 60 capsule 5  . gabapentin (NEURONTIN) 100 MG capsule Take 1 capsule (100 mg total) by mouth 3 (three) times daily as needed. (Patient taking differently: Take 100 mg by mouth 3 (three) times daily as needed (neuropathy).) 90 capsule 1  . ibuprofen (ADVIL) 600 MG tablet TAKE 1 TABLET (600 MG TOTAL) BY MOUTH EVERY 6 (SIX) HOURS AS NEEDED FOR MILD PAIN OR MODERATE  PAIN. FOR USE AFTER SURGERY 30 tablet 0  . lidocaine-prilocaine (EMLA) cream APPLY TO AFFECTED AREA ONCE (Patient taking differently: Apply 1 application topically as needed (port access).) 30 g 3  . loratadine (CLARITIN) 10 MG tablet Take 10 mg by mouth daily.    . melatonin 5 MG TABS Take 5 mg by mouth at bedtime as needed (sleep).    . montelukast (SINGULAIR) 10 MG tablet Take 10 mg by mouth at bedtime.  5  . Multiple Vitamin (MULTIVITAMIN WITH MINERALS) TABS tablet Take 1 tablet by mouth daily.    . Multiple Vitamins-Minerals (MULTIVITAMIN PO) Take 2 tablets by mouth daily. Skin/hair/nail supplement.    . ondansetron (ZOFRAN-ODT) 4 MG disintegrating tablet TAKE 1 TABLET (4 MG TOTAL) BY MOUTH EVERY 8 (EIGHT) HOURS AS NEEDED FOR NAUSEA OR VOMITING. 20 tablet 0  . PROAIR HFA 108 (90 Base) MCG/ACT inhaler Inhale 2 puffs into the lungs as needed for wheezing or shortness of breath.  0  . cetirizine (ZYRTEC) 10 MG tablet Take 10 mg by mouth daily.    . diazepam (VALIUM) 2 MG tablet TAKE 1 TABLET (2 MG TOTAL) BY MOUTH EVERY 12 (TWELVE) HOURS AS NEEDED FOR UP TO 15 DAYS FOR MUSCLE SPASMS. 30 tablet 0  . EPINEPHrine (EPIPEN 2-PAK) 0.3 mg/0.3 mL IJ SOAJ injection Inject 0.3 mLs (0.3 mg total) into the muscle once as needed for up to 1 dose (for severe allergic reaction). CAll 911 immediately if you have to use this medicine 1 each 1  . HYDROcodone-acetaminophen (NORCO/VICODIN) 5-325 MG tablet TAKE 1 TABLET BY MOUTH EVERY 8 (EIGHT) HOURS AS NEEDED FOR UP TO 5 DAYS FOR SEVERE PAIN. 15 tablet 0  . LORazepam (ATIVAN) 0.5 MG tablet Take 0.5 mg by mouth at bedtime as needed (nausea).    . prochlorperazine (COMPAZINE) 10 MG tablet TAKE 1 TABLET (10 MG TOTAL) BY MOUTH EVERY 6 (SIX) HOURS AS NEEDED (NAUSEA OR VOMITING). 30 tablet 1  . tamoxifen (NOLVADEX) 10 MG tablet TAKE 1 TABLET (10 MG TOTAL) BY MOUTH DAILY. 30 tablet 0    Results for orders placed or performed during the hospital encounter of 04/10/21 (from  the past 48 hour(s))  Pregnancy, urine POC     Status: None   Collection Time: 04/10/21 12:37 PM  Result Value Ref Range   Preg Test, Ur NEGATIVE NEGATIVE    Comment:        THE SENSITIVITY OF THIS METHODOLOGY IS >24 mIU/mL    No results found.  Review of Systems  Constitutional: Negative.   HENT: Negative.   Respiratory: Negative.   Cardiovascular: Negative.   Gastrointestinal: Negative.   Endocrine: Negative.   Genitourinary: Negative.   Musculoskeletal: Negative.   Skin: Positive for wound.  Psychiatric/Behavioral: Negative.     Blood pressure (!) 161/85, pulse (!) 110, temperature 98.1  F (36.7 C), temperature source Oral, resp. rate 18, height 4' 11.5" (1.511 m), weight 99.1 kg, SpO2 99 %. Physical Exam Vitals and nursing note reviewed.  Constitutional:      Appearance: Normal appearance.  HENT:     Head: Normocephalic and atraumatic.  Cardiovascular:     Rate and Rhythm: Normal rate.     Pulses: Normal pulses.  Pulmonary:     Effort: Pulmonary effort is normal.  Abdominal:     General: Abdomen is flat.  Neurological:     General: No focal deficit present.     Mental Status: She is alert.  Psychiatric:        Mood and Affect: Mood normal.        Behavior: Behavior normal.      Assessment/Plan Right breast wound present.  Plan on excision and closure.  Risks and complications were reviewed and the patient wishes to proceed with the surgical management.  Miller, DO 04/10/2021, 1:20 PM

## 2021-04-10 NOTE — Anesthesia Postprocedure Evaluation (Signed)
Anesthesia Post Note  Patient: Jennifer Davis  Procedure(s) Performed: excision of right breast wound with closure (Right Breast)     Patient location during evaluation: PACU Anesthesia Type: General Level of consciousness: awake and alert, oriented and patient cooperative Pain management: pain level controlled Vital Signs Assessment: post-procedure vital signs reviewed and stable Respiratory status: spontaneous breathing, nonlabored ventilation and respiratory function stable Cardiovascular status: blood pressure returned to baseline and stable Postop Assessment: no apparent nausea or vomiting Anesthetic complications: no   No complications documented.  Last Vitals:  Vitals:   04/10/21 1500 04/10/21 1515  BP: (!) 145/83 140/75  Pulse: (!) 113 (!) 113  Resp: 20 19  Temp:    SpO2: 100% 94%    Last Pain:  Vitals:   04/10/21 1615  TempSrc:   PainSc: (P) 0-No pain                 Pervis Hocking

## 2021-04-10 NOTE — Transfer of Care (Signed)
Immediate Anesthesia Transfer of Care Note  Patient: Jennifer Davis  Procedure(s) Performed: excision of right breast wound with closure (Right Breast)  Patient Location: PACU  Anesthesia Type:General  Level of Consciousness: awake  Airway & Oxygen Therapy: Patient Spontanous Breathing and Patient connected to face mask oxygen  Post-op Assessment: Report given to RN and Post -op Vital signs reviewed and stable  Post vital signs: Reviewed and stable  Last Vitals:  Vitals Value Taken Time  BP    Temp    Pulse 112 04/10/21 1452  Resp 22 04/10/21 1452  SpO2 100 % 04/10/21 1452  Vitals shown include unvalidated device data.  Last Pain:  Vitals:   04/10/21 1300  TempSrc: Oral  PainSc: 0-No pain      Patients Stated Pain Goal: 4 (29/79/89 2119)  Complications: No complications documented.

## 2021-04-10 NOTE — Op Note (Signed)
DATE OF OPERATION: 04/10/2021  LOCATION: [facility]  PREOPERATIVE DIAGNOSIS: right breast wound  POSTOPERATIVE DIAGNOSIS: Same  PROCEDURE: Excision of right breast wound 1 x 5 cm  SURGEON: Sharnise Blough [place] Brynlie Daza, DO  EBL: 2 cc  CONDITION: Stable  COMPLICATIONS: None  INDICATION: The patient, Jennifer Davis, is a 45 y.o. female born on [birth year], is here for treatment of a right breast wound. The patient has been getting chemotherapy which contributed to the poor wound healing.  The expander was removed and the area closed. It did not appear to be infected but was very inflamed.  She opened back up again and presents for excision, debridement and closure.  PROCEDURE DETAILS:  The patient was seen prior to surgery and marked.  The IV antibiotics were given. The patient was taken to the operating room and given a general anesthetic. A standard time out was performed and all information was confirmed by those in the room. SCDs were placed.  She was prepped and draped.  The 1 x 5 cm area was marked and a #15 blade used to excise the 1 x 5 cm area of skin and soft tissue.  This was sent to path due to history of breast cancer.  Hemostasis was achieved with electrocautery.  The muscle was attached to the deep side of the soft tissue.  This was released.  The curette was used to debride the chest wall.   The muscle was released from the soft tissue in hopes of keeping it from pulling the skin open.  The deep layers were closed with the 3-0 Monocryl followed by 4-0 Monocryl.  Xeroform was applied.  The patient was allowed to wake up and taken to recovery room in stable condition at the end of the case. The family was notified at the end of the case.

## 2021-04-10 NOTE — Discharge Instructions (Signed)

## 2021-04-10 NOTE — Anesthesia Procedure Notes (Signed)
Procedure Name: LMA Insertion Performed by: Jametta Moorehead, [place], CRNA Pre-anesthesia Checklist: Patient identified, Emergency Drugs available, Suction available and Patient being monitored Patient Re-evaluated:Patient Re-evaluated prior to induction Oxygen Delivery Method: Circle system utilized Preoxygenation: Pre-oxygenation with 100% oxygen Induction Type: IV induction Ventilation: Mask ventilation without difficulty LMA: LMA inserted LMA Size: 4.0 Number of attempts: 1 Airway Equipment and Method: Bite block Placement Confirmation: positive ETCO2 Tube secured with: Tape Dental Injury: Teeth and Oropharynx as per pre-operative assessment        

## 2021-04-11 ENCOUNTER — Other Ambulatory Visit (HOSPITAL_COMMUNITY): Payer: Self-pay

## 2021-04-11 ENCOUNTER — Encounter (HOSPITAL_BASED_OUTPATIENT_CLINIC_OR_DEPARTMENT_OTHER): Payer: Self-pay | Admitting: Plastic Surgery

## 2021-04-11 ENCOUNTER — Ambulatory Visit: Payer: 59 | Admitting: Surgical

## 2021-04-11 MED FILL — Duloxetine HCl Enteric Coated Pellets Cap 60 MG (Base Eq): ORAL | 30 days supply | Qty: 60 | Fill #1 | Status: AC

## 2021-04-14 LAB — SURGICAL PATHOLOGY

## 2021-04-16 ENCOUNTER — Telehealth: Payer: 59 | Admitting: Nurse Practitioner

## 2021-04-16 ENCOUNTER — Other Ambulatory Visit (HOSPITAL_COMMUNITY): Payer: Self-pay

## 2021-04-16 DIAGNOSIS — J00 Acute nasopharyngitis [common cold]: Secondary | ICD-10-CM

## 2021-04-16 MED ORDER — AMOXICILLIN-POT CLAVULANATE 875-125 MG PO TABS
1.0000 | ORAL_TABLET | Freq: Two times a day (BID) | ORAL | 0 refills | Status: DC
  Filled 2021-04-16: qty 14, 7d supply, fill #0

## 2021-04-16 MED ORDER — FLUTICASONE PROPIONATE 50 MCG/ACT NA SUSP
2.0000 | Freq: Every day | NASAL | 6 refills | Status: DC
  Filled 2021-04-16: qty 16, 30d supply, fill #0

## 2021-04-16 NOTE — Assessment & Plan Note (Signed)
09/20/2019: Screening mammogram on 08/28/20 showed the stable bilateral masses, a new 0.9cm mass at the 1 o'clock position in the left breast, and one abnormal left axillary lymph node with 0.6cm cortical thickening. 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 MammaPrint: High risk: Luminal type B, 5-year metastasis free survival with chemo and hormone therapy: 93%, absolute benefit from chemo greater than 12%, average 10-year risk of recurrence untreated: 29%  Treatment plan: 1.Mastectomywith targeted node dissection 2.adjuvant chemotherapy with dose dense Adriamycin and Cytoxan followed by Taxol 3.Adjuvant radiation 4.Followed by adjuvant antiestrogen therapy ------------------------------------------------------------------------------------------------------------- Bilateral mastectomies with reconstruction [name]):  Right breast: no malignancy  Left breast: invasive and in situ ductal carcinoma, 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 -------------------------------------------------------------------------------------------------- Current Treatment:Completed 4 cycles of dose denseAdriamycin and Cytoxan, today cycle 4 of Taxol 12/30/20: ECHO EF 65-70% Labs reviewed  Chemo Toxicities: 1.Fatigue 2.Nausea: Improved 3.Anemia: Hemoglobin8.5: On oral iron therapy 4.Constipation: Patient is taking more fiber in her diet and its helped her.  Seroma: She underwent drainage of the seroma on 03/19/2021 by Dr.Dillingham and she now has a drain in place.  She has an appointment to see plastic surgery tomorrow.  She tells me that she is leaking from the incision.  RTC weekly for Taxol and every other week for follow-up with me.

## 2021-04-16 NOTE — Progress Notes (Signed)
We are sorry you are not feeling well.  Here is how we plan to help!  Based on what you have shared with me, it looks like you may have a viral upper respiratory infection.  Upper respiratory infections are caused by a large number of viruses; however, rhinovirus is the most common cause.   Symptoms vary from person to person, with common symptoms including sore throat, cough, fatigue or lack of energy and feeling of general discomfort.  A low-grade fever of up to 100.4 may present, but is often uncommon.  Symptoms vary however, and are closely related to a person's age or underlying illnesses.  The most common symptoms associated with an upper respiratory infection are nasal discharge or congestion, cough, sneezing, headache and pressure in the ears and face.  These symptoms usually persist for about 3 to 10 days, but can last up to 2 weeks.  It is important to know that upper respiratory infections do not cause serious illness or complications in most cases.    Upper respiratory infections can be transmitted from person to person, with the most common method of transmission being a person's hands.  The virus is able to live on the skin and can infect other persons for up to 2 hours after direct contact.  Also, these can be transmitted when someone coughs or sneezes; thus, it is important to cover the mouth to reduce this risk.  To keep the spread of the illness at [place], good hand hygiene is very important.  This is an infection that is most likely caused by a virus. There are no specific treatments other than to help you with the symptoms until the infection runs its course.  We are sorry you are not feeling well.  Here is how we plan to help!   For nasal congestion, you may use an oral decongestants such as Mucinex D or if you have glaucoma or high blood pressure use plain Mucinex.  Saline nasal spray or nasal drops can help and can safely be used as often as needed for congestion.  For your congestion,  I have prescribed Fluticasone nasal spray one spray in each nostril twice a day  If you do not have a history of heart disease, hypertension, diabetes or thyroid disease, prostate/bladder issues or glaucoma, you may also use Sudafed to treat nasal congestion.  It is highly recommended that you consult with a pharmacist or your primary care physician to ensure this medication is safe for you to take.     If you have a cough, you may use cough suppressants such as Delsym and Robitussin.  If you have glaucoma or high blood pressure, you can also use Coricidin HBP.   Due to current breat cancer treatment I am sending in AUgemntin 875mg  1 2x a day for 7days to prevent this from going down into your chest.   If you have a sore or scratchy throat, use a saltwater gargle-  to  teaspoon of salt dissolved in a 4-ounce to 8-ounce glass of warm water.  Gargle the solution for approximately 15-30 seconds and then spit.  It is important not to swallow the solution.  You can also use throat lozenges/cough drops and Chloraseptic spray to help with throat pain or discomfort.  Warm or cold liquids can also be helpful in relieving throat pain.  For headache, pain or general discomfort, you can use Ibuprofen or Tylenol as directed.   Some authorities believe that zinc sprays or the use of  Echinacea may shorten the course of your symptoms.   HOME CARE . Only take medications as instructed by your medical team. . Be sure to drink plenty of fluids. Water is fine as well as fruit juices, sodas and electrolyte beverages. You may want to stay away from caffeine or alcohol. If you are nauseated, try taking small sips of liquids. How do you know if you are getting enough fluid? Your urine should be a pale yellow or almost colorless. . Get rest. . Taking a steamy shower or using a humidifier may help nasal congestion and ease sore throat pain. You can place a towel over your head and breathe in the steam from hot water coming  from a faucet. . Using a saline nasal spray works much the same way. . Cough drops, hard candies and sore throat lozenges may ease your cough. . Avoid close contacts especially the very young and the elderly . Cover your mouth if you cough or sneeze . Always remember to wash your hands.   GET HELP RIGHT AWAY IF: . You develop worsening fever. . If your symptoms do not improve within 10 days . You develop yellow or green discharge from your nose over 3 days. . You have coughing fits . You develop a severe head ache or visual changes. . You develop shortness of breath, difficulty breathing or start having chest pain . Your symptoms persist after you have completed your treatment plan  MAKE SURE YOU   Understand these instructions.  Will watch your condition.  Will get help right away if you are not doing well or get worse.  Your e-visit answers were reviewed by a board certified advanced clinical practitioner to complete your personal care plan. Depending upon the condition, your plan could have included both over the counter or prescription medications. Please review your pharmacy choice. If there is a problem, you may call our nursing hot line at and have the prescription routed to another pharmacy. Your safety is important to [place]. If you have drug allergies check your prescription carefully.   You can use MyChart to ask questions about today's visit, request a non-urgent call back, or ask for a work or school excuse for 24 hours related to this e-Visit. If it has been greater than 24 hours you will need to follow up with your provider, or enter a new e-Visit to address those concerns. You will get an e-mail in the next two days asking about your experience.  I hope that your e-visit has been valuable and will speed your recovery. Thank you for using e-visits.   5-10 minutes spent reviewing and documenting in chart.

## 2021-04-16 NOTE — Progress Notes (Signed)
Patient Care Team: Wenda Low, MD as PCP - General (Internal Medicine) Rockwell Germany, RN as Oncology Nurse Navigator Mauro Kaufmann, RN as Oncology Nurse Navigator Coralie Keens, MD as Consulting Physician (General Surgery) Nicholas Lose, MD as Consulting Physician (Hematology and Oncology) Gery Pray, MD as Consulting Physician (Radiation Oncology) Gwyndolyn Kaufman, RN (Inactive) as Registered Nurse  DIAGNOSIS:    ICD-10-CM   1. Malignant neoplasm of upper-outer quadrant of left breast in female, estrogen receptor positive [name]  C50.412    Z17.0       SUMMARY OF ONCOLOGIC HISTORY: Oncology History  Malignant neoplasm of upper-outer quadrant of left breast in female, estrogen receptor positive ([name])  09/19/2020 Initial Diagnosis   Mammogram in 07/2018 showed probably benign bilateral breast masses that were stable on her last mammogram on 07/26/19. Mammogram on 08/28/20 showed the stable bilateral masses, a new 0.9cm mass at the 1 o'clock position in the left breast, and one abnormal left axillary lymph node with 0.6cm cortical thickening. 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    10/02/2020 Genetic Testing   Negative genetic testing: no pathogenic variants detected in [facility] Common-Hereditary Cancers Panel.  Variants of uncertain significance detected in APC c.6918T>A (p.Asp2306Glu) and POLE  c.2612G>C (p.Ser871Thr).  The report date is November 24. 2021.   The Common Hereditary Cancers Panel offered by [facility] includes sequencing and/or deletion duplication testing of the following 48 genes: APC, ATM, AXIN2, BARD1, BMPR1A, BRCA1, BRCA2, BRIP1, CDH1, CDK4, CDKN2A (p14ARF), CDKN2A (p16INK4a), CHEK2, CTNNA1, DICER1, EPCAM (Deletion/duplication testing only), GREM1 (promoter region deletion/duplication testing only), KIT, MEN1, MLH1, MSH2, MSH3, MSH6, MUTYH, NBN, NF1,  NHTL1, PALB2, PDGFRA, PMS2, POLD1, POLE, PTEN, RAD50, RAD51C, RAD51D, RNF43, SDHB, SDHC, SDHD, SMAD4, SMARCA4. STK11, TP53, TSC1, TSC2, and VHL.  The following genes were evaluated for sequence changes only: SDHA and HOXB13 c.251G>A variant only.   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    12/04/2020 Cancer Staging   Staging form: Breast, AJCC 8th Edition - Pathologic stage from 12/04/2020: Stage IA (pT1c, pN1a, cM0, G2, ER+, PR+, HER2-) - Signed by [name], NP on 12/18/2020  Histologic grading system: 3 grade system    01/22/2021 -  Chemotherapy    Patient is on Treatment Plan: BREAST ADJUVANT DOSE DENSE AC Q14D / PACLITAXEL Q7D         CHIEF COMPLIANT: Cycle 3 Taxol  INTERVAL HISTORY: Jennifer Davis is a 45 y.o. with above-mentioned history of left breast cancer who underwent bilateral mastectomies with reconstruction and is currently on adjuvant chemotherapy with weekly Taxol after completing 4 cycles of Adriamycin and Cytoxan. She presents to the clinic today for a toxicity check and cycle 3.  ALLERGIES:  is allergic to bee venom and other.  MEDICATIONS:  Current Outpatient Medications  Medication Sig Dispense Refill   acetaminophen (TYLENOL) 500 MG tablet TAKE 1 TABLET (500 MG TOTAL) BY MOUTH EVERY 6 (SIX) HOURS AS NEEDED. FOR USE AFTER SURGERY (Patient taking differently: Take 500 mg by mouth every 6 (six) hours as needed for mild pain, fever or headache.) 30 tablet 0   amoxicillin-clavulanate (AUGMENTIN) 875-125 MG tablet Take 1 tablet by mouth 2 (two) times daily. 14 tablet 0  budesonide-formoterol (SYMBICORT) 160-4.5 MCG/ACT inhaler INHALE 2 PUFFS BY MOUTH 2 TIMES A DAY 10.2 g 5   busPIRone (BUSPAR) 30 MG tablet TAKE 1 TABLET (30 MG TOTAL) BY MOUTH 2 (TWO) TIMES  DAILY. 60 tablet 5   cetirizine (ZYRTEC) 10 MG tablet Take 10 mg by mouth daily.     diazepam (VALIUM) 2 MG tablet TAKE 1 TABLET (2 MG TOTAL) BY MOUTH EVERY 12 (TWELVE) HOURS AS NEEDED FOR UP TO 15 DAYS FOR MUSCLE SPASMS. 30 tablet 0   DULoxetine (CYMBALTA) 60 MG capsule TAKE 2 CAPSULES (120 MG TOTAL) BY MOUTH DAILY. 60 capsule 5   EPINEPHrine (EPIPEN 2-PAK) 0.3 mg/0.3 mL IJ SOAJ injection Inject 0.3 mLs (0.3 mg total) into the muscle once as needed for up to 1 dose (for severe allergic reaction). CAll 911 immediately if you have to use this medicine 1 each 1   fluticasone (FLONASE) 50 MCG/ACT nasal spray Place 2 sprays into both nostrils daily. 16 g 6   gabapentin (NEURONTIN) 100 MG capsule Take 1 capsule (100 mg total) by mouth 3 (three) times daily as needed. (Patient taking differently: Take 100 mg by mouth 3 (three) times daily as needed (neuropathy).) 90 capsule 1   HYDROcodone-acetaminophen (NORCO/VICODIN) 5-325 MG tablet TAKE 1 TABLET BY MOUTH EVERY 8 (EIGHT) HOURS AS NEEDED FOR UP TO 5 DAYS FOR SEVERE PAIN. 15 tablet 0   ibuprofen (ADVIL) 600 MG tablet TAKE 1 TABLET (600 MG TOTAL) BY MOUTH EVERY 6 (SIX) HOURS AS NEEDED FOR MILD PAIN OR MODERATE PAIN. FOR USE AFTER SURGERY 30 tablet 0   lidocaine-prilocaine (EMLA) cream APPLY TO AFFECTED AREA ONCE (Patient taking differently: Apply 1 application topically as needed (port access).) 30 g 3   loratadine (CLARITIN) 10 MG tablet Take 10 mg by mouth daily.     LORazepam (ATIVAN) 0.5 MG tablet Take 0.5 mg by mouth at bedtime as needed (nausea).     melatonin 5 MG TABS Take 5 mg by mouth at bedtime as needed (sleep).     montelukast (SINGULAIR) 10 MG tablet Take 10 mg by mouth at bedtime.  5   Multiple Vitamin (MULTIVITAMIN WITH MINERALS) TABS tablet Take 1 tablet by mouth daily.     Multiple Vitamins-Minerals (MULTIVITAMIN PO) Take 2 tablets by mouth daily. Skin/hair/nail supplement.     ondansetron (ZOFRAN-ODT) 4 MG disintegrating tablet TAKE 1  TABLET (4 MG TOTAL) BY MOUTH EVERY 8 (EIGHT) HOURS AS NEEDED FOR NAUSEA OR VOMITING. 20 tablet 0   PROAIR HFA 108 (90 Base) MCG/ACT inhaler Inhale 2 puffs into the lungs as needed for wheezing or shortness of breath.  0   prochlorperazine (COMPAZINE) 10 MG tablet TAKE 1 TABLET (10 MG TOTAL) BY MOUTH EVERY 6 (SIX) HOURS AS NEEDED (NAUSEA OR VOMITING). 30 tablet 1   tamoxifen (NOLVADEX) 10 MG tablet TAKE 1 TABLET (10 MG TOTAL) BY MOUTH DAILY. 30 tablet 0   No current facility-administered medications for this visit.    PHYSICAL EXAMINATION: ECOG PERFORMANCE STATUS: 1 - Symptomatic but completely ambulatory  There were no vitals filed for this visit. There were no vitals filed for this visit.  LABORATORY DATA:  I have reviewed the data as listed CMP Latest Ref Rng & Units 04/03/2021 03/25/2021 03/05/2021  Glucose 70 - 99 mg/dL 133(H) 126(H) 139(H)  BUN 6 - 20 mg/dL '11 7 7  ' Creatinine 0.44 - 1.00 mg/dL 0.74 0.77 0.78  Sodium 135 - 145 mmol/L 140 139 141  Potassium 3.5 - 5.1 mmol/L 3.6  3.4(L) 3.6  Chloride 98 - 111 mmol/L 104 105 107  CO2 22 - 32 mmol/L '27 23 23  ' Calcium 8.9 - 10.3 mg/dL 9.0 9.0 8.7(L)  Total Protein 6.5 - 8.1 g/dL 6.7 6.5 6.6  Total Bilirubin 0.3 - 1.2 mg/dL 0.3 <0.2(L) <0.2(L)  Alkaline Phos 38 - 126 U/L 93 121 109  AST 15 - 41 U/L 48(H) 32 19  ALT 0 - 44 U/L 70(H) 56(H) 24    Lab Results  Component Value Date   WBC 6.8 04/17/2021   HGB 8.8 (L) 04/17/2021   HCT 28.9 (L) 04/17/2021   MCV 86.0 04/17/2021   PLT 326 04/17/2021   NEUTROABS 4.4 04/17/2021    ASSESSMENT & PLAN:  Malignant neoplasm of upper-outer quadrant of left breast in female, estrogen receptor positive ([place]) 09/20/2019: Screening mammogram on 08/28/20 showed the stable bilateral masses, a new 0.9cm mass at the 1 o'clock position in the left breast, and one abnormal left axillary lymph node with 0.6cm cortical thickening. Biopsy showed invasive and in situ ductal carcinoma in the breast and axilla,  grade 2, HER-2 equivocal by IHC (2+), ER+ 90%, PR+ 60%, Ki67 25%.  MammaPrint: High risk: Luminal type B, 5-year metastasis free survival with chemo and hormone therapy: 93%, absolute benefit from chemo greater than 12%, average 10-year risk of recurrence untreated: 29%   Treatment plan: 1.  Mastectomy with targeted node dissection 2.  adjuvant chemotherapy with dose dense Adriamycin and Cytoxan followed by Taxol 3.  Adjuvant radiation 4.  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  -------------------------------------------------------------------------------------------------- Current Treatment: Completed 4 cycles of dose dense Adriamycin and Cytoxan,  today cycle 4 of Taxol 12/30/20: ECHO EF 65-70% Labs reviewed   Chemo Toxicities: 1.  Fatigue: Improvement.  She thinks this is a walk in the park compared to previous chemo. 2. Nausea: Improved 3.  Anemia: Hemoglobin 8.8: On oral iron therapy 4.  Constipation: Patient is taking more fiber in her diet and its helped her.   Seroma: She underwent drainage of the seroma on 03/19/2021 by Dr. Marla Roe   RTC weekly for Taxol and every other week for follow-up with me.      No orders of the defined types were placed in this encounter.  The patient has a good understanding of the overall plan. she agrees with it. she will call with any problems that may develop before the next visit here.  Total time spent: 30 mins including face to face time and time spent for planning, charting and coordination of care  Rulon Eisenmenger, MD, MPH 04/17/2021  I, Cloyde Reams Dorshimer, am acting as scribe for Dr. Nicholas Lose.  I have  reviewed the above documentation for accuracy and completeness, and I agree with the above.

## 2021-04-17 ENCOUNTER — Inpatient Hospital Stay: Payer: 59

## 2021-04-17 ENCOUNTER — Other Ambulatory Visit: Payer: Self-pay

## 2021-04-17 ENCOUNTER — Inpatient Hospital Stay (HOSPITAL_BASED_OUTPATIENT_CLINIC_OR_DEPARTMENT_OTHER): Payer: 59 | Admitting: Hematology and Oncology

## 2021-04-17 ENCOUNTER — Inpatient Hospital Stay: Payer: 59 | Attending: Hematology and Oncology

## 2021-04-17 ENCOUNTER — Encounter: Payer: Self-pay | Admitting: *Deleted

## 2021-04-17 VITALS — HR 108

## 2021-04-17 DIAGNOSIS — Z5111 Encounter for antineoplastic chemotherapy: Secondary | ICD-10-CM | POA: Diagnosis not present

## 2021-04-17 DIAGNOSIS — C50412 Malignant neoplasm of upper-outer quadrant of left female breast: Secondary | ICD-10-CM | POA: Diagnosis not present

## 2021-04-17 DIAGNOSIS — D6481 Anemia due to antineoplastic chemotherapy: Secondary | ICD-10-CM | POA: Diagnosis not present

## 2021-04-17 DIAGNOSIS — Z170 Estrogen receptor positive status [ER+]: Secondary | ICD-10-CM

## 2021-04-17 DIAGNOSIS — C773 Secondary and unspecified malignant neoplasm of axilla and upper limb lymph nodes: Secondary | ICD-10-CM | POA: Diagnosis not present

## 2021-04-17 DIAGNOSIS — Z95828 Presence of other vascular implants and grafts: Secondary | ICD-10-CM

## 2021-04-17 LAB — CBC WITH DIFFERENTIAL/PLATELET
Abs Immature Granulocytes: 0.06 10*3/uL (ref 0.00–0.07)
Basophils Absolute: 0 10*3/uL (ref 0.0–0.1)
Basophils Relative: 0 %
Eosinophils Absolute: 0.5 10*3/uL (ref 0.0–0.5)
Eosinophils Relative: 8 %
HCT: 28.9 % — ABNORMAL LOW (ref 36.0–46.0)
Hemoglobin: 8.8 g/dL — ABNORMAL LOW (ref 12.0–15.0)
Immature Granulocytes: 1 %
Lymphocytes Relative: 13 %
Lymphs Abs: 0.9 10*3/uL (ref 0.7–4.0)
MCH: 26.2 pg (ref 26.0–34.0)
MCHC: 30.4 g/dL (ref 30.0–36.0)
MCV: 86 fL (ref 80.0–100.0)
Monocytes Absolute: 0.9 10*3/uL (ref 0.1–1.0)
Monocytes Relative: 13 %
Neutro Abs: 4.4 10*3/uL (ref 1.7–7.7)
Neutrophils Relative %: 65 %
Platelets: 326 10*3/uL (ref 150–400)
RBC: 3.36 MIL/uL — ABNORMAL LOW (ref 3.87–5.11)
RDW: 23.7 % — ABNORMAL HIGH (ref 11.5–15.5)
WBC: 6.8 10*3/uL (ref 4.0–10.5)
nRBC: 0.4 % — ABNORMAL HIGH (ref 0.0–0.2)

## 2021-04-17 LAB — COMPREHENSIVE METABOLIC PANEL
ALT: 56 U/L — ABNORMAL HIGH (ref 0–44)
AST: 40 U/L (ref 15–41)
Albumin: 3.5 g/dL (ref 3.5–5.0)
Alkaline Phosphatase: 95 U/L (ref 38–126)
Anion gap: 10 (ref 5–15)
BUN: 9 mg/dL (ref 6–20)
CO2: 24 mmol/L (ref 22–32)
Calcium: 9.2 mg/dL (ref 8.9–10.3)
Chloride: 106 mmol/L (ref 98–111)
Creatinine, Ser: 0.72 mg/dL (ref 0.44–1.00)
GFR, Estimated: >60 mL/min (ref >60)
Glucose, Bld: 123 mg/dL — ABNORMAL HIGH (ref 70–99)
Potassium: 4.2 mmol/L (ref 3.5–5.1)
Sodium: 140 mmol/L (ref 135–145)
Total Bilirubin: 0.3 mg/dL (ref 0.3–1.2)
Total Protein: 7.1 g/dL (ref 6.5–8.1)

## 2021-04-17 MED ORDER — DIPHENHYDRAMINE HCL 50 MG/ML IJ SOLN
25.0000 mg | Freq: Once | INTRAMUSCULAR | Status: AC
  Administered 2021-04-17: 25 mg via INTRAVENOUS

## 2021-04-17 MED ORDER — DIPHENHYDRAMINE HCL 50 MG/ML IJ SOLN
INTRAMUSCULAR | Status: AC
  Filled 2021-04-17: qty 1

## 2021-04-17 MED ORDER — SODIUM CHLORIDE 0.9% FLUSH
10.0000 mL | INTRAVENOUS | Status: DC | PRN
  Administered 2021-04-17: 10 mL
  Filled 2021-04-17: qty 10

## 2021-04-17 MED ORDER — SODIUM CHLORIDE 0.9% FLUSH
10.0000 mL | Freq: Once | INTRAVENOUS | Status: AC
  Administered 2021-04-17: 10 mL
  Filled 2021-04-17: qty 10

## 2021-04-17 MED ORDER — SODIUM CHLORIDE 0.9 % IV SOLN
10.0000 mg | Freq: Once | INTRAVENOUS | Status: AC
  Administered 2021-04-17: 10 mg via INTRAVENOUS
  Filled 2021-04-17: qty 10

## 2021-04-17 MED ORDER — SODIUM CHLORIDE 0.9 % IV SOLN
80.0000 mg/m2 | Freq: Once | INTRAVENOUS | Status: AC
  Administered 2021-04-17: 150 mg via INTRAVENOUS
  Filled 2021-04-17: qty 25

## 2021-04-17 MED ORDER — FAMOTIDINE 20 MG IN NS 100 ML IVPB
INTRAVENOUS | Status: AC
  Filled 2021-04-17: qty 100

## 2021-04-17 MED ORDER — SODIUM CHLORIDE 0.9 % IV SOLN
Freq: Once | INTRAVENOUS | Status: AC
  Filled 2021-04-17: qty 250

## 2021-04-17 MED ORDER — HEPARIN SOD (PORK) LOCK FLUSH 100 UNIT/ML IV SOLN
500.0000 [IU] | Freq: Once | INTRAVENOUS | Status: AC | PRN
  Administered 2021-04-17: 500 [IU]
  Filled 2021-04-17: qty 5

## 2021-04-17 MED ORDER — FAMOTIDINE 20 MG IN NS 100 ML IVPB
20.0000 mg | Freq: Once | INTRAVENOUS | Status: AC
  Administered 2021-04-17: 20 mg via INTRAVENOUS

## 2021-04-17 NOTE — Progress Notes (Signed)
[id number] - TREATMENT OF REFRACTORY NAUSEA ;  Patient missed answering question #1 on the FACT-G Cycle 2 survey online.  Spoke with patient today and reviewed the survey question. Reminded patient of time point after cycle 2 of chemotherapy.  Patient answered "a little bit" to the question of having lack of energy.  Thanked patient for her time today. This answer was entered into the [facility] to complete the survey per the study's request.  Foye Spurling, BSN, RN Clinical Research Nurse 04/17/2021 1:53 PM

## 2021-04-17 NOTE — Patient Instructions (Signed)
[facility]   Discharge Instructions: Thank you for choosing [facility] to provide your oncology and hematology care.   If you have a lab appointment with the Cancer Center, please go directly to the Cancer Center and check in at the registration area.   Wear comfortable clothing and clothing appropriate for easy access to any Portacath or PICC line.   We strive to give you quality time with your provider. You may need to reschedule your appointment if you arrive late (15 or more minutes).  Arriving late affects you and other patients whose appointments are after yours.  Also, if you miss three or more appointments without notifying the office, you may be dismissed from the clinic at the provider's discretion.      For prescription refill requests, have your pharmacy contact our office and allow 72 hours for refills to be completed.    Today you received the following chemotherapy and/or immunotherapy agents: paclitaxel.      To help prevent nausea and vomiting after your treatment, we encourage you to take your nausea medication as directed.  BELOW ARE SYMPTOMS THAT SHOULD BE REPORTED IMMEDIATELY: *FEVER GREATER THAN 100.4 F (38 C) OR HIGHER *CHILLS OR SWEATING *NAUSEA AND VOMITING THAT IS NOT CONTROLLED WITH YOUR NAUSEA MEDICATION *UNUSUAL SHORTNESS OF BREATH *UNUSUAL BRUISING OR BLEEDING *URINARY PROBLEMS (pain or burning when urinating, or frequent urination) *BOWEL PROBLEMS (unusual diarrhea, constipation, pain near the anus) TENDERNESS IN MOUTH AND THROAT WITH OR WITHOUT PRESENCE OF ULCERS (sore throat, sores in mouth, or a toothache) UNUSUAL RASH, SWELLING OR PAIN  UNUSUAL VAGINAL DISCHARGE OR ITCHING   Items with * indicate a potential emergency and should be followed up as soon as possible or go to the Emergency Department if any problems should occur.  Please show the CHEMOTHERAPY ALERT CARD or IMMUNOTHERAPY ALERT CARD at check-in  to the Emergency Department and triage nurse.  Should you have questions after your visit or need to cancel or reschedule your appointment, please contact [facility]  Dept: [phone]  and follow the prompts.  Office hours are 8:00 a.m. to 4:30 p.m. Monday - Friday. Please note that voicemails left after 4:00 p.m. may not be returned until the following business day.  We are closed weekends and major holidays. You have access to a nurse at all times for urgent questions. Please call the main number to the clinic Dept: [phone] and follow the prompts.   For any non-urgent questions, you may also contact your provider using MyChart. We now offer e-Visits for anyone 18 and older to request care online for non-urgent symptoms. For details visit [email or web address].   Also download the MyChart app! Go to the app store, search "MyChart", open the app, select [facility], and log in with your MyChart username and password.  Due to Covid, a mask is required upon entering the hospital/clinic. If you do not have a mask, one will be given to you upon arrival. For doctor visits, patients may have 1 support person aged 18 or older with them. For treatment visits, patients cannot have anyone with them due to current Covid guidelines and our immunocompromised population.   

## 2021-04-17 NOTE — Progress Notes (Signed)
Okay to proceed with treatment with HR of 108 per MD.

## 2021-04-21 ENCOUNTER — Telehealth: Payer: Self-pay | Admitting: Hematology and Oncology

## 2021-04-21 NOTE — Telephone Encounter (Signed)
Scheduled per 6/9 los. Pt will receive an updated appt calendar per next visit appt notes

## 2021-04-22 ENCOUNTER — Other Ambulatory Visit: Payer: Self-pay

## 2021-04-22 ENCOUNTER — Ambulatory Visit (INDEPENDENT_AMBULATORY_CARE_PROVIDER_SITE_OTHER): Payer: 59 | Admitting: Surgical

## 2021-04-22 DIAGNOSIS — S21001A Unspecified open wound of right breast, initial encounter: Secondary | ICD-10-CM

## 2021-04-22 DIAGNOSIS — Z9013 Acquired absence of bilateral breasts and nipples: Secondary | ICD-10-CM

## 2021-04-22 NOTE — Progress Notes (Signed)
Patient is a 45 year old female here for follow-up on her bilateral breast reconstruction.  She most recently underwent excision of right breast wound and closure on 04/10/2021.  She reports she is feeling a lot better after removal of her expander and closure of her right breast wound.  She is here with her family.  She has some questions about restrictions.  She is not having any infectious symptoms.  JP drain output has been approximately 20 cc per 24 hours over the past few days.  The drainage is mostly yellowish clear   Chaperone present on exam On exam left and right breast incisions are intact.  Left breast tissue expander is in place with no subcutaneous fluid collection noted.  No subcutaneous fluid collection noted of right breast.  No erythema noted of right breast.  Nontender to palpation.  Right JP drain in place without any surrounding erythema or cellulitic changes noted.  No wounds noted.  Recommend continue to wear compressive garment to avoid seroma.  There is no sign of infection, hematoma.  Recommend following up in 2 weeks for reevaluation to further discuss ongoing reconstructive options with Dr. Marla Davis.  She currently has 350/535 cc in the left breast expander.  Recommend calling with questions or concerns.

## 2021-04-23 NOTE — Assessment & Plan Note (Signed)
09/20/2019: Screening mammogram on 08/28/20 showed the stable bilateral masses, a new 0.9cm mass at the 1 o'clock position in the left breast, and one abnormal left axillary lymph node with 0.6cm cortical thickening. 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 MammaPrint: High risk: Luminal type B, 5-year metastasis free survival with chemo and hormone therapy: 93%, absolute benefit from chemo greater than 12%, average 10-year risk of recurrence untreated: 29%  Treatment plan: 1.Mastectomywith targeted node dissection 2.adjuvant chemotherapy with dose dense Adriamycin and Cytoxan followed by Taxol 3.Adjuvant radiation 4.Followed by adjuvant antiestrogen therapy ------------------------------------------------------------------------------------------------------------- Bilateral mastectomies with reconstruction [name]): Right breast: no malignancy Left breast: invasive and in situ ductal carcinoma, 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 -------------------------------------------------------------------------------------------------- Current Treatment:Completed 4 cycles of dose denseAdriamycin and Cytoxan,today cycle 4 ofTaxol 12/30/20: ECHO EF 65-70% Labs reviewed  Chemo Toxicities: 1.Fatigue: Improvement.  She thinks this is a walk in the park compared to previous chemo. 2.Nausea: Improved 3.Anemia: Hemoglobin8.8: On oral iron therapy 4.Constipation: Patient is taking more fiber in her diet and its helped her.  Seroma: She underwent drainage of the seroma on 03/19/2021 by Dr.Dillingham RTCweekly for Taxol and every other week for follow-up with me.

## 2021-04-23 NOTE — Progress Notes (Signed)
Error

## 2021-04-23 NOTE — Progress Notes (Signed)
Patient Care Team: Wenda Low, MD as PCP - General (Internal Medicine) Rockwell Germany, RN as Oncology Nurse Navigator Mauro Kaufmann, RN as Oncology Nurse Navigator Coralie Keens, MD as Consulting Physician (General Surgery) Nicholas Lose, MD as Consulting Physician (Hematology and Oncology) Gery Pray, MD as Consulting Physician (Radiation Oncology) Gwyndolyn Kaufman, RN (Inactive) as Registered Nurse  DIAGNOSIS:    ICD-10-CM   1. Malignant neoplasm of upper-outer quadrant of left breast in female, estrogen receptor positive ([place])  C50.412    Z17.0       SUMMARY OF ONCOLOGIC HISTORY: Oncology History  Malignant neoplasm of upper-outer quadrant of left breast in female, estrogen receptor positive ([place])  09/19/2020 Initial Diagnosis   Mammogram in 07/2018 showed probably benign bilateral breast masses that were stable on her last mammogram on 07/26/19. Mammogram on 08/28/20 showed the stable bilateral masses, a new 0.9cm mass at the 1 o'clock position in the left breast, and one abnormal left axillary lymph node with 0.6cm cortical thickening. 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    10/02/2020 Genetic Testing   Negative genetic testing: no pathogenic variants detected in [facility] Common-Hereditary Cancers Panel.  Variants of uncertain significance detected in APC c.6918T>A (p.Asp2306Glu) and POLE  c.2612G>C (p.Ser871Thr).  The report date is November 24. 2021.   The Common Hereditary Cancers Panel offered by [facility] includes sequencing and/or deletion duplication testing of the following 48 genes: APC, ATM, AXIN2, BARD1, BMPR1A, BRCA1, BRCA2, BRIP1, CDH1, CDK4, CDKN2A (p14ARF), CDKN2A (p16INK4a), CHEK2, CTNNA1, DICER1, EPCAM (Deletion/duplication testing only), GREM1 (promoter region deletion/duplication testing only), KIT, MEN1, MLH1, MSH2, MSH3, MSH6, MUTYH, NBN, NF1,  NHTL1, PALB2, PDGFRA, PMS2, POLD1, POLE, PTEN, RAD50, RAD51C, RAD51D, RNF43, SDHB, SDHC, SDHD, SMAD4, SMARCA4. STK11, TP53, TSC1, TSC2, and VHL.  The following genes were evaluated for sequence changes only: SDHA and HOXB13 c.251G>A variant only.   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    12/04/2020 Cancer Staging   Staging form: Breast, AJCC 8th Edition - Pathologic stage from 12/04/2020: Stage IA (pT1c, pN1a, cM0, G2, ER+, PR+, HER2-) - Signed by [name], NP on 12/18/2020  Histologic grading system: 3 grade system    01/22/2021 -  Chemotherapy    Patient is on Treatment Plan: BREAST ADJUVANT DOSE DENSE AC Q14D / PACLITAXEL Q7D         CHIEF COMPLIANT: Cycle 4 Taxol  INTERVAL HISTORY: Jennifer Davis is a 45 y.o. with above-mentioned history of left breast cancer who underwent bilateral mastectomies with reconstruction and is currently on adjuvant chemotherapy with weekly Taxol after completing 4 cycles of Adriamycin and Cytoxan. She presents to the clinic today for a toxicity check and cycle 4.  Her major complaint today is wheezing and chest tightness and a cough that is persistent.  She was treated with antibiotics last week for upper respiratory infection.  The infection appears to be improved and there is no more fevers but the cough is still persistent.  ALLERGIES:  is allergic to bee venom and other.  MEDICATIONS:  Current Outpatient Medications  Medication Sig Dispense Refill   predniSONE (STERAPRED UNI-PAK 21 TAB) 10 MG (21) TBPK tablet 6 tabs on day 1, 5 tabs on day 2,  4 on day 3, 3 on day 4, 2 on day 5 and 1 tab on day 6 21 tablet 0   acetaminophen (TYLENOL) 500 MG tablet TAKE 1 TABLET (500 MG TOTAL) BY MOUTH EVERY 6 (SIX) HOURS AS NEEDED. FOR USE AFTER SURGERY  (Patient taking differently: Take 500 mg by mouth every 6 (six) hours as needed for mild pain, fever or headache.) 30 tablet 0   amoxicillin-clavulanate (AUGMENTIN) 875-125 MG tablet Take 1 tablet by mouth 2 (two) times daily. 14 tablet 0   budesonide-formoterol (SYMBICORT) 160-4.5 MCG/ACT inhaler INHALE 2 PUFFS BY MOUTH 2 TIMES A DAY 10.2 g 5   busPIRone (BUSPAR) 30 MG tablet TAKE 1 TABLET (30 MG TOTAL) BY MOUTH 2 (TWO) TIMES DAILY. 60 tablet 5   cetirizine (ZYRTEC) 10 MG tablet Take 10 mg by mouth daily.     diazepam (VALIUM) 2 MG tablet TAKE 1 TABLET (2 MG TOTAL) BY MOUTH EVERY 12 (TWELVE) HOURS AS NEEDED FOR UP TO 15 DAYS FOR MUSCLE SPASMS. 30 tablet 0   DULoxetine (CYMBALTA) 60 MG capsule TAKE 2 CAPSULES (120 MG TOTAL) BY MOUTH DAILY. 60 capsule 5   EPINEPHrine (EPIPEN 2-PAK) 0.3 mg/0.3 mL IJ SOAJ injection Inject 0.3 mLs (0.3 mg total) into the muscle once as needed for up to 1 dose (for severe allergic reaction). CAll 911 immediately if you have to use this medicine 1 each 1   fluticasone (FLONASE) 50 MCG/ACT nasal spray Place 2 sprays into both nostrils daily. 16 g 6   gabapentin (NEURONTIN) 100 MG capsule Take 1 capsule (100 mg total) by mouth 3 (three) times daily as needed. (Patient taking differently: Take 100 mg by mouth 3 (three) times daily as needed (neuropathy).) 90 capsule 1   ibuprofen (ADVIL) 600 MG tablet TAKE 1 TABLET (600 MG TOTAL) BY MOUTH EVERY 6 (SIX) HOURS AS NEEDED FOR MILD PAIN OR MODERATE PAIN. FOR USE AFTER SURGERY 30 tablet 0   lidocaine-prilocaine (EMLA) cream APPLY TO AFFECTED AREA ONCE (Patient not taking: Reported on 04/22/2021) 30 g 3   loratadine (CLARITIN) 10 MG tablet Take 10 mg by mouth daily.     LORazepam (ATIVAN) 0.5 MG tablet Take 0.5 mg by mouth at bedtime as needed (nausea).     melatonin 5 MG TABS Take 5 mg by mouth at bedtime as needed (sleep).     montelukast (SINGULAIR) 10 MG tablet Take 10 mg by mouth at bedtime.  5   Multiple Vitamin (MULTIVITAMIN  WITH MINERALS) TABS tablet Take 1 tablet by mouth daily.     Multiple Vitamins-Minerals (MULTIVITAMIN PO) Take 2 tablets by mouth daily. Skin/hair/nail supplement.     ondansetron (ZOFRAN-ODT) 4 MG disintegrating tablet TAKE 1 TABLET (4 MG TOTAL) BY MOUTH EVERY 8 (EIGHT) HOURS AS NEEDED FOR NAUSEA OR VOMITING. (Patient not taking: Reported on 04/22/2021) 20 tablet 0   PROAIR HFA 108 (90 Base) MCG/ACT inhaler Inhale 2 puffs into the lungs as needed for wheezing or shortness of breath.  0   prochlorperazine (COMPAZINE) 10 MG tablet TAKE 1 TABLET (10 MG TOTAL) BY MOUTH EVERY 6 (SIX) HOURS AS NEEDED (NAUSEA OR VOMITING). 30 tablet 1   No current facility-administered medications for this visit.    PHYSICAL EXAMINATION: ECOG PERFORMANCE STATUS: 1 - Symptomatic but completely ambulatory  Vitals:   04/24/21 1135  BP: 136/70  Pulse: 98  Resp: 18  Temp: 97.8 F (36.6 C)  SpO2: 98%   Filed Weights   04/24/21 1135  Weight: 220 lb 14.4  oz (100.2 kg)    LABORATORY DATA:  I have reviewed the data as listed CMP Latest Ref Rng & Units 04/17/2021 04/03/2021 03/25/2021  Glucose 70 - 99 mg/dL 123(H) 133(H) 126(H)  BUN 6 - 20 mg/dL _0 Creatinine 0.44 - 1.00 mg/dL 0.72 0.74 0.77  Sodium 135 - 145 mmol/L 140 140 139  Potassium 3.5 - 5.1 mmol/L 4.2 3.6 3.4(L)  Chloride 98 - 111 mmol/L 106 104 105  CO2 22 - 32 mmol/L _1 Calcium 8.9 - 10.3 mg/dL 9.2 9.0 9.0  Total Protein 6.5 - 8.1 g/dL 7.1 6.7 6.5  Total Bilirubin 0.3 - 1.2 mg/dL 0.3 0.3 <0.2(L)  Alkaline Phos 38 - 126 U/L 95 93 121  AST 15 - 41 U/L 40 48(H) 32  ALT 0 - 44 U/L 56(H) 70(H) 56(H)    Lab Results  Component Value Date   WBC 8.5 04/24/2021   HGB 8.3 (L) 04/24/2021   HCT 27.6 (L) 04/24/2021   MCV 86.0 04/24/2021   PLT 419 (H) 04/24/2021   NEUTROABS 6.0 04/24/2021    ASSESSMENT & PLAN:  Malignant neoplasm of upper-outer quadrant of left breast in female, estrogen receptor positive ([place]) 09/20/2019: Screening mammogram  on 08/28/20 showed the stable bilateral masses, a new 0.9cm mass at the 1 o'clock position in the left breast, and one abnormal left axillary lymph node with 0.6cm cortical thickening. 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  MammaPrint: High risk: Luminal type B, 5-year metastasis free survival with chemo and hormone therapy: 93%, absolute benefit from chemo greater than 12%, average 10-year risk of recurrence untreated: 29%   Treatment plan: 1.  Mastectomy with targeted node dissection 2.  adjuvant chemotherapy with dose dense Adriamycin and Cytoxan followed by Taxol 3.  Adjuvant radiation 4.  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  -------------------------------------------------------------------------------------------------- Current Treatment: Completed 4 cycles of dose dense Adriamycin and Cytoxan,  today cycle 4 of Taxol 12/30/20: ECHO EF 65-70% Labs reviewed   Chemo Toxicities: 1.  Fatigue: Improvement.  She thinks this is a walk in the park compared to previous chemo. 2. Nausea: Improved 3.  Anemia: Hemoglobin 8.8: On oral iron therapy 4.  Constipation: Patient is taking more fiber in her diet and its helped her.   Upper respite tract infection: Treated with antibiotics.  Symptoms improved but she continues to have some wheezing and cough.  I sent a prescription for prednisone she will take a tapering schedule over the next 6 days.  I discussed with her about watching her carbohydrate intake so that she does not gain weight.  RTC weekly for Taxol and every  other week for follow-up with me.      No orders of the defined types were placed in this encounter.  The patient has a good understanding of the overall plan. she agrees with it. she will call with any problems that may develop before the next visit here.  Total time spent: 30 mins including face to face time and time spent for planning, charting and coordination of care  Rulon Eisenmenger, MD, MPH 04/24/2021  I, Thana Ates, am acting as scribe for Dr. Nicholas Lose.  I have reviewed the above  documentation for accuracy and completeness, and I agree with the above.

## 2021-04-24 ENCOUNTER — Inpatient Hospital Stay (HOSPITAL_BASED_OUTPATIENT_CLINIC_OR_DEPARTMENT_OTHER): Payer: 59 | Admitting: Hematology and Oncology

## 2021-04-24 ENCOUNTER — Inpatient Hospital Stay: Payer: 59

## 2021-04-24 ENCOUNTER — Other Ambulatory Visit: Payer: Self-pay

## 2021-04-24 ENCOUNTER — Other Ambulatory Visit (HOSPITAL_COMMUNITY): Payer: Self-pay

## 2021-04-24 ENCOUNTER — Encounter: Payer: Self-pay | Admitting: *Deleted

## 2021-04-24 DIAGNOSIS — Z170 Estrogen receptor positive status [ER+]: Secondary | ICD-10-CM | POA: Diagnosis not present

## 2021-04-24 DIAGNOSIS — D6481 Anemia due to antineoplastic chemotherapy: Secondary | ICD-10-CM | POA: Diagnosis not present

## 2021-04-24 DIAGNOSIS — C50412 Malignant neoplasm of upper-outer quadrant of left female breast: Secondary | ICD-10-CM

## 2021-04-24 DIAGNOSIS — C773 Secondary and unspecified malignant neoplasm of axilla and upper limb lymph nodes: Secondary | ICD-10-CM | POA: Diagnosis not present

## 2021-04-24 DIAGNOSIS — Z5111 Encounter for antineoplastic chemotherapy: Secondary | ICD-10-CM | POA: Diagnosis not present

## 2021-04-24 DIAGNOSIS — Z95828 Presence of other vascular implants and grafts: Secondary | ICD-10-CM

## 2021-04-24 LAB — CBC WITH DIFFERENTIAL/PLATELET
Abs Immature Granulocytes: 0.43 K/uL — ABNORMAL HIGH (ref 0.00–0.07)
Basophils Absolute: 0 K/uL (ref 0.0–0.1)
Basophils Relative: 0 %
Eosinophils Absolute: 0.6 K/uL — ABNORMAL HIGH (ref 0.0–0.5)
Eosinophils Relative: 7 %
HCT: 27.6 % — ABNORMAL LOW (ref 36.0–46.0)
Hemoglobin: 8.3 g/dL — ABNORMAL LOW (ref 12.0–15.0)
Immature Granulocytes: 5 %
Lymphocytes Relative: 13 %
Lymphs Abs: 1.1 K/uL (ref 0.7–4.0)
MCH: 25.9 pg — ABNORMAL LOW (ref 26.0–34.0)
MCHC: 30.1 g/dL (ref 30.0–36.0)
MCV: 86 fL (ref 80.0–100.0)
Monocytes Absolute: 0.4 K/uL (ref 0.1–1.0)
Monocytes Relative: 5 %
Neutro Abs: 6 K/uL (ref 1.7–7.7)
Neutrophils Relative %: 70 %
Platelets: 419 K/uL — ABNORMAL HIGH (ref 150–400)
RBC: 3.21 MIL/uL — ABNORMAL LOW (ref 3.87–5.11)
RDW: 22.5 % — ABNORMAL HIGH (ref 11.5–15.5)
WBC: 8.5 K/uL (ref 4.0–10.5)
nRBC: 1.4 % — ABNORMAL HIGH (ref 0.0–0.2)

## 2021-04-24 LAB — CMP (CANCER CENTER ONLY)
ALT: 41 U/L (ref 0–44)
AST: 29 U/L (ref 15–41)
Albumin: 3.2 g/dL — ABNORMAL LOW (ref 3.5–5.0)
Alkaline Phosphatase: 83 U/L (ref 38–126)
Anion gap: 9 (ref 5–15)
BUN: 8 mg/dL (ref 6–20)
CO2: 24 mmol/L (ref 22–32)
Calcium: 8.8 mg/dL — ABNORMAL LOW (ref 8.9–10.3)
Chloride: 108 mmol/L (ref 98–111)
Creatinine: 0.69 mg/dL (ref 0.44–1.00)
GFR, Estimated: >60 mL/min
Glucose, Bld: 128 mg/dL — ABNORMAL HIGH (ref 70–99)
Potassium: 3.5 mmol/L (ref 3.5–5.1)
Sodium: 141 mmol/L (ref 135–145)
Total Bilirubin: <0.2 mg/dL — ABNORMAL LOW (ref 0.3–1.2)
Total Protein: 6.4 g/dL — ABNORMAL LOW (ref 6.5–8.1)

## 2021-04-24 MED ORDER — SODIUM CHLORIDE 0.9 % IV SOLN
80.0000 mg/m2 | Freq: Once | INTRAVENOUS | Status: AC
  Administered 2021-04-24: 150 mg via INTRAVENOUS
  Filled 2021-04-24: qty 25

## 2021-04-24 MED ORDER — FAMOTIDINE 20 MG IN NS 100 ML IVPB
20.0000 mg | Freq: Once | INTRAVENOUS | Status: AC
  Administered 2021-04-24: 20 mg via INTRAVENOUS

## 2021-04-24 MED ORDER — SODIUM CHLORIDE 0.9 % IV SOLN
Freq: Once | INTRAVENOUS | Status: AC
  Filled 2021-04-24: qty 250

## 2021-04-24 MED ORDER — DIPHENHYDRAMINE HCL 50 MG/ML IJ SOLN
INTRAMUSCULAR | Status: AC
  Filled 2021-04-24: qty 1

## 2021-04-24 MED ORDER — DIPHENHYDRAMINE HCL 50 MG/ML IJ SOLN
25.0000 mg | Freq: Once | INTRAMUSCULAR | Status: AC
Start: 2021-04-24 — End: 2021-04-24
  Administered 2021-04-24: 25 mg via INTRAVENOUS

## 2021-04-24 MED ORDER — SODIUM CHLORIDE 0.9 % IV SOLN
10.0000 mg | Freq: Once | INTRAVENOUS | Status: AC
  Administered 2021-04-24: 10 mg via INTRAVENOUS
  Filled 2021-04-24: qty 10
  Filled 2021-04-24: qty 1

## 2021-04-24 MED ORDER — SODIUM CHLORIDE 0.9% FLUSH
10.0000 mL | Freq: Once | INTRAVENOUS | Status: AC
  Administered 2021-04-24: 10 mL
  Filled 2021-04-24: qty 10

## 2021-04-24 MED ORDER — FAMOTIDINE 20 MG IN NS 100 ML IVPB
INTRAVENOUS | Status: AC
  Filled 2021-04-24: qty 100

## 2021-04-24 MED ORDER — PREDNISONE 10 MG (21) PO TBPK
ORAL_TABLET | ORAL | 0 refills | Status: DC
  Filled 2021-04-24: qty 21, 6d supply, fill #0

## 2021-05-01 ENCOUNTER — Inpatient Hospital Stay: Payer: 59

## 2021-05-01 ENCOUNTER — Other Ambulatory Visit: Payer: Self-pay | Admitting: Hematology and Oncology

## 2021-05-01 ENCOUNTER — Other Ambulatory Visit: Payer: Self-pay

## 2021-05-01 ENCOUNTER — Other Ambulatory Visit (HOSPITAL_COMMUNITY): Payer: Self-pay

## 2021-05-01 ENCOUNTER — Ambulatory Visit (HOSPITAL_COMMUNITY)
Admission: RE | Admit: 2021-05-01 | Discharge: 2021-05-01 | Disposition: A | Discharge status: Home / Self Care | Payer: 59 | Source: Ambulatory Visit | Attending: Hematology and Oncology | Admitting: Hematology and Oncology

## 2021-05-01 VITALS — BP 135/79 | HR 99 | Temp 98.2°F | Resp 18 | Wt 217.1 lb

## 2021-05-01 DIAGNOSIS — Z170 Estrogen receptor positive status [ER+]: Secondary | ICD-10-CM | POA: Diagnosis not present

## 2021-05-01 DIAGNOSIS — Z5111 Encounter for antineoplastic chemotherapy: Secondary | ICD-10-CM | POA: Diagnosis not present

## 2021-05-01 DIAGNOSIS — C50412 Malignant neoplasm of upper-outer quadrant of left female breast: Secondary | ICD-10-CM | POA: Diagnosis not present

## 2021-05-01 DIAGNOSIS — R0602 Shortness of breath: Secondary | ICD-10-CM

## 2021-05-01 DIAGNOSIS — Z95828 Presence of other vascular implants and grafts: Secondary | ICD-10-CM

## 2021-05-01 DIAGNOSIS — C773 Secondary and unspecified malignant neoplasm of axilla and upper limb lymph nodes: Secondary | ICD-10-CM | POA: Diagnosis not present

## 2021-05-01 DIAGNOSIS — D6481 Anemia due to antineoplastic chemotherapy: Secondary | ICD-10-CM | POA: Diagnosis not present

## 2021-05-01 LAB — CBC WITH DIFFERENTIAL/PLATELET
Abs Immature Granulocytes: 0.2 10*3/uL — ABNORMAL HIGH (ref 0.00–0.07)
Basophils Absolute: 0.1 10*3/uL (ref 0.0–0.1)
Basophils Relative: 1 %
Eosinophils Absolute: 0.2 10*3/uL (ref 0.0–0.5)
Eosinophils Relative: 2 %
HCT: 30.1 % — ABNORMAL LOW (ref 36.0–46.0)
Hemoglobin: 9.3 g/dL — ABNORMAL LOW (ref 12.0–15.0)
Immature Granulocytes: 2 %
Lymphocytes Relative: 13 %
Lymphs Abs: 1.2 10*3/uL (ref 0.7–4.0)
MCH: 26.7 pg (ref 26.0–34.0)
MCHC: 30.9 g/dL (ref 30.0–36.0)
MCV: 86.5 fL (ref 80.0–100.0)
Monocytes Absolute: 0.5 10*3/uL (ref 0.1–1.0)
Monocytes Relative: 5 %
Neutro Abs: 7 10*3/uL (ref 1.7–7.7)
Neutrophils Relative %: 77 %
Platelets: 495 10*3/uL — ABNORMAL HIGH (ref 150–400)
RBC: 3.48 MIL/uL — ABNORMAL LOW (ref 3.87–5.11)
RDW: 23.5 % — ABNORMAL HIGH (ref 11.5–15.5)
WBC: 9.1 10*3/uL (ref 4.0–10.5)
nRBC: 0.6 % — ABNORMAL HIGH (ref 0.0–0.2)

## 2021-05-01 LAB — COMPREHENSIVE METABOLIC PANEL
ALT: 29 U/L (ref 0–44)
AST: 16 U/L (ref 15–41)
Albumin: 3.6 g/dL (ref 3.5–5.0)
Alkaline Phosphatase: 69 U/L (ref 38–126)
Anion gap: 11 (ref 5–15)
BUN: 17 mg/dL (ref 6–20)
CO2: 26 mmol/L (ref 22–32)
Calcium: 9.1 mg/dL (ref 8.9–10.3)
Chloride: 104 mmol/L (ref 98–111)
Creatinine, Ser: 0.75 mg/dL (ref 0.44–1.00)
GFR, Estimated: >60 mL/min (ref >60)
Glucose, Bld: 119 mg/dL — ABNORMAL HIGH (ref 70–99)
Potassium: 3.5 mmol/L (ref 3.5–5.1)
Sodium: 141 mmol/L (ref 135–145)
Total Bilirubin: 0.3 mg/dL (ref 0.3–1.2)
Total Protein: 6.8 g/dL (ref 6.5–8.1)

## 2021-05-01 MED ORDER — SODIUM CHLORIDE 0.9 % IV SOLN
10.0000 mg | Freq: Once | INTRAVENOUS | Status: AC
  Administered 2021-05-01: 10 mg via INTRAVENOUS
  Filled 2021-05-01: qty 10

## 2021-05-01 MED ORDER — FAMOTIDINE 20 MG IN NS 100 ML IVPB
INTRAVENOUS | Status: AC
  Filled 2021-05-01: qty 100

## 2021-05-01 MED ORDER — HEPARIN SOD (PORK) LOCK FLUSH 100 UNIT/ML IV SOLN
500.0000 [IU] | Freq: Once | INTRAVENOUS | Status: AC | PRN
  Administered 2021-05-01: 500 [IU]
  Filled 2021-05-01: qty 5

## 2021-05-01 MED ORDER — SODIUM CHLORIDE 0.9% FLUSH
10.0000 mL | Freq: Once | INTRAVENOUS | Status: AC
  Administered 2021-05-01: 10 mL
  Filled 2021-05-01: qty 10

## 2021-05-01 MED ORDER — FAMOTIDINE 20 MG IN NS 100 ML IVPB
20.0000 mg | Freq: Once | INTRAVENOUS | Status: AC
Start: 2021-05-01 — End: 2021-05-01
  Administered 2021-05-01: 20 mg via INTRAVENOUS

## 2021-05-01 MED ORDER — AZITHROMYCIN 250 MG PO TABS
ORAL_TABLET | ORAL | 0 refills | Status: DC
  Filled 2021-05-01: qty 6, 5d supply, fill #0

## 2021-05-01 MED ORDER — SODIUM CHLORIDE 0.9 % IV SOLN
80.0000 mg/m2 | Freq: Once | INTRAVENOUS | Status: AC
  Administered 2021-05-01: 150 mg via INTRAVENOUS
  Filled 2021-05-01: qty 25

## 2021-05-01 MED ORDER — DIPHENHYDRAMINE HCL 50 MG/ML IJ SOLN
25.0000 mg | Freq: Once | INTRAMUSCULAR | Status: AC
  Administered 2021-05-01: 25 mg via INTRAVENOUS

## 2021-05-01 MED ORDER — DIPHENHYDRAMINE HCL 50 MG/ML IJ SOLN
INTRAMUSCULAR | Status: AC
  Filled 2021-05-01: qty 1

## 2021-05-01 MED ORDER — SODIUM CHLORIDE 0.9% FLUSH
10.0000 mL | INTRAVENOUS | Status: DC | PRN
  Administered 2021-05-01: 10 mL
  Filled 2021-05-01: qty 10

## 2021-05-01 MED ORDER — SODIUM CHLORIDE 0.9 % IV SOLN
Freq: Once | INTRAVENOUS | Status: AC
  Filled 2021-05-01: qty 250

## 2021-05-01 NOTE — Patient Instructions (Signed)
[facility]  Discharge Instructions: Thank you for choosing [facility] to provide your oncology and hematology care.   If you have a lab appointment with the [facility], please go directly to the [facility] and check in at the registration area.   Wear comfortable clothing and clothing appropriate for easy access to any Portacath or PICC line.   We strive to give you quality time with your provider. You may need to reschedule your appointment if you arrive late (15 or more minutes).  Arriving late affects you and other patients whose appointments are after yours.  Also, if you miss three or more appointments without notifying the office, you may be dismissed from the clinic at the provider's discretion.      For prescription refill requests, have your pharmacy contact our office and allow 72 hours for refills to be completed.    Today you received the following chemotherapy and/or immunotherapy agents Paclitaxel (Taxol).      To help prevent nausea and vomiting after your treatment, we encourage you to take your nausea medication as directed.  BELOW ARE SYMPTOMS THAT SHOULD BE REPORTED IMMEDIATELY: *FEVER GREATER THAN 100.4 F (38 C) OR HIGHER *CHILLS OR SWEATING *NAUSEA AND VOMITING THAT IS NOT CONTROLLED WITH YOUR NAUSEA MEDICATION *UNUSUAL SHORTNESS OF BREATH *UNUSUAL BRUISING OR BLEEDING *URINARY PROBLEMS (pain or burning when urinating, or frequent urination) *BOWEL PROBLEMS (unusual diarrhea, constipation, pain near the anus) TENDERNESS IN MOUTH AND THROAT WITH OR WITHOUT PRESENCE OF ULCERS (sore throat, sores in mouth, or a toothache) UNUSUAL RASH, SWELLING OR PAIN  UNUSUAL VAGINAL DISCHARGE OR ITCHING   Items with * indicate a potential emergency and should be followed up as soon as possible or go to the Emergency Department if any problems should occur.  Please show the CHEMOTHERAPY ALERT CARD or IMMUNOTHERAPY ALERT CARD at  check-in to the Emergency Department and triage nurse.  Should you have questions after your visit or need to cancel or reschedule your appointment, please contact [facility]  Dept: [phone]  and follow the prompts.  Office hours are 8:00 a.m. to 4:30 p.m. Monday - Friday. Please note that voicemails left after 4:00 p.m. may not be returned until the following business day.  We are closed weekends and major holidays. You have access to a nurse at all times for urgent questions. Please call the main number to the clinic Dept: [phone] and follow the prompts.   For any non-urgent questions, you may also contact your provider using MyChart. We now offer e-Visits for anyone 55 and older to request care online for non-urgent symptoms. For details visit [email or web address].   Also download the MyChart app! Go to the app store, search "MyChart", open the app, select [facility], and log in with your MyChart username and password.  Due to Covid, a mask is required upon entering the hospital/clinic. If you do not have a mask, one will be given to you upon arrival. For doctor visits, patients may have 1 support person aged 6 or older with them. For treatment visits, patients cannot have anyone with them due to current Covid guidelines and our immunocompromised population.

## 2021-05-01 NOTE — Progress Notes (Signed)
Chest x-ray being obtained for shortness of breath and bronchitis symptoms.

## 2021-05-01 NOTE — Progress Notes (Signed)
Acute bronchitis: Chest x-ray to my review does not reveal any infiltrates. I still recommend azithromycin and she has inhalers and nebulizers at home which she will use. Shortness of breath related to chemotherapy.  Hemoglobin is slowly improving.

## 2021-05-07 NOTE — Progress Notes (Signed)
Patient Care Team: Wenda Low, MD as PCP - General (Internal Medicine) Rockwell Germany, RN as Oncology Nurse Navigator Mauro Kaufmann, RN as Oncology Nurse Navigator Coralie Keens, MD as Consulting Physician (General Surgery) Nicholas Lose, MD as Consulting Physician (Hematology and Oncology) Gery Pray, MD as Consulting Physician (Radiation Oncology) Gwyndolyn Kaufman, RN (Inactive) as Registered Nurse  DIAGNOSIS:    ICD-10-CM   1. Malignant neoplasm of upper-outer quadrant of left breast in female, estrogen receptor positive ([place])  C50.412    Z17.0       SUMMARY OF ONCOLOGIC HISTORY: Oncology History  Malignant neoplasm of upper-outer quadrant of left breast in female, estrogen receptor positive ([place])  09/19/2020 Initial Diagnosis   Mammogram in 07/2018 showed probably benign bilateral breast masses that were stable on her last mammogram on 07/26/19. Mammogram on 08/28/20 showed the stable bilateral masses, a new 0.9cm mass at the 1 o'clock position in the left breast, and one abnormal left axillary lymph node with 0.6cm cortical thickening. 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    10/02/2020 Genetic Testing   Negative genetic testing: no pathogenic variants detected in [facility] Common-Hereditary Cancers Panel.  Variants of uncertain significance detected in APC c.6918T>A (p.Asp2306Glu) and POLE  c.2612G>C (p.Ser871Thr).  The report date is November 24. 2021.   The Common Hereditary Cancers Panel offered by [facility] includes sequencing and/or deletion duplication testing of the following 48 genes: APC, ATM, AXIN2, BARD1, BMPR1A, BRCA1, BRCA2, BRIP1, CDH1, CDK4, CDKN2A (p14ARF), CDKN2A (p16INK4a), CHEK2, CTNNA1, DICER1, EPCAM (Deletion/duplication testing only), GREM1 (promoter region deletion/duplication testing only), KIT, MEN1, MLH1, MSH2, MSH3, MSH6, MUTYH, NBN, NF1,  NHTL1, PALB2, PDGFRA, PMS2, POLD1, POLE, PTEN, RAD50, RAD51C, RAD51D, RNF43, SDHB, SDHC, SDHD, SMAD4, SMARCA4. STK11, TP53, TSC1, TSC2, and VHL.  The following genes were evaluated for sequence changes only: SDHA and HOXB13 c.251G>A variant only.   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    12/04/2020 Cancer Staging   Staging form: Breast, AJCC 8th Edition - Pathologic stage from 12/04/2020: Stage IA (pT1c, pN1a, cM0, G2, ER+, PR+, HER2-) - Signed by [name], NP on 12/18/2020  Histologic grading system: 3 grade system    01/22/2021 -  Chemotherapy    Patient is on Treatment Plan: BREAST ADJUVANT DOSE DENSE AC Q14D / PACLITAXEL Q7D         CHIEF COMPLIANT: Cycle 6 Taxol  INTERVAL HISTORY: Jennifer Davis is a 45 y.o. with above-mentioned history of  left breast cancer who underwent bilateral mastectomies with reconstruction and is currently on adjuvant chemotherapy with weekly Taxol after completing 4 cycles of Adriamycin and Cytoxan. She presents to the clinic today for a toxicity check and cycle 6.  Recent upper respiratory infection required antibiotics.  She is finally improving.  ALLERGIES:  is allergic to bee venom and other.  MEDICATIONS:  Current Outpatient Medications  Medication Sig Dispense Refill   acetaminophen (TYLENOL) 500 MG tablet TAKE 1 TABLET (500 MG TOTAL) BY MOUTH EVERY 6 (SIX) HOURS AS NEEDED. FOR USE AFTER SURGERY (Patient taking differently: Take 500 mg by mouth every 6 (six) hours as needed for mild pain, fever or headache.) 30 tablet 0   amoxicillin-clavulanate (AUGMENTIN) 875-125 MG tablet Take  1 tablet by mouth 2 (two) times daily. 14 tablet 0   azithromycin (ZITHROMAX Z-PAK) 250 MG tablet Take 1 tablets by mouth now, then take 1 tab by mouth daily  until gone 6 each 0   budesonide-formoterol (SYMBICORT) 160-4.5 MCG/ACT inhaler INHALE 2 PUFFS BY MOUTH 2 TIMES A DAY 10.2 g 5   busPIRone (BUSPAR) 30 MG tablet TAKE 1 TABLET (30 MG TOTAL) BY MOUTH 2 (TWO) TIMES DAILY. 60 tablet 5   cetirizine (ZYRTEC) 10 MG tablet Take 10 mg by mouth daily.     diazepam (VALIUM) 2 MG tablet TAKE 1 TABLET (2 MG TOTAL) BY MOUTH EVERY 12 (TWELVE) HOURS AS NEEDED FOR UP TO 15 DAYS FOR MUSCLE SPASMS. 30 tablet 0   DULoxetine (CYMBALTA) 60 MG capsule TAKE 2 CAPSULES (120 MG TOTAL) BY MOUTH DAILY. 60 capsule 5   EPINEPHrine (EPIPEN 2-PAK) 0.3 mg/0.3 mL IJ SOAJ injection Inject 0.3 mLs (0.3 mg total) into the muscle once as needed for up to 1 dose (for severe allergic reaction). CAll 911 immediately if you have to use this medicine 1 each 1   fluticasone (FLONASE) 50 MCG/ACT nasal spray Place 2 sprays into both nostrils daily. 16 g 6   gabapentin (NEURONTIN) 100 MG capsule Take 1 capsule (100 mg total) by mouth 3 (three) times daily as needed. (Patient taking differently: Take 100 mg by mouth 3 (three) times daily as needed (neuropathy).) 90 capsule 1   ibuprofen (ADVIL) 600 MG tablet TAKE 1 TABLET (600 MG TOTAL) BY MOUTH EVERY 6 (SIX) HOURS AS NEEDED FOR MILD PAIN OR MODERATE PAIN. FOR USE AFTER SURGERY 30 tablet 0   lidocaine-prilocaine (EMLA) cream APPLY TO AFFECTED AREA ONCE (Patient not taking: Reported on 04/22/2021) 30 g 3   loratadine (CLARITIN) 10 MG tablet Take 10 mg by mouth daily.     LORazepam (ATIVAN) 0.5 MG tablet Take 0.5 mg by mouth at bedtime as needed (nausea).     melatonin 5 MG TABS Take 5 mg by mouth at bedtime as needed (sleep).     montelukast (SINGULAIR) 10 MG tablet Take 10 mg by mouth at bedtime.  5   Multiple Vitamin (MULTIVITAMIN WITH MINERALS) TABS tablet Take 1 tablet by mouth daily.     Multiple Vitamins-Minerals (MULTIVITAMIN PO) Take 2 tablets by mouth daily. Skin/hair/nail supplement.     ondansetron (ZOFRAN-ODT) 4 MG disintegrating tablet  TAKE 1 TABLET (4 MG TOTAL) BY MOUTH EVERY 8 (EIGHT) HOURS AS NEEDED FOR NAUSEA OR VOMITING. (Patient not taking: Reported on 04/22/2021) 20 tablet 0   predniSONE (STERAPRED UNI-PAK 21 TAB) 10 MG (21) TBPK tablet Take 6 tabs by mouth on day 1, 5 tabs on day 2, 4 tabs on day 3, 3 tabs on day 4, 2 tabs on day 5 and 1 tab on day 6 21 tablet 0   PROAIR HFA 108 (90 Base) MCG/ACT inhaler Inhale 2 puffs into the lungs as needed for wheezing or shortness of breath.  0   prochlorperazine (COMPAZINE) 10 MG tablet TAKE 1 TABLET (10 MG TOTAL) BY MOUTH EVERY 6 (SIX) HOURS AS NEEDED (NAUSEA OR VOMITING). 30 tablet 1   No current facility-administered medications for this visit.    PHYSICAL EXAMINATION: ECOG PERFORMANCE STATUS: 1 - Symptomatic but completely ambulatory  Vitals:   05/08/21 0941  BP: (!) 148/83  Pulse: 99  Resp: 18  Temp: 97.9 F (36.6 C)  SpO2: 100%   Filed Weights   05/08/21 0941  Weight: 222 lb 3.2 oz (  100.8 kg)    LABORATORY DATA:  I have reviewed the data as listed CMP Latest Ref Rng & Units 05/01/2021 04/24/2021 04/17/2021  Glucose 70 - 99 mg/dL 119(H) 128(H) 123(H)  BUN 6 - 20 mg/dL _0 Creatinine 0.44 - 1.00 mg/dL 0.75 0.69 0.72  Sodium 135 - 145 mmol/L 141 141 140  Potassium 3.5 - 5.1 mmol/L 3.5 3.5 4.2  Chloride 98 - 111 mmol/L 104 108 106  CO2 22 - 32 mmol/L _1 Calcium 8.9 - 10.3 mg/dL 9.1 8.8(L) 9.2  Total Protein 6.5 - 8.1 g/dL 6.8 6.4(L) 7.1  Total Bilirubin 0.3 - 1.2 mg/dL 0.3 <0.2(L) 0.3  Alkaline Phos 38 - 126 U/L 69 83 95  AST 15 - 41 U/L 16 29 40  ALT 0 - 44 U/L 29 41 56(H)    Lab Results  Component Value Date   WBC 5.7 05/08/2021   HGB 9.0 (L) 05/08/2021   HCT 29.3 (L) 05/08/2021   MCV 88.3 05/08/2021   PLT 403 (H) 05/08/2021   NEUTROABS 3.9 05/08/2021    ASSESSMENT & PLAN:  Malignant neoplasm of upper-outer quadrant of left breast in female, estrogen receptor positive ([place]) 09/20/2019: Screening mammogram on 08/28/20 showed the stable  bilateral masses, a new 0.9cm mass at the 1 o'clock position in the left breast, and one abnormal left axillary lymph node with 0.6cm cortical thickening. 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  MammaPrint: High risk: Luminal type B, 5-year metastasis free survival with chemo and hormone therapy: 93%, absolute benefit from chemo greater than 12%, average 10-year risk of recurrence untreated: 29%   Treatment plan: 1.  Mastectomy with targeted node dissection 2.  adjuvant chemotherapy with dose dense Adriamycin and Cytoxan followed by Taxol 3.  Adjuvant radiation 4.  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  -------------------------------------------------------------------------------------------------- Current Treatment: Completed 4 cycles of dose dense Adriamycin and Cytoxan,  today cycle 6 of Taxol 12/30/20: ECHO EF 65-70% Labs reviewed   Chemo Toxicities: 1.  Fatigue: Improvement.  She thinks this is a walk in the park compared to previous chemo. 2. Nausea: Improved 3.  Anemia: Hemoglobin 9: On oral iron therapy 4.  Constipation: Patient is taking more fiber in her diet and its helped her. 5.  Upper respiratory infection: Slowly improving.  She is done with antibiotics and prednisone.   Return to clinic weekly for Taxol and every other week for follow-up with me.   No orders of the defined types were placed in this encounter.  The patient has a good understanding of the overall plan. she agrees with it. she will call with any problems that may develop before  the next visit here.  Total time spent: 30 mins including face to face time and time spent for planning, charting and coordination of care  Rulon Eisenmenger, MD, MPH 05/08/2021  I, Reinaldo Raddle, am acting as scribe for Dr. Nicholas Lose, MD.  I have reviewed the above documentation for accuracy and completeness, and I agree with the above.

## 2021-05-08 ENCOUNTER — Inpatient Hospital Stay (HOSPITAL_BASED_OUTPATIENT_CLINIC_OR_DEPARTMENT_OTHER): Payer: 59 | Admitting: Hematology and Oncology

## 2021-05-08 ENCOUNTER — Inpatient Hospital Stay: Payer: 59

## 2021-05-08 ENCOUNTER — Other Ambulatory Visit: Payer: Self-pay

## 2021-05-08 DIAGNOSIS — Z5111 Encounter for antineoplastic chemotherapy: Secondary | ICD-10-CM | POA: Diagnosis not present

## 2021-05-08 DIAGNOSIS — C50412 Malignant neoplasm of upper-outer quadrant of left female breast: Secondary | ICD-10-CM

## 2021-05-08 DIAGNOSIS — Z170 Estrogen receptor positive status [ER+]: Secondary | ICD-10-CM | POA: Diagnosis not present

## 2021-05-08 DIAGNOSIS — D6481 Anemia due to antineoplastic chemotherapy: Secondary | ICD-10-CM | POA: Diagnosis not present

## 2021-05-08 DIAGNOSIS — C773 Secondary and unspecified malignant neoplasm of axilla and upper limb lymph nodes: Secondary | ICD-10-CM | POA: Diagnosis not present

## 2021-05-08 LAB — CMP (CANCER CENTER ONLY)
ALT: 44 U/L (ref 0–44)
AST: 35 U/L (ref 15–41)
Albumin: 3.3 g/dL — ABNORMAL LOW (ref 3.5–5.0)
Alkaline Phosphatase: 72 U/L (ref 38–126)
Anion gap: 7 (ref 5–15)
BUN: 9 mg/dL (ref 6–20)
CO2: 25 mmol/L (ref 22–32)
Calcium: 8.7 mg/dL — ABNORMAL LOW (ref 8.9–10.3)
Chloride: 108 mmol/L (ref 98–111)
Creatinine: 0.72 mg/dL (ref 0.44–1.00)
GFR, Estimated: >60 mL/min (ref >60)
Glucose, Bld: 126 mg/dL — ABNORMAL HIGH (ref 70–99)
Potassium: 3.6 mmol/L (ref 3.5–5.1)
Sodium: 140 mmol/L (ref 135–145)
Total Bilirubin: 0.3 mg/dL (ref 0.3–1.2)
Total Protein: 6.9 g/dL (ref 6.5–8.1)

## 2021-05-08 LAB — CBC WITH DIFFERENTIAL (CANCER CENTER ONLY)
Abs Immature Granulocytes: 0.15 10*3/uL — ABNORMAL HIGH (ref 0.00–0.07)
Basophils Absolute: 0 10*3/uL (ref 0.0–0.1)
Basophils Relative: 1 %
Eosinophils Absolute: 0.1 10*3/uL (ref 0.0–0.5)
Eosinophils Relative: 2 %
HCT: 29.3 % — ABNORMAL LOW (ref 36.0–46.0)
Hemoglobin: 9 g/dL — ABNORMAL LOW (ref 12.0–15.0)
Immature Granulocytes: 3 %
Lymphocytes Relative: 20 %
Lymphs Abs: 1.2 10*3/uL (ref 0.7–4.0)
MCH: 27.1 pg (ref 26.0–34.0)
MCHC: 30.7 g/dL (ref 30.0–36.0)
MCV: 88.3 fL (ref 80.0–100.0)
Monocytes Absolute: 0.4 10*3/uL (ref 0.1–1.0)
Monocytes Relative: 7 %
Neutro Abs: 3.9 10*3/uL (ref 1.7–7.7)
Neutrophils Relative %: 67 %
Platelet Count: 403 10*3/uL — ABNORMAL HIGH (ref 150–400)
RBC: 3.32 MIL/uL — ABNORMAL LOW (ref 3.87–5.11)
RDW: 22.6 % — ABNORMAL HIGH (ref 11.5–15.5)
WBC Count: 5.7 10*3/uL (ref 4.0–10.5)
nRBC: 1.2 % — ABNORMAL HIGH (ref 0.0–0.2)

## 2021-05-08 MED ORDER — DIPHENHYDRAMINE HCL 50 MG/ML IJ SOLN
INTRAMUSCULAR | Status: AC
  Filled 2021-05-08: qty 1

## 2021-05-08 MED ORDER — DIPHENHYDRAMINE HCL 50 MG/ML IJ SOLN
25.0000 mg | Freq: Once | INTRAMUSCULAR | Status: AC
  Administered 2021-05-08: 25 mg via INTRAVENOUS

## 2021-05-08 MED ORDER — PACLITAXEL CHEMO INJECTION 300 MG/50ML
80.0000 mg/m2 | Freq: Once | INTRAVENOUS | Status: AC
  Administered 2021-05-08: 150 mg via INTRAVENOUS
  Filled 2021-05-08: qty 25

## 2021-05-08 MED ORDER — SODIUM CHLORIDE 0.9 % IV SOLN
Freq: Once | INTRAVENOUS | Status: AC
  Filled 2021-05-08: qty 250

## 2021-05-08 MED ORDER — FAMOTIDINE 20 MG IN NS 100 ML IVPB
20.0000 mg | Freq: Once | INTRAVENOUS | Status: AC
Start: 2021-05-08 — End: 2021-05-08
  Administered 2021-05-08: 20 mg via INTRAVENOUS

## 2021-05-08 MED ORDER — COLD PACK MISC ONCOLOGY
1.0000 | Freq: Once | Status: AC | PRN
  Administered 2021-05-08: 1 via TOPICAL
  Filled 2021-05-08: qty 1

## 2021-05-08 MED ORDER — HEPARIN SOD (PORK) LOCK FLUSH 100 UNIT/ML IV SOLN
500.0000 [IU] | Freq: Once | INTRAVENOUS | Status: AC | PRN
  Administered 2021-05-08: 500 [IU]
  Filled 2021-05-08: qty 5

## 2021-05-08 MED ORDER — SODIUM CHLORIDE 0.9% FLUSH
10.0000 mL | INTRAVENOUS | Status: DC | PRN
  Administered 2021-05-08: 10 mL
  Filled 2021-05-08: qty 10

## 2021-05-08 MED ORDER — DEXAMETHASONE SODIUM PHOSPHATE 100 MG/10ML IJ SOLN
10.0000 mg | Freq: Once | INTRAMUSCULAR | Status: AC
  Administered 2021-05-08: 10 mg via INTRAVENOUS
  Filled 2021-05-08: qty 10

## 2021-05-08 MED ORDER — FAMOTIDINE 20 MG IN NS 100 ML IVPB
INTRAVENOUS | Status: AC
  Filled 2021-05-08: qty 100

## 2021-05-08 NOTE — Assessment & Plan Note (Signed)
09/20/2019: Screening mammogram on 08/28/20 showed the stable bilateral masses, a new 0.9cm mass at the 1 o'clock position in the left breast, and one abnormal left axillary lymph node with 0.6cm cortical thickening. 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 MammaPrint: High risk: Luminal type B, 5-year metastasis free survival with chemo and hormone therapy: 93%, absolute benefit from chemo greater than 12%, average 10-year risk of recurrence untreated: 29%  Treatment plan: 1.Mastectomywith targeted node dissection 2.adjuvant chemotherapy with dose dense Adriamycin and Cytoxan followed by Taxol 3.Adjuvant radiation 4.Followed by adjuvant antiestrogen therapy ------------------------------------------------------------------------------------------------------------- Bilateral mastectomies with reconstruction [name]): Right breast: no malignancy Left breast: invasive and in situ ductal carcinoma, 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 -------------------------------------------------------------------------------------------------- Current Treatment:Completed 4 cycles of dose denseAdriamycin and Cytoxan,today cycle4ofTaxol 12/30/20: ECHO EF 65-70% Labs reviewed  Chemo Toxicities: 1.Fatigue: Improvement. She thinks this is a walk in the park compared to previous chemo. 2.Nausea: Improved 3.Anemia: Hemoglobin8.8: On oral iron therapy 4.Constipation: Patient is taking more fiber in her diet and its helped her.  Upper respite tract infection: Treated with antibiotics  Return to clinic weekly for Taxol and every other week for follow-up with me.

## 2021-05-08 NOTE — Patient Instructions (Signed)
[facility] MEDICAL [facility]COLOG[facility]  Discharge Instructions: Thank you for choosing [facility] to provide your oncology and hematology care.   If you have a lab appointment with the Cancer Center, please go directly to the Cancer Center and check in at the registration area.   Wear comfortable clothing and clothing appropriate for easy access to any Portacath or PICC line.   We strive to give you quality time with your provider. You may need to reschedule your appointment if you arrive late (15 or more minutes).  Arriving late affects you and other patients whose appointments are after yours.  Also, if you miss three or more appointments without notifying the office, you may be dismissed from the clinic at the provider's discretion.      For prescription refill requests, have your pharmacy contact our office and allow 72 hours for refills to be completed.    Today you received the following chemotherapy and/or immunotherapy agent: Paclitaxel (Taxol)   To help prevent nausea and vomiting after your treatment, we encourage you to take your nausea medication as directed.  BELOW ARE SYMPTOMS THAT SHOULD BE REPORTED IMMEDIATELY: *FEVER GREATER THAN 100.4 F (38 C) OR HIGHER *CHILLS OR SWEATING *NAUSEA AND VOMITING THAT IS NOT CONTROLLED WITH YOUR NAUSEA MEDICATION *UNUSUAL SHORTNESS OF BREATH *UNUSUAL BRUISING OR BLEEDING *URINARY PROBLEMS (pain or burning when urinating, or frequent urination) *BOWEL PROBLEMS (unusual diarrhea, constipation, pain near the anus) TENDERNESS IN MOUTH AND THROAT WITH OR WITHOUT PRESENCE OF ULCERS (sore throat, sores in mouth, or a toothache) UNUSUAL RASH, SWELLING OR PAIN  UNUSUAL VAGINAL DISCHARGE OR ITCHING   Items with * indicate a potential emergency and should be followed up as soon as possible or go to the Emergency Department if any problems should occur.  Please show the CHEMOTHERAPY ALERT CARD or IMMUNOTHERAPY ALERT CARD at  check-in to the Emergency Department and triage nurse.  Should you have questions after your visit or need to cancel or reschedule your appointment, please contact [facility]  Dept: [phone]  and follow the prompts.  Office hours are 8:00 a.m. to 4:30 p.m. Monday - Friday. Please note that voicemails left after 4:00 p.m. may not be returned until the following business day.  We are closed weekends and major holidays. You have access to a nurse at all times for urgent questions. Please call the main number to the clinic Dept: [phone] and follow the prompts.   For any non-urgent questions, you may also contact your provider using MyChart. We now offer e-Visits for anyone 18 and older to request care online for non-urgent symptoms. For details visit [email or web address].   Also download the MyChart app! Go to the app store, search "MyChart", open the app, select [facility], and log in with your MyChart username and password.  Due to Covid, a mask is required upon entering the hospital/clinic. If you do not have a mask, one will be given to you upon arrival. For doctor visits, patients may have 1 support person aged 18 or older with them. For treatment visits, patients cannot have anyone with them due to current Covid guidelines and our immunocompromised population.  

## 2021-05-08 NOTE — Patient Instructions (Signed)
Implanted Port Home Guide An implanted port is a device that is placed under the skin. It is usually placed in the chest. The device can be used to give IV medicine, to take blood, or for dialysis. You may have an implanted port if: You need IV medicine that would be irritating to the small veins in your hands or arms. You need IV medicines, such as antibiotics, for a long period of time. You need IV nutrition for a long period of time. You need dialysis. When you have a port, your health care provider can choose to use the port instead of veins in your arms for these procedures. You may have fewer limitations when using a port than you would if you used other types of long-term IVs, and you will likely be able to return to normal activities afteryour incision heals. An implanted port has two main parts: Reservoir. The reservoir is the part where a needle is inserted to give medicines or draw blood. The reservoir is round. After it is placed, it appears as a small, raised area under your skin. Catheter. The catheter is a thin, flexible tube that connects the reservoir to a vein. Medicine that is inserted into the reservoir goes into the catheter and then into the vein. How is my port accessed? To access your port: A numbing cream may be placed on the skin over the port site. Your health care provider will put on a mask and sterile gloves. The skin over your port will be cleaned carefully with a germ-killing soap and allowed to dry. Your health care provider will gently pinch the port and insert a needle into it. Your health care provider will check for a blood return to make sure the port is in the vein and is not clogged. If your port needs to remain accessed to get medicine continuously (constant infusion), your health care provider will place a clear bandage (dressing) over the needle site. The dressing and needle will need to be changed every week, or as told by your health care provider. What  is flushing? Flushing helps keep the port from getting clogged. Follow instructions from your health care provider about how and when to flush the port. Ports are usually flushed with saline solution or a medicine called heparin. The need for flushing will depend on how the port is used: If the port is only used from time to time to give medicines or draw blood, the port may need to be flushed: Before and after medicines have been given. Before and after blood has been drawn. As part of routine maintenance. Flushing may be recommended every 4-6 weeks. If a constant infusion is running, the port may not need to be flushed. Throw away any syringes in a disposal container that is meant for sharp items (sharps container). You can buy a sharps container from a pharmacy, or you can make one by using an empty hard plastic bottle with a cover. How long will my port stay implanted? The port can stay in for as long as your health care provider thinks it is needed. When it is time for the port to come out, a surgery will be done to remove it. The surgery will be similar to the procedure that was done to putthe port in. Follow these instructions at home:  Flush your port as told by your health care provider. If you need an infusion over several days, follow instructions from your health care provider about how to take   care of your port site. Make sure you: Wash your hands with soap and water before you change your dressing. If soap and water are not available, use alcohol-based hand sanitizer. Change your dressing as told by your health care provider. Place any used dressings or infusion bags into a plastic bag. Throw that bag in the trash. Keep the dressing that covers the needle clean and dry. Do not get it wet. Do not use scissors or sharp objects near the tube. Keep the tube clamped, unless it is being used. Check your port site every day for signs of infection. Check for: Redness, swelling, or  pain. Fluid or blood. Pus or a bad smell. Protect the skin around the port site. Avoid wearing bra straps that rub or irritate the site. Protect the skin around your port from seat belts. Place a soft pad over your chest if needed. Bathe or shower as told by your health care provider. The site may get wet as long as you are not actively receiving an infusion. Return to your normal activities as told by your health care provider. Ask your health care provider what activities are safe for you. Carry a medical alert card or wear a medical alert bracelet at all times. This will let health care providers know that you have an implanted port in case of an emergency. Get help right away if: You have redness, swelling, or pain at the port site. You have fluid or blood coming from your port site. You have pus or a bad smell coming from the port site. You have a fever. Summary Implanted ports are usually placed in the chest for long-term IV access. Follow instructions from your health care provider about flushing the port and changing bandages (dressings). Take care of the area around your port by avoiding clothing that puts pressure on the area, and by watching for signs of infection. Protect the skin around your port from seat belts. Place a soft pad over your chest if needed. Get help right away if you have a fever or you have redness, swelling, pain, drainage, or a bad smell at the port site. This information is not intended to replace advice given to you by your health care provider. Make sure you discuss any questions you have with your healthcare provider. Document Revised: 03/11/2020 Document Reviewed: 03/11/2020 Elsevier Patient Education  2022 [employer]  

## 2021-05-09 ENCOUNTER — Telehealth: Payer: Self-pay | Admitting: Hematology and Oncology

## 2021-05-09 NOTE — Telephone Encounter (Signed)
Scheduled per 6/30 los. Pt will receive an updated appt calendar

## 2021-05-13 ENCOUNTER — Encounter: Payer: Self-pay | Admitting: Plastic Surgery

## 2021-05-13 ENCOUNTER — Other Ambulatory Visit: Payer: Self-pay

## 2021-05-13 ENCOUNTER — Ambulatory Visit (INDEPENDENT_AMBULATORY_CARE_PROVIDER_SITE_OTHER): Payer: 59 | Admitting: Plastic Surgery

## 2021-05-13 DIAGNOSIS — Z719 Counseling, unspecified: Secondary | ICD-10-CM

## 2021-05-13 DIAGNOSIS — Z9013 Acquired absence of bilateral breasts and nipples: Secondary | ICD-10-CM

## 2021-05-13 NOTE — Progress Notes (Signed)
Jennifer Davis is a 45 year old female here for follow-up removal of her right breast expander.  Her skin is doing much much better.  She is also doing better overall.  She asked about using some Latisse for her eyelashes due to the chemotherapy.  That would be fine.  I am really pleased at the healing on the right side.  She has a little bronchitis she has been dealing with so would not really do any fill today on the left breast.  I would like to see her back in a few weeks after she is healed up from the bronchitis and then we will talk about next steps.  The patient is pleased with this plan.

## 2021-05-15 ENCOUNTER — Other Ambulatory Visit: Payer: Self-pay

## 2021-05-15 ENCOUNTER — Inpatient Hospital Stay: Payer: 59 | Attending: Hematology and Oncology

## 2021-05-15 ENCOUNTER — Other Ambulatory Visit: Payer: Self-pay | Admitting: Hematology and Oncology

## 2021-05-15 ENCOUNTER — Inpatient Hospital Stay: Payer: 59

## 2021-05-15 VITALS — BP 134/79 | HR 96 | Temp 98.6°F | Resp 16 | Wt 224.0 lb

## 2021-05-15 DIAGNOSIS — Z5189 Encounter for other specified aftercare: Secondary | ICD-10-CM | POA: Diagnosis not present

## 2021-05-15 DIAGNOSIS — Z95828 Presence of other vascular implants and grafts: Secondary | ICD-10-CM

## 2021-05-15 DIAGNOSIS — Z170 Estrogen receptor positive status [ER+]: Secondary | ICD-10-CM | POA: Diagnosis not present

## 2021-05-15 DIAGNOSIS — Z79899 Other long term (current) drug therapy: Secondary | ICD-10-CM | POA: Diagnosis not present

## 2021-05-15 DIAGNOSIS — C50412 Malignant neoplasm of upper-outer quadrant of left female breast: Secondary | ICD-10-CM | POA: Insufficient documentation

## 2021-05-15 DIAGNOSIS — C773 Secondary and unspecified malignant neoplasm of axilla and upper limb lymph nodes: Secondary | ICD-10-CM | POA: Insufficient documentation

## 2021-05-15 DIAGNOSIS — Z5111 Encounter for antineoplastic chemotherapy: Secondary | ICD-10-CM | POA: Insufficient documentation

## 2021-05-15 LAB — CBC WITH DIFFERENTIAL/PLATELET
Abs Immature Granulocytes: 0.11 10*3/uL — ABNORMAL HIGH (ref 0.00–0.07)
Basophils Absolute: 0 10*3/uL (ref 0.0–0.1)
Basophils Relative: 1 %
Eosinophils Absolute: 0.2 10*3/uL (ref 0.0–0.5)
Eosinophils Relative: 2 %
HCT: 30.8 % — ABNORMAL LOW (ref 36.0–46.0)
Hemoglobin: 9.5 g/dL — ABNORMAL LOW (ref 12.0–15.0)
Immature Granulocytes: 2 %
Lymphocytes Relative: 19 %
Lymphs Abs: 1.1 10*3/uL (ref 0.7–4.0)
MCH: 27.4 pg (ref 26.0–34.0)
MCHC: 30.8 g/dL (ref 30.0–36.0)
MCV: 88.8 fL (ref 80.0–100.0)
Monocytes Absolute: 0.4 10*3/uL (ref 0.1–1.0)
Monocytes Relative: 6 %
Neutro Abs: 4.4 10*3/uL (ref 1.7–7.7)
Neutrophils Relative %: 70 %
Platelets: 372 10*3/uL (ref 150–400)
RBC: 3.47 MIL/uL — ABNORMAL LOW (ref 3.87–5.11)
RDW: 22.6 % — ABNORMAL HIGH (ref 11.5–15.5)
WBC: 6.2 10*3/uL (ref 4.0–10.5)
nRBC: 1 % — ABNORMAL HIGH (ref 0.0–0.2)

## 2021-05-15 LAB — COMPREHENSIVE METABOLIC PANEL
ALT: 78 U/L — ABNORMAL HIGH (ref 0–44)
AST: 37 U/L (ref 15–41)
Albumin: 3.4 g/dL — ABNORMAL LOW (ref 3.5–5.0)
Alkaline Phosphatase: 84 U/L (ref 38–126)
Anion gap: 11 (ref 5–15)
BUN: 8 mg/dL (ref 6–20)
CO2: 25 mmol/L (ref 22–32)
Calcium: 9.6 mg/dL (ref 8.9–10.3)
Chloride: 106 mmol/L (ref 98–111)
Creatinine, Ser: 0.79 mg/dL (ref 0.44–1.00)
GFR, Estimated: >60 mL/min (ref >60)
Glucose, Bld: 152 mg/dL — ABNORMAL HIGH (ref 70–99)
Potassium: 3.4 mmol/L — ABNORMAL LOW (ref 3.5–5.1)
Sodium: 142 mmol/L (ref 135–145)
Total Bilirubin: 0.3 mg/dL (ref 0.3–1.2)
Total Protein: 6.9 g/dL (ref 6.5–8.1)

## 2021-05-15 MED ORDER — HEPARIN SOD (PORK) LOCK FLUSH 100 UNIT/ML IV SOLN
500.0000 [IU] | Freq: Once | INTRAVENOUS | Status: AC | PRN
  Administered 2021-05-15: 500 [IU]
  Filled 2021-05-15: qty 5

## 2021-05-15 MED ORDER — SODIUM CHLORIDE 0.9 % IV SOLN
Freq: Once | INTRAVENOUS | Status: AC
Start: 2021-05-15 — End: 2021-05-15
  Filled 2021-05-15: qty 250

## 2021-05-15 MED ORDER — FAMOTIDINE 20 MG IN NS 100 ML IVPB
20.0000 mg | Freq: Once | INTRAVENOUS | Status: AC
Start: 2021-05-15 — End: 2021-05-15
  Administered 2021-05-15: 20 mg via INTRAVENOUS

## 2021-05-15 MED ORDER — FAMOTIDINE 20 MG IN NS 100 ML IVPB
INTRAVENOUS | Status: AC
  Filled 2021-05-15: qty 100

## 2021-05-15 MED ORDER — SODIUM CHLORIDE 0.9 % IV SOLN
10.0000 mg | Freq: Once | INTRAVENOUS | Status: AC
  Administered 2021-05-15: 10 mg via INTRAVENOUS
  Filled 2021-05-15: qty 10

## 2021-05-15 MED ORDER — SODIUM CHLORIDE 0.9% FLUSH
10.0000 mL | Freq: Once | INTRAVENOUS | Status: AC
  Administered 2021-05-15: 10 mL
  Filled 2021-05-15: qty 10

## 2021-05-15 MED ORDER — PACLITAXEL CHEMO INJECTION 300 MG/50ML
65.0000 mg/m2 | Freq: Once | INTRAVENOUS | Status: AC
Start: 2021-05-15 — End: 2021-05-15
  Administered 2021-05-15: 126 mg via INTRAVENOUS
  Filled 2021-05-15: qty 21

## 2021-05-15 MED ORDER — SODIUM CHLORIDE 0.9 % IV SOLN
80.0000 mg/m2 | Freq: Once | INTRAVENOUS | Status: DC
  Filled 2021-05-15: qty 25

## 2021-05-15 MED ORDER — DIPHENHYDRAMINE HCL 50 MG/ML IJ SOLN
INTRAMUSCULAR | Status: AC
  Filled 2021-05-15: qty 1

## 2021-05-15 MED ORDER — SODIUM CHLORIDE 0.9% FLUSH
10.0000 mL | INTRAVENOUS | Status: DC | PRN
  Administered 2021-05-15: 10 mL
  Filled 2021-05-15: qty 10

## 2021-05-15 MED ORDER — DIPHENHYDRAMINE HCL 50 MG/ML IJ SOLN
25.0000 mg | Freq: Once | INTRAMUSCULAR | Status: AC
Start: 2021-05-15 — End: 2021-05-15
  Administered 2021-05-15: 25 mg via INTRAVENOUS

## 2021-05-15 NOTE — Patient Instructions (Signed)
[facility]COL[facility]  Discharge Instructions: Thank you for choosing [facility] to provide your oncology and hematology care.   If you have a lab appointment with the Cancer Center, please go directly to the Cancer Center and check in at the registration area.   Wear comfortable clothing and clothing appropriate for easy access to any Portacath or PICC line.   We strive to give you quality time with your provider. You may need to reschedule your appointment if you arrive late (15 or more minutes).  Arriving late affects you and other patients whose appointments are after yours.  Also, if you miss three or more appointments without notifying the office, you may be dismissed from the clinic at the provider's discretion.      For prescription refill requests, have your pharmacy contact our office and allow 72 hours for refills to be completed.    Today you received the following chemotherapy and/or immunotherapy agent: Paclitaxel (Taxol)   To help prevent nausea and vomiting after your treatment, we encourage you to take your nausea medication as directed.  BELOW ARE SYMPTOMS THAT SHOULD BE REPORTED IMMEDIATELY: *FEVER GREATER THAN 100.4 F (38 C) OR HIGHER *CHILLS OR SWEATING *NAUSEA AND VOMITING THAT IS NOT CONTROLLED WITH YOUR NAUSEA MEDICATION *UNUSUAL SHORTNESS OF BREATH *UNUSUAL BRUISING OR BLEEDING *URINARY PROBLEMS (pain or burning when urinating, or frequent urination) *BOWEL PROBLEMS (unusual diarrhea, constipation, pain near the anus) TENDERNESS IN MOUTH AND THROAT WITH OR WITHOUT PRESENCE OF ULCERS (sore throat, sores in mouth, or a toothache) UNUSUAL RASH, SWELLING OR PAIN  UNUSUAL VAGINAL DISCHARGE OR ITCHING   Items with * indicate a potential emergency and should be followed up as soon as possible or go to the Emergency Department if any problems should occur.  Please show the CHEMOTHERAPY ALERT CARD or IMMUNOTHERAPY ALERT CARD at  check-in to the Emergency Department and triage nurse.  Should you have questions after your visit or need to cancel or reschedule your appointment, please contact [facility]  Dept: [phone]  and follow the prompts.  Office hours are 8:00 a.m. to 4:30 p.m. Monday - Friday. Please note that voicemails left after 4:00 p.m. may not be returned until the following business day.  We are closed weekends and major holidays. You have access to a nurse at all times for urgent questions. Please call the main number to the clinic Dept: [phone] and follow the prompts.   For any non-urgent questions, you may also contact your provider using MyChart. We now offer e-Visits for anyone 18 and older to request care online for non-urgent symptoms. For details visit [email or web address].   Also download the MyChart app! Go to the app store, search "MyChart", open the app, select [facility], and log in with your MyChart username and password.  Due to Covid, a mask is required upon entering the hospital/clinic. If you do not have a mask, one will be given to you upon arrival. For doctor visits, patients may have 1 support person aged 18 or older with them. For treatment visits, patients cannot have anyone with them due to current Covid guidelines and our immunocompromised population.  

## 2021-05-21 ENCOUNTER — Encounter: Payer: Self-pay | Admitting: Hematology and Oncology

## 2021-05-21 ENCOUNTER — Other Ambulatory Visit: Payer: Self-pay | Admitting: Physician Assistant

## 2021-05-21 ENCOUNTER — Other Ambulatory Visit: Payer: Self-pay

## 2021-05-21 ENCOUNTER — Other Ambulatory Visit (HOSPITAL_COMMUNITY): Payer: Self-pay

## 2021-05-21 MED ORDER — LEVOFLOXACIN 500 MG PO TABS
500.0000 mg | ORAL_TABLET | Freq: Every day | ORAL | 0 refills | Status: DC
  Filled 2021-05-21: qty 7, 7d supply, fill #0

## 2021-05-21 MED FILL — Buspirone HCl Tab 30 MG: ORAL | 30 days supply | Qty: 60 | Fill #0 | Status: AC

## 2021-05-21 MED FILL — Budesonide-Formoterol Fumarate Dihyd Aerosol 160-4.5 MCG/ACT: RESPIRATORY_TRACT | 30 days supply | Qty: 10.2 | Fill #1 | Status: AC

## 2021-05-21 NOTE — Progress Notes (Signed)
Patient Care Team: Wenda Low, MD as PCP - General (Internal Medicine) Rockwell Germany, RN as Oncology Nurse Navigator Mauro Kaufmann, RN as Oncology Nurse Navigator Coralie Keens, MD as Consulting Physician (General Surgery) Nicholas Lose, MD as Consulting Physician (Hematology and Oncology) Gery Pray, MD as Consulting Physician (Radiation Oncology) Gwyndolyn Kaufman, RN (Inactive) as Registered Nurse  DIAGNOSIS:    ICD-10-CM   1. Malignant neoplasm of upper-outer quadrant of left breast in female, estrogen receptor positive ([place])  C50.412    Z17.0       SUMMARY OF ONCOLOGIC HISTORY: Oncology History  Malignant neoplasm of upper-outer quadrant of left breast in female, estrogen receptor positive ([place])  09/19/2020 Initial Diagnosis   Mammogram in 07/2018 showed probably benign bilateral breast masses that were stable on her last mammogram on 07/26/19. Mammogram on 08/28/20 showed the stable bilateral masses, a new 0.9cm mass at the 1 o'clock position in the left breast, and one abnormal left axillary lymph node with 0.6cm cortical thickening. 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    10/02/2020 Genetic Testing   Negative genetic testing: no pathogenic variants detected in [facility] Common-Hereditary Cancers Panel.  Variants of uncertain significance detected in APC c.6918T>A (p.Asp2306Glu) and POLE  c.2612G>C (p.Ser871Thr).  The report date is November 24. 2021.   The Common Hereditary Cancers Panel offered by [facility] includes sequencing and/or deletion duplication testing of the following 48 genes: APC, ATM, AXIN2, BARD1, BMPR1A, BRCA1, BRCA2, BRIP1, CDH1, CDK4, CDKN2A (p14ARF), CDKN2A (p16INK4a), CHEK2, CTNNA1, DICER1, EPCAM (Deletion/duplication testing only), GREM1 (promoter region deletion/duplication testing only), KIT, MEN1, MLH1, MSH2, MSH3, MSH6, MUTYH, NBN, NF1,  NHTL1, PALB2, PDGFRA, PMS2, POLD1, POLE, PTEN, RAD50, RAD51C, RAD51D, RNF43, SDHB, SDHC, SDHD, SMAD4, SMARCA4. STK11, TP53, TSC1, TSC2, and VHL.  The following genes were evaluated for sequence changes only: SDHA and HOXB13 c.251G>A variant only.   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    12/04/2020 Cancer Staging   Staging form: Breast, AJCC 8th Edition - Pathologic stage from 12/04/2020: Stage IA (pT1c, pN1a, cM0, G2, ER+, PR+, HER2-) - Signed by [name], NP on 12/18/2020  Histologic grading system: 3 grade system    01/22/2021 -  Chemotherapy    Patient is on Treatment Plan: BREAST ADJUVANT DOSE DENSE AC Q14D / PACLITAXEL Q7D         CHIEF COMPLIANT: Cycle 8 Taxol  INTERVAL HISTORY: Jennifer Davis is a 45 y.o. with above-mentioned history of  left breast cancer who underwent bilateral mastectomies with reconstruction and is currently on adjuvant chemotherapy with weekly Taxol after completing 4 cycles of Adriamycin and Cytoxan. She presents to the clinic today for a toxicity check and cycle 8.  Her major complaints are related to sore throat and upper respiratory infection.  She finished azithromycin did not get much relief.  She started on Levaquin.  She is starting to feel better.  Denies any fevers or chills.  She also continues to have neuropathy in the tips of the index and the thumb fingers.  She has Neurontin at home and would like to take it for neuropathy.  ALLERGIES:  is allergic to bee venom and other.  MEDICATIONS:  Current Outpatient Medications  Medication  Sig Dispense Refill   acetaminophen (TYLENOL) 500 MG tablet TAKE 1 TABLET (500 MG TOTAL) BY MOUTH EVERY 6 (SIX) HOURS AS NEEDED. FOR USE AFTER SURGERY (Patient taking differently: Take 500 mg by mouth every 6  (six) hours as needed for mild pain, fever or headache.) 30 tablet 0   budesonide-formoterol (SYMBICORT) 160-4.5 MCG/ACT inhaler INHALE 2 PUFFS BY MOUTH 2 TIMES A DAY 10.2 g 5   busPIRone (BUSPAR) 30 MG tablet TAKE 1 TABLET (30 MG TOTAL) BY MOUTH 2 (TWO) TIMES DAILY. 60 tablet 5   cetirizine (ZYRTEC) 10 MG tablet Take 10 mg by mouth daily.     diazepam (VALIUM) 2 MG tablet TAKE 1 TABLET (2 MG TOTAL) BY MOUTH EVERY 12 (TWELVE) HOURS AS NEEDED FOR UP TO 15 DAYS FOR MUSCLE SPASMS. 30 tablet 0   DULoxetine (CYMBALTA) 60 MG capsule TAKE 2 CAPSULES (120 MG TOTAL) BY MOUTH DAILY. 60 capsule 5   EPINEPHrine (EPIPEN 2-PAK) 0.3 mg/0.3 mL IJ SOAJ injection Inject 0.3 mLs (0.3 mg total) into the muscle once as needed for up to 1 dose (for severe allergic reaction). CAll 911 immediately if you have to use this medicine 1 each 1   gabapentin (NEURONTIN) 100 MG capsule Take 1 capsule (100 mg total) by mouth 3 (three) times daily as needed. (Patient taking differently: Take 100 mg by mouth 3 (three) times daily as needed (neuropathy).) 90 capsule 1   ibuprofen (ADVIL) 600 MG tablet TAKE 1 TABLET (600 MG TOTAL) BY MOUTH EVERY 6 (SIX) HOURS AS NEEDED FOR MILD PAIN OR MODERATE PAIN. FOR USE AFTER SURGERY 30 tablet 0   levofloxacin (LEVAQUIN) 500 MG tablet Take 1 tablet (500 mg total) by mouth daily. 7 tablet 0   lidocaine-prilocaine (EMLA) cream APPLY TO AFFECTED AREA ONCE 30 g 3   loratadine (CLARITIN) 10 MG tablet Take 10 mg by mouth daily.     LORazepam (ATIVAN) 0.5 MG tablet Take 0.5 mg by mouth at bedtime as needed (nausea).     montelukast (SINGULAIR) 10 MG tablet Take 10 mg by mouth at bedtime.  5   Multiple Vitamin (MULTIVITAMIN WITH MINERALS) TABS tablet Take 1 tablet by mouth daily.     Multiple Vitamins-Minerals (MULTIVITAMIN PO) Take 2 tablets by mouth daily. Skin/hair/nail supplement.     ondansetron (ZOFRAN-ODT) 4 MG disintegrating tablet TAKE 1 TABLET (4 MG TOTAL) BY MOUTH EVERY 8 (EIGHT) HOURS AS NEEDED  FOR NAUSEA OR VOMITING. (Patient not taking: Reported on 04/22/2021) 20 tablet 0   PROAIR HFA 108 (90 Base) MCG/ACT inhaler Inhale 2 puffs into the lungs as needed for wheezing or shortness of breath.  0   prochlorperazine (COMPAZINE) 10 MG tablet TAKE 1 TABLET (10 MG TOTAL) BY MOUTH EVERY 6 (SIX) HOURS AS NEEDED (NAUSEA OR VOMITING). 30 tablet 1   No current facility-administered medications for this visit.    PHYSICAL EXAMINATION: ECOG PERFORMANCE STATUS: 1 - Symptomatic but completely ambulatory  Vitals:   05/22/21 1117  BP: (!) 147/74  Pulse: (!) 112  Resp: 19  Temp: 97.6 F (36.4 C)  SpO2: 100%   Filed Weights   05/22/21 1117  Weight: 223 lb 9.6 oz (101.4 kg)    LABORATORY DATA:  I have reviewed the data as listed CMP Latest Ref Rng & Units 05/15/2021 05/08/2021 05/01/2021  Glucose 70 - 99 mg/dL 152(H) 126(H) 119(H)  BUN 6 - 20 mg/dL '8 9 17  ' Creatinine 0.44 - 1.00 mg/dL 0.79 0.72 0.75  Sodium 135 - 145 mmol/L  142 140 141  Potassium 3.5 - 5.1 mmol/L 3.4(L) 3.6 3.5  Chloride 98 - 111 mmol/L 106 108 104  CO2 22 - 32 mmol/L '25 25 26  ' Calcium 8.9 - 10.3 mg/dL 9.6 8.7(L) 9.1  Total Protein 6.5 - 8.1 g/dL 6.9 6.9 6.8  Total Bilirubin 0.3 - 1.2 mg/dL 0.3 0.3 0.3  Alkaline Phos 38 - 126 U/L 84 72 69  AST 15 - 41 U/L 37 35 16  ALT 0 - 44 U/L 78(H) 44 29    Lab Results  Component Value Date   WBC 6.2 05/15/2021   HGB 9.5 (L) 05/15/2021   HCT 30.8 (L) 05/15/2021   MCV 88.8 05/15/2021   PLT 372 05/15/2021   NEUTROABS 4.4 05/15/2021    ASSESSMENT & PLAN:  Malignant neoplasm of upper-outer quadrant of left breast in female, estrogen receptor positive ([place]) 09/20/2019: Screening mammogram on 08/28/20 showed the stable bilateral masses, a new 0.9cm mass at the 1 o'clock position in the left breast, and one abnormal left axillary lymph node with 0.6cm cortical thickening. Biopsy showed invasive and in situ ductal carcinoma in the breast and axilla, grade 2, HER-2 equivocal by IHC  (2+), ER+ 90%, PR+ 60%, Ki67 25%.  MammaPrint: High risk: Luminal type B, 5-year metastasis free survival with chemo and hormone therapy: 93%, absolute benefit from chemo greater than 12%, average 10-year risk of recurrence untreated: 29%   Treatment plan: 1.  Mastectomy with targeted node dissection 2.  adjuvant chemotherapy with dose dense Adriamycin and Cytoxan followed by Taxol 3.  Adjuvant radiation 4.  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  -------------------------------------------------------------------------------------------------- Current Treatment: Completed 4 cycles of dose dense Adriamycin and Cytoxan,  today cycle 8 of Taxol 12/30/20: ECHO EF 65-70% Labs reviewed   Chemo Toxicities: 1.  Fatigue: Improvement.  She thinks this is a walk in the park compared to previous chemo. 2. Nausea: Improved 3.  Anemia: Hemoglobin 9.8: On oral iron therapy, slowly recovering 4.  Constipation: Patient is taking more fiber in her diet and its helped her. 5.  Upper respiratory infection: We started on second course of antibiotics with Levaquin she thinks is getting better.. 6.  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    No orders of the defined types were placed in this encounter.  The patient has a good understanding of the overall plan. she agrees with it. she will call with any problems that may develop before the next visit here.  Total time spent: 30 mins including face to face time and time spent for planning, charting and  coordination of care  Rulon Eisenmenger, MD, MPH 05/22/2021  I, Thana Ates, am acting as scribe for Dr. Nicholas Lose.  I have reviewed the above documentation for accuracy and completeness, and I agree with the above.

## 2021-05-22 ENCOUNTER — Other Ambulatory Visit (HOSPITAL_COMMUNITY): Payer: Self-pay

## 2021-05-22 ENCOUNTER — Other Ambulatory Visit: Payer: Self-pay

## 2021-05-22 ENCOUNTER — Encounter: Payer: Self-pay | Admitting: *Deleted

## 2021-05-22 ENCOUNTER — Inpatient Hospital Stay: Payer: 59

## 2021-05-22 ENCOUNTER — Inpatient Hospital Stay (HOSPITAL_BASED_OUTPATIENT_CLINIC_OR_DEPARTMENT_OTHER): Payer: 59 | Admitting: Hematology and Oncology

## 2021-05-22 VITALS — HR 104

## 2021-05-22 DIAGNOSIS — Z170 Estrogen receptor positive status [ER+]: Secondary | ICD-10-CM

## 2021-05-22 DIAGNOSIS — C50412 Malignant neoplasm of upper-outer quadrant of left female breast: Secondary | ICD-10-CM

## 2021-05-22 DIAGNOSIS — Z79899 Other long term (current) drug therapy: Secondary | ICD-10-CM | POA: Diagnosis not present

## 2021-05-22 DIAGNOSIS — C773 Secondary and unspecified malignant neoplasm of axilla and upper limb lymph nodes: Secondary | ICD-10-CM | POA: Diagnosis not present

## 2021-05-22 DIAGNOSIS — Z95828 Presence of other vascular implants and grafts: Secondary | ICD-10-CM

## 2021-05-22 DIAGNOSIS — Z5111 Encounter for antineoplastic chemotherapy: Secondary | ICD-10-CM | POA: Diagnosis not present

## 2021-05-22 DIAGNOSIS — Z5189 Encounter for other specified aftercare: Secondary | ICD-10-CM | POA: Diagnosis not present

## 2021-05-22 LAB — COMPREHENSIVE METABOLIC PANEL
ALT: 63 U/L — ABNORMAL HIGH (ref 0–44)
AST: 47 U/L — ABNORMAL HIGH (ref 15–41)
Albumin: 3.4 g/dL — ABNORMAL LOW (ref 3.5–5.0)
Alkaline Phosphatase: 82 U/L (ref 38–126)
Anion gap: 9 (ref 5–15)
BUN: 8 mg/dL (ref 6–20)
CO2: 25 mmol/L (ref 22–32)
Calcium: 9.1 mg/dL (ref 8.9–10.3)
Chloride: 106 mmol/L (ref 98–111)
Creatinine, Ser: 0.79 mg/dL (ref 0.44–1.00)
GFR, Estimated: >60 mL/min (ref >60)
Glucose, Bld: 136 mg/dL — ABNORMAL HIGH (ref 70–99)
Potassium: 3.7 mmol/L (ref 3.5–5.1)
Sodium: 140 mmol/L (ref 135–145)
Total Bilirubin: 0.3 mg/dL (ref 0.3–1.2)
Total Protein: 7 g/dL (ref 6.5–8.1)

## 2021-05-22 LAB — CBC WITH DIFFERENTIAL/PLATELET
Abs Immature Granulocytes: 0.2 10*3/uL — ABNORMAL HIGH (ref 0.00–0.07)
Basophils Absolute: 0 10*3/uL (ref 0.0–0.1)
Basophils Relative: 1 %
Eosinophils Absolute: 0.2 10*3/uL (ref 0.0–0.5)
Eosinophils Relative: 4 %
HCT: 32 % — ABNORMAL LOW (ref 36.0–46.0)
Hemoglobin: 9.8 g/dL — ABNORMAL LOW (ref 12.0–15.0)
Immature Granulocytes: 3 %
Lymphocytes Relative: 20 %
Lymphs Abs: 1.2 10*3/uL (ref 0.7–4.0)
MCH: 27.6 pg (ref 26.0–34.0)
MCHC: 30.6 g/dL (ref 30.0–36.0)
MCV: 90.1 fL (ref 80.0–100.0)
Monocytes Absolute: 0.4 10*3/uL (ref 0.1–1.0)
Monocytes Relative: 7 %
Neutro Abs: 3.9 10*3/uL (ref 1.7–7.7)
Neutrophils Relative %: 65 %
Platelets: 465 10*3/uL — ABNORMAL HIGH (ref 150–400)
RBC: 3.55 MIL/uL — ABNORMAL LOW (ref 3.87–5.11)
RDW: 22.1 % — ABNORMAL HIGH (ref 11.5–15.5)
WBC: 5.9 10*3/uL (ref 4.0–10.5)
nRBC: 2 % — ABNORMAL HIGH (ref 0.0–0.2)

## 2021-05-22 MED ORDER — SODIUM CHLORIDE 0.9% FLUSH
10.0000 mL | Freq: Once | INTRAVENOUS | Status: AC
  Administered 2021-05-22: 10 mL
  Filled 2021-05-22: qty 10

## 2021-05-22 MED ORDER — FAMOTIDINE 20 MG IN NS 100 ML IVPB
20.0000 mg | Freq: Once | INTRAVENOUS | Status: AC
  Administered 2021-05-22: 20 mg via INTRAVENOUS

## 2021-05-22 MED ORDER — FAMOTIDINE 20 MG IN NS 100 ML IVPB
INTRAVENOUS | Status: AC
  Filled 2021-05-22: qty 100

## 2021-05-22 MED ORDER — SODIUM CHLORIDE 0.9 % IV SOLN
10.0000 mg | Freq: Once | INTRAVENOUS | Status: AC
  Administered 2021-05-22: 10 mg via INTRAVENOUS
  Filled 2021-05-22: qty 10

## 2021-05-22 MED ORDER — HEPARIN SOD (PORK) LOCK FLUSH 100 UNIT/ML IV SOLN
500.0000 [IU] | Freq: Once | INTRAVENOUS | Status: AC | PRN
  Administered 2021-05-22: 500 [IU]
  Filled 2021-05-22: qty 5

## 2021-05-22 MED ORDER — DIPHENHYDRAMINE HCL 50 MG/ML IJ SOLN
INTRAMUSCULAR | Status: AC
  Filled 2021-05-22: qty 1

## 2021-05-22 MED ORDER — DIPHENHYDRAMINE HCL 50 MG/ML IJ SOLN
25.0000 mg | Freq: Once | INTRAMUSCULAR | Status: AC
  Administered 2021-05-22: 25 mg via INTRAVENOUS

## 2021-05-22 MED ORDER — SODIUM CHLORIDE 0.9 % IV SOLN
Freq: Once | INTRAVENOUS | Status: AC
  Filled 2021-05-22: qty 250

## 2021-05-22 MED ORDER — SODIUM CHLORIDE 0.9% FLUSH
10.0000 mL | INTRAVENOUS | Status: DC | PRN
Start: 2021-05-22 — End: 2021-05-22
  Administered 2021-05-22: 10 mL
  Filled 2021-05-22: qty 10

## 2021-05-22 MED ORDER — SODIUM CHLORIDE 0.9 % IV SOLN
65.0000 mg/m2 | Freq: Once | INTRAVENOUS | Status: AC
  Administered 2021-05-22: 126 mg via INTRAVENOUS
  Filled 2021-05-22: qty 21

## 2021-05-22 NOTE — Progress Notes (Signed)
OK to treat with HR of 103 per MD. Leanne Chang, RN aware.

## 2021-05-22 NOTE — Assessment & Plan Note (Signed)
09/20/2019: Screening mammogram on 08/28/20 showed the stable bilateral masses, a new 0.9cm mass at the 1 o'clock position in the left breast, and one abnormal left axillary lymph node with 0.6cm cortical thickening. 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 MammaPrint: High risk: Luminal type B, 5-year metastasis free survival with chemo and hormone therapy: 93%, absolute benefit from chemo greater than 12%, average 10-year risk of recurrence untreated: 29%  Treatment plan: 1.Mastectomywith targeted node dissection 2.adjuvant chemotherapy with dose dense Adriamycin and Cytoxan followed by Taxol 3.Adjuvant radiation 4.Followed by adjuvant antiestrogen therapy ------------------------------------------------------------------------------------------------------------- Bilateral mastectomies with reconstruction [name]): Right breast: no malignancy Left breast: invasive and in situ ductal carcinoma, 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 -------------------------------------------------------------------------------------------------- Current Treatment:Completed 4 cycles of dose denseAdriamycin and Cytoxan,today cycle8ofTaxol 12/30/20: ECHO EF 65-70% Labs reviewed  Chemo Toxicities: 1.Fatigue: Improvement. She thinks this is a walk in the park compared to previous chemo. 2.Nausea: Improved 3.Anemia: Hemoglobin9: On oral iron therapy 4.Constipation: Patient is taking more fiber in her diet and its helped her. 5.  Upper respiratory infection: Slowly improving.  She is done with antibiotics and prednisone.   Return to clinic weekly for Taxol and every other week for follow-up with me.

## 2021-05-23 ENCOUNTER — Other Ambulatory Visit (HOSPITAL_COMMUNITY): Payer: Self-pay

## 2021-05-23 ENCOUNTER — Other Ambulatory Visit: Payer: Self-pay

## 2021-05-23 MED ORDER — DULOXETINE HCL 60 MG PO CPEP
ORAL_CAPSULE | ORAL | 0 refills | Status: DC
  Filled 2021-05-23: qty 60, fill #0

## 2021-05-23 MED ORDER — DULOXETINE HCL 60 MG PO CPEP
120.0000 mg | ORAL_CAPSULE | Freq: Every day | ORAL | 0 refills | Status: DC
  Filled 2021-05-23: qty 60, 30d supply, fill #0

## 2021-05-26 ENCOUNTER — Telehealth: Payer: Self-pay | Admitting: Hematology and Oncology

## 2021-05-26 ENCOUNTER — Other Ambulatory Visit (HOSPITAL_COMMUNITY): Payer: Self-pay

## 2021-05-26 NOTE — Telephone Encounter (Signed)
Scheduled per 7/14 los. Pt will receive an updated appt calendar per next visit

## 2021-05-28 ENCOUNTER — Telehealth: Payer: Self-pay | Admitting: Plastic Surgery

## 2021-05-28 NOTE — Telephone Encounter (Signed)
Called and spoke with the patient regarding the message below.  Patient stated her main concern is that the (R) breast is very hard and there has been a big change with the (R) breast from the weekend to [facility] or Tues.  Patient stated that she is been treated for Bronchitis.  She was placed on antibiotics (Levaquin), which she will finish today.  Asked the patient if she was having any drainage, fever,chills,nausea,or vomiting.  Patient stated she's not having any of the above symptoms except some nausea.    Asked the patient if she's having any pain.  She stated maybe a level 3, it's just very uncomfortable.    Patient stated that her next appointment is on (06/03/21) with Dr. Marla Roe, and she wanted to make sure she didn't need to be seen sooner.    Informed the patient that I will reach out to [facility], and inform him of what going on with her, and give her a call back as soon as I can.  Patient verbalized understanding and agreed.//AB/CMA

## 2021-05-28 NOTE — Telephone Encounter (Signed)
Patient is experiencing hardness at the bottom of her right breast. Per patient, it is painful and purple/ red in color. Recent sx 6/2. Please call to advise [phone]. Thanks.

## 2021-05-28 NOTE — Telephone Encounter (Addendum)
Called the patient back and informed her that I spoke with [facility], and he would like for her to send a picture to him through [facility].  Patient verified understanding and agreed.//AB/CMA

## 2021-05-29 ENCOUNTER — Other Ambulatory Visit: Payer: Self-pay

## 2021-05-29 ENCOUNTER — Inpatient Hospital Stay: Payer: 59

## 2021-05-29 VITALS — BP 123/82 | HR 107 | Temp 99.2°F | Resp 18

## 2021-05-29 DIAGNOSIS — C50412 Malignant neoplasm of upper-outer quadrant of left female breast: Secondary | ICD-10-CM | POA: Diagnosis not present

## 2021-05-29 DIAGNOSIS — Z79899 Other long term (current) drug therapy: Secondary | ICD-10-CM | POA: Diagnosis not present

## 2021-05-29 DIAGNOSIS — Z170 Estrogen receptor positive status [ER+]: Secondary | ICD-10-CM

## 2021-05-29 DIAGNOSIS — Z95828 Presence of other vascular implants and grafts: Secondary | ICD-10-CM

## 2021-05-29 DIAGNOSIS — C773 Secondary and unspecified malignant neoplasm of axilla and upper limb lymph nodes: Secondary | ICD-10-CM | POA: Diagnosis not present

## 2021-05-29 DIAGNOSIS — Z5111 Encounter for antineoplastic chemotherapy: Secondary | ICD-10-CM | POA: Diagnosis not present

## 2021-05-29 DIAGNOSIS — Z5189 Encounter for other specified aftercare: Secondary | ICD-10-CM | POA: Diagnosis not present

## 2021-05-29 LAB — CBC WITH DIFFERENTIAL/PLATELET
Abs Immature Granulocytes: 0.18 10*3/uL — ABNORMAL HIGH (ref 0.00–0.07)
Basophils Absolute: 0 10*3/uL (ref 0.0–0.1)
Basophils Relative: 0 %
Eosinophils Absolute: 0.2 10*3/uL (ref 0.0–0.5)
Eosinophils Relative: 2 %
HCT: 29.4 % — ABNORMAL LOW (ref 36.0–46.0)
Hemoglobin: 9.1 g/dL — ABNORMAL LOW (ref 12.0–15.0)
Immature Granulocytes: 3 %
Lymphocytes Relative: 13 %
Lymphs Abs: 1 10*3/uL (ref 0.7–4.0)
MCH: 27.9 pg (ref 26.0–34.0)
MCHC: 31 g/dL (ref 30.0–36.0)
MCV: 90.2 fL (ref 80.0–100.0)
Monocytes Absolute: 0.5 10*3/uL (ref 0.1–1.0)
Monocytes Relative: 7 %
Neutro Abs: 5.5 10*3/uL (ref 1.7–7.7)
Neutrophils Relative %: 75 %
Platelets: 495 10*3/uL — ABNORMAL HIGH (ref 150–400)
RBC: 3.26 MIL/uL — ABNORMAL LOW (ref 3.87–5.11)
RDW: 21.5 % — ABNORMAL HIGH (ref 11.5–15.5)
WBC: 7.3 10*3/uL (ref 4.0–10.5)
nRBC: 0.4 % — ABNORMAL HIGH (ref 0.0–0.2)

## 2021-05-29 LAB — CMP (CANCER CENTER ONLY)
ALT: 36 U/L (ref 0–44)
AST: 29 U/L (ref 15–41)
Albumin: 3.4 g/dL — ABNORMAL LOW (ref 3.5–5.0)
Alkaline Phosphatase: 77 U/L (ref 38–126)
Anion gap: 7 (ref 5–15)
BUN: 7 mg/dL (ref 6–20)
CO2: 25 mmol/L (ref 22–32)
Calcium: 8.8 mg/dL — ABNORMAL LOW (ref 8.9–10.3)
Chloride: 106 mmol/L (ref 98–111)
Creatinine: 0.73 mg/dL (ref 0.44–1.00)
GFR, Estimated: >60 mL/min (ref >60)
Glucose, Bld: 157 mg/dL — ABNORMAL HIGH (ref 70–99)
Potassium: 3.7 mmol/L (ref 3.5–5.1)
Sodium: 138 mmol/L (ref 135–145)
Total Bilirubin: 0.4 mg/dL (ref 0.3–1.2)
Total Protein: 6.9 g/dL (ref 6.5–8.1)

## 2021-05-29 MED ORDER — SODIUM CHLORIDE 0.9 % IV SOLN
10.0000 mg | Freq: Once | INTRAVENOUS | Status: AC
  Administered 2021-05-29: 10 mg via INTRAVENOUS
  Filled 2021-05-29: qty 10

## 2021-05-29 MED ORDER — SODIUM CHLORIDE 0.9% FLUSH
10.0000 mL | INTRAVENOUS | Status: DC | PRN
Start: 2021-05-29 — End: 2021-05-29
  Administered 2021-05-29: 10 mL
  Filled 2021-05-29: qty 10

## 2021-05-29 MED ORDER — ALTEPLASE 2 MG IJ SOLR
2.0000 mg | Freq: Once | INTRAMUSCULAR | Status: AC
  Administered 2021-05-29: 2 mg
  Filled 2021-05-29: qty 2

## 2021-05-29 MED ORDER — FAMOTIDINE 20 MG IN NS 100 ML IVPB
20.0000 mg | Freq: Once | INTRAVENOUS | Status: AC
  Administered 2021-05-29: 20 mg via INTRAVENOUS

## 2021-05-29 MED ORDER — DIPHENHYDRAMINE HCL 50 MG/ML IJ SOLN
25.0000 mg | Freq: Once | INTRAMUSCULAR | Status: AC
  Administered 2021-05-29: 25 mg via INTRAVENOUS

## 2021-05-29 MED ORDER — SODIUM CHLORIDE 0.9 % IV SOLN
65.0000 mg/m2 | Freq: Once | INTRAVENOUS | Status: AC
  Administered 2021-05-29: 126 mg via INTRAVENOUS
  Filled 2021-05-29: qty 21

## 2021-05-29 MED ORDER — SODIUM CHLORIDE 0.9 % IV SOLN
Freq: Once | INTRAVENOUS | Status: AC
  Filled 2021-05-29: qty 250

## 2021-05-29 MED ORDER — FAMOTIDINE 20 MG IN NS 100 ML IVPB
INTRAVENOUS | Status: AC
  Filled 2021-05-29: qty 100

## 2021-05-29 MED ORDER — DIPHENHYDRAMINE HCL 50 MG/ML IJ SOLN
INTRAMUSCULAR | Status: AC
  Filled 2021-05-29: qty 1

## 2021-05-29 MED ORDER — ALTEPLASE 2 MG IJ SOLR
INTRAMUSCULAR | Status: AC
  Filled 2021-05-29: qty 2

## 2021-05-29 MED ORDER — HEPARIN SOD (PORK) LOCK FLUSH 100 UNIT/ML IV SOLN
500.0000 [IU] | Freq: Once | INTRAVENOUS | Status: AC | PRN
  Administered 2021-05-29: 500 [IU]
  Filled 2021-05-29: qty 5

## 2021-05-29 MED ORDER — SODIUM CHLORIDE 0.9% FLUSH
10.0000 mL | Freq: Once | INTRAVENOUS | Status: AC
  Administered 2021-05-29: 10 mL
  Filled 2021-05-29: qty 10

## 2021-05-29 NOTE — Patient Instructions (Signed)
[facility] ONCOLOG[facility]  Discharge Instructions: Thank you for choosing [facility] to provide your oncology and hematology care.   If you have a lab appointment with the [facility], please go directly to the [facility] and check in at the registration area.   Wear comfortable clothing and clothing appropriate for easy access to any Portacath or PICC line.   We strive to give you quality time with your provider. You may need to reschedule your appointment if you arrive late (15 or more minutes).  Arriving late affects you and other patients whose appointments are after yours.  Also, if you miss three or more appointments without notifying the office, you may be dismissed from the clinic at the provider's discretion.      For prescription refill requests, have your pharmacy contact our office and allow 72 hours for refills to be completed.    Today you received the following chemotherapy and/or immunotherapy agents : Taxol    To help prevent nausea and vomiting after your treatment, we encourage you to take your nausea medication as directed.  BELOW ARE SYMPTOMS THAT SHOULD BE REPORTED IMMEDIATELY: *FEVER GREATER THAN 100.4 F (38 C) OR HIGHER *CHILLS OR SWEATING *NAUSEA AND VOMITING THAT IS NOT CONTROLLED WITH YOUR NAUSEA MEDICATION *UNUSUAL SHORTNESS OF BREATH *UNUSUAL BRUISING OR BLEEDING *URINARY PROBLEMS (pain or burning when urinating, or frequent urination) *BOWEL PROBLEMS (unusual diarrhea, constipation, pain near the anus) TENDERNESS IN MOUTH AND THROAT WITH OR WITHOUT PRESENCE OF ULCERS (sore throat, sores in mouth, or a toothache) UNUSUAL RASH, SWELLING OR PAIN  UNUSUAL VAGINAL DISCHARGE OR ITCHING   Items with * indicate a potential emergency and should be followed up as soon as possible or go to the Emergency Department if any problems should occur.  Please show the CHEMOTHERAPY ALERT CARD or IMMUNOTHERAPY ALERT CARD at check-in to the  Emergency Department and triage nurse.  Should you have questions after your visit or need to cancel or reschedule your appointment, please contact [facility]  Dept: [phone]  and follow the prompts.  Office hours are 8:00 a.m. to 4:30 p.m. Monday - Friday. Please note that voicemails left after 4:00 p.m. may not be returned until the following business day.  We are closed weekends and major holidays. You have access to a nurse at all times for urgent questions. Please call the main number to the clinic Dept: [phone] and follow the prompts.   For any non-urgent questions, you may also contact your provider using MyChart. We now offer e-Visits for anyone 48 and older to request care online for non-urgent symptoms. For details visit [email or web address].   Also download the MyChart app! Go to the app store, search "MyChart", open the app, select [facility], and log in with your MyChart username and password.  Due to Covid, a mask is required upon entering the hospital/clinic. If you do not have a mask, one will be given to you upon arrival. For doctor visits, patients may have 1 support person aged 48 or older with them. For treatment visits, patients cannot have anyone with them due to current Covid guidelines and our immunocompromised population.

## 2021-05-29 NOTE — Progress Notes (Signed)
Recheck of pulse 107, okay to proceed per Dr. Lindi Adie.

## 2021-06-02 NOTE — Telephone Encounter (Signed)
Called patient back on (05/28/21) and informed her that I spoke with [facility] and he stated to let the patient know that he did not see anything on the pictures she had sent by way of [facility].    He stated if she starts to have any redness,fever, or chills take Tylenol and Ibuprofen.  And she is okay to wait for her appointment with Dr. Marla Roe.  Patient verbalized understanding and agreed.//AB/CMA

## 2021-06-03 ENCOUNTER — Other Ambulatory Visit: Payer: Self-pay

## 2021-06-03 ENCOUNTER — Encounter: Payer: Self-pay | Admitting: Plastic Surgery

## 2021-06-03 ENCOUNTER — Ambulatory Visit: Payer: 59 | Admitting: Plastic Surgery

## 2021-06-03 DIAGNOSIS — Z170 Estrogen receptor positive status [ER+]: Secondary | ICD-10-CM

## 2021-06-03 DIAGNOSIS — C50412 Malignant neoplasm of upper-outer quadrant of left female breast: Secondary | ICD-10-CM

## 2021-06-03 DIAGNOSIS — Z9013 Acquired absence of bilateral breasts and nipples: Secondary | ICD-10-CM

## 2021-06-03 NOTE — Progress Notes (Signed)
   Subjective:    Patient ID: Jennifer Davis, female    DOB: 1976/03/18, 45 y.o.   MRN: [id number]  Patient is a 45 year old female here with her husband for follow-up.  She had bilateral mastectomies with expanders placed.  We have had several challenges with the right breast expander.  I think this was related to her chemotherapy.  We did eventually have to take out the expander and leave it out.  She has a little seroma in that area.  Nothing that appears to be infected.  She is going to get radiation after she is finished with her chemo which should be in the next month or so.  We are now thinking that autologous reconstruction may be her best bet for symmetry and alleviating the risk of the infection.     Review of Systems  Constitutional: Negative.   Eyes: Negative.   Respiratory: Negative.    Cardiovascular: Negative.   Gastrointestinal: Negative.   Endocrine: Negative.   Genitourinary: Negative.       Objective:   Physical Exam Vitals and nursing note reviewed.  Constitutional:      Appearance: Normal appearance.  Cardiovascular:     Rate and Rhythm: Normal rate.     Pulses: Normal pulses.  Pulmonary:     Effort: Pulmonary effort is normal.  Skin:    Capillary Refill: Capillary refill takes less than 2 seconds.  Neurological:     General: No focal deficit present.     Mental Status: She is alert.  Psychiatric:        Mood and Affect: Mood normal.        Thought Content: Thought content normal.       Assessment & Plan:     ICD-10-CM   1. S/P mastectomy, bilateral  Z90.13     2. Malignant neoplasm of upper-outer quadrant of left breast in female, estrogen receptor positive ([place])  C50.412    Z17.0     3. Acquired absence of both breasts  Z90.13       Patient is going to think about her options.  I would like to see her back in a couple of months after she is finished her radiation.  We can then discuss autologous reconstruction versus no reconstruction.  She does  have time to think this through.  In the meantime if she has any questions or concerns she can call anytime.  Pictures were obtained of the patient and placed in the chart with the patient's or guardian's permission.

## 2021-06-05 ENCOUNTER — Inpatient Hospital Stay: Payer: 59

## 2021-06-05 ENCOUNTER — Other Ambulatory Visit: Payer: Self-pay

## 2021-06-05 VITALS — BP 142/89 | HR 98 | Temp 98.3°F | Resp 18 | Wt 224.8 lb

## 2021-06-05 DIAGNOSIS — Z170 Estrogen receptor positive status [ER+]: Secondary | ICD-10-CM | POA: Diagnosis not present

## 2021-06-05 DIAGNOSIS — C50412 Malignant neoplasm of upper-outer quadrant of left female breast: Secondary | ICD-10-CM

## 2021-06-05 DIAGNOSIS — Z5111 Encounter for antineoplastic chemotherapy: Secondary | ICD-10-CM | POA: Diagnosis not present

## 2021-06-05 DIAGNOSIS — C773 Secondary and unspecified malignant neoplasm of axilla and upper limb lymph nodes: Secondary | ICD-10-CM | POA: Diagnosis not present

## 2021-06-05 DIAGNOSIS — Z95828 Presence of other vascular implants and grafts: Secondary | ICD-10-CM

## 2021-06-05 DIAGNOSIS — Z5189 Encounter for other specified aftercare: Secondary | ICD-10-CM | POA: Diagnosis not present

## 2021-06-05 DIAGNOSIS — Z79899 Other long term (current) drug therapy: Secondary | ICD-10-CM | POA: Diagnosis not present

## 2021-06-05 LAB — COMPREHENSIVE METABOLIC PANEL
ALT: 38 U/L (ref 0–44)
AST: 24 U/L (ref 15–41)
Albumin: 3.3 g/dL — ABNORMAL LOW (ref 3.5–5.0)
Alkaline Phosphatase: 90 U/L (ref 38–126)
Anion gap: 9 (ref 5–15)
BUN: 9 mg/dL (ref 6–20)
CO2: 25 mmol/L (ref 22–32)
Calcium: 9.2 mg/dL (ref 8.9–10.3)
Chloride: 107 mmol/L (ref 98–111)
Creatinine, Ser: 0.76 mg/dL (ref 0.44–1.00)
GFR, Estimated: >60 mL/min (ref >60)
Glucose, Bld: 133 mg/dL — ABNORMAL HIGH (ref 70–99)
Potassium: 3.5 mmol/L (ref 3.5–5.1)
Sodium: 141 mmol/L (ref 135–145)
Total Bilirubin: <0.2 mg/dL — ABNORMAL LOW (ref 0.3–1.2)
Total Protein: 6.7 g/dL (ref 6.5–8.1)

## 2021-06-05 LAB — CBC WITH DIFFERENTIAL/PLATELET
Abs Immature Granulocytes: 0.35 10*3/uL — ABNORMAL HIGH (ref 0.00–0.07)
Basophils Absolute: 0 10*3/uL (ref 0.0–0.1)
Basophils Relative: 1 %
Eosinophils Absolute: 0.2 10*3/uL (ref 0.0–0.5)
Eosinophils Relative: 3 %
HCT: 30.1 % — ABNORMAL LOW (ref 36.0–46.0)
Hemoglobin: 9.5 g/dL — ABNORMAL LOW (ref 12.0–15.0)
Immature Granulocytes: 5 %
Lymphocytes Relative: 17 %
Lymphs Abs: 1.2 10*3/uL (ref 0.7–4.0)
MCH: 28.2 pg (ref 26.0–34.0)
MCHC: 31.6 g/dL (ref 30.0–36.0)
MCV: 89.3 fL (ref 80.0–100.0)
Monocytes Absolute: 0.5 10*3/uL (ref 0.1–1.0)
Monocytes Relative: 7 %
Neutro Abs: 4.7 10*3/uL (ref 1.7–7.7)
Neutrophils Relative %: 67 %
Platelets: 486 10*3/uL — ABNORMAL HIGH (ref 150–400)
RBC: 3.37 MIL/uL — ABNORMAL LOW (ref 3.87–5.11)
RDW: 21.2 % — ABNORMAL HIGH (ref 11.5–15.5)
WBC: 7 10*3/uL (ref 4.0–10.5)
nRBC: 0.7 % — ABNORMAL HIGH (ref 0.0–0.2)

## 2021-06-05 MED ORDER — SODIUM CHLORIDE 0.9 % IV SOLN
65.0000 mg/m2 | Freq: Once | INTRAVENOUS | Status: AC
  Administered 2021-06-05: 126 mg via INTRAVENOUS
  Filled 2021-06-05: qty 21

## 2021-06-05 MED ORDER — HEPARIN SOD (PORK) LOCK FLUSH 100 UNIT/ML IV SOLN
500.0000 [IU] | Freq: Once | INTRAVENOUS | Status: AC | PRN
  Administered 2021-06-05: 500 [IU]
  Filled 2021-06-05: qty 5

## 2021-06-05 MED ORDER — SODIUM CHLORIDE 0.9% FLUSH
10.0000 mL | Freq: Once | INTRAVENOUS | Status: AC
  Administered 2021-06-05: 10 mL
  Filled 2021-06-05: qty 10

## 2021-06-05 MED ORDER — FAMOTIDINE 20 MG IN NS 100 ML IVPB
INTRAVENOUS | Status: AC
  Filled 2021-06-05: qty 100

## 2021-06-05 MED ORDER — SODIUM CHLORIDE 0.9% FLUSH
10.0000 mL | INTRAVENOUS | Status: DC | PRN
  Administered 2021-06-05: 10 mL
  Filled 2021-06-05: qty 10

## 2021-06-05 MED ORDER — DIPHENHYDRAMINE HCL 50 MG/ML IJ SOLN
25.0000 mg | Freq: Once | INTRAMUSCULAR | Status: AC
  Administered 2021-06-05: 25 mg via INTRAVENOUS

## 2021-06-05 MED ORDER — FAMOTIDINE 20 MG IN NS 100 ML IVPB
20.0000 mg | Freq: Once | INTRAVENOUS | Status: AC
  Administered 2021-06-05: 20 mg via INTRAVENOUS

## 2021-06-05 MED ORDER — SODIUM CHLORIDE 0.9 % IV SOLN
10.0000 mg | Freq: Once | INTRAVENOUS | Status: AC
  Administered 2021-06-05: 10 mg via INTRAVENOUS
  Filled 2021-06-05: qty 10

## 2021-06-05 MED ORDER — SODIUM CHLORIDE 0.9 % IV SOLN
Freq: Once | INTRAVENOUS | Status: AC
  Filled 2021-06-05: qty 250

## 2021-06-05 MED ORDER — DIPHENHYDRAMINE HCL 50 MG/ML IJ SOLN
INTRAMUSCULAR | Status: AC
  Filled 2021-06-05: qty 1

## 2021-06-05 NOTE — Patient Instructions (Signed)
[facility]  Discharge Instructions: Thank you for choosing [facility] to provide your oncology and hematology care.   If you have a lab appointment with the [facility], please go directly to the [facility] and check in at the registration area.   Wear comfortable clothing and clothing appropriate for easy access to any Portacath or PICC line.   We strive to give you quality time with your provider. You may need to reschedule your appointment if you arrive late (15 or more minutes).  Arriving late affects you and other patients whose appointments are after yours.  Also, if you miss three or more appointments without notifying the office, you may be dismissed from the clinic at the provider's discretion.      For prescription refill requests, have your pharmacy contact our office and allow 72 hours for refills to be completed.    Today you received the following chemotherapy and/or immunotherapy agents : Taxol    To help prevent nausea and vomiting after your treatment, we encourage you to take your nausea medication as directed.  BELOW ARE SYMPTOMS THAT SHOULD BE REPORTED IMMEDIATELY: *FEVER GREATER THAN 100.4 F (38 C) OR HIGHER *CHILLS OR SWEATING *NAUSEA AND VOMITING THAT IS NOT CONTROLLED WITH YOUR NAUSEA MEDICATION *UNUSUAL SHORTNESS OF BREATH *UNUSUAL BRUISING OR BLEEDING *URINARY PROBLEMS (pain or burning when urinating, or frequent urination) *BOWEL PROBLEMS (unusual diarrhea, constipation, pain near the anus) TENDERNESS IN MOUTH AND THROAT WITH OR WITHOUT PRESENCE OF ULCERS (sore throat, sores in mouth, or a toothache) UNUSUAL RASH, SWELLING OR PAIN  UNUSUAL VAGINAL DISCHARGE OR ITCHING   Items with * indicate a potential emergency and should be followed up as soon as possible or go to the Emergency Department if any problems should occur.  Please show the CHEMOTHERAPY ALERT CARD or IMMUNOTHERAPY ALERT CARD at check-in to the  Emergency Department and triage nurse.  Should you have questions after your visit or need to cancel or reschedule your appointment, please contact [facility]  Dept: [phone]  and follow the prompts.  Office hours are 8:00 a.m. to 4:30 p.m. Monday - Friday. Please note that voicemails left after 4:00 p.m. may not be returned until the following business day.  We are closed weekends and major holidays. You have access to a nurse at all times for urgent questions. Please call the main number to the clinic Dept: [phone] and follow the prompts.   For any non-urgent questions, you may also contact your provider using MyChart. We now offer e-Visits for anyone 66 and older to request care online for non-urgent symptoms. For details visit [email or web address].   Also download the MyChart app! Go to the app store, search "MyChart", open the app, select [facility], and log in with your MyChart username and password.  Due to Covid, a mask is required upon entering the hospital/clinic. If you do not have a mask, one will be given to you upon arrival. For doctor visits, patients may have 1 support person aged 45 or older with them. For treatment visits, patients cannot have anyone with them due to current Covid guidelines and our immunocompromised population.

## 2021-06-11 NOTE — Assessment & Plan Note (Signed)
09/20/2019: Screening mammogram on 08/28/20 showed the stable bilateral masses, a new 0.9cm mass at the 1 o'clock position in the left breast, and one abnormal left axillary lymph node with 0.6cm cortical thickening. 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 MammaPrint: High risk: Luminal type B, 5-year metastasis free survival with chemo and hormone therapy: 93%, absolute benefit from chemo greater than 12%, average 10-year risk of recurrence untreated: 29%  Treatment plan: 1.Mastectomywith targeted node dissection 2.adjuvant chemotherapy with dose dense Adriamycin and Cytoxan followed by Taxol 3.Adjuvant radiation 4.Followed by adjuvant antiestrogen therapy ------------------------------------------------------------------------------------------------------------- Bilateral mastectomies with reconstruction [name]): Right breast: no malignancy Left breast: invasive and in situ ductal carcinoma, 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 -------------------------------------------------------------------------------------------------- Current Treatment:Completed 4 cycles of dose denseAdriamycin and Cytoxan,today cycle10ofTaxol 12/30/20: ECHO EF 65-70% Labs reviewed  Chemo Toxicities: 1.Fatigue: Improvement. She thinks this is a walk in the park compared to previous chemo. 2.Nausea: Improved 3.Anemia: Hemoglobin9.8: On oral iron therapy, slowly recovering 4.Constipation: Patient is taking more fiber in her diet and its helped her. 5.Upper respiratory infection: We started on second course of antibiotics with Levaquin she thinks is getting better.. 6.  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

## 2021-06-11 NOTE — Progress Notes (Signed)
Patient Care Team: Wenda Low, MD as PCP - General (Internal Medicine) Rockwell Germany, RN as Oncology Nurse Navigator Mauro Kaufmann, RN as Oncology Nurse Navigator Coralie Keens, MD as Consulting Physician (General Surgery) Nicholas Lose, MD as Consulting Physician (Hematology and Oncology) Gery Pray, MD as Consulting Physician (Radiation Oncology) Gwyndolyn Kaufman, RN (Inactive) as Registered Nurse  DIAGNOSIS:    ICD-10-CM   1. Malignant neoplasm of upper-outer quadrant of left breast in female, estrogen receptor positive ([place])  C50.412    Z17.0       SUMMARY OF ONCOLOGIC HISTORY: Oncology History  Malignant neoplasm of upper-outer quadrant of left breast in female, estrogen receptor positive ([place])  09/19/2020 Initial Diagnosis   Mammogram in 07/2018 showed probably benign bilateral breast masses that were stable on her last mammogram on 07/26/19. Mammogram on 08/28/20 showed the stable bilateral masses, a new 0.9cm mass at the 1 o'clock position in the left breast, and one abnormal left axillary lymph node with 0.6cm cortical thickening. 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    10/02/2020 Genetic Testing   Negative genetic testing: no pathogenic variants detected in [facility] Common-Hereditary Cancers Panel.  Variants of uncertain significance detected in APC c.6918T>A (p.Asp2306Glu) and POLE  c.2612G>C (p.Ser871Thr).  The report date is November 24. 2021.   The Common Hereditary Cancers Panel offered by [facility] includes sequencing and/or deletion duplication testing of the following 48 genes: APC, ATM, AXIN2, BARD1, BMPR1A, BRCA1, BRCA2, BRIP1, CDH1, CDK4, CDKN2A (p14ARF), CDKN2A (p16INK4a), CHEK2, CTNNA1, DICER1, EPCAM (Deletion/duplication testing only), GREM1 (promoter region deletion/duplication testing only), KIT, MEN1, MLH1, MSH2, MSH3, MSH6, MUTYH, NBN, NF1,  NHTL1, PALB2, PDGFRA, PMS2, POLD1, POLE, PTEN, RAD50, RAD51C, RAD51D, RNF43, SDHB, SDHC, SDHD, SMAD4, SMARCA4. STK11, TP53, TSC1, TSC2, and VHL.  The following genes were evaluated for sequence changes only: SDHA and HOXB13 c.251G>A variant only.   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    12/04/2020 Cancer Staging   Staging form: Breast, AJCC 8th Edition - Pathologic stage from 12/04/2020: Stage IA (pT1c, pN1a, cM0, G2, ER+, PR+, HER2-) - Signed by [name], NP on 12/18/2020  Histologic grading system: 3 grade system    01/22/2021 -  Chemotherapy    Patient is on Treatment Plan: BREAST ADJUVANT DOSE DENSE AC Q14D / PACLITAXEL Q7D         CHIEF COMPLIANT: Cycle 11 Taxol  INTERVAL HISTORY: Jennifer Davis is a 44 y.o. with above-mentioned history of  left breast cancer who underwent bilateral mastectomies with reconstruction and is currently on adjuvant chemotherapy with weekly Taxol after completing 4 cycles of Adriamycin and Cytoxan. She presents to the clinic today for a toxicity check and cycle 11.   ALLERGIES:  is allergic to bee venom and other.  MEDICATIONS:  Current Outpatient Medications  Medication Sig Dispense Refill   acetaminophen (TYLENOL) 500 MG tablet TAKE 1 TABLET (500 MG TOTAL) BY MOUTH EVERY 6 (SIX) HOURS AS NEEDED. FOR USE AFTER SURGERY (Patient taking differently: Take 500 mg by mouth every 6 (six) hours as needed for mild pain, fever or headache.) 30 tablet 0   budesonide-formoterol (SYMBICORT) 160-4.5 MCG/ACT inhaler INHALE 2 PUFFS BY MOUTH 2 TIMES A DAY 10.2 g 5  busPIRone (BUSPAR) 30 MG tablet TAKE 1 TABLET (30 MG TOTAL) BY MOUTH 2 (TWO) TIMES DAILY. 60 tablet 5   cetirizine (ZYRTEC) 10 MG tablet Take 10 mg by mouth daily.     diazepam (VALIUM) 2 MG  tablet TAKE 1 TABLET (2 MG TOTAL) BY MOUTH EVERY 12 (TWELVE) HOURS AS NEEDED FOR UP TO 15 DAYS FOR MUSCLE SPASMS. 30 tablet 0   DULoxetine (CYMBALTA) 60 MG capsule Take 2 capsules (120 mg total) by mouth daily. 60 capsule 0   EPINEPHrine (EPIPEN 2-PAK) 0.3 mg/0.3 mL IJ SOAJ injection Inject 0.3 mLs (0.3 mg total) into the muscle once as needed for up to 1 dose (for severe allergic reaction). CAll 911 immediately if you have to use this medicine 1 each 1   gabapentin (NEURONTIN) 100 MG capsule Take 1 capsule (100 mg total) by mouth 3 (three) times daily as needed. (Patient taking differently: Take 100 mg by mouth 3 (three) times daily as needed (neuropathy).) 90 capsule 1   ibuprofen (ADVIL) 600 MG tablet TAKE 1 TABLET (600 MG TOTAL) BY MOUTH EVERY 6 (SIX) HOURS AS NEEDED FOR MILD PAIN OR MODERATE PAIN. FOR USE AFTER SURGERY 30 tablet 0   levofloxacin (LEVAQUIN) 500 MG tablet Take 1 tablet (500 mg total) by mouth daily. 7 tablet 0   lidocaine-prilocaine (EMLA) cream APPLY TO AFFECTED AREA ONCE 30 g 3   loratadine (CLARITIN) 10 MG tablet Take 10 mg by mouth daily.     LORazepam (ATIVAN) 0.5 MG tablet Take 0.5 mg by mouth at bedtime as needed (nausea).     montelukast (SINGULAIR) 10 MG tablet Take 10 mg by mouth at bedtime.  5   Multiple Vitamin (MULTIVITAMIN WITH MINERALS) TABS tablet Take 1 tablet by mouth daily.     PROAIR HFA 108 (90 Base) MCG/ACT inhaler Inhale 2 puffs into the lungs as needed for wheezing or shortness of breath.  0   prochlorperazine (COMPAZINE) 10 MG tablet TAKE 1 TABLET (10 MG TOTAL) BY MOUTH EVERY 6 (SIX) HOURS AS NEEDED (NAUSEA OR VOMITING). 30 tablet 1   No current facility-administered medications for this visit.    PHYSICAL EXAMINATION: ECOG PERFORMANCE STATUS: 1 - Symptomatic but completely ambulatory  Vitals:   06/12/21 1121  BP: 135/87  Pulse: (!) 107  Resp: 18  Temp: 98.1 F (36.7 C)  SpO2: 99%   Filed Weights   06/12/21 1121  Weight: 224 lb 9.6 oz (101.9  kg)      LABORATORY DATA:  I have reviewed the data as listed CMP Latest Ref Rng & Units 06/05/2021 05/29/2021 05/22/2021  Glucose 70 - 99 mg/dL 133(H) 157(H) 136(H)  BUN 6 - 20 mg/dL '9 7 8  ' Creatinine 0.44 - 1.00 mg/dL 0.76 0.73 0.79  Sodium 135 - 145 mmol/L 141 138 140  Potassium 3.5 - 5.1 mmol/L 3.5 3.7 3.7  Chloride 98 - 111 mmol/L 107 106 106  CO2 22 - 32 mmol/L '25 25 25  ' Calcium 8.9 - 10.3 mg/dL 9.2 8.8(L) 9.1  Total Protein 6.5 - 8.1 g/dL 6.7 6.9 7.0  Total Bilirubin 0.3 - 1.2 mg/dL <0.2(L) 0.4 0.3  Alkaline Phos 38 - 126 U/L 90 77 82  AST 15 - 41 U/L 24 29 47(H)  ALT 0 - 44 U/L 38 36 63(H)    Lab Results  Component Value Date   WBC 6.1 06/12/2021   HGB 9.5 (L) 06/12/2021   HCT 30.2 (L) 06/12/2021   MCV 89.1 06/12/2021   PLT 479 (  H) 06/12/2021   NEUTROABS 4.4 06/12/2021    ASSESSMENT & PLAN:  Malignant neoplasm of upper-outer quadrant of left breast in female, estrogen receptor positive ([place]) 09/20/2019: Screening mammogram on 08/28/20 showed the stable bilateral masses, a new 0.9cm mass at the 1 o'clock position in the left breast, and one abnormal left axillary lymph node with 0.6cm cortical thickening. 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  MammaPrint: High risk: Luminal type B, 5-year metastasis free survival with chemo and hormone therapy: 93%, absolute benefit from chemo greater than 12%, average 10-year risk of recurrence untreated: 29%   Treatment plan: 1.  Mastectomy with targeted node dissection 2.  adjuvant chemotherapy with dose dense Adriamycin and Cytoxan followed by Taxol 3.  Adjuvant radiation 4.  Followed by adjuvant antiestrogen therapy ------------------------------------------------------------------------------------------------------------- Bilateral mastectomies with reconstruction [name]): Right breast: no malignancy Left breast:  invasive and in situ ductal carcinoma, 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  -------------------------------------------------------------------------------------------------- Current Treatment: Completed 4 cycles of dose dense Adriamycin and Cytoxan,  today cycle 11 of Taxol 12/30/20: ECHO EF 65-70% Labs reviewed   Chemo Toxicities: 1.  Fatigue: Improvement.  2. Nausea: Improved 3.  Anemia: Hemoglobin 9.5: On oral iron therapy, slowly recovering 4.  Constipation: Patient is taking more fiber in her diet and its helped her. 5.   Chemo induced peripheral neuropathy: On gabapentin 100 mg at bedtime   Return to clinic in 1 week for cycle 12 which will be the last cycle of chemotherapy. I sent a message to our navigators to get her in for radiation appointment We will send a message to Dr. [name] to get the port out.   No orders of the defined types were placed in this encounter.  The patient has a good understanding of the overall plan. she agrees with it. she will call with any problems that may develop before the next visit here.  Total time spent: 30 mins including face to face time and time spent for planning, charting and coordination of care  Rulon Eisenmenger, MD, MPH 06/12/2021  I, Thana Ates, am acting as scribe for Dr. Nicholas Lose.  I have reviewed the above documentation for accuracy and completeness, and I agree with the above.

## 2021-06-12 ENCOUNTER — Inpatient Hospital Stay: Payer: 59

## 2021-06-12 ENCOUNTER — Inpatient Hospital Stay: Payer: 59 | Attending: Hematology and Oncology

## 2021-06-12 ENCOUNTER — Other Ambulatory Visit: Payer: Self-pay

## 2021-06-12 ENCOUNTER — Inpatient Hospital Stay (HOSPITAL_BASED_OUTPATIENT_CLINIC_OR_DEPARTMENT_OTHER): Payer: 59 | Admitting: Hematology and Oncology

## 2021-06-12 ENCOUNTER — Encounter: Payer: Self-pay | Admitting: *Deleted

## 2021-06-12 VITALS — HR 92

## 2021-06-12 DIAGNOSIS — C50412 Malignant neoplasm of upper-outer quadrant of left female breast: Secondary | ICD-10-CM

## 2021-06-12 DIAGNOSIS — Z170 Estrogen receptor positive status [ER+]: Secondary | ICD-10-CM

## 2021-06-12 DIAGNOSIS — Z79899 Other long term (current) drug therapy: Secondary | ICD-10-CM | POA: Insufficient documentation

## 2021-06-12 DIAGNOSIS — Z5111 Encounter for antineoplastic chemotherapy: Secondary | ICD-10-CM | POA: Diagnosis not present

## 2021-06-12 DIAGNOSIS — C773 Secondary and unspecified malignant neoplasm of axilla and upper limb lymph nodes: Secondary | ICD-10-CM | POA: Insufficient documentation

## 2021-06-12 DIAGNOSIS — D6481 Anemia due to antineoplastic chemotherapy: Secondary | ICD-10-CM | POA: Insufficient documentation

## 2021-06-12 LAB — CBC WITH DIFFERENTIAL/PLATELET
Abs Immature Granulocytes: 0.1 10*3/uL — ABNORMAL HIGH (ref 0.00–0.07)
Basophils Absolute: 0 10*3/uL (ref 0.0–0.1)
Basophils Relative: 0 %
Eosinophils Absolute: 0.2 10*3/uL (ref 0.0–0.5)
Eosinophils Relative: 3 %
HCT: 30.2 % — ABNORMAL LOW (ref 36.0–46.0)
Hemoglobin: 9.5 g/dL — ABNORMAL LOW (ref 12.0–15.0)
Immature Granulocytes: 2 %
Lymphocytes Relative: 17 %
Lymphs Abs: 1.1 10*3/uL (ref 0.7–4.0)
MCH: 28 pg (ref 26.0–34.0)
MCHC: 31.5 g/dL (ref 30.0–36.0)
MCV: 89.1 fL (ref 80.0–100.0)
Monocytes Absolute: 0.3 10*3/uL (ref 0.1–1.0)
Monocytes Relative: 6 %
Neutro Abs: 4.4 10*3/uL (ref 1.7–7.7)
Neutrophils Relative %: 72 %
Platelets: 479 10*3/uL — ABNORMAL HIGH (ref 150–400)
RBC: 3.39 MIL/uL — ABNORMAL LOW (ref 3.87–5.11)
RDW: 21.1 % — ABNORMAL HIGH (ref 11.5–15.5)
WBC: 6.1 10*3/uL (ref 4.0–10.5)
nRBC: 0.3 % — ABNORMAL HIGH (ref 0.0–0.2)

## 2021-06-12 LAB — COMPREHENSIVE METABOLIC PANEL
ALT: 51 U/L — ABNORMAL HIGH (ref 0–44)
AST: 32 U/L (ref 15–41)
Albumin: 3.4 g/dL — ABNORMAL LOW (ref 3.5–5.0)
Alkaline Phosphatase: 74 U/L (ref 38–126)
Anion gap: 10 (ref 5–15)
BUN: 8 mg/dL (ref 6–20)
CO2: 25 mmol/L (ref 22–32)
Calcium: 9 mg/dL (ref 8.9–10.3)
Chloride: 106 mmol/L (ref 98–111)
Creatinine, Ser: 0.74 mg/dL (ref 0.44–1.00)
GFR, Estimated: >60 mL/min (ref >60)
Glucose, Bld: 128 mg/dL — ABNORMAL HIGH (ref 70–99)
Potassium: 3.5 mmol/L (ref 3.5–5.1)
Sodium: 141 mmol/L (ref 135–145)
Total Bilirubin: 0.2 mg/dL — ABNORMAL LOW (ref 0.3–1.2)
Total Protein: 6.4 g/dL — ABNORMAL LOW (ref 6.5–8.1)

## 2021-06-12 MED ORDER — SODIUM CHLORIDE 0.9 % IV SOLN
10.0000 mg | Freq: Once | INTRAVENOUS | Status: AC
  Administered 2021-06-12: 10 mg via INTRAVENOUS
  Filled 2021-06-12: qty 10

## 2021-06-12 MED ORDER — SODIUM CHLORIDE 0.9 % IV SOLN
Freq: Once | INTRAVENOUS | Status: AC
  Filled 2021-06-12: qty 250

## 2021-06-12 MED ORDER — DIPHENHYDRAMINE HCL 50 MG/ML IJ SOLN
INTRAMUSCULAR | Status: AC
  Filled 2021-06-12: qty 1

## 2021-06-12 MED ORDER — SODIUM CHLORIDE 0.9 % IV SOLN
65.0000 mg/m2 | Freq: Once | INTRAVENOUS | Status: AC
  Administered 2021-06-12: 126 mg via INTRAVENOUS
  Filled 2021-06-12: qty 21

## 2021-06-12 MED ORDER — SODIUM CHLORIDE 0.9% FLUSH
10.0000 mL | INTRAVENOUS | Status: DC | PRN
  Administered 2021-06-12: 10 mL
  Filled 2021-06-12: qty 10

## 2021-06-12 MED ORDER — FAMOTIDINE 20 MG IN NS 100 ML IVPB
20.0000 mg | Freq: Once | INTRAVENOUS | Status: AC
  Administered 2021-06-12: 20 mg via INTRAVENOUS

## 2021-06-12 MED ORDER — DIPHENHYDRAMINE HCL 50 MG/ML IJ SOLN
25.0000 mg | Freq: Once | INTRAMUSCULAR | Status: AC
  Administered 2021-06-12: 25 mg via INTRAVENOUS

## 2021-06-12 MED ORDER — FAMOTIDINE 20 MG IN NS 100 ML IVPB
INTRAVENOUS | Status: AC
  Filled 2021-06-12: qty 100

## 2021-06-12 MED ORDER — HEPARIN SOD (PORK) LOCK FLUSH 100 UNIT/ML IV SOLN
500.0000 [IU] | Freq: Once | INTRAVENOUS | Status: AC | PRN
  Administered 2021-06-12: 500 [IU]
  Filled 2021-06-12: qty 5

## 2021-06-12 NOTE — Patient Instructions (Signed)
[facility]  Discharge Instructions: Thank you for choosing [facility] to provide your oncology and hematology care.   If you have a lab appointment with the [facility], please go directly to the [facility] and check in at the registration area.   Wear comfortable clothing and clothing appropriate for easy access to any Portacath or PICC line.   We strive to give you quality time with your provider. You may need to reschedule your appointment if you arrive late (15 or more minutes).  Arriving late affects you and other patients whose appointments are after yours.  Also, if you miss three or more appointments without notifying the office, you may be dismissed from the clinic at the provider's discretion.      For prescription refill requests, have your pharmacy contact our office and allow 72 hours for refills to be completed.    Today you received the following chemotherapy and/or immunotherapy agents: Taxol    To help prevent nausea and vomiting after your treatment, we encourage you to take your nausea medication as directed.  BELOW ARE SYMPTOMS THAT SHOULD BE REPORTED IMMEDIATELY: *FEVER GREATER THAN 100.4 F (38 C) OR HIGHER *CHILLS OR SWEATING *NAUSEA AND VOMITING THAT IS NOT CONTROLLED WITH YOUR NAUSEA MEDICATION *UNUSUAL SHORTNESS OF BREATH *UNUSUAL BRUISING OR BLEEDING *URINARY PROBLEMS (pain or burning when urinating, or frequent urination) *BOWEL PROBLEMS (unusual diarrhea, constipation, pain near the anus) TENDERNESS IN MOUTH AND THROAT WITH OR WITHOUT PRESENCE OF ULCERS (sore throat, sores in mouth, or a toothache) UNUSUAL RASH, SWELLING OR PAIN  UNUSUAL VAGINAL DISCHARGE OR ITCHING   Items with * indicate a potential emergency and should be followed up as soon as possible or go to the Emergency Department if any problems should occur.  Please show the CHEMOTHERAPY ALERT CARD or IMMUNOTHERAPY ALERT CARD at check-in to the  Emergency Department and triage nurse.  Should you have questions after your visit or need to cancel or reschedule your appointment, please contact [facility]  Dept: [phone]  and follow the prompts.  Office hours are 8:00 a.m. to 4:30 p.m. Monday - Friday. Please note that voicemails left after 4:00 p.m. may not be returned until the following business day.  We are closed weekends and major holidays. You have access to a nurse at all times for urgent questions. Please call the main number to the clinic Dept: [phone] and follow the prompts.   For any non-urgent questions, you may also contact your provider using MyChart. We now offer e-Visits for anyone 70 and older to request care online for non-urgent symptoms. For details visit [email or web address].   Also download the MyChart app! Go to the app store, search "MyChart", open the app, select [facility], and log in with your MyChart username and password.  Due to Covid, a mask is required upon entering the hospital/clinic. If you do not have a mask, one will be given to you upon arrival. For doctor visits, patients may have 1 support person aged 25 or older with them. For treatment visits, patients cannot have anyone with them due to current Covid guidelines and our immunocompromised population.

## 2021-06-18 NOTE — Progress Notes (Signed)
Patient Care Team: Wenda Low, MD as PCP - General (Internal Medicine) Rockwell Germany, RN as Oncology Nurse Navigator Mauro Kaufmann, RN as Oncology Nurse Navigator Coralie Keens, MD as Consulting Physician (General Surgery) Nicholas Lose, MD as Consulting Physician (Hematology and Oncology) Gery Pray, MD as Consulting Physician (Radiation Oncology) Gwyndolyn Kaufman, RN (Inactive) as Registered Nurse  DIAGNOSIS:    ICD-10-CM   1. Malignant neoplasm of upper-outer quadrant of left breast in female, estrogen receptor positive ([place])  C50.412    Z17.0       SUMMARY OF ONCOLOGIC HISTORY: Oncology History  Malignant neoplasm of upper-outer quadrant of left breast in female, estrogen receptor positive ([place])  09/19/2020 Initial Diagnosis   Mammogram in 07/2018 showed probably benign bilateral breast masses that were stable on her last mammogram on 07/26/19. Mammogram on 08/28/20 showed the stable bilateral masses, a new 0.9cm mass at the 1 o'clock position in the left breast, and one abnormal left axillary lymph node with 0.6cm cortical thickening. 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    10/02/2020 Genetic Testing   Negative genetic testing: no pathogenic variants detected in [facility] Common-Hereditary Cancers Panel.  Variants of uncertain significance detected in APC c.6918T>A (p.Asp2306Glu) and POLE  c.2612G>C (p.Ser871Thr).  The report date is November 24. 2021.   The Common Hereditary Cancers Panel offered by [facility] includes sequencing and/or deletion duplication testing of the following 48 genes: APC, ATM, AXIN2, BARD1, BMPR1A, BRCA1, BRCA2, BRIP1, CDH1, CDK4, CDKN2A (p14ARF), CDKN2A (p16INK4a), CHEK2, CTNNA1, DICER1, EPCAM (Deletion/duplication testing only), GREM1 (promoter region deletion/duplication testing only), KIT, MEN1, MLH1, MSH2, MSH3, MSH6, MUTYH, NBN, NF1,  NHTL1, PALB2, PDGFRA, PMS2, POLD1, POLE, PTEN, RAD50, RAD51C, RAD51D, RNF43, SDHB, SDHC, SDHD, SMAD4, SMARCA4. STK11, TP53, TSC1, TSC2, and VHL.  The following genes were evaluated for sequence changes only: SDHA and HOXB13 c.251G>A variant only.   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    12/04/2020 Cancer Staging   Staging form: Breast, AJCC 8th Edition - Pathologic stage from 12/04/2020: Stage IA (pT1c, pN1a, cM0, G2, ER+, PR+, HER2-) - Signed by [name], NP on 12/18/2020 Histologic grading system: 3 grade system   01/22/2021 -  Chemotherapy    Patient is on Treatment Plan: BREAST ADJUVANT DOSE DENSE AC Q14D / PACLITAXEL Q7D         CHIEF COMPLIANT: Cycle 12 Taxol  INTERVAL HISTORY: Jennifer Davis is a 45 y.o. with above-mentioned history of  left breast cancer who underwent bilateral mastectomies with reconstruction and is currently on adjuvant chemotherapy with weekly Taxol after completing 4 cycles of Adriamycin and Cytoxan. She presents to the clinic today for a toxicity check and cycle 12.  Her major complaint is a maculopapular rash on her cheeks going behind the ears on both sides.  She continues to have mild peripheral neuropathy.  ALLERGIES:  is allergic to bee venom and other.  MEDICATIONS:  Current Outpatient Medications  Medication Sig Dispense Refill   acetaminophen (TYLENOL) 500 MG tablet TAKE 1 TABLET (500 MG TOTAL) BY MOUTH EVERY 6 (SIX) HOURS AS NEEDED. FOR USE AFTER SURGERY (Patient taking differently: Take 500 mg by mouth every 6 (six) hours as needed for mild pain, fever or  headache.) 30 tablet 0   budesonide-formoterol (SYMBICORT) 160-4.5 MCG/ACT inhaler INHALE 2 PUFFS BY MOUTH 2 TIMES A DAY 10.2 g 5   busPIRone (BUSPAR) 30 MG tablet TAKE 1 TABLET (30 MG  TOTAL) BY MOUTH 2 (TWO) TIMES DAILY. 60 tablet 5   cetirizine (ZYRTEC) 10 MG tablet Take 10 mg by mouth daily.     diazepam (VALIUM) 2 MG tablet TAKE 1 TABLET (2 MG TOTAL) BY MOUTH EVERY 12 (TWELVE) HOURS AS NEEDED FOR UP TO 15 DAYS FOR MUSCLE SPASMS. 30 tablet 0   DULoxetine (CYMBALTA) 60 MG capsule Take 2 capsules (120 mg total) by mouth daily. 60 capsule 0   EPINEPHrine (EPIPEN 2-PAK) 0.3 mg/0.3 mL IJ SOAJ injection Inject 0.3 mLs (0.3 mg total) into the muscle once as needed for up to 1 dose (for severe allergic reaction). CAll 911 immediately if you have to use this medicine 1 each 1   ibuprofen (ADVIL) 600 MG tablet TAKE 1 TABLET (600 MG TOTAL) BY MOUTH EVERY 6 (SIX) HOURS AS NEEDED FOR MILD PAIN OR MODERATE PAIN. FOR USE AFTER SURGERY 30 tablet 0   lidocaine-prilocaine (EMLA) cream APPLY TO AFFECTED AREA ONCE 30 g 3   loratadine (CLARITIN) 10 MG tablet Take 10 mg by mouth daily.     LORazepam (ATIVAN) 0.5 MG tablet Take 0.5 mg by mouth at bedtime as needed (nausea).     montelukast (SINGULAIR) 10 MG tablet Take 10 mg by mouth at bedtime.  5   Multiple Vitamin (MULTIVITAMIN WITH MINERALS) TABS tablet Take 1 tablet by mouth daily.     PROAIR HFA 108 (90 Base) MCG/ACT inhaler Inhale 2 puffs into the lungs as needed for wheezing or shortness of breath.  0   prochlorperazine (COMPAZINE) 10 MG tablet TAKE 1 TABLET (10 MG TOTAL) BY MOUTH EVERY 6 (SIX) HOURS AS NEEDED (NAUSEA OR VOMITING). 30 tablet 1   No current facility-administered medications for this visit.    PHYSICAL EXAMINATION: ECOG PERFORMANCE STATUS: 1 - Symptomatic but completely ambulatory  Vitals:   06/19/21 1349  BP: (!) 128/91  Pulse: (!) 116  Resp: 19  Temp: (!) 97.2 F (36.2 C)  SpO2: 100%   Filed Weights   06/19/21 1349  Weight: 226 lb 3.2 oz (102.6 kg)    LABORATORY DATA:  I have reviewed the data as listed CMP Latest Ref Rng & Units 06/12/2021 06/05/2021 05/29/2021  Glucose 70 - 99 mg/dL 128(H) 133(H) 157(H)   BUN 6 - 20 mg/dL _0 Creatinine 0.44 - 1.00 mg/dL 0.74 0.76 0.73  Sodium 135 - 145 mmol/L 141 141 138  Potassium 3.5 - 5.1 mmol/L 3.5 3.5 3.7  Chloride 98 - 111 mmol/L 106 107 106  CO2 22 - 32 mmol/L _1 Calcium 8.9 - 10.3 mg/dL 9.0 9.2 8.8(L)  Total Protein 6.5 - 8.1 g/dL 6.4(L) 6.7 6.9  Total Bilirubin 0.3 - 1.2 mg/dL 0.2(L) <0.2(L) 0.4  Alkaline Phos 38 - 126 U/L 74 90 77  AST 15 - 41 U/L 32 24 29  ALT 0 - 44 U/L 51(H) 38 36    Lab Results  Component Value Date   WBC 7.1 06/19/2021   HGB 9.9 (L) 06/19/2021   HCT 31.7 (L) 06/19/2021   MCV 89.0 06/19/2021   PLT 465 (H) 06/19/2021   NEUTROABS 5.0 06/19/2021    ASSESSMENT & PLAN:  Malignant neoplasm of upper-outer quadrant of left breast in female, estrogen receptor positive ([place]) 09/20/2019: Screening mammogram on 08/28/20  showed the stable bilateral masses, a new 0.9cm mass at the 1 o'clock position in the left breast, and one abnormal left axillary lymph node with 0.6cm cortical thickening. 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  MammaPrint: High risk: Luminal type B, 5-year metastasis free survival with chemo and hormone therapy: 93%, absolute benefit from chemo greater than 12%, average 10-year risk of recurrence untreated: 29%   Treatment plan: 1.  Mastectomy with targeted node dissection 2.  adjuvant chemotherapy with dose dense Adriamycin and Cytoxan followed by Taxol 3.  Adjuvant radiation 4.  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   -------------------------------------------------------------------------------------------------- Current Treatment: Completed 4 cycles of dose dense Adriamycin and Cytoxan,  today cycle 12 of Taxol (last cycle of chemo) 12/30/20: ECHO EF 65-70% Labs reviewed   Chemo Toxicities: 1.  Fatigue: Improvement.  2. Nausea: Improved 3.  Anemia: Hemoglobin 9.9: On oral iron therapy, slowly recovering 4.  Constipation: Patient is taking more fiber in her diet and its helped her. 5.   Chemo induced peripheral neuropathy: On gabapentin 100 mg at bedtime 6.  Maculopapular rash on the face: Related to Taxol 7.  Itchy and dry eyes along with watering: Also related to Taxol  Return to clinic after radiation is complete    No orders of the defined types were placed in this encounter.  The patient has a good understanding of the overall plan. she agrees with it. she will call with any problems that may develop before the next visit here.  Total time spent: 30 mins including face to face time and time spent for planning, charting and coordination of care  Rulon Eisenmenger, MD, MPH 06/19/2021  I, Thana Ates, am acting as scribe for Dr. Nicholas Lose.  I have reviewed the above documentation for accuracy and completeness, and I agree with the above.

## 2021-06-19 ENCOUNTER — Other Ambulatory Visit: Payer: Self-pay

## 2021-06-19 ENCOUNTER — Inpatient Hospital Stay: Payer: 59

## 2021-06-19 ENCOUNTER — Inpatient Hospital Stay (HOSPITAL_BASED_OUTPATIENT_CLINIC_OR_DEPARTMENT_OTHER): Payer: 59 | Admitting: Hematology and Oncology

## 2021-06-19 ENCOUNTER — Encounter: Payer: Self-pay | Admitting: *Deleted

## 2021-06-19 VITALS — HR 99

## 2021-06-19 DIAGNOSIS — Z170 Estrogen receptor positive status [ER+]: Secondary | ICD-10-CM

## 2021-06-19 DIAGNOSIS — Z5111 Encounter for antineoplastic chemotherapy: Secondary | ICD-10-CM | POA: Diagnosis not present

## 2021-06-19 DIAGNOSIS — D6481 Anemia due to antineoplastic chemotherapy: Secondary | ICD-10-CM | POA: Diagnosis not present

## 2021-06-19 DIAGNOSIS — C773 Secondary and unspecified malignant neoplasm of axilla and upper limb lymph nodes: Secondary | ICD-10-CM | POA: Diagnosis not present

## 2021-06-19 DIAGNOSIS — Z79899 Other long term (current) drug therapy: Secondary | ICD-10-CM | POA: Diagnosis not present

## 2021-06-19 DIAGNOSIS — C50412 Malignant neoplasm of upper-outer quadrant of left female breast: Secondary | ICD-10-CM

## 2021-06-19 DIAGNOSIS — Z95828 Presence of other vascular implants and grafts: Secondary | ICD-10-CM

## 2021-06-19 LAB — CBC WITH DIFFERENTIAL/PLATELET
Abs Immature Granulocytes: 0.23 10*3/uL — ABNORMAL HIGH (ref 0.00–0.07)
Basophils Absolute: 0 10*3/uL (ref 0.0–0.1)
Basophils Relative: 0 %
Eosinophils Absolute: 0.2 10*3/uL (ref 0.0–0.5)
Eosinophils Relative: 3 %
HCT: 31.7 % — ABNORMAL LOW (ref 36.0–46.0)
Hemoglobin: 9.9 g/dL — ABNORMAL LOW (ref 12.0–15.0)
Immature Granulocytes: 3 %
Lymphocytes Relative: 17 %
Lymphs Abs: 1.2 10*3/uL (ref 0.7–4.0)
MCH: 27.8 pg (ref 26.0–34.0)
MCHC: 31.2 g/dL (ref 30.0–36.0)
MCV: 89 fL (ref 80.0–100.0)
Monocytes Absolute: 0.4 10*3/uL (ref 0.1–1.0)
Monocytes Relative: 6 %
Neutro Abs: 5 10*3/uL (ref 1.7–7.7)
Neutrophils Relative %: 71 %
Platelets: 465 10*3/uL — ABNORMAL HIGH (ref 150–400)
RBC: 3.56 MIL/uL — ABNORMAL LOW (ref 3.87–5.11)
RDW: 20.9 % — ABNORMAL HIGH (ref 11.5–15.5)
WBC: 7.1 10*3/uL (ref 4.0–10.5)
nRBC: 1.1 % — ABNORMAL HIGH (ref 0.0–0.2)

## 2021-06-19 LAB — COMPREHENSIVE METABOLIC PANEL
ALT: 50 U/L — ABNORMAL HIGH (ref 0–44)
AST: 33 U/L (ref 15–41)
Albumin: 3.5 g/dL (ref 3.5–5.0)
Alkaline Phosphatase: 83 U/L (ref 38–126)
Anion gap: 10 (ref 5–15)
BUN: 9 mg/dL (ref 6–20)
CO2: 24 mmol/L (ref 22–32)
Calcium: 9.1 mg/dL (ref 8.9–10.3)
Chloride: 107 mmol/L (ref 98–111)
Creatinine, Ser: 0.8 mg/dL (ref 0.44–1.00)
GFR, Estimated: >60 mL/min (ref >60)
Glucose, Bld: 143 mg/dL — ABNORMAL HIGH (ref 70–99)
Potassium: 3.8 mmol/L (ref 3.5–5.1)
Sodium: 141 mmol/L (ref 135–145)
Total Bilirubin: 0.2 mg/dL — ABNORMAL LOW (ref 0.3–1.2)
Total Protein: 6.7 g/dL (ref 6.5–8.1)

## 2021-06-19 MED ORDER — HEPARIN SOD (PORK) LOCK FLUSH 100 UNIT/ML IV SOLN
500.0000 [IU] | Freq: Once | INTRAVENOUS | Status: AC | PRN
  Administered 2021-06-19: 500 [IU]
  Filled 2021-06-19: qty 5

## 2021-06-19 MED ORDER — FAMOTIDINE 20 MG IN NS 100 ML IVPB
20.0000 mg | Freq: Once | INTRAVENOUS | Status: AC
  Administered 2021-06-19: 20 mg via INTRAVENOUS
  Filled 2021-06-19: qty 100

## 2021-06-19 MED ORDER — DIPHENHYDRAMINE HCL 50 MG/ML IJ SOLN
INTRAMUSCULAR | Status: AC
  Filled 2021-06-19: qty 1

## 2021-06-19 MED ORDER — SODIUM CHLORIDE 0.9% FLUSH
10.0000 mL | Freq: Once | INTRAVENOUS | Status: AC
  Administered 2021-06-19: 10 mL
  Filled 2021-06-19: qty 10

## 2021-06-19 MED ORDER — SODIUM CHLORIDE 0.9 % IV SOLN
10.0000 mg | Freq: Once | INTRAVENOUS | Status: AC
  Administered 2021-06-19: 10 mg via INTRAVENOUS
  Filled 2021-06-19: qty 10

## 2021-06-19 MED ORDER — SODIUM CHLORIDE 0.9 % IV SOLN
Freq: Once | INTRAVENOUS | Status: AC
  Filled 2021-06-19: qty 250

## 2021-06-19 MED ORDER — SODIUM CHLORIDE 0.9 % IV SOLN
65.0000 mg/m2 | Freq: Once | INTRAVENOUS | Status: AC
  Administered 2021-06-19: 126 mg via INTRAVENOUS
  Filled 2021-06-19: qty 21

## 2021-06-19 MED ORDER — DIPHENHYDRAMINE HCL 50 MG/ML IJ SOLN
25.0000 mg | Freq: Once | INTRAMUSCULAR | Status: AC
  Administered 2021-06-19: 25 mg via INTRAVENOUS

## 2021-06-19 MED ORDER — SODIUM CHLORIDE 0.9% FLUSH
10.0000 mL | INTRAVENOUS | Status: DC | PRN
  Administered 2021-06-19: 10 mL
  Filled 2021-06-19: qty 10

## 2021-06-19 NOTE — Assessment & Plan Note (Signed)
09/20/2019: Screening mammogram on 08/28/20 showed the stable bilateral masses, a new 0.9cm mass at the 1 o'clock position in the left breast, and one abnormal left axillary lymph node with 0.6cm cortical thickening. 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 MammaPrint: High risk: Luminal type B, 5-year metastasis free survival with chemo and hormone therapy: 93%, absolute benefit from chemo greater than 12%, average 10-year risk of recurrence untreated: 29%  Treatment plan: 1.Mastectomywith targeted node dissection 2.adjuvant chemotherapy with dose dense Adriamycin and Cytoxan followed by Taxol 3.Adjuvant radiation 4.Followed by adjuvant antiestrogen therapy ------------------------------------------------------------------------------------------------------------- Bilateral mastectomies with reconstruction [name]): Right breast: no malignancy Left breast: invasive and in situ ductal carcinoma, 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 -------------------------------------------------------------------------------------------------- Current Treatment:Completed 4 cycles of dose denseAdriamycin and Cytoxan,today cycle12ofTaxol 12/30/20: ECHO EF 65-70% Labs reviewed  Chemo Toxicities: 1.Fatigue: Improvement.  2.Nausea: Improved 3.Anemia: Hemoglobin9.5: On oral iron therapy, slowly recovering 4.Constipation: Patient is taking more fiber in her diet and its helped her. 5.Chemo induced peripheral neuropathy: On gabapentin 100 mg at bedtime  Return to clinic after radiation is complete

## 2021-06-19 NOTE — Patient Instructions (Signed)
[facility]  Discharge Instructions: Thank you for choosing [facility] to provide your oncology and hematology care.   If you have a lab appointment with the [facility], please go directly to the [facility] and check in at the registration area.   Wear comfortable clothing and clothing appropriate for easy access to any Portacath or PICC line.   We strive to give you quality time with your provider. You may need to reschedule your appointment if you arrive late (15 or more minutes).  Arriving late affects you and other patients whose appointments are after yours.  Also, if you miss three or more appointments without notifying the office, you may be dismissed from the clinic at the provider's discretion.      For prescription refill requests, have your pharmacy contact our office and allow 72 hours for refills to be completed.    Today you received the following chemotherapy and/or immunotherapy agents: Taxol    To help prevent nausea and vomiting after your treatment, we encourage you to take your nausea medication as directed.  BELOW ARE SYMPTOMS THAT SHOULD BE REPORTED IMMEDIATELY: *FEVER GREATER THAN 100.4 F (38 C) OR HIGHER *CHILLS OR SWEATING *NAUSEA AND VOMITING THAT IS NOT CONTROLLED WITH YOUR NAUSEA MEDICATION *UNUSUAL SHORTNESS OF BREATH *UNUSUAL BRUISING OR BLEEDING *URINARY PROBLEMS (pain or burning when urinating, or frequent urination) *BOWEL PROBLEMS (unusual diarrhea, constipation, pain near the anus) TENDERNESS IN MOUTH AND THROAT WITH OR WITHOUT PRESENCE OF ULCERS (sore throat, sores in mouth, or a toothache) UNUSUAL RASH, SWELLING OR PAIN  UNUSUAL VAGINAL DISCHARGE OR ITCHING   Items with * indicate a potential emergency and should be followed up as soon as possible or go to the Emergency Department if any problems should occur.  Please show the CHEMOTHERAPY ALERT CARD or IMMUNOTHERAPY ALERT CARD at check-in to the  Emergency Department and triage nurse.  Should you have questions after your visit or need to cancel or reschedule your appointment, please contact [facility]  Dept: [phone]  and follow the prompts.  Office hours are 8:00 a.m. to 4:30 p.m. Monday - Friday. Please note that voicemails left after 4:00 p.m. may not be returned until the following business day.  We are closed weekends and major holidays. You have access to a nurse at all times for urgent questions. Please call the main number to the clinic Dept: [phone] and follow the prompts.   For any non-urgent questions, you may also contact your provider using MyChart. We now offer e-Visits for anyone 70 and older to request care online for non-urgent symptoms. For details visit [email or web address].   Also download the MyChart app! Go to the app store, search "MyChart", open the app, select [facility], and log in with your MyChart username and password.  Due to Covid, a mask is required upon entering the hospital/clinic. If you do not have a mask, one will be given to you upon arrival. For doctor visits, patients may have 1 support person aged 25 or older with them. For treatment visits, patients cannot have anyone with them due to current Covid guidelines and our immunocompromised population.

## 2021-06-23 ENCOUNTER — Other Ambulatory Visit: Payer: Self-pay | Admitting: Surgery

## 2021-06-24 NOTE — Progress Notes (Signed)
Location of Breast Cancer: upper-outer quadrant of left breast  Histology per Pathology Report: Invasive Ductal Carcinoma with DCIS  Receptor Status:    Did patient present with symptoms (if so, please note symptoms) or was this found on screening mammography?:  bilateral diagnostic mammography with tomography and bilateral breast ultrasonography at [facility] on 08/28/2020 for follow-up of breast masses that were first evaluated in September of 2019.  Past/Anticipated interventions by surgeon, if any:   12/04/2020  Procedure(s): Bilateral mastectomies Targeted deep left axillary lymph node dissection Left subclavian Port-A-Cath insertion   Surgeon: Coralie Keens, MD  Past/Anticipated interventions by medical oncology, if any: Dr Lindi Adie Chemotherapy      Patient is on Treatment Plan: BREAST ADJUVANT DOSE DENSE AC Q14D / PACLITAXEL Q7D    Lymphedema issues, if any:  no    Pain issues, if any:  neuropathy in hands and feet intermittent, tingling, and numbness  SAFETY ISSUES: Prior radiation? no Pacemaker/ICD? no Possible current pregnancy?no, chemotherapy Is the patient on methotrexate? no  Current Complaints / other details:  none    Vitals:   06/30/21 0912  BP: 134/87  Pulse: (!) 105  Resp: 20  Temp: (!) 97 F (36.1 C)  SpO2: 99%  Weight: 225 lb 6.4 oz (102.2 kg)  Height: '4\' 11"'$  (1.499 m)

## 2021-06-26 NOTE — Progress Notes (Signed)
Radiation Oncology         [phone] ________________________________  Name: Jennifer Davis MRN: [id number]  Date: 06/30/2021  DOB: April 19, 1976  Re-Evaluation Note  CC: Wenda Low, MD  Nicholas Lose, MD    ICD-10-CM   1. Malignant neoplasm of upper-outer quadrant of left breast in female, estrogen receptor positive ([place])  C50.412 Ambulatory referral to Social Work   Z17.0       Diagnosis:   Stage IA (pT1c, pN1a, cM0) Left Breast UOQ, Invasive Ductal Carcinoma, ER+ / PR+ / Her2-, Grade 2  Narrative:  The patient returns today to discuss radiation treatment options. She was seen in the multidisciplinary breast clinic on 09/25/20.   Since consultation, she underwent genetic testing on 09/25/20. Results received on 10/02/20 were negative . The patient additionally received mammaprint testing collected on 09/17/20; results revealed the patient to be high risk luminal type-B.    She opted to proceed with bilateral mastectomies with left axillary node biopsies on 12/04/20 under the care of Dr. [name]. Pathology from left breast mastectomy revealed: grade 2 invasive ductal carcinoma measuring 1.5 cm; intermediate grade DCIS; and fibrocystic changes with usual ductal hyperplasia, adenosis, apocrine metaplasia, and intraductal papilloma. Results from right breast mastectomy revealed fibrocystic changes with usual ductal hyperplasia, with no carcinoma identified. Biopsies of left axillary lymph nodes revealed metastatic carcinoma to 3/19 nodes, with the largest focus of metastatic carcinoma measuring 1.5 cm. Prognostic indicators significant for ER: 90%, positive, with moderate staining intensity, and PR: 60%, positive, with strong staining intensity; Her2: negative; Ki67; 25%. The patient additionally underwent bilateral breast placement of tissue expanders and Flex HD under Dr. Marla Roe following bilateral mastectomy.   Unfortunately, the patient reported a fever of 100.7 and swelling of the  right breast on 12/21/20 and 12/22/20. The patient presented to [facility], PA-C, on 12/31/20 with further significant right breast swelling and erythema. Upon examination of mastectomy sites, no signs of infection were seen; 45 cc of saline was removed from the right tissue expander, and the patient was prescribed doxycycline. Patient subsequently returned to the operating room on 01/04/2021 after her right tissue expander became exposed; she underwent removal of the tissue expander and placement of new expander at this time.   The patient developed right breast seroma after repeat expander placement. She proceeded to undergo removal of right breast seroma and Flex HD on 03/19/21 under the care of Dr. Marla Roe. The patient again experienced right breast mastectomy site complications and underwent excision of new right breast wound on 04/10/21. Per Dr. Elenor Quinones procedure note, the patients recurring healing complications are likely due to the patient undergoing chemotherapy.   She has been treated with adjuvant chemotherapy under the care of Dr. Lindi Adie starting on 01/22/21. Patients treatment plan consisting of: adjuvant chemotherapy with weekly Taxol after completing 4 cycles of Adriamycin and Cytoxan.  She has recently completed her adjuvant radiation therapy and is now ready to proceed with radiation treatments.  She denies any pain along the left arm or shoulder area.  She denies any problems with swelling in the left arm or hand.   Allergies:  is allergic to bee venom and other.  Meds: Current Outpatient Medications  Medication Sig Dispense Refill   acetaminophen (TYLENOL) 500 MG tablet TAKE 1 TABLET (500 MG TOTAL) BY MOUTH EVERY 6 (SIX) HOURS AS NEEDED. FOR USE AFTER SURGERY (Patient taking differently: Take 500 mg by mouth every 6 (six) hours as needed for mild pain, fever or headache.) 30 tablet  0   budesonide-formoterol (SYMBICORT) 160-4.5 MCG/ACT inhaler INHALE 2 PUFFS BY MOUTH 2  TIMES A DAY 10.2 g 5   busPIRone (BUSPAR) 30 MG tablet TAKE 1 TABLET (30 MG TOTAL) BY MOUTH 2 (TWO) TIMES DAILY. 60 tablet 5   cetirizine (ZYRTEC) 10 MG tablet Take 10 mg by mouth daily.     DULoxetine (CYMBALTA) 60 MG capsule Take 2 capsules (120 mg total) by mouth daily. 60 capsule 0   EPINEPHrine (EPIPEN 2-PAK) 0.3 mg/0.3 mL IJ SOAJ injection Inject 0.3 mLs (0.3 mg total) into the muscle once as needed for up to 1 dose (for severe allergic reaction). CAll 911 immediately if you have to use this medicine 1 each 1   ibuprofen (ADVIL) 600 MG tablet TAKE 1 TABLET (600 MG TOTAL) BY MOUTH EVERY 6 (SIX) HOURS AS NEEDED FOR MILD PAIN OR MODERATE PAIN. FOR USE AFTER SURGERY 30 tablet 0   lidocaine-prilocaine (EMLA) cream APPLY TO AFFECTED AREA ONCE 30 g 3   LORazepam (ATIVAN) 0.5 MG tablet Take 0.5 mg by mouth at bedtime as needed (nausea).     montelukast (SINGULAIR) 10 MG tablet Take 10 mg by mouth at bedtime.  5   Multiple Vitamin (MULTIVITAMIN WITH MINERALS) TABS tablet Take 1 tablet by mouth daily.     PROAIR HFA 108 (90 Base) MCG/ACT inhaler Inhale 2 puffs into the lungs as needed for wheezing or shortness of breath.  0   prochlorperazine (COMPAZINE) 10 MG tablet TAKE 1 TABLET (10 MG TOTAL) BY MOUTH EVERY 6 (SIX) HOURS AS NEEDED (NAUSEA OR VOMITING). 30 tablet 1   diazepam (VALIUM) 2 MG tablet TAKE 1 TABLET (2 MG TOTAL) BY MOUTH EVERY 12 (TWELVE) HOURS AS NEEDED FOR UP TO 15 DAYS FOR MUSCLE SPASMS. (Patient not taking: Reported on 06/30/2021) 30 tablet 0   loratadine (CLARITIN) 10 MG tablet Take 10 mg by mouth daily. (Patient not taking: Reported on 06/30/2021)     No current facility-administered medications for this encounter.    Physical Findings: The patient is in no acute distress. Patient is alert and oriented.  height is _0  (1.499 m) and weight is 225 lb 6.4 oz (102.2 kg). Her temperature is 97 F (36.1 C) (abnormal). Her blood pressure is 134/87 and her pulse is 105 (abnormal). Her  respiration is 20 and oxygen saturation is 99%.  No significant changes. Lungs are clear to auscultation bilaterally. Heart has regular rate and rhythm. No palpable cervical, supraclavicular, or axillary adenopathy. Abdomen soft, non-tender, normal bowel sounds. Right chest wall area shows a mastectomy scar.  No signs of drainage or infection.  The left chest wall area shows a tissue expander in place mastectomy scar.  No palpable or visible signs of recurrence.  No signs of infection along the left side.   Lab Findings: Lab Results  Component Value Date   WBC 7.1 06/19/2021   HGB 9.9 (L) 06/19/2021   HCT 31.7 (L) 06/19/2021   MCV 89.0 06/19/2021   PLT 465 (H) 06/19/2021    Radiographic Findings: No results found.  Impression:  Stage IA (pT1c, pN1a, cM0) Left Breast UOQ, Invasive Ductal Carcinoma, ER+ / PR+ / Her2-, Grade 2  Patient will be a good candidate for postmastectomy radiation therapy along the left side.  As above she was found to have 3 positive lymph nodes increasing her risk for local regional recurrence.  I discussed the course of treatment side effects and potential toxicities of postmastectomy radiation therapy.  Patient appears to understand  and wishes to proceed with planned course of treatment.  Plan:  Patient is scheduled for CT simulation tomorrow.  Anticipate 6 weeks of postmastectomy radiation therapy.  Radiation fields  initially for first 5 weeks will cover the left chest wall and axillary region.  Last week of radiation therapy will be directed at the mastectomy scar.  Cardiac sparing techniques will be used if necessary.  -----------------------------------  Blair Promise, PhD, MD  This document serves as a record of services personally performed by Gery Pray, MD. It was created on his behalf by Roney Mans, a trained medical scribe. The creation of this record is based on the scribe's personal observations and the provider's statements to them. This  document has been checked and approved by the attending provider.

## 2021-06-30 ENCOUNTER — Other Ambulatory Visit (HOSPITAL_COMMUNITY): Payer: Self-pay

## 2021-06-30 ENCOUNTER — Encounter: Payer: Self-pay | Admitting: Radiation Oncology

## 2021-06-30 ENCOUNTER — Other Ambulatory Visit: Payer: Self-pay

## 2021-06-30 ENCOUNTER — Other Ambulatory Visit: Payer: Self-pay | Admitting: Physician Assistant

## 2021-06-30 ENCOUNTER — Ambulatory Visit
Admission: RE | Admit: 2021-06-30 | Discharge: 2021-06-30 | Disposition: A | Discharge status: Home / Self Care | Payer: 59 | Source: Ambulatory Visit | Attending: Radiation Oncology | Admitting: Radiation Oncology

## 2021-06-30 VITALS — BP 134/87 | HR 105 | Temp 97.0°F | Resp 20 | Ht 59.0 in | Wt 225.4 lb

## 2021-06-30 DIAGNOSIS — C50412 Malignant neoplasm of upper-outer quadrant of left female breast: Secondary | ICD-10-CM

## 2021-06-30 DIAGNOSIS — C773 Secondary and unspecified malignant neoplasm of axilla and upper limb lymph nodes: Secondary | ICD-10-CM | POA: Diagnosis not present

## 2021-06-30 DIAGNOSIS — Z79899 Other long term (current) drug therapy: Secondary | ICD-10-CM | POA: Insufficient documentation

## 2021-06-30 DIAGNOSIS — Z170 Estrogen receptor positive status [ER+]: Secondary | ICD-10-CM | POA: Insufficient documentation

## 2021-06-30 DIAGNOSIS — Z923 Personal history of irradiation: Secondary | ICD-10-CM | POA: Insufficient documentation

## 2021-06-30 DIAGNOSIS — Z9013 Acquired absence of bilateral breasts and nipples: Secondary | ICD-10-CM | POA: Insufficient documentation

## 2021-06-30 DIAGNOSIS — Z7951 Long term (current) use of inhaled steroids: Secondary | ICD-10-CM | POA: Insufficient documentation

## 2021-06-30 NOTE — Progress Notes (Signed)
See MD note for nursing evaluation. °

## 2021-07-01 ENCOUNTER — Encounter: Payer: Self-pay | Admitting: Licensed Clinical Social Worker

## 2021-07-01 ENCOUNTER — Other Ambulatory Visit (HOSPITAL_COMMUNITY): Payer: Self-pay

## 2021-07-01 ENCOUNTER — Ambulatory Visit
Admission: RE | Admit: 2021-07-01 | Discharge: 2021-07-01 | Disposition: A | Discharge status: Home / Self Care | Payer: 59 | Source: Ambulatory Visit | Attending: Radiation Oncology | Admitting: Radiation Oncology

## 2021-07-01 DIAGNOSIS — Z510 Encounter for antineoplastic radiation therapy: Secondary | ICD-10-CM | POA: Insufficient documentation

## 2021-07-01 DIAGNOSIS — Z170 Estrogen receptor positive status [ER+]: Secondary | ICD-10-CM | POA: Diagnosis not present

## 2021-07-01 DIAGNOSIS — C50412 Malignant neoplasm of upper-outer quadrant of left female breast: Secondary | ICD-10-CM | POA: Insufficient documentation

## 2021-07-01 MED ORDER — DULOXETINE HCL 60 MG PO CPEP
120.0000 mg | ORAL_CAPSULE | Freq: Every day | ORAL | 0 refills | Status: DC
  Filled 2021-07-01: qty 60, 30d supply, fill #0

## 2021-07-01 NOTE — Progress Notes (Signed)
[place] Psychosocial Distress Screening Clinical Social Work  Clinical Social Work was referred by distress screening protocol.  The patient scored a 5 on the Psychosocial Distress Thermometer which indicates moderate distress. Clinical Social Worker contacted patient by phone to assess for distress and other psychosocial needs.   Patient reports feeling fairly well overall now that she is healing better. She is getting ready to start radiation and is trying to keep a positive outlook. CSW reviewed support services available should patient want and/or need additional support.  ONCBCN DISTRESS SCREENING 06/30/2021  Screening Type Initial Screening  Distress experienced in past week (1-10) 5  Practical problem type   Emotional problem type Nervousness/Anxiety;Adjusting to illness;Adjusting to appearance changes  Physical Problem type     Clinical Social Worker follow up needed: No.  If yes, follow up plan:  Kijuana Ruppel E Angelo Prindle, LCSW

## 2021-07-07 ENCOUNTER — Encounter: Payer: Self-pay | Admitting: *Deleted

## 2021-07-08 ENCOUNTER — Encounter: Payer: Self-pay | Admitting: Physician Assistant

## 2021-07-08 ENCOUNTER — Other Ambulatory Visit: Payer: Self-pay

## 2021-07-08 ENCOUNTER — Ambulatory Visit: Admission: RE | Admit: 2021-07-08 | Payer: 59 | Source: Ambulatory Visit | Admitting: Radiation Oncology

## 2021-07-08 ENCOUNTER — Ambulatory Visit (INDEPENDENT_AMBULATORY_CARE_PROVIDER_SITE_OTHER): Payer: 59 | Admitting: Physician Assistant

## 2021-07-08 ENCOUNTER — Other Ambulatory Visit (HOSPITAL_COMMUNITY): Payer: Self-pay

## 2021-07-08 DIAGNOSIS — F411 Generalized anxiety disorder: Secondary | ICD-10-CM | POA: Diagnosis not present

## 2021-07-08 DIAGNOSIS — Z510 Encounter for antineoplastic radiation therapy: Secondary | ICD-10-CM | POA: Diagnosis not present

## 2021-07-08 DIAGNOSIS — F3342 Major depressive disorder, recurrent, in full remission: Secondary | ICD-10-CM | POA: Diagnosis not present

## 2021-07-08 DIAGNOSIS — C50412 Malignant neoplasm of upper-outer quadrant of left female breast: Secondary | ICD-10-CM | POA: Diagnosis not present

## 2021-07-08 DIAGNOSIS — Z170 Estrogen receptor positive status [ER+]: Secondary | ICD-10-CM | POA: Diagnosis not present

## 2021-07-08 MED ORDER — DULOXETINE HCL 60 MG PO CPEP
120.0000 mg | ORAL_CAPSULE | Freq: Every day | ORAL | 1 refills | Status: DC
  Filled 2021-07-08 – 2021-08-07 (×2): qty 180, 90d supply, fill #0

## 2021-07-08 NOTE — Progress Notes (Signed)
[facility]  Patient ID: Jennifer Davis,  MRN: [id number]  PCP: Wenda Low, MD  Date of Evaluation: 07/08/2021 Time spent:20 minutes   Chief Complaint:  Chief Complaint   Follow-up; Anxiety; Depression    HISTORY/CURRENT STATUS: HPI For routine med check.   Was diagnosed with breast cancer in November of last year.  See review of systems.  She is doing well physically in regards to that, she has had complications with the right mastectomy site, with plans to have reconstructive surgery but because of the complications she will see the plastic surgeon in the next couple of months and go from there.  Doing well as far as psych meds go. Patient denies loss of interest in usual activities and is able to enjoy things.  Denies decreased energy or motivation.  Appetite has not changed.  No extreme sadness, tearfulness, or feelings of hopelessness. Does have problems with memory d/t chemotherapy she thinks but otherwise no problems with that.  Denies suicidal or homicidal thoughts.  No anxiety to speak of.  She accidentally missed taking the BuSpar on a couple of different occasions.  One thing led to another and she realized she did not take it for quite a while and she did fine without it.  Does not feel like she needs it or the gabapentin now.  Sleeps well most of the time.  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  She was taking classes, prerequisites for nursing school but decided not to take any this whole year because of her treatment.  Denies dizziness, syncope, seizures, numbness, tingling, tremor, tics, unsteady gait, slurred speech, confusion. Denies muscle or joint pain, stiffness, or dystonia.  Individual Medical History/ Review of Systems: Changes? :Yes    Dx with breast CA, finished chemo a few weeks ago, will start XRT tomorrow for 5 weeks. S/p double mastectomy   Past medications for mental health diagnoses include: Lexapro, Prozac wasn't effective, Xanax,  Buspar worked but d/c when didn't need it.  Allergies: Bee venom and Other  Current Medications:  Current Outpatient Medications:    acetaminophen (TYLENOL) 500 MG tablet, TAKE 1 TABLET (500 MG TOTAL) BY MOUTH EVERY 6 (SIX) HOURS AS NEEDED. FOR USE AFTER SURGERY (Patient taking differently: Take 500 mg by mouth every 6 (six) hours as needed for mild pain, fever or headache.), Disp: 30 tablet, Rfl: 0   budesonide-formoterol (SYMBICORT) 160-4.5 MCG/ACT inhaler, INHALE 2 PUFFS BY MOUTH 2 TIMES A DAY, Disp: 10.2 g, Rfl: 5   cetirizine (ZYRTEC) 10 MG tablet, Take 10 mg by mouth daily., Disp: , Rfl:    EPINEPHrine (EPIPEN 2-PAK) 0.3 mg/0.3 mL IJ SOAJ injection, Inject 0.3 mLs (0.3 mg total) into the muscle once as needed for up to 1 dose (for severe allergic reaction). CAll 911 immediately if you have to use this medicine, Disp: 1 each, Rfl: 1   ibuprofen (ADVIL) 600 MG tablet, TAKE 1 TABLET (600 MG TOTAL) BY MOUTH EVERY 6 (SIX) HOURS AS NEEDED FOR MILD PAIN OR MODERATE PAIN. FOR USE AFTER SURGERY, Disp: 30 tablet, Rfl: 0   lidocaine-prilocaine (EMLA) cream, APPLY TO AFFECTED AREA ONCE, Disp: 30 g, Rfl: 3   LORazepam (ATIVAN) 0.5 MG tablet, Take 0.5 mg by mouth at bedtime as needed (nausea)., Disp: , Rfl:    montelukast (SINGULAIR) 10 MG tablet, Take 10 mg by mouth at bedtime., Disp: , Rfl: 5   Multiple Vitamin (MULTIVITAMIN WITH MINERALS) TABS tablet, Take 1 tablet by mouth daily.,  Disp: , Rfl:    PROAIR HFA 108 (90 Base) MCG/ACT inhaler, Inhale 2 puffs into the lungs as needed for wheezing or shortness of breath., Disp: , Rfl: 0   busPIRone (BUSPAR) 30 MG tablet, TAKE 1 TABLET (30 MG TOTAL) BY MOUTH 2 (TWO) TIMES DAILY. (Patient not taking: Reported on 07/08/2021), Disp: 60 tablet, Rfl: 5   DULoxetine (CYMBALTA) 60 MG capsule, Take 2 capsules (120 mg total) by mouth daily., Disp: 180 capsule, Rfl: 1   loratadine (CLARITIN) 10 MG tablet, Take 10 mg by mouth daily. (Patient not taking: No sig reported),  Disp: , Rfl:    prochlorperazine (COMPAZINE) 10 MG tablet, TAKE 1 TABLET (10 MG TOTAL) BY MOUTH EVERY 6 (SIX) HOURS AS NEEDED (NAUSEA OR VOMITING). (Patient not taking: Reported on 07/08/2021), Disp: 30 tablet, Rfl: 1 Medication Side Effects: none  Family Medical/ Social History: Changes? no  MENTAL HEALTH EXAM:  There were no vitals taken for this visit.There is no height or weight on file to calculate BMI.  General Appearance: Casual, Well Groomed, Obese, and wearing a turban   Eye Contact:  Good  Speech:  Clear and Coherent and Normal Rate  Volume:  Normal  Mood:  Euthymic  Affect:  Congruent  Thought Process:  Goal Directed and Descriptions of Associations: Intact  Orientation:  Full (Time, Place, and Person)  Thought Content: Logical   Suicidal Thoughts:  No  Homicidal Thoughts:  No  Memory:  WNL  Judgement:  Good  Insight:  Good  Psychomotor Activity:  Normal  Concentration:  Concentration: Good and Attention Span: Good  Recall:  Good  Fund of Knowledge: Good  Language: Good  Assets:  Desire for Improvement  ADL's:  Intact  Cognition: WNL  Prognosis:  Good    DIAGNOSES:    ICD-10-CM   1. Recurrent major depressive disorder, in full remission ([place])  F33.42     2. Generalized anxiety disorder  F41.1     3. Malignant neoplasm of upper-outer quadrant of left breast in female, estrogen receptor positive ([place])  C50.412    Z17.0        Receiving Psychotherapy: No    RECOMMENDATIONS:  PDMP reviewed. I provided 20 minutes of face to face time during this encounter, including time spent before and after the visit in records review, medical decision making, and charting.  She seems to be doing very well with the diagnosis and treatment of breast cancer. I am glad to see her doing well with mental health medications. Continue Cymbalta 60 mg, 2 qd. Return in 6 months.   Donnal Moat, PA-C

## 2021-07-09 ENCOUNTER — Ambulatory Visit
Admission: RE | Admit: 2021-07-09 | Discharge: 2021-07-09 | Disposition: A | Discharge status: Home / Self Care | Payer: 59 | Source: Ambulatory Visit | Attending: Radiation Oncology | Admitting: Radiation Oncology

## 2021-07-09 ENCOUNTER — Other Ambulatory Visit: Payer: Self-pay

## 2021-07-09 DIAGNOSIS — Z510 Encounter for antineoplastic radiation therapy: Secondary | ICD-10-CM | POA: Diagnosis not present

## 2021-07-09 DIAGNOSIS — C50412 Malignant neoplasm of upper-outer quadrant of left female breast: Secondary | ICD-10-CM | POA: Diagnosis not present

## 2021-07-09 DIAGNOSIS — Z170 Estrogen receptor positive status [ER+]: Secondary | ICD-10-CM | POA: Diagnosis not present

## 2021-07-10 ENCOUNTER — Ambulatory Visit
Admission: RE | Admit: 2021-07-10 | Discharge: 2021-07-10 | Disposition: A | Discharge status: Home / Self Care | Payer: 59 | Source: Ambulatory Visit | Attending: Radiation Oncology | Admitting: Radiation Oncology

## 2021-07-10 DIAGNOSIS — Z01419 Encounter for gynecological examination (general) (routine) without abnormal findings: Secondary | ICD-10-CM | POA: Diagnosis not present

## 2021-07-10 DIAGNOSIS — Z170 Estrogen receptor positive status [ER+]: Secondary | ICD-10-CM | POA: Insufficient documentation

## 2021-07-10 DIAGNOSIS — C50412 Malignant neoplasm of upper-outer quadrant of left female breast: Secondary | ICD-10-CM | POA: Insufficient documentation

## 2021-07-10 DIAGNOSIS — B373 Candidiasis of vulva and vagina: Secondary | ICD-10-CM | POA: Diagnosis not present

## 2021-07-10 DIAGNOSIS — N898 Other specified noninflammatory disorders of vagina: Secondary | ICD-10-CM | POA: Diagnosis not present

## 2021-07-11 ENCOUNTER — Other Ambulatory Visit: Payer: Self-pay

## 2021-07-11 ENCOUNTER — Ambulatory Visit
Admission: RE | Admit: 2021-07-11 | Discharge: 2021-07-11 | Disposition: A | Discharge status: Home / Self Care | Payer: 59 | Source: Ambulatory Visit | Attending: Radiation Oncology | Admitting: Radiation Oncology

## 2021-07-11 DIAGNOSIS — C50412 Malignant neoplasm of upper-outer quadrant of left female breast: Secondary | ICD-10-CM | POA: Diagnosis not present

## 2021-07-11 DIAGNOSIS — Z170 Estrogen receptor positive status [ER+]: Secondary | ICD-10-CM | POA: Diagnosis not present

## 2021-07-15 ENCOUNTER — Other Ambulatory Visit (HOSPITAL_COMMUNITY): Payer: Self-pay

## 2021-07-15 ENCOUNTER — Ambulatory Visit: Payer: 59

## 2021-07-15 MED ORDER — FLUCONAZOLE 150 MG PO TABS
ORAL_TABLET | ORAL | 1 refills | Status: DC
  Filled 2021-07-15: qty 1, 1d supply, fill #0

## 2021-07-16 ENCOUNTER — Ambulatory Visit
Admission: RE | Admit: 2021-07-16 | Discharge: 2021-07-16 | Disposition: A | Discharge status: Home / Self Care | Payer: 59 | Source: Ambulatory Visit | Attending: Radiation Oncology | Admitting: Radiation Oncology

## 2021-07-16 ENCOUNTER — Other Ambulatory Visit: Payer: Self-pay

## 2021-07-16 DIAGNOSIS — Z170 Estrogen receptor positive status [ER+]: Secondary | ICD-10-CM | POA: Diagnosis not present

## 2021-07-16 DIAGNOSIS — C50412 Malignant neoplasm of upper-outer quadrant of left female breast: Secondary | ICD-10-CM | POA: Diagnosis not present

## 2021-07-16 MED ORDER — ALRA NON-METALLIC DEODORANT (RAD-ONC)
1.0000 "application " | Freq: Once | TOPICAL | Status: AC
  Administered 2021-07-16: 1 via TOPICAL

## 2021-07-16 MED ORDER — RADIAPLEXRX EX GEL: Freq: Once | CUTANEOUS | Status: AC

## 2021-07-16 NOTE — Progress Notes (Signed)
Pt here for patient teaching.  Pt given Radiation and You booklet, Managing Acute Radiation Side Effects for Head and Neck Cancer handout, skin care instructions, Alra deodorant, and Radiaplex gel.  Reviewed areas of pertinence such as diarrhea, fatigue, hair loss, nausea and vomiting, sexual and fertility changes, skin changes, breast tenderness, and breast swelling . Pt able to give teach back of to pat skin, use unscented/gentle soap, use baby wipes, have Imodium on hand, and drink plenty of water,apply Radiaplex bid, avoid applying anything to skin within 4 hours of treatment, and avoid wearing an under wire bra. Pt demonstrated understanding of information given and will contact nursing with any questions or concerns.     [email or web address]

## 2021-07-17 ENCOUNTER — Other Ambulatory Visit: Payer: Self-pay

## 2021-07-17 ENCOUNTER — Ambulatory Visit
Admission: RE | Admit: 2021-07-17 | Discharge: 2021-07-17 | Disposition: A | Discharge status: Home / Self Care | Payer: 59 | Source: Ambulatory Visit | Attending: Radiation Oncology | Admitting: Radiation Oncology

## 2021-07-17 ENCOUNTER — Encounter: Payer: Self-pay | Admitting: Hematology and Oncology

## 2021-07-17 DIAGNOSIS — Z170 Estrogen receptor positive status [ER+]: Secondary | ICD-10-CM | POA: Diagnosis not present

## 2021-07-17 DIAGNOSIS — C50412 Malignant neoplasm of upper-outer quadrant of left female breast: Secondary | ICD-10-CM | POA: Diagnosis not present

## 2021-07-18 ENCOUNTER — Ambulatory Visit
Admission: RE | Admit: 2021-07-18 | Discharge: 2021-07-18 | Disposition: A | Discharge status: Home / Self Care | Payer: 59 | Source: Ambulatory Visit | Attending: Radiation Oncology | Admitting: Radiation Oncology

## 2021-07-18 ENCOUNTER — Encounter: Payer: Self-pay | Admitting: Hematology and Oncology

## 2021-07-18 DIAGNOSIS — Z170 Estrogen receptor positive status [ER+]: Secondary | ICD-10-CM | POA: Diagnosis not present

## 2021-07-18 DIAGNOSIS — C50412 Malignant neoplasm of upper-outer quadrant of left female breast: Secondary | ICD-10-CM | POA: Diagnosis not present

## 2021-07-21 ENCOUNTER — Other Ambulatory Visit: Payer: Self-pay

## 2021-07-21 ENCOUNTER — Ambulatory Visit
Admission: RE | Admit: 2021-07-21 | Discharge: 2021-07-21 | Disposition: A | Discharge status: Home / Self Care | Payer: 59 | Source: Ambulatory Visit | Attending: Radiation Oncology | Admitting: Radiation Oncology

## 2021-07-21 DIAGNOSIS — Z170 Estrogen receptor positive status [ER+]: Secondary | ICD-10-CM | POA: Diagnosis not present

## 2021-07-21 DIAGNOSIS — C50412 Malignant neoplasm of upper-outer quadrant of left female breast: Secondary | ICD-10-CM | POA: Diagnosis not present

## 2021-07-22 ENCOUNTER — Ambulatory Visit
Admission: RE | Admit: 2021-07-22 | Discharge: 2021-07-22 | Disposition: A | Discharge status: Home / Self Care | Payer: 59 | Source: Ambulatory Visit | Attending: Radiation Oncology | Admitting: Radiation Oncology

## 2021-07-22 DIAGNOSIS — C50412 Malignant neoplasm of upper-outer quadrant of left female breast: Secondary | ICD-10-CM | POA: Diagnosis not present

## 2021-07-22 DIAGNOSIS — Z170 Estrogen receptor positive status [ER+]: Secondary | ICD-10-CM | POA: Diagnosis not present

## 2021-07-23 ENCOUNTER — Other Ambulatory Visit: Payer: Self-pay

## 2021-07-23 ENCOUNTER — Ambulatory Visit
Admission: RE | Admit: 2021-07-23 | Discharge: 2021-07-23 | Disposition: A | Discharge status: Home / Self Care | Payer: 59 | Source: Ambulatory Visit | Attending: Radiation Oncology | Admitting: Radiation Oncology

## 2021-07-23 DIAGNOSIS — C50412 Malignant neoplasm of upper-outer quadrant of left female breast: Secondary | ICD-10-CM | POA: Diagnosis not present

## 2021-07-23 DIAGNOSIS — Z170 Estrogen receptor positive status [ER+]: Secondary | ICD-10-CM | POA: Diagnosis not present

## 2021-07-24 ENCOUNTER — Telehealth: Payer: Self-pay

## 2021-07-24 ENCOUNTER — Ambulatory Visit
Admission: RE | Admit: 2021-07-24 | Discharge: 2021-07-24 | Disposition: A | Discharge status: Home / Self Care | Payer: 59 | Source: Ambulatory Visit | Attending: Radiation Oncology | Admitting: Radiation Oncology

## 2021-07-24 DIAGNOSIS — Z170 Estrogen receptor positive status [ER+]: Secondary | ICD-10-CM | POA: Diagnosis not present

## 2021-07-24 DIAGNOSIS — C50412 Malignant neoplasm of upper-outer quadrant of left female breast: Secondary | ICD-10-CM | POA: Diagnosis not present

## 2021-07-24 NOTE — Telephone Encounter (Signed)
Spoke with Jennifer Davis regarding the ADA Accommodation form received. She stated that she does need the form completed and returned. No further needs verbalized at this time

## 2021-07-25 ENCOUNTER — Ambulatory Visit
Admission: RE | Admit: 2021-07-25 | Discharge: 2021-07-25 | Disposition: A | Discharge status: Home / Self Care | Payer: 59 | Source: Ambulatory Visit | Attending: Radiation Oncology | Admitting: Radiation Oncology

## 2021-07-25 ENCOUNTER — Other Ambulatory Visit: Payer: Self-pay

## 2021-07-25 DIAGNOSIS — Z170 Estrogen receptor positive status [ER+]: Secondary | ICD-10-CM | POA: Diagnosis not present

## 2021-07-25 DIAGNOSIS — C50412 Malignant neoplasm of upper-outer quadrant of left female breast: Secondary | ICD-10-CM | POA: Diagnosis not present

## 2021-07-28 ENCOUNTER — Ambulatory Visit
Admission: RE | Admit: 2021-07-28 | Discharge: 2021-07-28 | Disposition: A | Discharge status: Home / Self Care | Payer: 59 | Source: Ambulatory Visit | Attending: Radiation Oncology | Admitting: Radiation Oncology

## 2021-07-28 ENCOUNTER — Other Ambulatory Visit: Payer: Self-pay

## 2021-07-28 DIAGNOSIS — Z170 Estrogen receptor positive status [ER+]: Secondary | ICD-10-CM | POA: Diagnosis not present

## 2021-07-28 DIAGNOSIS — C50412 Malignant neoplasm of upper-outer quadrant of left female breast: Secondary | ICD-10-CM | POA: Diagnosis not present

## 2021-07-29 ENCOUNTER — Ambulatory Visit
Admission: RE | Admit: 2021-07-29 | Discharge: 2021-07-29 | Disposition: A | Discharge status: Home / Self Care | Payer: 59 | Source: Ambulatory Visit | Attending: Radiation Oncology | Admitting: Radiation Oncology

## 2021-07-29 ENCOUNTER — Other Ambulatory Visit: Payer: Self-pay

## 2021-07-29 DIAGNOSIS — C50412 Malignant neoplasm of upper-outer quadrant of left female breast: Secondary | ICD-10-CM | POA: Diagnosis not present

## 2021-07-29 DIAGNOSIS — Z170 Estrogen receptor positive status [ER+]: Secondary | ICD-10-CM | POA: Diagnosis not present

## 2021-07-30 ENCOUNTER — Other Ambulatory Visit: Payer: Self-pay

## 2021-07-30 ENCOUNTER — Ambulatory Visit
Admission: RE | Admit: 2021-07-30 | Discharge: 2021-07-30 | Disposition: A | Discharge status: Home / Self Care | Payer: 59 | Source: Ambulatory Visit | Attending: Radiation Oncology | Admitting: Radiation Oncology

## 2021-07-30 DIAGNOSIS — Z170 Estrogen receptor positive status [ER+]: Secondary | ICD-10-CM | POA: Diagnosis not present

## 2021-07-30 DIAGNOSIS — C50412 Malignant neoplasm of upper-outer quadrant of left female breast: Secondary | ICD-10-CM | POA: Diagnosis not present

## 2021-07-31 ENCOUNTER — Telehealth: Payer: Self-pay

## 2021-07-31 ENCOUNTER — Other Ambulatory Visit: Payer: Self-pay

## 2021-07-31 ENCOUNTER — Ambulatory Visit
Admission: RE | Admit: 2021-07-31 | Discharge: 2021-07-31 | Disposition: A | Discharge status: Home / Self Care | Payer: 59 | Source: Ambulatory Visit | Attending: Radiation Oncology | Admitting: Radiation Oncology

## 2021-07-31 DIAGNOSIS — C50412 Malignant neoplasm of upper-outer quadrant of left female breast: Secondary | ICD-10-CM | POA: Diagnosis not present

## 2021-07-31 DIAGNOSIS — Z170 Estrogen receptor positive status [ER+]: Secondary | ICD-10-CM | POA: Diagnosis not present

## 2021-07-31 NOTE — Telephone Encounter (Signed)
Notified Patient  of completion of Accommodation Form by Radiation Nurse. Copy mailed to patient as requested. Completed Form forwarded to [employer] as requested

## 2021-08-01 ENCOUNTER — Ambulatory Visit
Admission: RE | Admit: 2021-08-01 | Discharge: 2021-08-01 | Disposition: A | Discharge status: Home / Self Care | Payer: 59 | Source: Ambulatory Visit | Attending: Radiation Oncology | Admitting: Radiation Oncology

## 2021-08-01 DIAGNOSIS — Z170 Estrogen receptor positive status [ER+]: Secondary | ICD-10-CM | POA: Diagnosis not present

## 2021-08-01 DIAGNOSIS — C50412 Malignant neoplasm of upper-outer quadrant of left female breast: Secondary | ICD-10-CM | POA: Diagnosis not present

## 2021-08-04 ENCOUNTER — Ambulatory Visit
Admission: RE | Admit: 2021-08-04 | Discharge: 2021-08-04 | Disposition: A | Discharge status: Home / Self Care | Payer: 59 | Source: Ambulatory Visit | Attending: Radiation Oncology | Admitting: Radiation Oncology

## 2021-08-04 ENCOUNTER — Other Ambulatory Visit (HOSPITAL_COMMUNITY): Payer: Self-pay

## 2021-08-04 ENCOUNTER — Other Ambulatory Visit: Payer: Self-pay

## 2021-08-04 DIAGNOSIS — C50412 Malignant neoplasm of upper-outer quadrant of left female breast: Secondary | ICD-10-CM | POA: Diagnosis not present

## 2021-08-04 DIAGNOSIS — T781XXA Other adverse food reactions, not elsewhere classified, initial encounter: Secondary | ICD-10-CM | POA: Diagnosis not present

## 2021-08-04 DIAGNOSIS — J3089 Other allergic rhinitis: Secondary | ICD-10-CM | POA: Diagnosis not present

## 2021-08-04 DIAGNOSIS — J301 Allergic rhinitis due to pollen: Secondary | ICD-10-CM | POA: Diagnosis not present

## 2021-08-04 DIAGNOSIS — J209 Acute bronchitis, unspecified: Secondary | ICD-10-CM | POA: Diagnosis not present

## 2021-08-04 DIAGNOSIS — Z170 Estrogen receptor positive status [ER+]: Secondary | ICD-10-CM | POA: Diagnosis not present

## 2021-08-04 DIAGNOSIS — J3081 Allergic rhinitis due to animal (cat) (dog) hair and dander: Secondary | ICD-10-CM | POA: Diagnosis not present

## 2021-08-04 MED ORDER — ALBUTEROL SULFATE HFA 108 (90 BASE) MCG/ACT IN AERS
2.0000 | INHALATION_SPRAY | RESPIRATORY_TRACT | 0 refills | Status: DC
  Filled 2021-08-04: qty 8.5, 17d supply, fill #0

## 2021-08-04 MED ORDER — EPINEPHRINE 0.3 MG/0.3ML IJ SOAJ
INTRAMUSCULAR | 1 refills | Status: DC
  Filled 2021-08-04: qty 2, 2d supply, fill #0

## 2021-08-04 MED ORDER — AZITHROMYCIN 250 MG PO TABS
ORAL_TABLET | ORAL | 0 refills | Status: DC
  Filled 2021-08-04: qty 6, 5d supply, fill #0

## 2021-08-04 MED FILL — Budesonide-Formoterol Fumarate Dihyd Aerosol 160-4.5 MCG/ACT: RESPIRATORY_TRACT | 30 days supply | Qty: 10.2 | Fill #2 | Status: AC

## 2021-08-05 ENCOUNTER — Ambulatory Visit: Payer: 59 | Admitting: Radiation Oncology

## 2021-08-05 ENCOUNTER — Ambulatory Visit
Admission: RE | Admit: 2021-08-05 | Discharge: 2021-08-05 | Disposition: A | Discharge status: Home / Self Care | Payer: 59 | Source: Ambulatory Visit | Attending: Radiation Oncology | Admitting: Radiation Oncology

## 2021-08-05 DIAGNOSIS — C50412 Malignant neoplasm of upper-outer quadrant of left female breast: Secondary | ICD-10-CM | POA: Diagnosis not present

## 2021-08-05 DIAGNOSIS — Z170 Estrogen receptor positive status [ER+]: Secondary | ICD-10-CM | POA: Diagnosis not present

## 2021-08-06 ENCOUNTER — Other Ambulatory Visit: Payer: Self-pay

## 2021-08-06 ENCOUNTER — Ambulatory Visit
Admission: RE | Admit: 2021-08-06 | Discharge: 2021-08-06 | Disposition: A | Discharge status: Home / Self Care | Payer: 59 | Source: Ambulatory Visit | Attending: Radiation Oncology | Admitting: Radiation Oncology

## 2021-08-06 DIAGNOSIS — C50412 Malignant neoplasm of upper-outer quadrant of left female breast: Secondary | ICD-10-CM | POA: Diagnosis not present

## 2021-08-06 DIAGNOSIS — Z170 Estrogen receptor positive status [ER+]: Secondary | ICD-10-CM | POA: Diagnosis not present

## 2021-08-07 ENCOUNTER — Ambulatory Visit
Admission: RE | Admit: 2021-08-07 | Discharge: 2021-08-07 | Disposition: A | Discharge status: Home / Self Care | Payer: 59 | Source: Ambulatory Visit | Attending: Radiation Oncology | Admitting: Radiation Oncology

## 2021-08-07 ENCOUNTER — Other Ambulatory Visit (HOSPITAL_COMMUNITY): Payer: Self-pay

## 2021-08-07 DIAGNOSIS — Z170 Estrogen receptor positive status [ER+]: Secondary | ICD-10-CM | POA: Diagnosis not present

## 2021-08-07 DIAGNOSIS — C50412 Malignant neoplasm of upper-outer quadrant of left female breast: Secondary | ICD-10-CM | POA: Diagnosis not present

## 2021-08-08 ENCOUNTER — Ambulatory Visit
Admission: RE | Admit: 2021-08-08 | Discharge: 2021-08-08 | Disposition: A | Discharge status: Home / Self Care | Payer: 59 | Source: Ambulatory Visit | Attending: Radiation Oncology | Admitting: Radiation Oncology

## 2021-08-08 ENCOUNTER — Other Ambulatory Visit: Payer: Self-pay

## 2021-08-08 DIAGNOSIS — C50412 Malignant neoplasm of upper-outer quadrant of left female breast: Secondary | ICD-10-CM | POA: Diagnosis not present

## 2021-08-08 DIAGNOSIS — Z170 Estrogen receptor positive status [ER+]: Secondary | ICD-10-CM | POA: Diagnosis not present

## 2021-08-11 ENCOUNTER — Ambulatory Visit: Payer: 59

## 2021-08-11 ENCOUNTER — Encounter: Payer: Self-pay | Admitting: Hematology and Oncology

## 2021-08-11 DIAGNOSIS — Z170 Estrogen receptor positive status [ER+]: Secondary | ICD-10-CM | POA: Insufficient documentation

## 2021-08-11 DIAGNOSIS — C773 Secondary and unspecified malignant neoplasm of axilla and upper limb lymph nodes: Secondary | ICD-10-CM | POA: Diagnosis not present

## 2021-08-11 DIAGNOSIS — C50412 Malignant neoplasm of upper-outer quadrant of left female breast: Secondary | ICD-10-CM | POA: Insufficient documentation

## 2021-08-12 ENCOUNTER — Ambulatory Visit
Admission: RE | Admit: 2021-08-12 | Discharge: 2021-08-12 | Disposition: A | Discharge status: Home / Self Care | Payer: 59 | Source: Ambulatory Visit | Attending: Radiation Oncology | Admitting: Radiation Oncology

## 2021-08-12 ENCOUNTER — Encounter: Payer: Self-pay | Admitting: Hematology and Oncology

## 2021-08-12 ENCOUNTER — Other Ambulatory Visit: Payer: Self-pay

## 2021-08-12 DIAGNOSIS — C50412 Malignant neoplasm of upper-outer quadrant of left female breast: Secondary | ICD-10-CM

## 2021-08-12 DIAGNOSIS — Z170 Estrogen receptor positive status [ER+]: Secondary | ICD-10-CM | POA: Diagnosis not present

## 2021-08-12 DIAGNOSIS — C773 Secondary and unspecified malignant neoplasm of axilla and upper limb lymph nodes: Secondary | ICD-10-CM | POA: Diagnosis not present

## 2021-08-13 ENCOUNTER — Ambulatory Visit
Admission: RE | Admit: 2021-08-13 | Discharge: 2021-08-13 | Disposition: A | Discharge status: Home / Self Care | Payer: 59 | Source: Ambulatory Visit | Attending: Radiation Oncology | Admitting: Radiation Oncology

## 2021-08-13 ENCOUNTER — Other Ambulatory Visit: Payer: Self-pay

## 2021-08-13 DIAGNOSIS — Z170 Estrogen receptor positive status [ER+]: Secondary | ICD-10-CM | POA: Diagnosis not present

## 2021-08-13 DIAGNOSIS — C773 Secondary and unspecified malignant neoplasm of axilla and upper limb lymph nodes: Secondary | ICD-10-CM | POA: Diagnosis not present

## 2021-08-13 DIAGNOSIS — C50412 Malignant neoplasm of upper-outer quadrant of left female breast: Secondary | ICD-10-CM | POA: Diagnosis not present

## 2021-08-14 ENCOUNTER — Ambulatory Visit: Payer: 59

## 2021-08-14 ENCOUNTER — Ambulatory Visit
Admission: RE | Admit: 2021-08-14 | Discharge: 2021-08-14 | Disposition: A | Discharge status: Home / Self Care | Payer: 59 | Source: Ambulatory Visit | Attending: Radiation Oncology | Admitting: Radiation Oncology

## 2021-08-14 DIAGNOSIS — C50412 Malignant neoplasm of upper-outer quadrant of left female breast: Secondary | ICD-10-CM | POA: Diagnosis not present

## 2021-08-14 DIAGNOSIS — C773 Secondary and unspecified malignant neoplasm of axilla and upper limb lymph nodes: Secondary | ICD-10-CM | POA: Diagnosis not present

## 2021-08-14 DIAGNOSIS — Z170 Estrogen receptor positive status [ER+]: Secondary | ICD-10-CM | POA: Diagnosis not present

## 2021-08-15 ENCOUNTER — Ambulatory Visit
Admission: RE | Admit: 2021-08-15 | Discharge: 2021-08-15 | Disposition: A | Discharge status: Home / Self Care | Payer: 59 | Source: Ambulatory Visit | Attending: Radiation Oncology | Admitting: Radiation Oncology

## 2021-08-15 ENCOUNTER — Ambulatory Visit: Payer: 59

## 2021-08-15 DIAGNOSIS — Z170 Estrogen receptor positive status [ER+]: Secondary | ICD-10-CM | POA: Diagnosis not present

## 2021-08-15 DIAGNOSIS — C50412 Malignant neoplasm of upper-outer quadrant of left female breast: Secondary | ICD-10-CM | POA: Diagnosis not present

## 2021-08-15 DIAGNOSIS — C773 Secondary and unspecified malignant neoplasm of axilla and upper limb lymph nodes: Secondary | ICD-10-CM | POA: Diagnosis not present

## 2021-08-18 ENCOUNTER — Ambulatory Visit: Payer: 59

## 2021-08-18 ENCOUNTER — Ambulatory Visit
Admission: RE | Admit: 2021-08-18 | Discharge: 2021-08-18 | Disposition: A | Discharge status: Home / Self Care | Payer: 59 | Source: Ambulatory Visit | Attending: Radiation Oncology | Admitting: Radiation Oncology

## 2021-08-18 ENCOUNTER — Other Ambulatory Visit: Payer: Self-pay

## 2021-08-18 DIAGNOSIS — Z170 Estrogen receptor positive status [ER+]: Secondary | ICD-10-CM | POA: Diagnosis not present

## 2021-08-18 DIAGNOSIS — C50412 Malignant neoplasm of upper-outer quadrant of left female breast: Secondary | ICD-10-CM | POA: Diagnosis not present

## 2021-08-18 DIAGNOSIS — C773 Secondary and unspecified malignant neoplasm of axilla and upper limb lymph nodes: Secondary | ICD-10-CM | POA: Diagnosis not present

## 2021-08-19 ENCOUNTER — Encounter: Payer: Self-pay | Admitting: *Deleted

## 2021-08-19 ENCOUNTER — Ambulatory Visit
Admission: RE | Admit: 2021-08-19 | Discharge: 2021-08-19 | Disposition: A | Discharge status: Home / Self Care | Payer: 59 | Source: Ambulatory Visit | Attending: Radiation Oncology | Admitting: Radiation Oncology

## 2021-08-19 ENCOUNTER — Encounter (HOSPITAL_BASED_OUTPATIENT_CLINIC_OR_DEPARTMENT_OTHER): Payer: Self-pay | Admitting: Surgery

## 2021-08-19 ENCOUNTER — Other Ambulatory Visit: Payer: Self-pay

## 2021-08-19 DIAGNOSIS — C50412 Malignant neoplasm of upper-outer quadrant of left female breast: Secondary | ICD-10-CM

## 2021-08-19 DIAGNOSIS — Z170 Estrogen receptor positive status [ER+]: Secondary | ICD-10-CM | POA: Diagnosis not present

## 2021-08-19 DIAGNOSIS — C773 Secondary and unspecified malignant neoplasm of axilla and upper limb lymph nodes: Secondary | ICD-10-CM | POA: Diagnosis not present

## 2021-08-20 ENCOUNTER — Ambulatory Visit
Admission: RE | Admit: 2021-08-20 | Discharge: 2021-08-20 | Disposition: A | Discharge status: Home / Self Care | Payer: 59 | Source: Ambulatory Visit | Attending: Radiation Oncology | Admitting: Radiation Oncology

## 2021-08-20 ENCOUNTER — Other Ambulatory Visit: Payer: Self-pay

## 2021-08-20 ENCOUNTER — Ambulatory Visit: Payer: 59

## 2021-08-20 DIAGNOSIS — C773 Secondary and unspecified malignant neoplasm of axilla and upper limb lymph nodes: Secondary | ICD-10-CM | POA: Diagnosis not present

## 2021-08-20 DIAGNOSIS — Z170 Estrogen receptor positive status [ER+]: Secondary | ICD-10-CM | POA: Diagnosis not present

## 2021-08-20 DIAGNOSIS — C50412 Malignant neoplasm of upper-outer quadrant of left female breast: Secondary | ICD-10-CM | POA: Diagnosis not present

## 2021-08-20 NOTE — Progress Notes (Signed)
 Patient Care Team: Husain, Karrar, MD as PCP - General (Internal Medicine) Martini, Keisha N, RN as Oncology Nurse Navigator Stuart, Dawn C, RN as Oncology Nurse Navigator Blackman, Douglas, MD as Consulting Physician (General Surgery) , , MD as Consulting Physician (Hematology and Oncology) Kinard, James, MD as Consulting Physician (Radiation Oncology) Patterson, Angela M, RN (Inactive) as Registered Nurse  DIAGNOSIS:    ICD-10-CM   1. Malignant neoplasm of upper-outer quadrant of left breast in female, estrogen receptor positive (HCC)  C50.412    Z17.0       SUMMARY OF ONCOLOGIC HISTORY: Oncology History  Malignant neoplasm of upper-outer quadrant of left breast in female, estrogen receptor positive (HCC)  09/19/2020 Initial Diagnosis   Mammogram in 07/2018 showed probably benign bilateral breast masses that were stable on her last mammogram on 07/26/19. Mammogram on 08/28/20 showed the stable bilateral masses, a new 0.9cm mass at the 1 o'clock position in the left breast, and one abnormal left axillary lymph node with 0.6cm cortical thickening. 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    10/02/2020 Genetic Testing   Negative genetic testing: no pathogenic variants detected in [facility] Common-Hereditary Cancers Panel.  Variants of uncertain significance detected in APC c.6918T>A (p.Asp2306Glu) and POLE  c.2612G>C (p.Ser871Thr).  The report date is November 24. 2021.   The Common Hereditary Cancers Panel offered by [facility] includes sequencing and/or deletion duplication testing of the following 48 genes: APC, ATM, AXIN2, BARD1, BMPR1A, BRCA1, BRCA2, BRIP1, CDH1, CDK4, CDKN2A (p14ARF), CDKN2A (p16INK4a), CHEK2, CTNNA1, DICER1, EPCAM (Deletion/duplication testing only), GREM1 (promoter region deletion/duplication testing only), KIT, MEN1, MLH1, MSH2, MSH3, MSH6, MUTYH, NBN, NF1,  NHTL1, PALB2, PDGFRA, PMS2, POLD1, POLE, PTEN, RAD50, RAD51C, RAD51D, RNF43, SDHB, SDHC, SDHD, SMAD4, SMARCA4. STK11, TP53, TSC1, TSC2, and VHL.  The following genes were evaluated for sequence changes only: SDHA and HOXB13 c.251G>A variant only.   12/04/2020 Surgery   Bilateral mastectomies with reconstruction ([facility]):  Right breast: no malignancy  Left breast: invasive and in situ ductal carcinoma, 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    12/04/2020 Cancer Staging   Staging form: Breast, AJCC 8th Edition - Pathologic stage from 12/04/2020: Stage IA (pT1c, pN1a, cM0, G2, ER+, PR+, HER2-) - Signed by Causey, Lindsey Cornetto, NP on 12/18/2020 Histologic grading system: 3 grade system   01/22/2021 - 06/19/2021 Chemotherapy   Patient is on Treatment Plan : BREAST ADJUVANT DOSE DENSE AC q14d / PACLitaxel q7d       CHIEF COMPLIANT: Follow-up of left breast cancer  INTERVAL HISTORY: Jennifer Davis is a 45 y.o. with above-mentioned history of left breast cancer who underwent bilateral mastectomies with reconstruction and adjuvant chemotherapy with weekly Taxol after completing 4 cycles of Adriamycin and Cytoxan. She presents to the clinic today for follow-up.   ALLERGIES:  is allergic to bee venom and other.  MEDICATIONS:  Current Outpatient Medications  Medication Sig Dispense Refill   acetaminophen (TYLENOL) 500 MG tablet TAKE 1 TABLET (500 MG TOTAL) BY MOUTH EVERY 6 (SIX) HOURS AS NEEDED. FOR USE AFTER SURGERY (Patient taking differently: Take 500 mg by mouth every 6 (six) hours as needed for mild pain, fever or headache.) 30 tablet 0   albuterol (PROAIR HFA) 108 (90 Base) MCG/ACT inhaler Inhale 2 puffs into the lungs every 4-6 hours as needed 8.5 g 0   azithromycin (ZITHROMAX) 250 MG   tablet Take 2 tablets by mouth on day 1, then 1 tablet by mouth on days 2-5 6 tablet 0   Biotin w/ Vitamins C & E (HAIR/SKIN/NAILS PO) Take by mouth.      budesonide-formoterol (SYMBICORT) 160-4.5 MCG/ACT inhaler INHALE 2 PUFFS BY MOUTH 2 TIMES A DAY 10.2 g 5   cetirizine (ZYRTEC) 10 MG tablet Take 10 mg by mouth daily.     DULoxetine (CYMBALTA) 60 MG capsule Take 2 capsules (120 mg total) by mouth daily. 180 capsule 1   EPINEPHrine (EPIPEN 2-PAK) 0.3 mg/0.3 mL IJ SOAJ injection Inject 0.3 mLs (0.3 mg total) into the muscle once as needed for up to 1 dose (for severe allergic reaction). CAll 911 immediately if you have to use this medicine 1 each 1   EPINEPHrine 0.3 mg/0.3 mL IJ SOAJ injection Use as directed in case of anaphlylaxis 2 each 1   fluconazole (DIFLUCAN) 150 MG tablet Take 1 tablet by mouth once.  Can take second tablet in 3 days if symptoms persist. 1 tablet 1   ibuprofen (ADVIL) 600 MG tablet TAKE 1 TABLET (600 MG TOTAL) BY MOUTH EVERY 6 (SIX) HOURS AS NEEDED FOR MILD PAIN OR MODERATE PAIN. FOR USE AFTER SURGERY 30 tablet 0   lidocaine-prilocaine (EMLA) cream APPLY TO AFFECTED AREA ONCE 30 g 3   loratadine (CLARITIN) 10 MG tablet Take 10 mg by mouth daily. (Patient not taking: No sig reported)     LORazepam (ATIVAN) 0.5 MG tablet Take 0.5 mg by mouth at bedtime as needed (nausea).     montelukast (SINGULAIR) 10 MG tablet Take 10 mg by mouth at bedtime.  5   Multiple Vitamin (MULTIVITAMIN WITH MINERALS) TABS tablet Take 1 tablet by mouth daily.     PROAIR HFA 108 (90 Base) MCG/ACT inhaler Inhale 2 puffs into the lungs as needed for wheezing or shortness of breath.  0   prochlorperazine (COMPAZINE) 10 MG tablet TAKE 1 TABLET (10 MG TOTAL) BY MOUTH EVERY 6 (SIX) HOURS AS NEEDED (NAUSEA OR VOMITING). (Patient not taking: No sig reported) 30 tablet 1   No current facility-administered medications for this visit.    PHYSICAL EXAMINATION: ECOG PERFORMANCE STATUS: 1 - Symptomatic but completely ambulatory  Vitals:   08/21/21 1517  BP: 117/81  Pulse: (!) 103  Resp: 19  Temp: 97.7 F (36.5 C)  SpO2: 98%   Filed Weights   08/21/21  1517  Weight: 217 lb 9.6 oz (98.7 kg)      LABORATORY DATA:  I have reviewed the data as listed CMP Latest Ref Rng & Units 06/19/2021 06/12/2021 06/05/2021  Glucose 70 - 99 mg/dL 143(H) 128(H) 133(H)  BUN 6 - 20 mg/dL _0 Creatinine 0.44 - 1.00 mg/dL 0.80 0.74 0.76  Sodium 135 - 145 mmol/L 141 141 141  Potassium 3.5 - 5.1 mmol/L 3.8 3.5 3.5  Chloride 98 - 111 mmol/L 107 106 107  CO2 22 - 32 mmol/L _1 Calcium 8.9 - 10.3 mg/dL 9.1 9.0 9.2  Total Protein 6.5 - 8.1 g/dL 6.7 6.4(L) 6.7  Total Bilirubin 0.3 - 1.2 mg/dL 0.2(L) 0.2(L) <0.2(L)  Alkaline Phos 38 - 126 U/L 83 74 90  AST 15 - 41 U/L 33 32 24  ALT 0 - 44 U/L 50(H) 51(H) 38    Lab Results  Component Value Date   WBC 7.1 06/19/2021   HGB 9.9 (L) 06/19/2021   HCT 31.7 (L) 06/19/2021   MCV 89.0 06/19/2021  PLT 465 (H) 06/19/2021   NEUTROABS 5.0 06/19/2021    ASSESSMENT & PLAN:  Malignant neoplasm of upper-outer quadrant of left breast in female, estrogen receptor positive (HCC) 09/20/2019: Screening mammogram on 08/28/20 showed the stable bilateral masses, a new 0.9cm mass at the 1 o'clock position in the left breast, and one abnormal left axillary lymph node with 0.6cm cortical thickening. 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  MammaPrint: High risk: Luminal type B, 5-year metastasis free survival with chemo and hormone therapy: 93%, absolute benefit from chemo greater than 12%, average 10-year risk of recurrence untreated: 29%   Treatment plan: 1.  :Bilateral mastectomies with reconstruction ([facility]): Right mastectomy: no malignancy Left mastectomy: invasive and in situ ductal carcinoma, 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  2.  adjuvant chemotherapy with dose dense Adriamycin and Cytoxan followed by Taxol completed 06/19/2021 3.  Adjuvant radiation  07/09/2021-08/22/2021 4.  Followed by adjuvant antiestrogen therapy with tamoxifen to start 09/09/2021 ------------------------------------------------------------------------------------------------------------- Treatment plan: Antiestrogen therapy with letrozole 2.5 mg daily to start 09/09/2021  We discussed the risks and benefits of tamoxifen. These include but not limited to insomnia, hot flashes, mood changes, vaginal dryness, and weight gain. Although rare, serious side effects including endometrial cancer, risk of blood clots were also discussed. We strongly believe that the benefits far outweigh the risks. Patient understands these risks and consented to starting treatment. Planned treatment duration is 10 years.  She took tamoxifen before surgeries and has some prescription at home.  She has tolerated it reasonably well except for hot flashes. Return to clinic in 3 months for survivorship care plan visit.  No orders of the defined types were placed in this encounter.  The patient has a good understanding of the overall plan. she agrees with it. she will call with any problems that may develop before the next visit here.  Total time spent: 20 mins including face to face time and time spent for planning, charting and coordination of care   K , MD, MPH 08/21/2021  I, Kirstyn Evans, am acting as scribe for Dr.  .  I have reviewed the above documentation for accuracy and completeness, and I agree with the above.       

## 2021-08-21 ENCOUNTER — Ambulatory Visit: Payer: 59

## 2021-08-21 ENCOUNTER — Other Ambulatory Visit (HOSPITAL_COMMUNITY): Payer: Self-pay

## 2021-08-21 ENCOUNTER — Ambulatory Visit
Admission: RE | Admit: 2021-08-21 | Discharge: 2021-08-21 | Disposition: A | Discharge status: Home / Self Care | Payer: 59 | Source: Ambulatory Visit | Attending: Radiation Oncology | Admitting: Radiation Oncology

## 2021-08-21 ENCOUNTER — Inpatient Hospital Stay: Payer: 59 | Attending: Hematology and Oncology | Admitting: Hematology and Oncology

## 2021-08-21 DIAGNOSIS — C773 Secondary and unspecified malignant neoplasm of axilla and upper limb lymph nodes: Secondary | ICD-10-CM | POA: Diagnosis not present

## 2021-08-21 DIAGNOSIS — Z170 Estrogen receptor positive status [ER+]: Secondary | ICD-10-CM | POA: Insufficient documentation

## 2021-08-21 DIAGNOSIS — C50412 Malignant neoplasm of upper-outer quadrant of left female breast: Secondary | ICD-10-CM | POA: Insufficient documentation

## 2021-08-21 MED ORDER — TAMOXIFEN CITRATE 20 MG PO TABS
20.0000 mg | ORAL_TABLET | Freq: Every day | ORAL | 3 refills | Status: DC
  Filled 2021-08-21 – 2021-09-11 (×2): qty 90, 90d supply, fill #0
  Filled 2021-12-15: qty 90, 90d supply, fill #1
  Filled 2022-03-16: qty 90, 90d supply, fill #2

## 2021-08-21 NOTE — Assessment & Plan Note (Signed)
09/20/2019: Screening mammogram on 08/28/20 showed the stable bilateral masses, a new 0.9cm mass at the 1 o'clock position in the left breast, and one abnormal left axillary lymph node with 0.6cm cortical thickening. 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 MammaPrint: High risk: Luminal type B, 5-year metastasis free survival with chemo and hormone therapy: 93%, absolute benefit from chemo greater than 12%, average 10-year risk of recurrence untreated: 29%  Treatment plan: 1.:Bilateral mastectomies with reconstruction Jennifer Davis): Right mastectomy: no malignancy Left mastectomy: invasive and in situ ductal carcinoma, 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 2.adjuvant chemotherapy with dose dense Adriamycin and Cytoxan followed by Taxol completed 06/19/2021 3.Adjuvant radiation 07/09/2021-08/22/2021 4.Followed by adjuvant antiestrogen therapy ------------------------------------------------------------------------------------------------------------- Treatment plan: Antiestrogen therapy with letrozole 2.5 mg daily to start 09/09/2021  We discussed the risks and benefits of tamoxifen. These include but not limited to insomnia, hot flashes, mood changes, vaginal dryness, and weight gain. Although rare, serious side effects including endometrial cancer, risk of blood clots were also discussed. We strongly believe that the benefits far outweigh the risks. Patient understands these risks and consented to starting treatment. Planned treatment duration is 10 years.

## 2021-08-22 ENCOUNTER — Other Ambulatory Visit (HOSPITAL_COMMUNITY): Payer: Self-pay

## 2021-08-22 ENCOUNTER — Ambulatory Visit
Admission: RE | Admit: 2021-08-22 | Discharge: 2021-08-22 | Disposition: A | Discharge status: Home / Self Care | Payer: 59 | Source: Ambulatory Visit | Attending: Radiation Oncology | Admitting: Radiation Oncology

## 2021-08-22 DIAGNOSIS — Z170 Estrogen receptor positive status [ER+]: Secondary | ICD-10-CM | POA: Diagnosis not present

## 2021-08-22 DIAGNOSIS — C773 Secondary and unspecified malignant neoplasm of axilla and upper limb lymph nodes: Secondary | ICD-10-CM | POA: Diagnosis not present

## 2021-08-22 DIAGNOSIS — C50412 Malignant neoplasm of upper-outer quadrant of left female breast: Secondary | ICD-10-CM | POA: Diagnosis not present

## 2021-08-25 ENCOUNTER — Other Ambulatory Visit (HOSPITAL_COMMUNITY): Payer: Self-pay

## 2021-08-26 ENCOUNTER — Encounter: Payer: Self-pay | Admitting: Hematology and Oncology

## 2021-08-26 NOTE — H&P (Signed)
Jennifer Davis is an 45 y.o. female.   Chief Complaint: Port-A-Cath no longer needed HPI: This is a 45 year old female who has completed her treatment for breast cancer and no longer needs her Port-A-Cath the decision was made to proceed with Port-A-Cath removal.  She is doing well and has no complaints  Past Medical History:  Diagnosis Date   Anxiety    Asthma    Asthma due to seasonal allergies    Breast cancer ([place])    Dyspnea    after chemo - fatiqued   GERD (gastroesophageal reflux disease)    occasionl   Palpitations 2017   tachy    PTSD (post-traumatic stress disorder)     Past Surgical History:  Procedure Laterality Date   BREAST RECONSTRUCTION WITH PLACEMENT OF TISSUE EXPANDER AND FLEX HD (ACELLULAR HYDRATED DERMIS) Bilateral 12/04/2020   Procedure: IMMEDIATE BILATERAL BREAST RECONSTRUCTION WITH PLACEMENT OF TISSUE EXPANDER AND FLEX HD (ACELLULAR HYDRATED DERMIS);  Surgeon: Wallace Going, DO;  Location: [place];  Service: Plastics;  Laterality: Bilateral;   CESAREAN SECTION     x3   DEBRIDEMENT AND CLOSURE WOUND Right 04/10/2021   Procedure: excision of right breast wound with closure;  Surgeon: Wallace Going, DO;  Location: [place];  Service: Plastics;  Laterality: Right;   MASTECTOMY WITH AXILLARY LYMPH NODE DISSECTION Left 12/04/2020   Procedure: BILATERAL MASTECTOMIES, RADIOACTIVE SEED GUIDED TARGETED LEFT AXILLARY NODE DISSECTION;  Surgeon: Coralie Keens, MD;  Location: [place];  Service: General;  Laterality: Left;   PORTACATH PLACEMENT Left 12/04/2020   Procedure: INSERTION PORT-A-CATH LEFT SUBCLAVIAN;  Surgeon: Coralie Keens, MD;  Location: [place];  Service: General;  Laterality: Left;   REMOVAL OF TISSUE EXPANDER AND PLACEMENT OF IMPLANT Right 01/04/2021   Procedure: REMOVAL OF TISSUE EXPANDER AND PLACEMENT OF NEW EXPANDER;  Surgeon: Wallace Going, DO;  Location: [place];   Service: Plastics;  Laterality: Right;   TISSUE EXPANDER PLACEMENT Right 03/19/2021   Procedure: Removal of right tissue expander;  Surgeon: Wallace Going, DO;  Location: [place];  Service: Plastics;  Laterality: Right;  45 min    Family History  Problem Relation Age of Onset   Hypertension Mother    Fibromyalgia Mother    Colon cancer Mother 19   Asthma Sister        x2   Delorse Limber White syndrome Maternal Aunt 43   Hypertension Maternal Grandmother    Hypertension Maternal Grandfather    Cancer - Lung Maternal Grandfather    Stroke Maternal Grandfather    Colon cancer Other        MGM's niece, dx unknown age   Colon cancer Other        MGM's nephew; dx unknown age   Breast cancer Neg Hx    Social History:  reports that she quit smoking about 9 years ago. Her smoking use included cigarettes. She has never used smokeless tobacco. She reports that she does not currently use alcohol. She reports that she does not use drugs.  Allergies:  Allergies  Allergen Reactions   Bee Venom Hives    wheezy   Other Itching    environmental allergies sometime activate asthma    No medications prior to admission.    No results found for this or any previous visit (from the past 48 hour(s)). No results found.  Review of Systems  All other systems reviewed and are negative.  Height 4\' 11"  (  1.499 m), weight 97.5 kg. Physical Exam Constitutional:      General: She is not in acute distress.    Appearance: Normal appearance.  Eyes:     Pupils: Pupils are equal, round, and reactive to light.  Cardiovascular:     Rate and Rhythm: Normal rate and regular rhythm.     Pulses: Normal pulses.     Heart sounds: Normal heart sounds.  Pulmonary:     Effort: Pulmonary effort is normal.     Breath sounds: Normal breath sounds.  Abdominal:     General: Abdomen is flat.  Musculoskeletal:     Cervical back: Normal range of motion.  Skin:    General: Skin is warm and dry.   Neurological:     General: No focal deficit present.     Mental Status: She is alert.  Psychiatric:        Mood and Affect: Mood normal.        Behavior: Behavior normal.     Assessment/Plan Port-A-Cath no longer needed with therapy complete  We will proceed to the operating room for Port-A-Cath removal.  The risks were discussed with the patient in detail.  She agrees to proceed.  Coralie Keens, MD 08/26/2021, 6:37 PM

## 2021-08-27 ENCOUNTER — Ambulatory Visit (HOSPITAL_BASED_OUTPATIENT_CLINIC_OR_DEPARTMENT_OTHER)
Admission: RE | Admit: 2021-08-27 | Discharge: 2021-08-27 | Disposition: A | Discharge status: Home / Self Care | Payer: 59 | Attending: Surgery | Admitting: Surgery

## 2021-08-27 ENCOUNTER — Encounter (HOSPITAL_BASED_OUTPATIENT_CLINIC_OR_DEPARTMENT_OTHER)
Admission: RE | Disposition: A | Discharge status: Home / Self Care | Payer: Self-pay | Source: Home / Self Care | Attending: Surgery

## 2021-08-27 ENCOUNTER — Ambulatory Visit (HOSPITAL_BASED_OUTPATIENT_CLINIC_OR_DEPARTMENT_OTHER): Payer: 59 | Admitting: Certified Registered"

## 2021-08-27 ENCOUNTER — Other Ambulatory Visit: Payer: Self-pay

## 2021-08-27 ENCOUNTER — Encounter (HOSPITAL_BASED_OUTPATIENT_CLINIC_OR_DEPARTMENT_OTHER): Payer: Self-pay | Admitting: Surgery

## 2021-08-27 DIAGNOSIS — F4310 Post-traumatic stress disorder, unspecified: Secondary | ICD-10-CM | POA: Diagnosis not present

## 2021-08-27 DIAGNOSIS — Z87891 Personal history of nicotine dependence: Secondary | ICD-10-CM | POA: Diagnosis not present

## 2021-08-27 DIAGNOSIS — Z91030 Bee allergy status: Secondary | ICD-10-CM | POA: Insufficient documentation

## 2021-08-27 DIAGNOSIS — C50919 Malignant neoplasm of unspecified site of unspecified female breast: Secondary | ICD-10-CM | POA: Diagnosis not present

## 2021-08-27 DIAGNOSIS — Z853 Personal history of malignant neoplasm of breast: Secondary | ICD-10-CM | POA: Diagnosis not present

## 2021-08-27 DIAGNOSIS — Z9221 Personal history of antineoplastic chemotherapy: Secondary | ICD-10-CM | POA: Diagnosis not present

## 2021-08-27 DIAGNOSIS — F418 Other specified anxiety disorders: Secondary | ICD-10-CM | POA: Diagnosis not present

## 2021-08-27 DIAGNOSIS — Z452 Encounter for adjustment and management of vascular access device: Secondary | ICD-10-CM | POA: Insufficient documentation

## 2021-08-27 DIAGNOSIS — Z9013 Acquired absence of bilateral breasts and nipples: Secondary | ICD-10-CM | POA: Diagnosis not present

## 2021-08-27 HISTORY — PX: PORT-A-CATH REMOVAL: SHX5289

## 2021-08-27 LAB — POCT PREGNANCY, URINE: Preg Test, Ur: NEGATIVE

## 2021-08-27 SURGERY — REMOVAL PORT-A-CATH
Anesthesia: Monitor Anesthesia Care | Site: Chest | Laterality: Left

## 2021-08-27 MED ORDER — ONDANSETRON HCL 4 MG/2ML IJ SOLN
INTRAMUSCULAR | Status: AC
  Filled 2021-08-27: qty 2

## 2021-08-27 MED ORDER — HYDROMORPHONE HCL 1 MG/ML IJ SOLN: 0.2500 mg | INTRAMUSCULAR | Status: DC | PRN

## 2021-08-27 MED ORDER — PROPOFOL 500 MG/50ML IV EMUL
INTRAVENOUS | Status: AC
  Filled 2021-08-27: qty 50

## 2021-08-27 MED ORDER — FENTANYL CITRATE (PF) 100 MCG/2ML IJ SOLN
INTRAMUSCULAR | Status: DC | PRN
  Administered 2021-08-27 (×4): 25 ug via INTRAVENOUS

## 2021-08-27 MED ORDER — LIDOCAINE 2% (20 MG/ML) 5 ML SYRINGE
INTRAMUSCULAR | Status: AC
  Filled 2021-08-27: qty 5

## 2021-08-27 MED ORDER — ACETAMINOPHEN 500 MG PO TABS
1000.0000 mg | ORAL_TABLET | ORAL | Status: AC
  Administered 2021-08-27: 1000 mg via ORAL

## 2021-08-27 MED ORDER — LACTATED RINGERS IV SOLN: INTRAVENOUS | Status: DC

## 2021-08-27 MED ORDER — FENTANYL CITRATE (PF) 100 MCG/2ML IJ SOLN
INTRAMUSCULAR | Status: AC
  Filled 2021-08-27: qty 2

## 2021-08-27 MED ORDER — ONDANSETRON HCL 4 MG/2ML IJ SOLN
INTRAMUSCULAR | Status: DC | PRN
Start: 2021-08-27 — End: 2021-08-27
  Administered 2021-08-27: 4 mg via INTRAVENOUS

## 2021-08-27 MED ORDER — PROMETHAZINE HCL 25 MG/ML IJ SOLN: 6.2500 mg | INTRAMUSCULAR | Status: DC | PRN

## 2021-08-27 MED ORDER — MIDAZOLAM HCL 2 MG/2ML IJ SOLN
INTRAMUSCULAR | Status: AC
  Filled 2021-08-27: qty 2

## 2021-08-27 MED ORDER — CEFAZOLIN SODIUM-DEXTROSE 2-4 GM/100ML-% IV SOLN
INTRAVENOUS | Status: AC
  Filled 2021-08-27: qty 100

## 2021-08-27 MED ORDER — OXYCODONE HCL 5 MG PO TABS: 5.0000 mg | ORAL_TABLET | Freq: Once | ORAL | Status: DC | PRN

## 2021-08-27 MED ORDER — CHLORHEXIDINE GLUCONATE CLOTH 2 % EX PADS: 6.0000 | MEDICATED_PAD | Freq: Once | CUTANEOUS | Status: DC

## 2021-08-27 MED ORDER — SODIUM BICARBONATE 4.2 % IV SOLN
INTRAVENOUS | Status: AC
  Filled 2021-08-27: qty 10

## 2021-08-27 MED ORDER — CEFAZOLIN SODIUM-DEXTROSE 2-4 GM/100ML-% IV SOLN
2.0000 g | INTRAVENOUS | Status: AC
  Administered 2021-08-27: 2 g via INTRAVENOUS

## 2021-08-27 MED ORDER — OXYCODONE HCL 5 MG/5ML PO SOLN: 5.0000 mg | Freq: Once | ORAL | Status: DC | PRN

## 2021-08-27 MED ORDER — LIDOCAINE-EPINEPHRINE 1 %-1:100000 IJ SOLN
INTRAMUSCULAR | Status: AC
  Filled 2021-08-27: qty 1

## 2021-08-27 MED ORDER — PROPOFOL 500 MG/50ML IV EMUL
INTRAVENOUS | Status: DC | PRN
  Administered 2021-08-27: 50 mg via INTRAVENOUS
  Administered 2021-08-27: 200 ug/kg/min via INTRAVENOUS
  Administered 2021-08-27: 50 mg via INTRAVENOUS

## 2021-08-27 MED ORDER — LIDOCAINE-EPINEPHRINE (PF) 1 %-1:200000 IJ SOLN
INTRAMUSCULAR | Status: DC | PRN
  Administered 2021-08-27: 7 mL

## 2021-08-27 MED ORDER — BUPIVACAINE HCL (PF) 0.25 % IJ SOLN
INTRAMUSCULAR | Status: AC
  Filled 2021-08-27: qty 30

## 2021-08-27 MED ORDER — PROPOFOL 10 MG/ML IV BOLUS
INTRAVENOUS | Status: AC
  Filled 2021-08-27: qty 40

## 2021-08-27 MED ORDER — ACETAMINOPHEN 500 MG PO TABS
ORAL_TABLET | ORAL | Status: AC
  Filled 2021-08-27: qty 2

## 2021-08-27 MED ORDER — MIDAZOLAM HCL 5 MG/5ML IJ SOLN
INTRAMUSCULAR | Status: DC | PRN
  Administered 2021-08-27: 2 mg via INTRAVENOUS

## 2021-08-27 SURGICAL SUPPLY — 32 items
ADH SKN CLS APL DERMABOND .7 (GAUZE/BANDAGES/DRESSINGS) ×1
APL PRP STRL LF DISP 70% ISPRP (MISCELLANEOUS) ×1
BLADE SURG 15 STRL LF DISP TIS (BLADE) ×1 IMPLANT
BLADE SURG 15 STRL SS (BLADE) ×2
CHLORAPREP W/TINT 26 (MISCELLANEOUS) ×2 IMPLANT
COVER BACK TABLE 60X90IN (DRAPES) ×2 IMPLANT
COVER MAYO STAND STRL (DRAPES) ×2 IMPLANT
DECANTER SPIKE VIAL GLASS SM (MISCELLANEOUS) IMPLANT
DERMABOND ADVANCED (GAUZE/BANDAGES/DRESSINGS) ×1
DERMABOND ADVANCED .7 DNX12 (GAUZE/BANDAGES/DRESSINGS) ×1 IMPLANT
DRAPE LAPAROTOMY 100X72 PEDS (DRAPES) ×2 IMPLANT
DRAPE UTILITY XL STRL (DRAPES) ×2 IMPLANT
ELECT REM PT RETURN 9FT ADLT (ELECTROSURGICAL) ×2
ELECTRODE REM PT RTRN 9FT ADLT (ELECTROSURGICAL) ×1 IMPLANT
GAUZE 4X4 16PLY ~~LOC~~+RFID DBL (SPONGE) ×2 IMPLANT
GLOVE SURG SIGNA 7.5 PF LTX (GLOVE) ×2 IMPLANT
GOWN STRL REUS W/ TWL LRG LVL3 (GOWN DISPOSABLE) ×1 IMPLANT
GOWN STRL REUS W/ TWL XL LVL3 (GOWN DISPOSABLE) ×1 IMPLANT
GOWN STRL REUS W/TWL LRG LVL3 (GOWN DISPOSABLE) ×2
GOWN STRL REUS W/TWL XL LVL3 (GOWN DISPOSABLE) ×2
KIT TURNOVER CYSTO (KITS) ×2 IMPLANT
NEEDLE HYPO 25X1 1.5 SAFETY (NEEDLE) ×2 IMPLANT
NS IRRIG 1000ML POUR BTL (IV SOLUTION) IMPLANT
PACK BASIN DAY SURGERY FS (CUSTOM PROCEDURE TRAY) ×2 IMPLANT
PENCIL SMOKE EVACUATOR (MISCELLANEOUS) ×2 IMPLANT
SLEEVE SCD COMPRESS KNEE MED (STOCKING) IMPLANT
SUT MNCRL AB 4-0 PS2 18 (SUTURE) ×2 IMPLANT
SUT VIC AB 3-0 SH 27 (SUTURE) ×2
SUT VIC AB 3-0 SH 27X BRD (SUTURE) ×1 IMPLANT
SYR BULB EAR ULCER 3OZ GRN STR (SYRINGE) IMPLANT
SYR CONTROL 10ML LL (SYRINGE) ×2 IMPLANT
TOWEL OR 17X26 10 PK STRL BLUE (TOWEL DISPOSABLE) ×2 IMPLANT

## 2021-08-27 NOTE — Interval H&P Note (Signed)
History and Physical Interval Note:no change in H and P  08/27/2021 8:06 AM  Jennifer Davis  has presented today for surgery, with the diagnosis of HISTORY OF BREAST CANCER.  The various methods of treatment have been discussed with the patient and family. After consideration of risks, benefits and other options for treatment, the patient has consented to  Procedure(s): REMOVAL PORT-A-CATH (N/A) as a surgical intervention.  The patient's history has been reviewed, patient examined, no change in status, stable for surgery.  I have reviewed the patient's chart and labs.  Questions were answered to the patient's satisfaction.     Coralie Keens

## 2021-08-27 NOTE — Anesthesia Postprocedure Evaluation (Signed)
Anesthesia Post Note  Patient: Jennifer Davis  Procedure(s) Performed: REMOVAL PORT-A-CATH (Left: Chest)     Patient location during evaluation: PACU Anesthesia Type: MAC Level of consciousness: awake and alert Pain management: pain level controlled Vital Signs Assessment: post-procedure vital signs reviewed and stable Respiratory status: spontaneous breathing, nonlabored ventilation and respiratory function stable Cardiovascular status: blood pressure returned to baseline and stable Postop Assessment: no apparent nausea or vomiting Anesthetic complications: no   No notable events documented.  Last Vitals:  Vitals:   08/27/21 0900 08/27/21 0935  BP: 131/66 119/65  Pulse: 87 89  Resp: 15 20  Temp:  36.6 C  SpO2: 93% 95%    Last Pain:  Vitals:   08/27/21 0935  TempSrc: Oral  PainSc: 0-No pain                 Lynda Rainwater

## 2021-08-27 NOTE — Discharge Instructions (Addendum)
Ok to shower starting tomorrow  Ice pack, tylenol, and ibuprofen for pain  No vigorous activity for 5 to 7 days  No Tylenol until 1pm   Post Anesthesia Home Care Instructions  Activity: Get plenty of rest for the remainder of the day. A responsible individual must stay with you for 24 hours following the procedure.  For the next 24 hours, DO NOT: -Drive a car -Paediatric nurse -Drink alcoholic beverages -Take any medication unless instructed by your physician -Make any legal decisions or sign important papers.  Meals: Start with liquid foods such as gelatin or soup. Progress to regular foods as tolerated. Avoid greasy, spicy, heavy foods. If nausea and/or vomiting occur, drink only clear liquids until the nausea and/or vomiting subsides. Call your physician if vomiting continues.  Special Instructions/Symptoms: Your throat may feel dry or sore from the anesthesia or the breathing tube placed in your throat during surgery. If this causes discomfort, gargle with warm salt water. The discomfort should disappear within 24 hours.  If you had a scopolamine patch placed behind your ear for the management of post- operative nausea and/or vomiting:  1. The medication in the patch is effective for 72 hours, after which it should be removed.  Wrap patch in a tissue and discard in the trash. Wash hands thoroughly with soap and water. 2. You may remove the patch earlier than 72 hours if you experience unpleasant side effects which may include dry mouth, dizziness or visual disturbances. 3. Avoid touching the patch. Wash your hands with soap and water after contact with the patch.

## 2021-08-27 NOTE — Op Note (Signed)
REMOVAL PORT-A-CATH  Procedure Note  ANNALYSE LANGLAIS 08/27/2021   Pre-op Diagnosis: HISTORY OF BREAST CANCER, PORT-A-CATH NO LONGER NEEDED     Post-op Diagnosis: SAME  Procedure(s): REMOVAL PORT-A-CATH  Surgeon(s): Coralie Keens, MD  Anesthesia: Monitor Anesthesia Care  Staff:  Circulator: Eda Paschal, RN Scrub Person: Faythe Dingwall, RN  Estimated Blood Loss: Minimal               Indications: This is a 45 year old female who has completed her chemotherapy for breast cancer.  The decision was made to go ahead and proceed with Port-A-Cath removal  Procedure: The patient was brought to operating identifies correct patient.  She was placed upon the operating table and anesthesia was induced.  Her left chest was then prepped and draped in usual sterile fashion.  I anesthetized the skin at the previous incision for the Port-A-Cath with lidocaine.  I then made incision with a scalpel.  I then dissected down to the port with the cautery.  The port was identified and both sutures were removed.  I then was able to easily remove the port and the catheter completely intact.  I then closed the catheter site with a figure-of-eight 3-0 Vicryl suture.  I then closed the subcutaneous tissue with interrupted 3-0 Vicryl sutures and closed the skin with running 4-0 Monocryl.  Dermabond was then applied.  The patient tolerated the procedure well.  All the counts were correct at the end of the procedure.  The patient was then taken in stable condition from the operating room to the recovery room          Coralie Keens   Date: 08/27/2021  Time: 8:39 AM

## 2021-08-27 NOTE — Transfer of Care (Signed)
Immediate Anesthesia Transfer of Care Note  Patient: Jennifer Davis  Procedure(s) Performed: REMOVAL PORT-A-CATH (Left: Chest)  Patient Location: PACU  Anesthesia Type:MAC  Level of Consciousness: awake  Airway & Oxygen Therapy: Patient Spontanous Breathing and Patient connected to face mask oxygen  Post-op Assessment: Report given to RN and Post -op Vital signs reviewed and stable  Post vital signs: Reviewed and stable  Last Vitals:  Vitals Value Taken Time  BP 126/73 08/27/21 0843  Temp    Pulse 95 08/27/21 0844  Resp 20 08/27/21 0844  SpO2 100 % 08/27/21 0844  Vitals shown include unvalidated device data.  Last Pain:  Vitals:   08/27/21 0700  TempSrc: Oral  PainSc: 0-No pain      Patients Stated Pain Goal: 9 (05/20/18 7588)  Complications: No notable events documented.

## 2021-08-27 NOTE — Anesthesia Preprocedure Evaluation (Signed)
Anesthesia Evaluation  Patient identified by MRN, date of birth, ID band Patient awake    Reviewed: Allergy & Precautions, NPO status , Patient's Chart, lab work & pertinent test results  Airway Mallampati: I  TM Distance: >3 FB Neck ROM: Full    Dental no notable dental hx.    Pulmonary asthma , former smoker,    Pulmonary exam normal        Cardiovascular negative cardio ROS Normal cardiovascular exam     Neuro/Psych PSYCHIATRIC DISORDERS Anxiety Depression negative neurological ROS     GI/Hepatic Neg liver ROS, GERD  ,  Endo/Other  Morbid obesity  Renal/GU negative Renal ROS     Musculoskeletal negative musculoskeletal ROS (+)   Abdominal (+) + obese,   Peds  Hematology negative hematology ROS (+)   Anesthesia Other Findings   Reproductive/Obstetrics                             Anesthesia Physical  Anesthesia Plan  ASA: III  Anesthesia Plan: MAC   Post-op Pain Management:    Induction: Intravenous  PONV Risk Score and Plan: 2 and Ondansetron, Midazolam and Treatment may vary due to age or medical condition  Airway Management Planned: Simple Face Mask  Additional Equipment: None  Intra-op Plan:   Post-operative Plan:   Informed Consent: I have reviewed the patients History and Physical, chart, labs and discussed the procedure including the risks, benefits and alternatives for the proposed anesthesia with the patient or authorized representative who has indicated his/her understanding and acceptance.     Dental advisory given  Plan Discussed with: CRNA  Anesthesia Plan Comments:         Anesthesia Quick Evaluation

## 2021-08-28 ENCOUNTER — Encounter (HOSPITAL_BASED_OUTPATIENT_CLINIC_OR_DEPARTMENT_OTHER): Payer: Self-pay | Admitting: Surgery

## 2021-09-02 ENCOUNTER — Other Ambulatory Visit (HOSPITAL_COMMUNITY): Payer: Self-pay

## 2021-09-11 ENCOUNTER — Telehealth: Payer: Self-pay | Admitting: Physician Assistant

## 2021-09-11 ENCOUNTER — Other Ambulatory Visit (HOSPITAL_COMMUNITY): Payer: Self-pay

## 2021-09-11 NOTE — Telephone Encounter (Signed)
Due to pt's treatment for breast cancer she is now on Tamoxifen. She reports that interferes with her Duloxetine 120 mg. She is asking how she needs to taper off. She is also requesting to restart Lexapro and that would be compatible.   Her last apt was in August 2022, she does well and only has f/up every 6 months usually.

## 2021-09-11 NOTE — Telephone Encounter (Signed)
Pt called and said that she is on  tamoxifen and the cymbalta that she is taking doesn't let that medicine work. She needs to know how to taper off the cymbalta. She can take lexapro and that won't interfere. Please call her at [phone]

## 2021-09-15 NOTE — Telephone Encounter (Signed)
Will need to decrease Cymbalta by 30 mg each week over the next 3 weeks.  Let me know which pharmacy and I will send in enough 30 mg pills to have her wean off. On the Lexapro we can go ahead and start that at the same time as a weaning off of the Cymbalta.  I will send in a 20 mg pill, she will take 1/2 pill daily for the first 2 weeks and then go up to 1 pill daily after that.   If she has any problems with this transition, let me know.

## 2021-09-16 ENCOUNTER — Other Ambulatory Visit: Payer: Self-pay

## 2021-09-16 ENCOUNTER — Other Ambulatory Visit (HOSPITAL_COMMUNITY): Payer: Self-pay

## 2021-09-16 MED ORDER — DULOXETINE HCL 30 MG PO CPEP
ORAL_CAPSULE | ORAL | 0 refills | Status: DC
  Filled 2021-09-16: qty 30, 30d supply, fill #0

## 2021-09-16 MED ORDER — ESCITALOPRAM OXALATE 20 MG PO TABS
ORAL_TABLET | ORAL | 0 refills | Status: DC
  Filled 2021-09-16: qty 30, 30d supply, fill #0

## 2021-09-16 NOTE — Telephone Encounter (Signed)
Rtc to pt and discussed taper of Duloxetine and starting Lexapro. She verbalized understanding. Both Rx's for Duloxetine 30 mg sent along with her Lexapro 20 mg to [facility].

## 2021-09-17 ENCOUNTER — Encounter: Payer: Self-pay | Admitting: Radiology

## 2021-09-20 NOTE — Progress Notes (Incomplete)
Radiation Oncology         [phone] ________________________________  Patient Name: Jennifer Davis MRN: [id number] DOB: [birth year] Referring Physician: Nicholas Lose (Profile Not Attached) Date of Service: 08/22/2021 [facility], [place]  End Of Treatment Note  Diagnoses: C50.412-Malignant neoplasm of upper-outer quadrant of left female breast  Cancer Staging: Stage IA (pT1c, pN1a, cM0) Left Breast UOQ, Invasive Ductal Carcinoma, ER+ / PR+ / Her2-, Grade 2  Intent: Curative  Radiation Treatment Dates: 07/09/2021 through 08/22/2021 Site Technique Total Dose (Gy) Dose per Fx (Gy) Completed Fx Beam Energies  Chest Wall, Left: CW_Lt 3D 50/50 2 25/25 10X, 15X  Sclav-LT: SCV_Lt 3D 50/50 2 25/25 10X, 15X  Chest Wall, Left: CW_Lt_Bst Electron 10/10 2 5/5 6E   Narrative: The patient tolerated radiation therapy relatively well. She reports having some discomfort to left chest wall. Patient reports using Neopsorin for peeling skin to left chest wall and left axilla. She reports mild swelling and tenderness.  She has minimal fatigue at this time.  Continues to have full range of motion to left arm. Reports having a good energy level.   Plan: The patient will follow-up with radiation oncology in one month .  ________________________________________________ -----------------------------------  Blair Promise, PhD, MD  This document serves as a record of services personally performed by Gery Pray, MD. It was created on his behalf by Roney Mans, a trained medical scribe. The creation of this record is based on the scribe's personal observations and the provider's statements to them. This document has been checked and approved by the attending provider.

## 2021-09-21 NOTE — Progress Notes (Incomplete)
Radiation Oncology         [phone] ________________________________  Name: Jennifer Davis MRN: [id number]  Date: 09/22/2021  DOB: 1976-01-24  Follow-Up Visit Note  CC: Wenda Low, MD  Nicholas Lose, MD  No diagnosis found.  Diagnosis: Stage IA (pT1c, pN1a, cM0) Left Breast UOQ, Invasive Ductal Carcinoma, ER+ / PR+ / Her2-, Grade 2   Interval Since Last Radiation: 1 month  Intent: Curative  Radiation Treatment Dates: 07/09/2021 through 08/22/2021 Site Technique Total Dose (Gy) Dose per Fx (Gy) Completed Fx Beam Energies  Chest Wall, Left: CW_Lt 3D 50/50 2 25/25 10X, 15X  Sclav-LT: SCV_Lt 3D 50/50 2 25/25 10X, 15X  Chest Wall, Left: CW_Lt_Bst Electron 10/10 2 5/5 6E    Narrative:  The patient returns today for routine follow-up.  The patient tolerated radiation therapy relatively well with the exception of some discomfort to her left chest wall, as well as mild swelling and tenderness. Patient reported using Neopsorin for peeling skin to left chest wall and left axilla.    The patient most recently followed up with Dr. Lindi Adie on 08/21/21 to discuss antiestrogen treatment. Following discussion of the risks and benefits, the patient opted to proceed with treatment consisting of letrozole 2.5 mg daily. (Expected start date on 09/09/21).   Otherwise, no significant interval history since the patient was last seen.                   Allergies:  is allergic to bee venom and other.  Meds: Current Outpatient Medications  Medication Sig Dispense Refill   acetaminophen (TYLENOL) 500 MG tablet TAKE 1 TABLET (500 MG TOTAL) BY MOUTH EVERY 6 (SIX) HOURS AS NEEDED. FOR USE AFTER SURGERY (Patient taking differently: Take 500 mg by mouth every 6 (six) hours as needed for mild pain, fever or headache.) 30 tablet 0   albuterol (PROAIR HFA) 108 (90 Base) MCG/ACT inhaler Inhale 2 puffs into the lungs every 4-6 hours as needed 8.5 g 0   Biotin w/ Vitamins C & E (HAIR/SKIN/NAILS PO) Take by  mouth.     budesonide-formoterol (SYMBICORT) 160-4.5 MCG/ACT inhaler INHALE 2 PUFFS BY MOUTH 2 TIMES A DAY 10.2 g 5   cetirizine (ZYRTEC) 10 MG tablet Take 10 mg by mouth daily.     DULoxetine (CYMBALTA) 30 MG capsule Take 1 capsule (30 mg) by mouth daily with a 60 mg capsule X 7 days, then decrease to 60 mg by mouth daily for 7 days, then decrease down to 30 mg daily for 7 days then stop. 30 capsule 0   DULoxetine (CYMBALTA) 60 MG capsule Take 2 capsules (120 mg total) by mouth daily. 180 capsule 1   EPINEPHrine (EPIPEN 2-PAK) 0.3 mg/0.3 mL IJ SOAJ injection Inject 0.3 mLs (0.3 mg total) into the muscle once as needed for up to 1 dose (for severe allergic reaction). CAll 911 immediately if you have to use this medicine 1 each 1   EPINEPHrine 0.3 mg/0.3 mL IJ SOAJ injection Use as directed in case of anaphlylaxis 2 each 1   escitalopram (LEXAPRO) 20 MG tablet Take 1/2 tablet (10 mg) by mouth daily X 14 days, then increase to 1 tablet (20 mg) daily 30 tablet 0   ibuprofen (ADVIL) 600 MG tablet TAKE 1 TABLET (600 MG TOTAL) BY MOUTH EVERY 6 (SIX) HOURS AS NEEDED FOR MILD PAIN OR MODERATE PAIN. FOR USE AFTER SURGERY 30 tablet 0   montelukast (SINGULAIR) 10 MG tablet Take 10 mg by mouth at bedtime.  5   Multiple Vitamin (MULTIVITAMIN WITH MINERALS) TABS tablet Take 1 tablet by mouth daily.     PROAIR HFA 108 (90 Base) MCG/ACT inhaler Inhale 2 puffs into the lungs as needed for wheezing or shortness of breath.  0   tamoxifen (NOLVADEX) 20 MG tablet Take 1 tablet (20 mg total) by mouth daily. 90 tablet 3   No current facility-administered medications for this encounter.    Physical Findings: The patient is in no acute distress. Patient is alert and oriented.  vitals were not taken for this visit. .  No significant changes. Lungs are clear to auscultation bilaterally. Heart has regular rate and rhythm. No palpable cervical, supraclavicular, or axillary adenopathy. Abdomen soft, non-tender, normal bowel  sounds.   Lab Findings: Lab Results  Component Value Date   WBC 7.1 06/19/2021   HGB 9.9 (L) 06/19/2021   HCT 31.7 (L) 06/19/2021   MCV 89.0 06/19/2021   PLT 465 (H) 06/19/2021    Radiographic Findings: No results found.  Impression:  Stage IA (pT1c, pN1a, cM0) Left Breast UOQ, Invasive Ductal Carcinoma, ER+ / PR+ / Her2-, Grade 2   The patient is recovering from the effects of radiation.  ***  Plan:  ***   *** minutes of total time was spent for this patient encounter, including preparation, face-to-face counseling with the patient and coordination of care, physical exam, and documentation of the encounter. ____________________________________  Blair Promise, PhD, MD   This document serves as a record of services personally performed by Gery Pray, MD. It was created on his behalf by Roney Mans, a trained medical scribe. The creation of this record is based on the scribe's personal observations and the provider's statements to them. This document has been checked and approved by the attending provider.

## 2021-09-22 ENCOUNTER — Ambulatory Visit
Admission: RE | Admit: 2021-09-22 | Discharge: 2021-09-22 | Disposition: A | Discharge status: Home / Self Care | Payer: 59 | Source: Ambulatory Visit | Attending: Radiation Oncology | Admitting: Radiation Oncology

## 2021-09-22 DIAGNOSIS — Z170 Estrogen receptor positive status [ER+]: Secondary | ICD-10-CM | POA: Insufficient documentation

## 2021-09-22 DIAGNOSIS — C50412 Malignant neoplasm of upper-outer quadrant of left female breast: Secondary | ICD-10-CM | POA: Insufficient documentation

## 2021-09-23 ENCOUNTER — Telehealth: Payer: Self-pay | Admitting: *Deleted

## 2021-09-23 NOTE — Telephone Encounter (Signed)
CALLED PATIENT TO ASK ABOUT RESCHEDULING  MISSED FU FROM 09-22-21, PATIENT AGREED TO COME ON 10-23-21 @ 4:15 PM

## 2021-09-26 DIAGNOSIS — J309 Allergic rhinitis, unspecified: Secondary | ICD-10-CM | POA: Insufficient documentation

## 2021-09-26 DIAGNOSIS — E559 Vitamin D deficiency, unspecified: Secondary | ICD-10-CM | POA: Insufficient documentation

## 2021-09-26 DIAGNOSIS — M1610 Unilateral primary osteoarthritis, unspecified hip: Secondary | ICD-10-CM | POA: Insufficient documentation

## 2021-10-02 MED FILL — Budesonide-Formoterol Fumarate Dihyd Aerosol 160-4.5 MCG/ACT: RESPIRATORY_TRACT | 30 days supply | Qty: 10.2 | Fill #3 | Status: AC

## 2021-10-03 ENCOUNTER — Other Ambulatory Visit (HOSPITAL_COMMUNITY): Payer: Self-pay

## 2021-10-06 ENCOUNTER — Other Ambulatory Visit (HOSPITAL_COMMUNITY): Payer: Self-pay

## 2021-10-06 MED ORDER — ALBUTEROL SULFATE HFA 108 (90 BASE) MCG/ACT IN AERS
2.0000 | INHALATION_SPRAY | RESPIRATORY_TRACT | 0 refills | Status: DC | PRN
  Filled 2021-10-06: qty 8.5, 17d supply, fill #0

## 2021-10-08 ENCOUNTER — Other Ambulatory Visit (HOSPITAL_COMMUNITY): Payer: Self-pay

## 2021-10-08 MED ORDER — PREDNISONE 10 MG PO TABS
ORAL_TABLET | ORAL | 0 refills | Status: AC
  Filled 2021-10-08: qty 10, 4d supply, fill #0

## 2021-10-12 ENCOUNTER — Ambulatory Visit
Admission: EM | Admit: 2021-10-12 | Discharge: 2021-10-12 | Disposition: A | Discharge status: Home / Self Care | Payer: 59 | Attending: Internal Medicine | Admitting: Internal Medicine

## 2021-10-12 DIAGNOSIS — Z20822 Contact with and (suspected) exposure to covid-19: Secondary | ICD-10-CM | POA: Diagnosis not present

## 2021-10-12 DIAGNOSIS — J069 Acute upper respiratory infection, unspecified: Secondary | ICD-10-CM

## 2021-10-12 DIAGNOSIS — R6889 Other general symptoms and signs: Secondary | ICD-10-CM

## 2021-10-12 MED ORDER — OSELTAMIVIR PHOSPHATE 75 MG PO CAPS: 75.0000 mg | ORAL_CAPSULE | Freq: Two times a day (BID) | ORAL | 0 refills | Status: DC

## 2021-10-12 MED ORDER — BENZONATATE 100 MG PO CAPS: 100.0000 mg | ORAL_CAPSULE | Freq: Three times a day (TID) | ORAL | 0 refills | Status: DC | PRN

## 2021-10-12 NOTE — Discharge Instructions (Signed)
It appears that you have a viral upper respiratory infection that is most likely the flu given your close exposure.  You have been prescribed Tamiflu and a cough medication.  COVID-19 and flu test is pending.  We will call if it is positive.  Please follow-up if symptoms persist.

## 2021-10-12 NOTE — ED Triage Notes (Signed)
3 day h/o scratchy throat, post nasal drip, HA and cough. Denies sore throat and dysphagia. Pt is currently on prednisone due to increased asthma flare ups within the last two weeks. Afebrile. Husband was exposed to flu and is currently feeling ill. Pt is requesting a flu test.

## 2021-10-12 NOTE — ED Provider Notes (Signed)
[facility]    CSN: [id number] Arrival date & time: 10/12/21  0847      History   Chief Complaint Chief Complaint  Patient presents with   Cough    HPI Jennifer Davis is a 45 y.o. female.   Patient presents with 3-day history of scratchy throat, postnasal drip, nasal congestion, headache, nonproductive cough. Denies sore throat, ear pain, nausea, vomiting, diarrhea, chest pain.  Patient does endorse some mild shortness of breath but reports that she has had an asthma exacerbation the past 2 weeks.  Patient reports that she saw her pulmonologist in September and was prescribed prednisone steroid to take as needed as she commonly has asthma exacerbations during this time of the year.  Patient started taking prednisone approximately 3 days ago with improvement in shortness of breath.  Has been using albuterol inhaler as well with improvement.  Denies any known fevers.  Patient reports that her husband has been exposed to the flu, and his symptoms started prior to hers.  Medical history includes breast cancer and asthma.   Cough  Past Medical History:  Diagnosis Date   Anxiety    Asthma    Asthma due to seasonal allergies    Breast cancer ([place])    Dyspnea    after chemo - fatiqued   GERD (gastroesophageal reflux disease)    occasionl   History of radiation therapy    left chest wall 07/09/2021-08/22/2021   Dr Gery Pray   Palpitations 2017   tachy    PTSD (post-traumatic stress disorder)     Patient Active Problem List   Diagnosis Date Noted   Breast wound, right, initial encounter 03/28/2021   Acquired absence of breast 02/28/2021   Port-A-Cath in place 01/22/2021   S/P mastectomy, bilateral 12/12/2020   Breast cancer ([place]) 12/04/2020   Genetic testing 10/02/2020   Malignant neoplasm of upper-outer quadrant of left breast in female, estrogen receptor positive ([place]) 09/19/2020   Depression 08/06/2018   PTSD (post-traumatic stress disorder) 08/06/2018    Palpitations 01/09/2016   Shortness of breath 01/09/2016   Anxiety 01/09/2016   Asthma 01/09/2016   Morbid obesity ([place]) 01/09/2016    Past Surgical History:  Procedure Laterality Date   BREAST RECONSTRUCTION WITH PLACEMENT OF TISSUE EXPANDER AND FLEX HD (ACELLULAR HYDRATED DERMIS) Bilateral 12/04/2020   Procedure: IMMEDIATE BILATERAL BREAST RECONSTRUCTION WITH PLACEMENT OF TISSUE EXPANDER AND FLEX HD (ACELLULAR HYDRATED DERMIS);  Surgeon: Wallace Going, DO;  Location: [place];  Service: Plastics;  Laterality: Bilateral;   CESAREAN SECTION     x3   DEBRIDEMENT AND CLOSURE WOUND Right 04/10/2021   Procedure: excision of right breast wound with closure;  Surgeon: Wallace Going, DO;  Location: [place];  Service: Plastics;  Laterality: Right;   MASTECTOMY WITH AXILLARY LYMPH NODE DISSECTION Left 12/04/2020   Procedure: BILATERAL MASTECTOMIES, RADIOACTIVE SEED GUIDED TARGETED LEFT AXILLARY NODE DISSECTION;  Surgeon: Coralie Keens, MD;  Location: [place];  Service: General;  Laterality: Left;   PORT-A-CATH REMOVAL Left 08/27/2021   Procedure: REMOVAL PORT-A-CATH;  Surgeon: Coralie Keens, MD;  Location: [place];  Service: General;  Laterality: Left;   PORTACATH PLACEMENT Left 12/04/2020   Procedure: INSERTION PORT-A-CATH LEFT SUBCLAVIAN;  Surgeon: Coralie Keens, MD;  Location: [place];  Service: General;  Laterality: Left;   REMOVAL OF TISSUE EXPANDER AND PLACEMENT OF IMPLANT Right 01/04/2021   Procedure: REMOVAL OF TISSUE EXPANDER AND PLACEMENT OF NEW  EXPANDER;  Surgeon: Wallace Going, DO;  Location: [place];  Service: Plastics;  Laterality: Right;   TISSUE EXPANDER PLACEMENT Right 03/19/2021   Procedure: Removal of right tissue expander;  Surgeon: Wallace Going, DO;  Location: [place];  Service: Plastics;  Laterality: Right;  45 min    OB History   No obstetric history on  file.      Home Medications    Prior to Admission medications   Medication Sig Start Date End Date Taking? Authorizing Provider  benzonatate (TESSALON) 100 MG capsule Take 1 capsule (100 mg total) by mouth every 8 (eight) hours as needed for cough. 10/12/21  Yes , Michele Rockers, FNP  oseltamivir (TAMIFLU) 75 MG capsule Take 1 capsule (75 mg total) by mouth every 12 (twelve) hours. 10/12/21  Yes , Hildred Alamin E, FNP  acetaminophen (TYLENOL) 500 MG tablet TAKE 1 TABLET (500 MG TOTAL) BY MOUTH EVERY 6 (SIX) HOURS AS NEEDED. FOR USE AFTER SURGERY Patient taking differently: Take 500 mg by mouth every 6 (six) hours as needed for mild pain, fever or headache. 11/19/20 11/19/21  Young, Venetia Night C, PA-C  albuterol (VENTOLIN HFA) 108 (90 Base) MCG/ACT inhaler Inhale 2 puffs into the lungs every 4-6 hours as needed. 10/06/21     Biotin w/ Vitamins C & E (HAIR/SKIN/NAILS PO) Take by mouth.    [provider]  budesonide-formoterol (SYMBICORT) 160-4.5 MCG/ACT inhaler INHALE 2 PUFFS BY MOUTH 2 TIMES A DAY 12/26/20 12/26/21  Tiajuana Amass, MD  cetirizine (ZYRTEC) 10 MG tablet Take 10 mg by mouth daily.    [provider]  DULoxetine (CYMBALTA) 30 MG capsule Take 1 capsule (30 mg) by mouth daily with a 60 mg capsule X 7 days, then decrease to 60 mg by mouth daily for 7 days, then decrease down to 30 mg daily for 7 days then stop. 09/16/21   Addison Lank, PA-C  DULoxetine (CYMBALTA) 60 MG capsule Take 2 capsules (120 mg total) by mouth daily. 07/08/21   Addison Lank, PA-C  EPINEPHrine (EPIPEN 2-PAK) 0.3 mg/0.3 mL IJ SOAJ injection Inject 0.3 mLs (0.3 mg total) into the muscle once as needed for up to 1 dose (for severe allergic reaction). CAll 911 immediately if you have to use this medicine 07/05/19   Margarita Mail, PA-C  EPINEPHrine 0.3 mg/0.3 mL IJ SOAJ injection Use as directed in case of anaphlylaxis 08/04/21     escitalopram (LEXAPRO) 20 MG tablet Take 1/2 tablet (10 mg) by mouth daily X 14  days, then increase to 1 tablet (20 mg) daily 09/16/21   Donnal Moat T, PA-C  ibuprofen (ADVIL) 600 MG tablet TAKE 1 TABLET (600 MG TOTAL) BY MOUTH EVERY 6 (SIX) HOURS AS NEEDED FOR MILD PAIN OR MODERATE PAIN. FOR USE AFTER SURGERY 11/19/20 11/19/21  Phoebe Sharps C, PA-C  montelukast (SINGULAIR) 10 MG tablet Take 10 mg by mouth at bedtime. 11/18/15   [provider]  Multiple Vitamin (MULTIVITAMIN WITH MINERALS) TABS tablet Take 1 tablet by mouth daily.    [provider]  predniSONE (DELTASONE) 10 MG tablet Take 4 tablets (40 mg total) by mouth daily for 1 day, THEN 3 tablets (30 mg total) daily for 1 day, THEN 2 tablets (20 mg total) daily for 1 day, THEN 1 tablet (10 mg total) daily for 1 day. 08/04/21 10/13/21  Tiajuana Amass, MD  PROAIR HFA 108 [phone] Base) MCG/ACT inhaler Inhale 2 puffs into the lungs as needed for wheezing or shortness of breath.  10/29/15   [provider]  tamoxifen (NOLVADEX) 20 MG tablet Take 1 tablet (20 mg total) by mouth daily. 08/21/21   Nicholas Lose, MD  Investigational olanzapine/placebo 10 MG capsule URCC 16070 Blister Card 2 Take 1 capsule by mouth daily. Take in the morning on Days 2-4. 02/05/21 02/27/21  Nicholas Lose, MD    Family History Family History  Problem Relation Age of Onset   Hypertension Mother    Fibromyalgia Mother    Colon cancer Mother 50   Asthma Sister        x2   Delorse Limber White syndrome Maternal Aunt 43   Hypertension Maternal Grandmother    Hypertension Maternal Grandfather    Cancer - Lung Maternal Grandfather    Stroke Maternal Grandfather    Colon cancer Other        MGM's niece, dx unknown age   Colon cancer Other        MGM's nephew; dx unknown age   Breast cancer Neg Hx     Social History Social History   Tobacco Use   Smoking status: Former    Types: Cigarettes    Quit date: 01/09/2012    Years since quitting: 9.7   Smokeless tobacco: Never   Tobacco comments:    social smoker  Substance Use  Topics   Alcohol use: Not Currently    Alcohol/week: 0.0 standard drinks    Comment: very rare   Drug use: Never     Allergies   Bee venom and Other   Review of Systems Review of Systems Per HPI  Physical Exam Triage Vital Signs ED Triage Vitals  Enc Vitals Group     BP 10/12/21 0941 (!) 150/87     Pulse Rate 10/12/21 0941 83     Resp 10/12/21 0941 18     Temp 10/12/21 0941 98.6 F (37 C)     Temp Source 10/12/21 0941 Oral     SpO2 10/12/21 0941 96 %     Weight --      Height --      Head Circumference --      Peak Flow --      Pain Score 10/12/21 0944 4     Pain Loc --      Pain Edu? --      Excl. in [place]? --    No data found.  Updated Vital Signs BP (!) 150/87 (BP Location: Right Arm)   Pulse 83   Temp 98.6 F (37 C) (Oral)   Resp 18   SpO2 96%   Visual Acuity Right Eye Distance:   Left Eye Distance:   Bilateral Distance:    Right Eye Near:   Left Eye Near:    Bilateral Near:     Physical Exam Constitutional:      General: She is not in acute distress.    Appearance: Normal appearance. She is not toxic-appearing or diaphoretic.  HENT:     Head: Normocephalic and atraumatic.     Right Ear: Tympanic membrane and ear canal normal.     Left Ear: Tympanic membrane and ear canal normal.     Nose: Congestion present.     Mouth/Throat:     Mouth: Mucous membranes are moist.     Pharynx: No posterior oropharyngeal erythema.  Eyes:     Extraocular Movements: Extraocular movements intact.     Conjunctiva/sclera: Conjunctivae normal.     Pupils: Pupils are equal, round, and reactive to light.  Cardiovascular:  Rate and Rhythm: Normal rate and regular rhythm.     Pulses: Normal pulses.     Heart sounds: Normal heart sounds.  Pulmonary:     Effort: Pulmonary effort is normal. No respiratory distress.     Breath sounds: Normal breath sounds. No stridor. No wheezing, rhonchi or rales.  Abdominal:     General: Abdomen is flat. Bowel sounds are normal.      Palpations: Abdomen is soft.  Musculoskeletal:        General: Normal range of motion.     Cervical back: Normal range of motion.  Skin:    General: Skin is warm and dry.  Neurological:     General: No focal deficit present.     Mental Status: She is alert and oriented to person, place, and time. Mental status is at baseline.  Psychiatric:        Mood and Affect: Mood normal.        Behavior: Behavior normal.     UC Treatments / Results  Labs (all labs ordered are listed, but only abnormal results are displayed) Labs Reviewed  COVID-19, FLU A+B NAA    EKG   Radiology No results found.  Procedures Procedures (including critical care time)  Medications Ordered in UC Medications - No data to display  Initial Impression / Assessment and Plan / UC Course  I have reviewed the triage vital signs and the nursing notes.  Pertinent labs & imaging results that were available during my care of the patient were reviewed by me and considered in my medical decision making (see chart for details).     Patient presents with symptoms likely from a viral upper respiratory infection. Differential includes bacterial pneumonia, sinusitis, allergic rhinitis, Covid 19, flu. Do not suspect underlying cardiopulmonary process. Symptoms seem unlikely related to ACS, CHF or COPD exacerbations, pneumonia, pneumothorax. Patient is nontoxic appearing and not in need of emergent medical intervention.  COVID-19 and flu test is pending.  Highly suspicious for the flu given patient's close exposure.  Will opt to treat with Tamiflu given patient's lower immune system and history of asthma.  Patient to continue prednisone prescribed by pulmonology.  Do not think that chest imaging is necessary at this time given no adventitious lung sounds on exam and shortness of breath is improving.  She also does not have any fever.  Recommended symptom control with over the counter medications.   Return if symptoms  fail to improve in 1-2 weeks. Patient states understanding and is agreeable.  Discharged with PCP followup.  Final Clinical Impressions(s) / UC Diagnoses   Final diagnoses:  Viral upper respiratory tract infection with cough  Flu-like symptoms  Encounter for laboratory testing for COVID-19 virus     Discharge Instructions      It appears that you have a viral upper respiratory infection that is most likely the flu given your close exposure.  You have been prescribed Tamiflu and a cough medication.  COVID-19 and flu test is pending.  We will call if it is positive.  Please follow-up if symptoms persist.    ED Prescriptions     Medication Sig Dispense Auth. Provider   oseltamivir (TAMIFLU) 75 MG capsule Take 1 capsule (75 mg total) by mouth every 12 (twelve) hours. 10 capsule [place], [place] E, [place]   benzonatate (TESSALON) 100 MG capsule Take 1 capsule (100 mg total) by mouth every 8 (eight) hours as needed for cough. 21 capsule [place], Michele Rockers, [place]  PDMP not reviewed this encounter.   Teodora Medici, [place] 10/12/21 [phone]

## 2021-10-13 LAB — COVID-19, FLU A+B NAA
Influenza A, NAA: NOT DETECTED
Influenza B, NAA: NOT DETECTED
SARS-CoV-2, NAA: NOT DETECTED

## 2021-10-17 ENCOUNTER — Encounter: Payer: Self-pay | Admitting: Radiology

## 2021-10-22 NOTE — Progress Notes (Signed)
Radiation Oncology         [phone] ________________________________  Name: Jennifer Davis MRN: [id number]  Date: 10/23/2021  DOB: 03-Jan-1976  Follow-Up Visit Note  CC: Wenda Low, MD  Nicholas Lose, MD    ICD-10-CM   1. Malignant neoplasm of upper-outer quadrant of left breast in female, estrogen receptor positive ([place])  C50.412    Z17.0       Diagnosis: Stage IA (pT1c, pN1a, cM0) Left Breast UOQ, Invasive Ductal Carcinoma, ER+ / PR+ / Her2-, Grade 2   Interval Since Last Radiation: 2 months and 1 day   Intent: Curative  Radiation Treatment Dates: 07/09/2021 through 08/22/2021 Site Technique Total Dose (Gy) Dose per Fx (Gy) Completed Fx Beam Energies  Chest Wall, Left: CW_Lt 3D 50/50 2 25/25 10X, 15X  Sclav-LT: SCV_Lt 3D 50/50 2 25/25 10X, 15X  Chest Wall, Left: CW_Lt_Bst Electron 10/10 2 5/5 6E    Narrative:  The patient returns today for routine follow-up.  The patient tolerated radiation therapy relatively well with the exception of some discomfort to her left chest wall, as well as mild swelling and tenderness. Patient reported using Neopsorin for peeling skin to left chest wall and left axilla.    The patient most recently followed up with Dr. Lindi Adie on 08/21/21 to discuss antiestrogen treatment. Following discussion of the risks and benefits, the patient opted to proceed with treatment consisting of tamoxifen 72m daily. (Expected start date on 09/09/21).   On 10/12/21, the patient presented to the ED on 10/12/21 with a  3-day history of scratchy throat, postnasal drip, nasal congestion, headache, and nonproductive cough. Patient also endorsed mild SOB but reported a recent asthma exacerbation the past 2 weeks.  Patient reported that she saw her pulmonologist in September and was prescribed prednisone steroid to take as needed, (as she commonly has asthma exacerbations during this time of the year).  Patient started taking prednisone approximately 3 days prior with  improvement in shortness of breath. She also had been using albuterol inhaler well with improvement. Patient did report exposure to the Flu recently. Accordingly, the patient was given Tamiflu due to her immunocompromised state and history of asthma, and discharged home.   Patient denies any itching or discomfort along the left chest region.  She has minimal swelling in her left arm and hand.  She denies any cough or breathing problems.                  Allergies:  is allergic to bee venom and other.  Meds: Current Outpatient Medications  Medication Sig Dispense Refill   acetaminophen (TYLENOL) 500 MG tablet TAKE 1 TABLET (500 MG TOTAL) BY MOUTH EVERY 6 (SIX) HOURS AS NEEDED. FOR USE AFTER SURGERY (Patient taking differently: Take 500 mg by mouth every 6 (six) hours as needed for mild pain, fever or headache.) 30 tablet 0   albuterol (VENTOLIN HFA) 108 (90 Base) MCG/ACT inhaler Inhale 2 puffs into the lungs every 4-6 hours as needed. 8.5 g 0   Biotin w/ Vitamins C & E (HAIR/SKIN/NAILS PO) Take by mouth.     budesonide-formoterol (SYMBICORT) 160-4.5 MCG/ACT inhaler INHALE 2 PUFFS BY MOUTH 2 TIMES A DAY 10.2 g 5   cetirizine (ZYRTEC) 10 MG tablet Take 10 mg by mouth daily.     EPINEPHrine (EPIPEN 2-PAK) 0.3 mg/0.3 mL IJ SOAJ injection Inject 0.3 mLs (0.3 mg total) into the muscle once as needed for up to 1 dose (for severe allergic reaction). CAll 911 immediately  if you have to use this medicine 1 each 1   EPINEPHrine 0.3 mg/0.3 mL IJ SOAJ injection Use as directed in case of anaphlylaxis 2 each 1   escitalopram (LEXAPRO) 20 MG tablet Take 1/2 tablet (10 mg) by mouth daily X 14 days, then increase to 1 tablet (20 mg) daily 30 tablet 0   ibuprofen (ADVIL) 600 MG tablet TAKE 1 TABLET (600 MG TOTAL) BY MOUTH EVERY 6 (SIX) HOURS AS NEEDED FOR MILD PAIN OR MODERATE PAIN. FOR USE AFTER SURGERY 30 tablet 0   montelukast (SINGULAIR) 10 MG tablet Take 10 mg by mouth at bedtime.  5   Multiple Vitamin  (MULTIVITAMIN WITH MINERALS) TABS tablet Take 1 tablet by mouth daily.     PROAIR HFA 108 (90 Base) MCG/ACT inhaler Inhale 2 puffs into the lungs as needed for wheezing or shortness of breath.  0   tamoxifen (NOLVADEX) 20 MG tablet Take 1 tablet (20 mg total) by mouth daily. 90 tablet 3   benzonatate (TESSALON) 100 MG capsule Take 1 capsule (100 mg total) by mouth every 8 (eight) hours as needed for cough. (Patient not taking: Reported on 10/23/2021) 21 capsule 0   DULoxetine (CYMBALTA) 30 MG capsule Take 1 capsule (30 mg) by mouth daily with a 60 mg capsule X 7 days, then decrease to 60 mg by mouth daily for 7 days, then decrease down to 30 mg daily for 7 days then stop. (Patient not taking: Reported on 10/23/2021) 30 capsule 0   DULoxetine (CYMBALTA) 60 MG capsule Take 2 capsules (120 mg total) by mouth daily. (Patient not taking: Reported on 10/23/2021) 180 capsule 1   No current facility-administered medications for this encounter.    Physical Findings: The patient is in no acute distress. Patient is alert and oriented.  height is 4' 11.5" (1.511 m) and weight is 217 lb 9.6 oz (98.7 kg). Her temperature is 97.5 F (36.4 C) (abnormal). Her blood pressure is 147/90 (abnormal) and her pulse is 80. Her respiration is 20 and oxygen saturation is 99%. .   Lungs are clear to auscultation bilaterally. Heart has regular rate and rhythm. No palpable cervical, supraclavicular, or axillary adenopathy. Abdomen soft, non-tender, normal bowel sounds.  Examination left chest area reveals reconstructed breast with tissue expander in place.  Hyperpigmentation changes noted.  Skin is healed well.  No palpable or visible signs of recurrence along the left chest region.  Mastectomy scar noted along the right side with tissue expander removed.    Lab Findings: Lab Results  Component Value Date   WBC 7.1 06/19/2021   HGB 9.9 (L) 06/19/2021   HCT 31.7 (L) 06/19/2021   MCV 89.0 06/19/2021   PLT 465 (H)  06/19/2021    Radiographic Findings: No results found.  Impression:  Stage IA (pT1c, pN1a, cM0) Left Breast UOQ, Invasive Ductal Carcinoma, ER+ / PR+ / Her2-, Grade 2   The patient is recovering from the effects of radiation.  No evidence of recurrence on clinical exam today.  Plan: As needed follow-up in radiation oncology.  Patient will continue to have close follow-up with medical oncology.  She will be seeing Dr. Marla Roe to initiate further breast reconstruction issues. She reports she may require a TRAM flap along the right side.   ____________________________________  Blair Promise, PhD, MD   This document serves as a record of services personally performed by Gery Pray, MD. It was created on his behalf by Roney Mans, a trained medical scribe. The creation  of this record is based on the scribe's personal observations and the provider's statements to them. This document has been checked and approved by the attending provider.

## 2021-10-23 ENCOUNTER — Ambulatory Visit
Admission: RE | Admit: 2021-10-23 | Discharge: 2021-10-23 | Disposition: A | Discharge status: Home / Self Care | Payer: 59 | Source: Ambulatory Visit | Attending: Hematology and Oncology | Admitting: Hematology and Oncology

## 2021-10-23 ENCOUNTER — Encounter: Payer: Self-pay | Admitting: Radiation Oncology

## 2021-10-23 VITALS — BP 147/90 | HR 80 | Temp 97.5°F | Resp 20 | Ht 59.5 in | Wt 217.6 lb

## 2021-10-23 DIAGNOSIS — C50412 Malignant neoplasm of upper-outer quadrant of left female breast: Secondary | ICD-10-CM | POA: Diagnosis not present

## 2021-10-23 DIAGNOSIS — Z170 Estrogen receptor positive status [ER+]: Secondary | ICD-10-CM | POA: Diagnosis not present

## 2021-10-23 NOTE — Progress Notes (Signed)
Jennifer Davis is here today for follow up post radiation to the breast.   Breast Side:Left   They completed their radiation on: 08/22/21  Does the patient complain of any of the following: Post radiation skin issues: Patient reports skin has greatly improved.  Breast Tenderness: Yes Breast Swelling: Swelling has resolved [place]: Patient reports having swelling to left arm.  Range of Motion limitations: Patient has full range of motion.  Fatigue post radiation: Patient reports improvement in energy level. Reports not sleeping well at night. Patient reports a recent change in antidepressant.  Appetite good/fair/poor: Good  Additional comments if applicable:  Vitals:   69/62/95 1603  BP: (!) 147/90  Pulse: 80  Resp: 20  Temp: (!) 97.5 F (36.4 C)  SpO2: 99%  Weight: 217 lb 9.6 oz (98.7 kg)  Height: 4' 11.5" (1.511 m)

## 2021-10-30 DIAGNOSIS — Z0289 Encounter for other administrative examinations: Secondary | ICD-10-CM

## 2021-11-04 ENCOUNTER — Other Ambulatory Visit: Payer: Self-pay | Admitting: Physician Assistant

## 2021-11-05 ENCOUNTER — Other Ambulatory Visit (HOSPITAL_COMMUNITY): Payer: Self-pay

## 2021-11-05 MED ORDER — ESCITALOPRAM OXALATE 20 MG PO TABS
20.0000 mg | ORAL_TABLET | Freq: Every day | ORAL | 1 refills | Status: DC
  Filled 2021-11-05: qty 30, 30d supply, fill #0
  Filled 2021-12-01: qty 30, 30d supply, fill #1

## 2021-11-06 ENCOUNTER — Other Ambulatory Visit (HOSPITAL_COMMUNITY): Payer: Self-pay

## 2021-11-06 DIAGNOSIS — H5213 Myopia, bilateral: Secondary | ICD-10-CM | POA: Diagnosis not present

## 2021-11-18 ENCOUNTER — Other Ambulatory Visit (HOSPITAL_COMMUNITY): Payer: Self-pay

## 2021-11-18 ENCOUNTER — Telehealth: Payer: Self-pay | Admitting: *Deleted

## 2021-11-18 MED FILL — Budesonide-Formoterol Fumarate Dihyd Aerosol 160-4.5 MCG/ACT: RESPIRATORY_TRACT | 30 days supply | Qty: 10.2 | Fill #4 | Status: AC

## 2021-11-21 ENCOUNTER — Inpatient Hospital Stay: Payer: 59 | Attending: Adult Health | Admitting: Adult Health

## 2021-11-21 ENCOUNTER — Encounter: Payer: Self-pay | Admitting: Adult Health

## 2021-11-21 ENCOUNTER — Other Ambulatory Visit: Payer: Self-pay

## 2021-11-21 VITALS — BP 150/77 | HR 77 | Temp 97.0°F | Resp 18 | Wt 215.8 lb

## 2021-11-21 DIAGNOSIS — C50412 Malignant neoplasm of upper-outer quadrant of left female breast: Secondary | ICD-10-CM | POA: Diagnosis not present

## 2021-11-21 DIAGNOSIS — Z170 Estrogen receptor positive status [ER+]: Secondary | ICD-10-CM | POA: Insufficient documentation

## 2021-11-21 DIAGNOSIS — Z79810 Long term (current) use of selective estrogen receptor modulators (SERMs): Secondary | ICD-10-CM | POA: Insufficient documentation

## 2021-11-21 DIAGNOSIS — C773 Secondary and unspecified malignant neoplasm of axilla and upper limb lymph nodes: Secondary | ICD-10-CM | POA: Insufficient documentation

## 2021-11-21 NOTE — Progress Notes (Signed)
SURVIVORSHIP VISIT:  BRIEF ONCOLOGIC HISTORY:  Oncology History  Malignant neoplasm of upper-outer quadrant of left breast in female, estrogen receptor positive ([place])  09/19/2020 Initial Diagnosis   Mammogram in 07/2018 showed probably benign bilateral breast masses that were stable on her last mammogram on 07/26/19. Mammogram on 08/28/20 showed the stable bilateral masses, a new 0.9cm mass at the 1 o'clock position in the left breast, and one abnormal left axillary lymph node with 0.6cm cortical thickening. 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    10/02/2020 Genetic Testing   Negative genetic testing: no pathogenic variants detected in [facility] Common-Hereditary Cancers Panel.  Variants of uncertain significance detected in APC c.6918T>A (p.Asp2306Glu) and POLE  c.2612G>C (p.Ser871Thr).  The report date is November 24. 2021.   The Common Hereditary Cancers Panel offered by [facility] includes sequencing and/or deletion duplication testing of the following 48 genes: APC, ATM, AXIN2, BARD1, BMPR1A, BRCA1, BRCA2, BRIP1, CDH1, CDK4, CDKN2A (p14ARF), CDKN2A (p16INK4a), CHEK2, CTNNA1, DICER1, EPCAM (Deletion/duplication testing only), GREM1 (promoter region deletion/duplication testing only), KIT, MEN1, MLH1, MSH2, MSH3, MSH6, MUTYH, NBN, NF1, NHTL1, PALB2, PDGFRA, PMS2, POLD1, POLE, PTEN, RAD50, RAD51C, RAD51D, RNF43, SDHB, SDHC, SDHD, SMAD4, SMARCA4. STK11, TP53, TSC1, TSC2, and VHL.  The following genes were evaluated for sequence changes only: SDHA and HOXB13 c.251G>A variant only.   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    12/04/2020 Cancer Staging   Staging form: Breast, AJCC 8th Edition - Pathologic  stage from 12/04/2020: Stage IA (pT1c, pN1a, cM0, G2, ER+, PR+, HER2-) - Signed by [name], NP on 12/18/2020 Histologic grading system: 3 grade system    01/22/2021 - 06/19/2021 Chemotherapy   Patient is on Treatment Plan : BREAST ADJUVANT DOSE DENSE AC q14d / PACLitaxel q7d       INTERVAL HISTORY:  Jennifer Davis to review her survivorship care plan detailing her treatment course for breast cancer, as well as monitoring long-term side effects of that treatment, education regarding health maintenance, screening, and overall wellness and health promotion.     Overall, Jennifer Davis reports feeling quite well.  She is taking Tamoxifen daily.  She has been struggling with her weight and has an upcoming appt with [facility] in about 10 days.    She has been experiencing some hot flashes and dry mouth.  She also has some joint aches and pains.  These are manageable for her.  She is taking Lexapro also.    REVIEW OF SYSTEMS:  Review of Systems  Constitutional:  Positive for fatigue. Negative for appetite change, chills, fever and unexpected weight change.  HENT:   Negative for hearing loss, lump/mass, mouth sores and trouble swallowing.   Eyes:  Negative for eye problems and icterus.  Respiratory:  Negative for chest tightness, cough and shortness of breath.   Cardiovascular:  Negative for chest pain, leg swelling and palpitations.  Gastrointestinal:  Negative for abdominal distention, abdominal pain, constipation, diarrhea, nausea and vomiting.  Endocrine: Positive for hot flashes.  Genitourinary:  Negative for difficulty urinating.   Musculoskeletal:  Positive for arthralgias.  Skin:  Negative for itching and rash.  Neurological:  Negative for dizziness, extremity weakness, headaches and numbness.  Hematological:  Negative for adenopathy.  Does not bruise/bleed easily.  Psychiatric/Behavioral:  Negative for depression. The patient is not nervous/anxious.   Breast: Denies  any new nodularity, masses, tenderness, nipple changes, or nipple discharge.      ONCOLOGY TREATMENT TEAM:  1. Surgeon:  Dr. [name]  at [facility] 2. Medical Oncologist: Dr. Lindi Adie  3. Radiation Oncologist: Dr. Sondra Come    PAST MEDICAL/SURGICAL HISTORY:  Past Medical History:  Diagnosis Date   Anxiety    Asthma    Asthma due to seasonal allergies    Breast cancer ([place])    Dyspnea    after chemo - fatiqued   GERD (gastroesophageal reflux disease)    occasionl   History of radiation therapy    left chest wall and subclavian 07/09/2021-08/22/2021   Dr Gery Pray   Palpitations 2017   tachy    PTSD (post-traumatic stress disorder)    Past Surgical History:  Procedure Laterality Date   BREAST RECONSTRUCTION WITH PLACEMENT OF TISSUE EXPANDER AND FLEX HD (ACELLULAR HYDRATED DERMIS) Bilateral 12/04/2020   Procedure: IMMEDIATE BILATERAL BREAST RECONSTRUCTION WITH PLACEMENT OF TISSUE EXPANDER AND FLEX HD (ACELLULAR HYDRATED DERMIS);  Surgeon: Wallace Going, DO;  Location: [place];  Service: Plastics;  Laterality: Bilateral;   CESAREAN SECTION     x3   DEBRIDEMENT AND CLOSURE WOUND Right 04/10/2021   Procedure: excision of right breast wound with closure;  Surgeon: Wallace Going, DO;  Location: [place];  Service: Plastics;  Laterality: Right;   MASTECTOMY WITH AXILLARY LYMPH NODE DISSECTION Left 12/04/2020   Procedure: BILATERAL MASTECTOMIES, RADIOACTIVE SEED GUIDED TARGETED LEFT AXILLARY NODE DISSECTION;  Surgeon: Coralie Keens, MD;  Location: [place];  Service: General;  Laterality: Left;   PORT-A-CATH REMOVAL Left 08/27/2021   Procedure: REMOVAL PORT-A-CATH;  Surgeon: Coralie Keens, MD;  Location: [place];  Service: General;  Laterality: Left;   PORTACATH PLACEMENT Left 12/04/2020   Procedure: INSERTION PORT-A-CATH LEFT SUBCLAVIAN;  Surgeon: Coralie Keens, MD;  Location: [place];  Service: General;  Laterality: Left;   REMOVAL OF TISSUE EXPANDER AND PLACEMENT OF IMPLANT Right 01/04/2021   Procedure: REMOVAL OF TISSUE EXPANDER AND PLACEMENT OF NEW EXPANDER;  Surgeon: Wallace Going, DO;  Location: [place];  Service: Plastics;  Laterality: Right;   TISSUE EXPANDER PLACEMENT Right 03/19/2021   Procedure: Removal of right tissue expander;  Surgeon: Wallace Going, DO;  Location: [place];  Service: Plastics;  Laterality: Right;  45 min     ALLERGIES:  Allergies  Allergen Reactions   Bee Venom Hives    wheezy   Other Itching    environmental allergies sometime activate asthma     CURRENT MEDICATIONS:  Outpatient Encounter Medications as of 11/21/2021  Medication Sig   albuterol (VENTOLIN HFA) 108 (90 Base) MCG/ACT inhaler Inhale 2 puffs into the lungs every 4-6 hours as needed.   Biotin w/ Vitamins C & E (HAIR/SKIN/NAILS PO) Take by mouth.   budesonide-formoterol (SYMBICORT) 160-4.5 MCG/ACT inhaler INHALE 2 PUFFS BY MOUTH 2 TIMES A DAY   cetirizine (ZYRTEC) 10 MG tablet Take 10 mg by mouth daily.   escitalopram (LEXAPRO) 20 MG tablet Take 1 tablet (20 mg total) by mouth daily.   montelukast (SINGULAIR) 10 MG tablet Take 10 mg by mouth at bedtime.   Multiple Vitamin (MULTIVITAMIN WITH MINERALS) TABS tablet Take 1 tablet by mouth daily.   tamoxifen (NOLVADEX) 20 MG tablet Take 1 tablet (20 mg total) by mouth  daily.   [EXPIRED] acetaminophen (TYLENOL) 500 MG tablet TAKE 1 TABLET (500 MG TOTAL) BY MOUTH EVERY 6 (SIX) HOURS AS NEEDED. FOR USE AFTER SURGERY (Patient taking differently: Take 500 mg by mouth every 6 (six) hours as needed for mild pain, fever or headache.)   benzonatate (TESSALON) 100 MG capsule Take 1 capsule (100 mg total) by mouth every 8 (eight) hours as needed for cough. (Patient not taking: Reported on 10/23/2021)   EPINEPHrine (EPIPEN 2-PAK) 0.3 mg/0.3 mL IJ SOAJ injection Inject 0.3 mLs (0.3 mg total) into the muscle once as  needed for up to 1 dose (for severe allergic reaction). CAll 911 immediately if you have to use this medicine (Patient not taking: Reported on 11/21/2021)   [EXPIRED] ibuprofen (ADVIL) 600 MG tablet TAKE 1 TABLET (600 MG TOTAL) BY MOUTH EVERY 6 (SIX) HOURS AS NEEDED FOR MILD PAIN OR MODERATE PAIN. FOR USE AFTER SURGERY   PROAIR HFA 108 (90 Base) MCG/ACT inhaler Inhale 2 puffs into the lungs as needed for wheezing or shortness of breath. (Patient not taking: Reported on 11/21/2021)   [DISCONTINUED] DULoxetine (CYMBALTA) 30 MG capsule Take 1 capsule (30 mg) by mouth daily with a 60 mg capsule X 7 days, then decrease to 60 mg by mouth daily for 7 days, then decrease down to 30 mg daily for 7 days then stop. (Patient not taking: Reported on 10/23/2021)   [DISCONTINUED] DULoxetine (CYMBALTA) 60 MG capsule Take 2 capsules (120 mg total) by mouth daily. (Patient not taking: Reported on 10/23/2021)   [DISCONTINUED] EPINEPHrine 0.3 mg/0.3 mL IJ SOAJ injection Use as directed in case of anaphlylaxis   [DISCONTINUED] Investigational olanzapine/placebo 10 MG capsule URCC 16070 Blister Card 2 Take 1 capsule by mouth daily. Take in the morning on Days 2-4.   No facility-administered encounter medications on file as of 11/21/2021.     ONCOLOGIC FAMILY HISTORY:  Family History  Problem Relation Age of Onset   Hypertension Mother    Fibromyalgia Mother    Colon cancer Mother 67   Asthma Sister        x2   Yves Dill Parkinson White syndrome Maternal Aunt 43   Hypertension Maternal Grandmother    Hypertension Maternal Grandfather    Cancer - Lung Maternal Grandfather    Stroke Maternal Grandfather    Colon cancer Other        MGM's niece, dx unknown age   Colon cancer Other        MGM's nephew; dx unknown age   Breast cancer Neg Hx      GENETIC COUNSELING/TESTING: See above  SOCIAL HISTORY:  Social History   Socioeconomic History   Marital status: Married    Spouse name: Not on file   Number of  children: 3   Years of education: associates   Highest education level: Not on file  Occupational History   Occupation: nursing unit clerk    Employer: [employer]  Tobacco Use   Smoking status: Former    Types: Cigarettes    Quit date: 01/09/2012    Years since quitting: 9.8   Smokeless tobacco: Never   Tobacco comments:    Pt no longer smokes  Substance and Sexual Activity   Alcohol use: Not Currently    Alcohol/week: 0.0 standard drinks    Comment: very rare   Drug use: Never   Sexual activity: Not on file  Other Topics Concern   Not on file  Social History Narrative   epworth sleepiness scale =  10 (01/09/2016)   Social Determinants of Health   Financial Resource Strain: Low Risk    Difficulty of Paying Living Expenses: Not very hard  Food Insecurity: No Food Insecurity   Worried About Charity fundraiser in the Last Year: Never true   Ran Out of Food in the Last Year: Never true  Transportation Needs: No Transportation Needs   Lack of Transportation (Medical): No   Lack of Transportation (Non-Medical): No  Physical Activity: Inactive   Days of Exercise per Week: 0 days   Minutes of Exercise per Session: 0 min  Stress: No Stress Concern Present   Feeling of Stress : Only a little  Social Connections: Moderately Integrated   Frequency of Communication with Friends and Family: Three times a week   Frequency of Social Gatherings with Friends and Family: Twice a week   Attends Religious Services: 1 to 4 times per year   Active Member of [employer] or Organizations: No   Attends Music therapist: Never   Marital Status: Married  Human resources officer Violence: Not At Risk   Fear of Current or Ex-Partner: No   Emotionally Abused: No   Physically Abused: No   Sexually Abused: No     OBSERVATIONS/OBJECTIVE:   LABORATORY DATA:  None for this visit.  DIAGNOSTIC IMAGING:  None for this visit.      ASSESSMENT AND PLAN:  Ms.. Davis is a pleasant 46 y.o. female  with Stage IA left breast invasive ductal carcinoma, ER+/PR+/HER2-, diagnosed in 09/2020, treated with bilateral mastectomies, adjuvant chemotherapy, adjuvant radiation therapy, and anti-estrogen therapy with Tamoxifen beginning in 09/2021.  She presents to the Survivorship Clinic for our initial meeting and routine follow-up post-completion of treatment for breast cancer.    1. Stage IA left breast cancer:  Jennifer Davis is continuing to recover from definitive treatment for breast cancer. She will follow-up with her medical oncologist, Dr. Lindi Adie in 6 months with history and physical exam per surveillance protocol.  She will continue her anti-estrogen therapy with Tamoxifen.  She is experiencing hot flashes and arthralgias.  I gave her reassurance that these a lot of times will improve after being on Tamoxifen for about 5 months. We also reviewed how imaging is not indicated after mastectomies, and will depend on her reconstruction and her plastic surgeon will manage this.  Today, a comprehensive survivorship care plan and treatment summary was reviewed with the patient today detailing her breast cancer diagnosis, treatment course, potential late/long-term effects of treatment, appropriate follow-up care with recommendations for the future, and patient education resources.  A copy of this summary, along with a letter will be sent to the patients primary care provider via mail/fax/In Basket message after todays visit.    2. Morbid obesity: She has an upcoming appt with [facility] which is a great opportunity for her.  I gave her some reassurance that many women gain weight during chemotherapy and once starting tamoxifen and over a few months her body should start to adjust to the tamoxifen.  She is motivated to lose the weight which is very promising in her success.    3. Bone health:  She was given education on specific activities to promote bone health.  4. Cancer screening:  Due to Ms.  Davis history and her age, she should receive screening for skin cancers, colon cancer, and gynecologic cancers.  The information and recommendations are listed on the patient's comprehensive care plan/treatment summary and were reviewed in detail with  the patient.    5. Health maintenance and wellness promotion: Jennifer Davis was encouraged to consume 5-7 servings of fruits and vegetables per day. We reviewed the "Nutrition Rainbow" handout.  She was also encouraged to engage in moderate to vigorous exercise for 30 minutes per day most days of the week. We discussed the [employer] fitness program, which is designed for cancer survivors to help them become more physically fit after cancer treatments.  She was instructed to limit her alcohol consumption and continue to abstain from tobacco use.     6. Support services/counseling: It is not uncommon for this period of the patient's cancer care trajectory to be one of many emotions and stressors.  She was given information regarding our available services and encouraged to contact me with any questions or for help enrolling in any of our support group/programs.    Follow up instructions:    -Return to cancer center in  6 months for f/u with Dr. Lindi Adie -She is welcome to return back to the Survivorship Clinic at any time; no additional follow-up needed at this time.  -Consider referral back to survivorship as a long-term survivor for continued surveillance  The patient was provided an opportunity to ask questions and all were answered. The patient agreed with the plan and demonstrated an understanding of the instructions.   Total encounter time: 45 minutes inf ace to face visit itme, chart review, lab review, care coordination, and documentation of the encounter.   Wilber Bihari, NP 11/21/21 2:55 PM Medical Oncology and Hematology [facility] [place], [place] [address] Tel. [phone]    Fax. [phone]  *Total  Encounter Time as defined by the Centers for Medicare and Medicaid Services includes, in addition to the face-to-face time of a patient visit (documented in the note above) non-face-to-face time: obtaining and reviewing outside history, ordering and reviewing medications, tests or procedures, care coordination (communications with other health care professionals or caregivers) and documentation in the medical record.

## 2021-11-22 ENCOUNTER — Encounter: Payer: Self-pay | Admitting: Adult Health

## 2021-12-01 ENCOUNTER — Other Ambulatory Visit (HOSPITAL_COMMUNITY): Payer: Self-pay

## 2021-12-02 ENCOUNTER — Other Ambulatory Visit: Payer: Self-pay

## 2021-12-02 ENCOUNTER — Ambulatory Visit: Payer: 59 | Admitting: Plastic Surgery

## 2021-12-02 DIAGNOSIS — Z9013 Acquired absence of bilateral breasts and nipples: Secondary | ICD-10-CM

## 2021-12-02 NOTE — Progress Notes (Signed)
° °  Subjective:    Patient ID: Jennifer Davis, female    DOB: [birth year], 46 y.o.   MRN: [id number]  The patient is a 46 year old female here for follow-up on her breast reconstruction.  She was diagnosed with left breast cancer in December 2021.  Was DCIS, estrogen and progesterone positive.  She had a positive axillary lymph node.  She is 4 feet 11 inches tall and weighs approximately 220 pounds.  Her preoperative bra size was a 40D.  She opted for bilateral mastectomies with expander reconstruction.  Her surgery was done in January 2022.  She had a seroma and some issues with the expander which was replaced a month later.  She continued to have problems so the decision was made in May to remove it.  She had an additional excision of the right wound and since then has been doing much better.  She is completely healed and finished her radiation in October 2022.  She will be seeing the [facility] tomorrow and is really excited about getting started on her weight loss journey.    Review of Systems  Constitutional: Negative.   Eyes: Negative.   Respiratory: Negative.    Cardiovascular: Negative.   Gastrointestinal: Negative.   Endocrine: Negative.   Genitourinary: Negative.   Hematological: Negative.       Objective:   Physical Exam Cardiovascular:     Rate and Rhythm: Normal rate.     Pulses: Normal pulses.  Pulmonary:     Effort: Pulmonary effort is normal.  Musculoskeletal:        General: No swelling.  Skin:    General: Skin is warm.     Capillary Refill: Capillary refill takes less than 2 seconds.     Coloration: Skin is not jaundiced.  Neurological:     Mental Status: She is alert and oriented to person, place, and time.      Assessment & Plan:     ICD-10-CM   1. S/P mastectomy, bilateral  Z90.13       Pictures were obtained of the patient and placed in the chart with the patient's or guardian's permission. I will plan to see her in 6 months.  We will  then talk about next steps for reconstruction and what her options are for the right side since she does not have an expander in place right now.  It may be difficult to do expander only.  We will probably going to have to talk about a latissimus or TRAM flap.

## 2021-12-03 ENCOUNTER — Encounter (INDEPENDENT_AMBULATORY_CARE_PROVIDER_SITE_OTHER): Payer: Self-pay | Admitting: Family Medicine

## 2021-12-03 ENCOUNTER — Ambulatory Visit (INDEPENDENT_AMBULATORY_CARE_PROVIDER_SITE_OTHER): Payer: 59 | Admitting: Family Medicine

## 2021-12-03 VITALS — BP 129/78 | HR 73 | Temp 98.2°F | Ht 60.0 in | Wt 210.0 lb

## 2021-12-03 DIAGNOSIS — R0602 Shortness of breath: Secondary | ICD-10-CM | POA: Diagnosis not present

## 2021-12-03 DIAGNOSIS — J4540 Moderate persistent asthma, uncomplicated: Secondary | ICD-10-CM | POA: Diagnosis not present

## 2021-12-03 DIAGNOSIS — F32A Depression, unspecified: Secondary | ICD-10-CM

## 2021-12-03 DIAGNOSIS — C50412 Malignant neoplasm of upper-outer quadrant of left female breast: Secondary | ICD-10-CM | POA: Diagnosis not present

## 2021-12-03 DIAGNOSIS — R0683 Snoring: Secondary | ICD-10-CM

## 2021-12-03 DIAGNOSIS — E559 Vitamin D deficiency, unspecified: Secondary | ICD-10-CM

## 2021-12-03 DIAGNOSIS — E65 Localized adiposity: Secondary | ICD-10-CM

## 2021-12-03 DIAGNOSIS — J301 Allergic rhinitis due to pollen: Secondary | ICD-10-CM

## 2021-12-03 DIAGNOSIS — F419 Anxiety disorder, unspecified: Secondary | ICD-10-CM

## 2021-12-03 DIAGNOSIS — E668 Other obesity: Secondary | ICD-10-CM

## 2021-12-03 DIAGNOSIS — K219 Gastro-esophageal reflux disease without esophagitis: Secondary | ICD-10-CM

## 2021-12-03 DIAGNOSIS — R5383 Other fatigue: Secondary | ICD-10-CM | POA: Diagnosis not present

## 2021-12-03 DIAGNOSIS — Z170 Estrogen receptor positive status [ER+]: Secondary | ICD-10-CM

## 2021-12-03 DIAGNOSIS — R7303 Prediabetes: Secondary | ICD-10-CM

## 2021-12-03 DIAGNOSIS — M1611 Unilateral primary osteoarthritis, right hip: Secondary | ICD-10-CM

## 2021-12-03 DIAGNOSIS — R7401 Elevation of levels of liver transaminase levels: Secondary | ICD-10-CM

## 2021-12-03 DIAGNOSIS — Z6841 Body Mass Index (BMI) 40.0 and over, adult: Secondary | ICD-10-CM

## 2021-12-03 DIAGNOSIS — D649 Anemia, unspecified: Secondary | ICD-10-CM | POA: Diagnosis not present

## 2021-12-03 DIAGNOSIS — Z9013 Acquired absence of bilateral breasts and nipples: Secondary | ICD-10-CM

## 2021-12-04 LAB — COMPREHENSIVE METABOLIC PANEL
ALT: 14 IU/L (ref 0–32)
AST: 15 IU/L (ref 0–40)
Albumin/Globulin Ratio: 1.8 (ref 1.2–2.2)
Albumin: 4.2 g/dL (ref 3.8–4.8)
Alkaline Phosphatase: 74 IU/L (ref 44–121)
BUN/Creatinine Ratio: 15 (ref 9–23)
BUN: 11 mg/dL (ref 6–24)
Bilirubin Total: <0.2 mg/dL (ref 0.0–1.2)
CO2: 24 mmol/L (ref 20–29)
Calcium: 9.3 mg/dL (ref 8.7–10.2)
Chloride: 105 mmol/L (ref 96–106)
Creatinine, Ser: 0.74 mg/dL (ref 0.57–1.00)
Globulin, Total: 2.4 g/dL (ref 1.5–4.5)
Glucose: 95 mg/dL (ref 70–99)
Potassium: 4.7 mmol/L (ref 3.5–5.2)
Sodium: 144 mmol/L (ref 134–144)
Total Protein: 6.6 g/dL (ref 6.0–8.5)
eGFR: 102 mL/min/{1.73_m2} (ref >59)

## 2021-12-04 LAB — LIPID PANEL
Chol/HDL Ratio: 3.8 ratio (ref 0.0–4.4)
Cholesterol, Total: 217 mg/dL — ABNORMAL HIGH (ref 100–199)
HDL: 57 mg/dL (ref >39)
LDL Chol Calc (NIH): 132 mg/dL — ABNORMAL HIGH (ref 0–99)
Triglycerides: 159 mg/dL — ABNORMAL HIGH (ref 0–149)
VLDL Cholesterol Cal: 28 mg/dL (ref 5–40)

## 2021-12-04 LAB — ANEMIA PANEL
Ferritin: 32 ng/mL (ref 15–150)
Folate, Hemolysate: 408 ng/mL
Folate, RBC: 983 ng/mL (ref >498)
Hematocrit: 41.5 % (ref 34.0–46.6)
Iron Saturation: 15 % (ref 15–55)
Iron: 67 ug/dL (ref 27–159)
Retic Ct Pct: 1.6 % (ref 0.6–2.6)
Total Iron Binding Capacity: 441 ug/dL (ref 250–450)
UIBC: 374 ug/dL (ref 131–425)
Vitamin B-12: 534 pg/mL (ref 232–1245)

## 2021-12-04 LAB — CBC WITH DIFFERENTIAL/PLATELET
Basophils Absolute: 0 10*3/uL (ref 0.0–0.2)
Basos: 1 %
EOS (ABSOLUTE): 0.4 10*3/uL (ref 0.0–0.4)
Eos: 6 %
Hemoglobin: 12.9 g/dL (ref 11.1–15.9)
Immature Grans (Abs): 0 10*3/uL (ref 0.0–0.1)
Immature Granulocytes: 0 %
Lymphocytes Absolute: 1.3 10*3/uL (ref 0.7–3.1)
Lymphs: 19 %
MCH: 26.5 pg — ABNORMAL LOW (ref 26.6–33.0)
MCHC: 31.1 g/dL — ABNORMAL LOW (ref 31.5–35.7)
MCV: 85 fL (ref 79–97)
Monocytes Absolute: 0.4 10*3/uL (ref 0.1–0.9)
Monocytes: 5 %
Neutrophils Absolute: 4.8 10*3/uL (ref 1.4–7.0)
Neutrophils: 69 %
Platelets: 328 10*3/uL (ref 150–450)
RBC: 4.87 x10E6/uL (ref 3.77–5.28)
RDW: 16.5 % — ABNORMAL HIGH (ref 11.7–15.4)
WBC: 7 10*3/uL (ref 3.4–10.8)

## 2021-12-04 LAB — TSH: TSH: 0.88 u[IU]/mL (ref 0.450–4.500)

## 2021-12-04 LAB — HEMOGLOBIN A1C
Est. average glucose Bld gHb Est-mCnc: 140 mg/dL
Hgb A1c MFr Bld: 6.5 % — ABNORMAL HIGH (ref 4.8–5.6)

## 2021-12-04 LAB — INSULIN, RANDOM: INSULIN: 12.1 u[IU]/mL (ref 2.6–24.9)

## 2021-12-04 LAB — T4, FREE: Free T4: 1.03 ng/dL (ref 0.82–1.77)

## 2021-12-04 LAB — VITAMIN D 25 HYDROXY (VIT D DEFICIENCY, FRACTURES): Vit D, 25-Hydroxy: 31.5 ng/mL (ref 30.0–100.0)

## 2021-12-09 NOTE — Progress Notes (Signed)
Chief Complaint:   OBESITY Jennifer Davis (MR# [id number]) is a 46 y.o. female who presents for evaluation and treatment of obesity and related comorbidities. Current BMI is Body mass index is 41.01 kg/m. Atoya has been struggling with her weight for many years and has been unsuccessful in either losing weight, maintaining weight loss, or reaching her healthy weight goal.  Alexia is currently in the action stage of change and ready to dedicate time achieving and maintaining a healthier weight. Anaisabel is interested in becoming our patient and working on intensive lifestyle modifications including (but not limited to) diet and exercise for weight loss.  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  She lives with her husband, 3 children (youngest is 15) and her sister.  She gets dry mouth.  Dejah's habits were reviewed today and are as follows: Her family eats meals together, she thinks her family will eat healthier with her, her desired weight loss is 95 pounds, she started gaining weight in 2014, she craves coffee, salads, peanut butter and banana sandwiches, she wakes up sometimes in the middle of the night to eat, she is frequently drinking liquids with calories, she sometimes makes poor food choices, she has problems with excessive hunger, and she struggles with emotional eating.  Kilee provided the following food recall today:  Breakfast:  Eat in the car - breakfast sandwich.  1 cup of coffee at work, 2-3 cups at home. Lunch:  Chili (leftovers), spaghetti, salad. PB & banana sandwich, yogurt. Does not eat at night.  Depression Screen Jakerria's Food and Mood (modified PHQ-9) score was 7.  Depression screen PHQ 2/9 12/03/2021  Decreased Interest 1  Down, Depressed, Hopeless 1  PHQ - 2 Score 2  Altered sleeping 2  Tired, decreased energy 1  Change in appetite 1  Feeling bad or failure about yourself  0  Trouble concentrating 1  Moving slowly or fidgety/restless 0  Suicidal  thoughts 0  PHQ-9 Score 7  Difficult doing work/chores Not difficult at all  Some recent data might be hidden   Assessment/Plan:   1. Other fatigue Avary denies daytime somnolence and admits to waking up still tired. Patient has a history of symptoms of morning fatigue, morning headache, and snoring. Nocole generally gets  5-7  hours of sleep per night, and states that she has generally restful sleep. Snoring is present. Apneic episodes are present. Epworth Sleepiness Score is 6.   Carrolyn does not feel that her weight is causing her energy to be lower than it should be. Fatigue may be related to obesity, depression or many other causes. Labs will be ordered, and in the meanwhile, Shaney will focus on self care including making healthy food choices, increasing physical activity and focusing on stress reduction.  - EKG 12-Lead - Anemia panel - CBC with Differential/Platelet - Comprehensive metabolic panel - TSH - Thyroid Peroxidase Antibodies (TPO) (REFL) - T4, free  2. SOB (shortness of breath) on exertion Arlenis notes increasing shortness of breath with exercising and seems to be worsening over time with weight gain. She notes getting out of breath sooner with activity than she used to. This has gotten worse recently. Makyra denies shortness of breath at rest or orthopnea.  Safa does feel that she gets out of breath more easily that she used to when she exercises. Florinda's shortness of breath appears to be obesity related and exercise induced. She has agreed to work on weight loss and gradually increase exercise  to treat her exercise induced shortness of breath. Will continue to monitor closely.  - Lipid panel  3. Visceral obesity Current visceral fat rating: 13. Visceral fat rating goal is < 13. Visceral adipose tissue is a hormonally active component of total body fat. This body composition phenotype is associated with medical disorders such as metabolic syndrome, cardiovascular  disease, and several malignancies including prostate, breast, and colorectal cancers. Starting goal: Lose 7-10% of starting weight.   4. Moderate persistent asthma without complication Mailee is taking Zyrtec, Singulair, albuterol, and Symbicort for her asthma symptoms.  5. Seasonal allergic rhinitis due to pollen Willella is taking Zyrtec, Singulair, albuterol, and Symbicort for her asthma symptoms.  6. Osteoarthritis of right hip, unspecified osteoarthritis type Jenan says she has had stiffness ever since she had shingles.  7. Malignant neoplasm of upper-outer quadrant of left breast in female, estrogen receptor positive ([place]) She is taking tamoxifen 20 mg daily.  8. Prediabetes Goal is HgbA1c < 5.7.  Medication: None.  Prediabetes was before tamoxifen.    Plan: She will continue to focus on protein-rich, low simple carbohydrate foods. We reviewed the importance of hydration, regular exercise for stress reduction, and restorative sleep.   - Hemoglobin A1c - Insulin, random  9. Gastroesophageal reflux disease, unspecified whether esophagitis present She says it is not as bad as during her CA treatment.  She previously took Prevacid.  We reviewed the diagnosis of GERD and importance of treatment. We discussed "red flag" symptoms and the importance of follow up if symptoms persisted despite treatment. We reviewed non-pharmacologic management of GERD symptoms: including: caffeine reduction, dietary changes, elevate HOB, NPO after supper, reduction of alcohol intake, tobacco cessation, and weight loss.  10. Snores Oaklynn endorses snoring, morning headaches, and apneic episodes.  She had a sleep study a few years ago.  She says, "I think it was fine."  Sleep study at [facility].  11. Anemia, unspecified type She has not been checked since having chemo.  12. Elevated ALT measurement Elevated liver transaminases with an ALT predominance combined with obesity and insulin resistance is  characteristic, but not diagnostic of non-alcoholic fatty liver disease (NAFLD). NAFLD is the 2nd leading cause of liver transplant in adults. Treatment includes weight loss, elimination of sweet drinks, including juice, avoidance of high fructose corn syrup, and exercise. As always, avoiding alcohol consumption is important.  84. S/P mastectomy, bilateral In January 2022, with immediate reconstruction.  14. Vitamin D deficiency She is not currently taking a vitamin D supplement.   Plan: Will check vitamin D level today.  - VITAMIN D 25 Hydroxy (Vit-D Deficiency, Fractures)  15. Anxiety and depression, with emotional eating Not optimized. Medication: Lexapro 20 mg daily.   Plan: Discussed cues and consequences, how thoughts affect eating, model of thoughts, feelings, and behaviors, and strategies for change by focusing on the cue. Discussed cognitive distortions, coping thoughts, and how to change your thoughts.  16. Class 3 severe obesity with serious comorbidity and body mass index (BMI) of 40.0 to 44.9 in adult, unspecified obesity type [facility])  Idalys is currently in the action stage of change and her goal is to continue with weight loss efforts. I recommend Hazle begin the structured treatment plan as follows:  She has agreed to the Category 2 Plan.  Exercise goals: No exercise has been prescribed at this time.   Behavioral modification strategies: increasing lean protein intake, decreasing simple carbohydrates, increasing vegetables, and increasing water intake.  She was informed of the importance of  frequent follow-up visits to maximize her success with intensive lifestyle modifications for her multiple health conditions. She was informed we would discuss her lab results at her next visit unless there is a critical issue that needs to be addressed sooner. Yailin agreed to keep her next visit at the agreed upon time to discuss these results.  Objective:   Blood pressure 129/78,  pulse 73, temperature 98.2 F (36.8 C), temperature source Oral, height 5' (1.524 m), weight 210 lb (95.3 kg), last menstrual period 12/10/2020, SpO2 97 %. Body mass index is 41.01 kg/m.  EKG: Normal sinus rhythm, rate 90 bpm.  Indirect Calorimeter completed today shows a VO2 of 246 and a REE of 1699.  Her calculated basal metabolic rate is 4742 thus her basal metabolic rate is better than expected.  General: Cooperative, alert, well developed, in no acute distress. HEENT: Conjunctivae and lids unremarkable. Cardiovascular: Regular rhythm.  Lungs: Normal work of breathing. Neurologic: No focal deficits.   Attestation Statements:   This is the patient's first visit at [facility]. The patient's NEW PATIENT PACKET was reviewed at length. Included in the packet: current and past health history, medications, allergies, ROS, gynecologic history (women only), surgical history, family history, social history, weight history, weight loss surgery history (for those that have had weight loss surgery), nutritional evaluation, mood and food questionnaire, PHQ9, Epworth questionnaire, sleep habits questionnaire, patient life and health improvement goals questionnaire. These will all be scanned into the patient's chart under media.   During the visit, I independently reviewed the patient's EKG, bioimpedance scale results, and indirect calorimeter results. I used this information to tailor a meal plan for the patient that will help her to lose weight and will improve her obesity-related conditions going forward. I performed a medically necessary appropriate examination and/or evaluation. I discussed the assessment and treatment plan with the patient. The patient was provided an opportunity to ask questions and all were answered. The patient agreed with the plan and demonstrated an understanding of the instructions. Labs were ordered at this visit and will be reviewed at the next visit unless more  critical results need to be addressed immediately. Clinical information was updated and documented in the EMR.   I, Water quality scientist, CMA, am acting as transcriptionist for Briscoe Deutscher, DO  I have reviewed the above documentation for accuracy and completeness, and I agree with the above. - Briscoe Deutscher, DO, MS, FAAFP, DABOM - Family and Bariatric Medicine.

## 2021-12-16 ENCOUNTER — Other Ambulatory Visit (HOSPITAL_COMMUNITY): Payer: Self-pay

## 2021-12-20 ENCOUNTER — Other Ambulatory Visit (HOSPITAL_COMMUNITY): Payer: Self-pay

## 2021-12-22 ENCOUNTER — Other Ambulatory Visit (HOSPITAL_COMMUNITY): Payer: Self-pay

## 2021-12-22 MED ORDER — ALBUTEROL SULFATE HFA 108 (90 BASE) MCG/ACT IN AERS
2.0000 | INHALATION_SPRAY | RESPIRATORY_TRACT | 0 refills | Status: DC | PRN
  Filled 2021-12-22: qty 8.5, 17d supply, fill #0

## 2021-12-24 ENCOUNTER — Other Ambulatory Visit: Payer: Self-pay

## 2021-12-24 ENCOUNTER — Other Ambulatory Visit (HOSPITAL_COMMUNITY): Payer: Self-pay

## 2021-12-24 ENCOUNTER — Encounter (INDEPENDENT_AMBULATORY_CARE_PROVIDER_SITE_OTHER): Payer: Self-pay | Admitting: Family Medicine

## 2021-12-24 ENCOUNTER — Ambulatory Visit (INDEPENDENT_AMBULATORY_CARE_PROVIDER_SITE_OTHER): Payer: 59 | Admitting: Family Medicine

## 2021-12-24 VITALS — BP 124/77 | HR 74 | Temp 97.9°F | Ht 60.0 in | Wt 207.0 lb

## 2021-12-24 DIAGNOSIS — E782 Mixed hyperlipidemia: Secondary | ICD-10-CM

## 2021-12-24 DIAGNOSIS — E1169 Type 2 diabetes mellitus with other specified complication: Secondary | ICD-10-CM | POA: Diagnosis not present

## 2021-12-24 DIAGNOSIS — Z853 Personal history of malignant neoplasm of breast: Secondary | ICD-10-CM | POA: Diagnosis not present

## 2021-12-24 DIAGNOSIS — E669 Obesity, unspecified: Secondary | ICD-10-CM

## 2021-12-24 DIAGNOSIS — E66813 Obesity, class 3: Secondary | ICD-10-CM

## 2021-12-24 DIAGNOSIS — Z6841 Body Mass Index (BMI) 40.0 and over, adult: Secondary | ICD-10-CM | POA: Diagnosis not present

## 2021-12-24 MED ORDER — TIRZEPATIDE 2.5 MG/0.5ML ~~LOC~~ SOAJ
2.5000 mg | SUBCUTANEOUS | 3 refills | Status: DC
  Filled 2021-12-24 – 2022-01-05 (×3): qty 2, 28d supply, fill #0

## 2021-12-25 ENCOUNTER — Other Ambulatory Visit (HOSPITAL_COMMUNITY): Payer: Self-pay

## 2021-12-25 NOTE — Progress Notes (Signed)
Chief Complaint:   OBESITY Jennifer Davis is here to discuss her progress with her obesity treatment plan along with follow-up of her obesity related diagnoses. See Medical Weight Management Flowsheet for complete bioelectrical impedance results.  Today's visit was #: 2 Starting weight: 210 lbs Starting date: 12/03/2021 Weight change since last visit: 3 lbs Total lbs lost to date: 3 lbs Total weight loss percentage to date: -1.43%  Nutrition Plan: Category 2 Plan for 80% of the time.  Activity: Increased walking.  Interim History: Divinity says that she followed the plan well.  Substituted appropriately.  Focused on protein.  Very proud of herself!  She endorses polyphagia.  Assessment/Plan:   1. Type 2 diabetes mellitus with other specified complication, without long-term current use of insulin (HCC) Diabetes Mellitus: Not at goal. Medication: None.  Issues reviewed: blood sugar goals, complications of diabetes mellitus, hypoglycemia prevention and treatment, exercise, and nutrition.  Plan:  Start Mounjaro 2.5 mg subcutaneously weekly.  The patient will continue to focus on protein-rich, low simple carbohydrate foods. We reviewed the importance of hydration, regular exercise for stress reduction, and restorative sleep.   Lab Results  Component Value Date   HGBA1C 6.5 (H) 12/03/2021   Lab Results  Component Value Date   LDLCALC 132 (H) 12/03/2021   CREATININE 0.74 12/03/2021   - Start tirzepatide [facility]) 2.5 MG/0.5ML Pen; Inject 2.5 mg into the skin once a week.  Dispense: 2 mL; Refill: 3  2. Mixed hyperlipidemia due to type 2 diabetes mellitus (HCC) Course: Not at goal. Lipid-lowering medications: None.   Plan: Dietary changes: Increase soluble fiber, decrease simple carbohydrates, decrease saturated fat. Exercise changes: Moderate to vigorous-intensity aerobic activity 150 minutes per week or as tolerated. We will continue to monitor along with PCP/specialists as it pertains  to her weight loss journey.  Lab Results  Component Value Date   CHOL 217 (H) 12/03/2021   HDL 57 12/03/2021   LDLCALC 132 (H) 12/03/2021   TRIG 159 (H) 12/03/2021   CHOLHDL 3.8 12/03/2021   Lab Results  Component Value Date   ALT 14 12/03/2021   AST 15 12/03/2021   ALKPHOS 74 12/03/2021   BILITOT <0.2 12/03/2021   The 10-year ASCVD risk score (Arnett DK, et al., 2019) is: 1.5%   Values used to calculate the score:     Age: 46 years     Sex: Female     Is Non-Hispanic African American: No     Diabetic: Yes     Tobacco smoker: No     Systolic Blood Pressure: 062 mmHg     Is BP treated: No     HDL Cholesterol: 57 mg/dL     Total Cholesterol: 217 mg/dL  3. History of breast cancer, Rx Tamoxifen Followed by Oncology.  Will continue to monitor as it relates to her weight loss journey.  4. Obesity, current BMI 40.4  Course: Jennifer Davis is currently in the action stage of change. As such, her goal is to continue with weight loss efforts.   Nutrition goals: She has agreed to the Category 2 Plan.   Exercise goals:  As is.  Behavioral modification strategies: increasing lean protein intake, decreasing simple carbohydrates, and increasing vegetables.  Jennifer Davis has agreed to follow-up with our clinic in 4 weeks. She was informed of the importance of frequent follow-up visits to maximize her success with intensive lifestyle modifications for her multiple health conditions.   Objective:   Blood pressure 124/77, pulse 74, temperature 97.9 F (  36.6 C), temperature source Oral, height 5' (1.524 m), weight 207 lb (93.9 kg), SpO2 97 %. Body mass index is 40.43 kg/m.  General: Cooperative, alert, well developed, in no acute distress. HEENT: Conjunctivae and lids unremarkable. Cardiovascular: Regular rhythm.  Lungs: Normal work of breathing. Neurologic: No focal deficits.   Lab Results  Component Value Date   CREATININE 0.74 12/03/2021   BUN 11 12/03/2021   NA 144 12/03/2021   K 4.7  12/03/2021   CL 105 12/03/2021   CO2 24 12/03/2021   Lab Results  Component Value Date   ALT 14 12/03/2021   AST 15 12/03/2021   ALKPHOS 74 12/03/2021   BILITOT <0.2 12/03/2021   Lab Results  Component Value Date   HGBA1C 6.5 (H) 12/03/2021   Lab Results  Component Value Date   INSULIN 12.1 12/03/2021   Lab Results  Component Value Date   TSH 0.880 12/03/2021   Lab Results  Component Value Date   CHOL 217 (H) 12/03/2021   HDL 57 12/03/2021   LDLCALC 132 (H) 12/03/2021   TRIG 159 (H) 12/03/2021   CHOLHDL 3.8 12/03/2021   Lab Results  Component Value Date   VD25OH 31.5 12/03/2021   Lab Results  Component Value Date   WBC 7.0 12/03/2021   HGB 12.9 12/03/2021   HCT 41.5 12/03/2021   MCV 85 12/03/2021   PLT 328 12/03/2021   Lab Results  Component Value Date   IRON 67 12/03/2021   TIBC 441 12/03/2021   FERRITIN 32 12/03/2021   Attestation Statements:   Reviewed by clinician on day of visit: allergies, medications, problem list, medical history, surgical history, family history, social history, and previous encounter notes.  I, Water quality scientist, CMA, am acting as transcriptionist for Briscoe Deutscher, DO  I have reviewed the above documentation for accuracy and completeness, and I agree with the above. -  Briscoe Deutscher, DO, MS, FAAFP, DABOM - Family and Bariatric Medicine.

## 2021-12-29 ENCOUNTER — Encounter (INDEPENDENT_AMBULATORY_CARE_PROVIDER_SITE_OTHER): Payer: Self-pay

## 2021-12-30 ENCOUNTER — Other Ambulatory Visit (HOSPITAL_COMMUNITY): Payer: Self-pay

## 2021-12-30 ENCOUNTER — Telehealth (INDEPENDENT_AMBULATORY_CARE_PROVIDER_SITE_OTHER): Payer: Self-pay

## 2021-12-30 DIAGNOSIS — N898 Other specified noninflammatory disorders of vagina: Secondary | ICD-10-CM | POA: Diagnosis not present

## 2021-12-30 DIAGNOSIS — N750 Cyst of Bartholin's gland: Secondary | ICD-10-CM | POA: Diagnosis not present

## 2021-12-30 DIAGNOSIS — N949 Unspecified condition associated with female genital organs and menstrual cycle: Secondary | ICD-10-CM | POA: Diagnosis not present

## 2021-12-30 MED ORDER — FLUCONAZOLE 150 MG PO TABS
150.0000 mg | ORAL_TABLET | ORAL | 0 refills | Status: DC
  Filled 2021-12-30: qty 2, 3d supply, fill #0

## 2021-12-30 NOTE — Telephone Encounter (Incomplete)
Pt called in and stated that she would like to try the other medication that Dr. Juleen China. The pt stated that she has the discount

## 2021-12-31 ENCOUNTER — Other Ambulatory Visit (HOSPITAL_COMMUNITY): Payer: Self-pay

## 2022-01-02 ENCOUNTER — Other Ambulatory Visit: Payer: Self-pay | Admitting: Physician Assistant

## 2022-01-02 ENCOUNTER — Other Ambulatory Visit (HOSPITAL_COMMUNITY): Payer: Self-pay

## 2022-01-02 ENCOUNTER — Other Ambulatory Visit: Payer: Self-pay

## 2022-01-02 MED ORDER — BUDESONIDE-FORMOTEROL FUMARATE 160-4.5 MCG/ACT IN AERO
2.0000 | INHALATION_SPRAY | Freq: Two times a day (BID) | RESPIRATORY_TRACT | 5 refills | Status: DC
  Filled 2022-01-02: qty 10.2, 30d supply, fill #0
  Filled 2022-03-16: qty 10.2, 30d supply, fill #1
  Filled 2022-04-16: qty 10.2, 30d supply, fill #2
  Filled 2022-06-02: qty 10.2, 30d supply, fill #3
  Filled 2022-10-17: qty 10.2, 30d supply, fill #4
  Filled 2022-12-03: qty 10.2, 30d supply, fill #5

## 2022-01-02 MED ORDER — ESCITALOPRAM OXALATE 20 MG PO TABS
20.0000 mg | ORAL_TABLET | Freq: Every day | ORAL | 0 refills | Status: DC
  Filled 2022-01-02: qty 30, 30d supply, fill #0

## 2022-01-05 ENCOUNTER — Other Ambulatory Visit (HOSPITAL_COMMUNITY): Payer: Self-pay

## 2022-01-06 ENCOUNTER — Encounter: Payer: Self-pay | Admitting: Physician Assistant

## 2022-01-06 ENCOUNTER — Other Ambulatory Visit (HOSPITAL_COMMUNITY): Payer: Self-pay

## 2022-01-06 ENCOUNTER — Other Ambulatory Visit: Payer: Self-pay

## 2022-01-06 ENCOUNTER — Ambulatory Visit (INDEPENDENT_AMBULATORY_CARE_PROVIDER_SITE_OTHER): Payer: 59 | Admitting: Physician Assistant

## 2022-01-06 ENCOUNTER — Telehealth: Payer: Self-pay | Admitting: Physician Assistant

## 2022-01-06 DIAGNOSIS — F3342 Major depressive disorder, recurrent, in full remission: Secondary | ICD-10-CM

## 2022-01-06 DIAGNOSIS — F411 Generalized anxiety disorder: Secondary | ICD-10-CM

## 2022-01-06 MED ORDER — GABAPENTIN 100 MG PO CAPS
100.0000 mg | ORAL_CAPSULE | Freq: Three times a day (TID) | ORAL | 1 refills | Status: DC
  Filled 2022-01-06: qty 90, 30d supply, fill #0

## 2022-01-06 NOTE — Telephone Encounter (Signed)
Please let her know after she left the office I reviewed her labs more closely.  Her vitamin D is low normal and we would like for it to be at least 50 and hers is 31.  She needs to be started on vitamin D 50,000 IUs once a week.  Let me know which pharmacy to send that to and I will send it in.  Also tell her that I am doing some more research as recommended by Dr. Clovis Pu, concerning the tamoxifen interaction issue that we discussed today.  Let stick with our plan of the same Lexapro dose and adding the gabapentin and Ativan if needed.  If anything needs to change we will let her know or otherwise I will see how she is doing at our appointment next month.  Thank you.

## 2022-01-06 NOTE — Progress Notes (Signed)
[facility]  Patient ID: Jennifer Davis,  MRN: [id number]  PCP: Wenda Low, MD  Date of Evaluation: 01/06/2022 Time spent:40 minutes   Chief Complaint:  Chief Complaint   Depression; Follow-up; Anxiety; Insomnia    HISTORY/CURRENT STATUS: HPI For routine med check.   Cymbalta was changed to Lexapro last fall b/c of interaction w/ Tamoxifen. It was 'rocky' changing. She started having more pain all over, and has not felt well physically since the change was made.  It is difficult to know whether it was due to starting the tamoxifen or going off the Cymbalta, or starting the Lexapro.  The Lexapro has helped the depression and anxiety some but not as well as the Cymbalta.  The depression is not affecting her work.  Energy and motivation are fair to good depending on the day.  She does not cry easily.  ADLs and personal hygiene are within normal limits.  Not isolating.  No suicidal or homicidal thoughts.  Does have anxiety at times, it can come out of nowhere and she feels overwhelmed, kind of jittery inside, like something bad is going to happen.  No panic attacks but feels like she could get close.  She has left over Ativan after chemo, took for nausea but has not taken it because she has not needed it for that.  Has not taken it for anxiety, forgot it could be used for that.  Patient denies increased energy with decreased need for sleep, no increased talkativeness, no racing thoughts, no impulsivity or risky behaviors, no increased spending, no increased libido, no grandiosity, no increased irritability or anger, and no hallucinations.  Denies dizziness, syncope, seizures, numbness, tingling, tremor, tics, unsteady gait, slurred speech, confusion.  Since going off the Cymbalta she has had more generalized muscle achiness and fatigue.  No dystonia.   Individual Medical History/ Review of Systems: Changes? :Yes    She had radiation treatments for breast cancer since our last  visit.  She is getting good reports from oncology.  Past medications for mental health diagnoses include: Lexapro, Prozac wasn't effective, Xanax, Buspar worked but d/c when didn't need it.  Allergies: Bee venom and Other  Current Medications:  Current Outpatient Medications:    albuterol (VENTOLIN HFA) 108 (90 Base) MCG/ACT inhaler, Inhale 2 puffs into the lungs every 4-6 hours as needed., Disp: 8.5 g, Rfl: 0   budesonide-formoterol (SYMBICORT) 160-4.5 MCG/ACT inhaler, Inhale 2 puffs into the lungs 2 (two) times daily., Disp: 10.2 g, Rfl: 5   cetirizine (ZYRTEC) 10 MG tablet, Take 10 mg by mouth daily., Disp: , Rfl:    EPINEPHrine (EPIPEN 2-PAK) 0.3 mg/0.3 mL IJ SOAJ injection, Inject 0.3 mLs (0.3 mg total) into the muscle once as needed for up to 1 dose (for severe allergic reaction). CAll 911 immediately if you have to use this medicine, Disp: 1 each, Rfl: 1   escitalopram (LEXAPRO) 20 MG tablet, Take 1 tablet (20 mg total) by mouth daily., Disp: 30 tablet, Rfl: 0   gabapentin (NEURONTIN) 100 MG capsule, Take 1 capsule (100 mg total) by mouth 3 (three) times daily., Disp: 90 capsule, Rfl: 1   LORazepam (ATIVAN) 1 MG tablet, Take 0.5-1 mg by mouth every 8 (eight) hours as needed for anxiety., Disp: , Rfl:    MELATONIN PO, Take 5 mg by mouth as needed., Disp: , Rfl:    montelukast (SINGULAIR) 10 MG tablet, Take 10 mg by mouth at bedtime., Disp: , Rfl: 5   tamoxifen (NOLVADEX) 20  MG tablet, Take 1 tablet (20 mg total) by mouth daily., Disp: 90 tablet, Rfl: 3   budesonide-formoterol (SYMBICORT) 160-4.5 MCG/ACT inhaler, INHALE 2 PUFFS BY MOUTH 2 TIMES A DAY, Disp: 10.2 g, Rfl: 5   fluconazole (DIFLUCAN) 150 MG tablet, Take 1 tablet (150 mg total) by mouth today, then take 2nd tablet in 3 days (Patient not taking: Reported on 01/06/2022), Disp: 2 tablet, Rfl: 0   tirzepatide (MOUNJARO) 2.5 MG/0.5ML Pen, Inject 2.5 mg into the skin once a week. (Patient not taking: Reported on 01/06/2022), Disp: 2 mL,  Rfl: 3 Medication Side Effects: none  Family Medical/ Social History: Changes?  No  MENTAL HEALTH EXAM:  There were no vitals taken for this visit.There is no height or weight on file to calculate BMI.  General Appearance: Casual, Well Groomed, and Obese  Eye Contact:  Good  Speech:  Clear and Coherent and Normal Rate  Volume:  Normal  Mood:  Euthymic  Affect:  Congruent  Thought Process:  Goal Directed and Descriptions of Associations: Circumstantial  Orientation:  Full (Time, Place, and Person)  Thought Content: Logical   Suicidal Thoughts:  No  Homicidal Thoughts:  No  Memory:  WNL  Judgement:  Good  Insight:  Good  Psychomotor Activity:  Normal  Concentration:  Concentration: Good and Attention Span: Good  Recall:  Good  Fund of Knowledge: Good  Language: Good  Assets:  Desire for Improvement  ADL's:  Intact  Cognition: WNL  Prognosis:  Good   12/03/2021 Labs CBC with differential unremarkable CMP all within normal limits Lipid panel, total cholesterol 217, triglycerides 159, HDL 57, LDL 132. Hemoglobin A1c 6.5. TSH 0.88 Vitamin D 31.5.  DIAGNOSES:    ICD-10-CM   1. Recurrent major depressive disorder, in full remission (Ages)  F33.42     2. Generalized anxiety disorder  F41.1        Receiving Psychotherapy: No    RECOMMENDATIONS:  PDMP reviewed.  Valium filled 01/06/2021. I provided 40 minutes of face to face time during this encounter, including time spent before and after the visit in records review, medical decision making, counseling pertinent to today's visit, and charting.  We discussed her symptoms.  Because Cymbalta is a CYP2D6 inhibitor, it can interact with the tamoxifen.  There are options including increasing the Lexapro but that may cause more hot flashes that she is already getting from the tamoxifen.  Plus it won't help the diffuse pain that she has.  We can leave the Lexapro dose the same and add gabapentin since she is also having anxiety  with the achiness, which may be beneficial for both problems.  Another thought is the possibility of changing to another SNRI that is likely to help the pain more, I have will research that to make sure it is not a class effect that is contraindicated with the tamoxifen, and will discuss with my supervising physician Dr. Lynder Parents before I would change. For now we decided to leave the Lexapro dose the same and add gabapentin.  She does have Ativan from her oncologist so we will use that if needed.  She is comfortable with this plan.  Continue Lexapro 20 mg, 1 p.o. daily. Start gabapentin 100 mg, 1 p.o. 3 times daily. Start Ativan 1 mg, 1/2-1 3 times daily as needed anxiety. Return in 4 weeks.  I reviewed her labs from last month after she had left to the office.  Her vitamin D is low, needs to be at least  50 for mental health reasons.  I will ask one of our CMA's to call her about this so we can get her started on 50,000 IUs weekly.  I will send that prescription in once she has been notified.  Donnal Moat, PA-C

## 2022-01-07 ENCOUNTER — Other Ambulatory Visit: Payer: Self-pay | Admitting: Physician Assistant

## 2022-01-07 ENCOUNTER — Other Ambulatory Visit (HOSPITAL_COMMUNITY): Payer: Self-pay

## 2022-01-07 MED ORDER — VITAMIN D (ERGOCALCIFEROL) 1.25 MG (50000 UNIT) PO CAPS
50000.0000 [IU] | ORAL_CAPSULE | ORAL | 0 refills | Status: DC
  Filled 2022-01-07: qty 12, 84d supply, fill #0

## 2022-01-07 NOTE — Telephone Encounter (Signed)
Notified patient of results/recommendations. Please send Rx to [facility] on [facility].  ?

## 2022-01-07 NOTE — Telephone Encounter (Signed)
Prescription for vitamin D 50,000 IUs weekly was sent. ?

## 2022-01-20 DIAGNOSIS — N898 Other specified noninflammatory disorders of vagina: Secondary | ICD-10-CM | POA: Diagnosis not present

## 2022-01-22 ENCOUNTER — Ambulatory Visit (INDEPENDENT_AMBULATORY_CARE_PROVIDER_SITE_OTHER): Payer: 59 | Admitting: Family Medicine

## 2022-01-22 ENCOUNTER — Encounter (INDEPENDENT_AMBULATORY_CARE_PROVIDER_SITE_OTHER): Payer: Self-pay | Admitting: Family Medicine

## 2022-01-22 ENCOUNTER — Other Ambulatory Visit (HOSPITAL_COMMUNITY): Payer: Self-pay

## 2022-01-22 ENCOUNTER — Other Ambulatory Visit: Payer: Self-pay

## 2022-01-22 VITALS — BP 137/83 | HR 94 | Temp 98.0°F | Ht 60.0 in | Wt 204.0 lb

## 2022-01-22 DIAGNOSIS — E1169 Type 2 diabetes mellitus with other specified complication: Secondary | ICD-10-CM | POA: Diagnosis not present

## 2022-01-22 DIAGNOSIS — Z6839 Body mass index (BMI) 39.0-39.9, adult: Secondary | ICD-10-CM | POA: Diagnosis not present

## 2022-01-22 DIAGNOSIS — E782 Mixed hyperlipidemia: Secondary | ICD-10-CM

## 2022-01-22 DIAGNOSIS — Z853 Personal history of malignant neoplasm of breast: Secondary | ICD-10-CM

## 2022-01-22 DIAGNOSIS — E669 Obesity, unspecified: Secondary | ICD-10-CM | POA: Diagnosis not present

## 2022-01-22 MED ORDER — OZEMPIC (0.25 OR 0.5 MG/DOSE) 2 MG/1.5ML ~~LOC~~ SOPN
0.2500 mg | PEN_INJECTOR | SUBCUTANEOUS | 0 refills | Status: DC
  Filled 2022-01-22: qty 1.5, 56d supply, fill #0

## 2022-01-26 ENCOUNTER — Other Ambulatory Visit (HOSPITAL_COMMUNITY): Payer: Self-pay

## 2022-01-26 MED ORDER — OZEMPIC (0.25 OR 0.5 MG/DOSE) 2 MG/1.5ML ~~LOC~~ SOPN
0.2500 mg | PEN_INJECTOR | SUBCUTANEOUS | 0 refills | Status: DC
  Filled 2022-01-26: qty 1.5, 56d supply, fill #0

## 2022-02-02 ENCOUNTER — Other Ambulatory Visit: Payer: Self-pay | Admitting: Physician Assistant

## 2022-02-02 ENCOUNTER — Other Ambulatory Visit (HOSPITAL_COMMUNITY): Payer: Self-pay

## 2022-02-02 NOTE — Progress Notes (Signed)
Chief Complaint:   OBESITY Jennifer Davis is here to discuss her progress with her obesity treatment plan along with follow-up of her obesity related diagnoses. See Medical Weight Management Flowsheet for complete bioelectrical impedance results.  Today's visit was #: 3 Starting weight: 210 lbs Starting date: 12/03/2021 Weight change since last visit: 3 lbs Total lbs lost to date: 6 lbs Total weight loss percentage to date: -17.69%  Nutrition Plan: Category 2 Plan for 90% of the time. Activity: Not exercising regularly  Interim History: [name] was unable to obtain [facility] because it was not approved by her insurance.  She says she is proud of her nutrition changes.  Assessment/Plan:   1. Type 2 diabetes mellitus with other specified complication, without long-term current use of insulin (HCC) Diabetes Mellitus: Not at goal. Medication: None. Issues reviewed: blood sugar goals, complications of diabetes mellitus, hypoglycemia prevention and treatment, exercise, and nutrition.  Plan: Start Ozempic 0.25 mg subcutaneously weekly. The patient will continue to focus on protein-rich, low simple carbohydrate foods. We reviewed the importance of hydration, regular exercise for stress reduction, and restorative sleep.   Lab Results  Component Value Date   HGBA1C 6.5 (H) 12/03/2021   Lab Results  Component Value Date   LDLCALC 132 (H) 12/03/2021   CREATININE 0.74 12/03/2021    - Start Semaglutide,0.25 or 0.'5MG'$ /DOS, (OZEMPIC, 0.25 OR 0.5 MG/DOSE,) 2 MG/1.5ML SOPN; Inject 0.25 mg into the skin once a week.  Dispense: 1.5 mL; Refill: 0  2. Mixed hyperlipidemia due to type 2 diabetes mellitus (HCC) Course: Not at goal. Lipid-lowering medications: None.   Plan: Dietary changes: Increase soluble fiber, decrease simple carbohydrates, decrease saturated fat. Exercise changes: Moderate to vigorous-intensity aerobic activity 150 minutes per week or as tolerated. We will continue to monitor along with  PCP/specialists as it pertains to her weight loss journey.  Lab Results  Component Value Date   CHOL 217 (H) 12/03/2021   HDL 57 12/03/2021   LDLCALC 132 (H) 12/03/2021   TRIG 159 (H) 12/03/2021   CHOLHDL 3.8 12/03/2021   Lab Results  Component Value Date   ALT 14 12/03/2021   AST 15 12/03/2021   ALKPHOS 74 12/03/2021   BILITOT <0.2 12/03/2021   The 10-year ASCVD risk score (Arnett DK, et al., 2019) is: 1.8%   Values used to calculate the score:     Age: 46 years     Sex: Female     Is Non-Hispanic African American: No     Diabetic: Yes     Tobacco smoker: No     Systolic Blood Pressure: 818 mmHg     Is BP treated: No     HDL Cholesterol: 57 mg/dL     Total Cholesterol: 217 mg/dL  3. History of breast cancer, Rx Tamoxifen [name] was unable to obtain [facility] because it was not approved by her insurance.  She says she is proud of her nutrition changes.  4. Obesity, current BMI 39.9  Course: [name] is currently in the action stage of change. As such, her goal is to continue with weight loss efforts.   Nutrition goals: She has agreed to the Category 2 Plan.   Exercise goals:  As is.  Increase NEAT.  Behavioral modification strategies: increasing lean protein intake, decreasing simple carbohydrates, and increasing vegetables.  [name] has agreed to follow-up with our clinic in 4 weeks. She was informed of the importance of frequent follow-up visits to maximize her success with intensive lifestyle modifications for her multiple  health conditions.   Objective:   Blood pressure 137/83, pulse 94, temperature 98 F (36.7 C), height 5' (1.524 m), weight 204 lb (92.5 kg), SpO2 97 %. Body mass index is 39.84 kg/m.  General: Cooperative, alert, well developed, in no acute distress. HEENT: Conjunctivae and lids unremarkable. Cardiovascular: Regular rhythm.  Lungs: Normal work of breathing. Neurologic: No focal deficits.   Lab Results  Component Value Date   CREATININE 0.74  12/03/2021   BUN 11 12/03/2021   NA 144 12/03/2021   K 4.7 12/03/2021   CL 105 12/03/2021   CO2 24 12/03/2021   Lab Results  Component Value Date   ALT 14 12/03/2021   AST 15 12/03/2021   ALKPHOS 74 12/03/2021   BILITOT <0.2 12/03/2021   Lab Results  Component Value Date   HGBA1C 6.5 (H) 12/03/2021   Lab Results  Component Value Date   INSULIN 12.1 12/03/2021   Lab Results  Component Value Date   TSH 0.880 12/03/2021   Lab Results  Component Value Date   CHOL 217 (H) 12/03/2021   HDL 57 12/03/2021   LDLCALC 132 (H) 12/03/2021   TRIG 159 (H) 12/03/2021   CHOLHDL 3.8 12/03/2021   Lab Results  Component Value Date   VD25OH 31.5 12/03/2021   Lab Results  Component Value Date   WBC 7.0 12/03/2021   HGB 12.9 12/03/2021   HCT 41.5 12/03/2021   MCV 85 12/03/2021   PLT 328 12/03/2021   Lab Results  Component Value Date   IRON 67 12/03/2021   TIBC 441 12/03/2021   FERRITIN 32 12/03/2021   Attestation Statements:   Reviewed by clinician on day of visit: allergies, medications, problem list, medical history, surgical history, family history, social history, and previous encounter notes.  I, Water quality scientist, CMA, am acting as transcriptionist for Briscoe Deutscher, DO  I have reviewed the above documentation for accuracy and completeness, and I agree with the above. -  Briscoe Deutscher, DO, MS, FAAFP, DABOM - Family and Bariatric Medicine.

## 2022-02-03 ENCOUNTER — Other Ambulatory Visit (HOSPITAL_COMMUNITY): Payer: Self-pay

## 2022-02-03 NOTE — Telephone Encounter (Signed)
Has an appt with TH tomorrow.  ?

## 2022-02-04 ENCOUNTER — Ambulatory Visit (INDEPENDENT_AMBULATORY_CARE_PROVIDER_SITE_OTHER): Payer: 59 | Admitting: Physician Assistant

## 2022-02-04 ENCOUNTER — Other Ambulatory Visit (HOSPITAL_COMMUNITY): Payer: Self-pay

## 2022-02-04 ENCOUNTER — Encounter: Payer: Self-pay | Admitting: Physician Assistant

## 2022-02-04 ENCOUNTER — Other Ambulatory Visit: Payer: Self-pay

## 2022-02-04 DIAGNOSIS — Z170 Estrogen receptor positive status [ER+]: Secondary | ICD-10-CM

## 2022-02-04 DIAGNOSIS — F411 Generalized anxiety disorder: Secondary | ICD-10-CM

## 2022-02-04 DIAGNOSIS — F32A Depression, unspecified: Secondary | ICD-10-CM | POA: Diagnosis not present

## 2022-02-04 DIAGNOSIS — E559 Vitamin D deficiency, unspecified: Secondary | ICD-10-CM

## 2022-02-04 DIAGNOSIS — C50412 Malignant neoplasm of upper-outer quadrant of left female breast: Secondary | ICD-10-CM

## 2022-02-04 MED ORDER — ESCITALOPRAM OXALATE 20 MG PO TABS
20.0000 mg | ORAL_TABLET | Freq: Every day | ORAL | 0 refills | Status: DC
  Filled 2022-02-04: qty 90, 90d supply, fill #0

## 2022-02-04 MED ORDER — GABAPENTIN 100 MG PO CAPS
100.0000 mg | ORAL_CAPSULE | Freq: Three times a day (TID) | ORAL | 0 refills | Status: DC
  Filled 2022-02-04: qty 270, 90d supply, fill #0

## 2022-02-04 NOTE — Progress Notes (Signed)
[facility] ? ?Patient ID: Jennifer Davis,  ?MRN: [id number] ? ?PCP: Wenda Low, MD ? ?Date of Evaluation: 02/04/2022 ?Time spent:40 minutes  ? ?Chief Complaint:  ?Chief Complaint   ?Depression; Insomnia; Follow-up ?  ? ? ?HISTORY/CURRENT STATUS: ?HPI For routine med check.  ? ?Started new part-time job at [employer]. Difficult  getting OT at the hosp. Theyre building new house, so her discount at [employer] will be good for that reason as well. ? ?Feels stable on the Lexapro. Has situational anxiety and sadness but feels like it helps. Maybe not as good as Cymbalta tho. Changed b/c tamoxifen.. Patient denies loss of interest in usual activities and is able to enjoy things.  Denies decreased energy or motivation.  Appetite has not changed.  No extreme sadness, tearfulness, or feelings of hopelessness.  Denies any changes in concentration, making decisions or remembering things.  Sleeps ok. Denies suicidal or homicidal thoughts. ? ?Patient denies increased energy with decreased need for sleep, no increased talkativeness, no racing thoughts, no impulsivity or risky behaviors, no increased spending, no increased libido, no grandiosity, no increased irritability or anger, and no hallucinations. ? ?Denies dizziness, syncope, seizures, numbness, tingling, tremor, tics, unsteady gait, slurred speech, confusion.  Since going off the Cymbalta she has had more generalized muscle achiness, but a little better.   No dystonia.  ? ?Individual Medical History/ Review of Systems: Changes? :No    ? ? ?Past medications for mental health diagnoses include: ?Lexapro, Prozac wasn't effective, Xanax, Buspar worked but d/c when didn't need it. ? ?Allergies: Bee venom and Other ? ?Current Medications:  ?Current Outpatient Medications:  ?  albuterol (VENTOLIN HFA) 108 (90 Base) MCG/ACT inhaler, Inhale 2 puffs into the lungs every 4-6 hours as needed., Disp: 8.5 g, Rfl: 0 ?  budesonide-formoterol (SYMBICORT) 160-4.5 MCG/ACT inhaler,  Inhale 2 puffs into the lungs 2 (two) times daily., Disp: 10.2 g, Rfl: 5 ?  cetirizine (ZYRTEC) 10 MG tablet, Take 10 mg by mouth daily., Disp: , Rfl:  ?  EPINEPHrine (EPIPEN 2-PAK) 0.3 mg/0.3 mL IJ SOAJ injection, Inject 0.3 mLs (0.3 mg total) into the muscle once as needed for up to 1 dose (for severe allergic reaction). CAll 911 immediately if you have to use this medicine, Disp: 1 each, Rfl: 1 ?  LORazepam (ATIVAN) 1 MG tablet, Take 0.5-1 mg by mouth every 8 (eight) hours as needed for anxiety., Disp: , Rfl:  ?  MELATONIN PO, Take 5 mg by mouth as needed., Disp: , Rfl:  ?  montelukast (SINGULAIR) 10 MG tablet, Take 10 mg by mouth at bedtime., Disp: , Rfl: 5 ?  tamoxifen (NOLVADEX) 20 MG tablet, Take 1 tablet (20 mg total) by mouth daily., Disp: 90 tablet, Rfl: 3 ?  Vitamin D, Ergocalciferol, (DRISDOL) 1.25 MG (50000 UNIT) CAPS capsule, Take 1 capsule (50,000 Units total) by mouth every 7 (seven) days., Disp: 12 capsule, Rfl: 0 ?  escitalopram (LEXAPRO) 20 MG tablet, Take 1 tablet (20 mg total) by mouth daily., Disp: 90 tablet, Rfl: 0 ?  gabapentin (NEURONTIN) 100 MG capsule, Take 1 capsule (100 mg total) by mouth 3 (three) times daily., Disp: 270 capsule, Rfl: 0 ?  Semaglutide,0.25 or 0.'5MG'$ /DOS, (OZEMPIC, 0.25 OR 0.5 MG/DOSE,) 2 MG/1.5ML SOPN, Inject 0.25 mg into the skin once a week. (Patient not taking: Reported on 02/04/2022), Disp: 1.5 mL, Rfl: 0 ?Medication Side Effects: none ? ?Family Medical/ Social History: Changes?  Started a 2nd job, part-time at [employer].  ?Her  Mom has stage IV colon CA. Moved back to [place]. Very emotional and stressed with that.  ? ?MENTAL HEALTH EXAM: ? ?There were no vitals taken for this visit.There is no height or weight on file to calculate BMI.  ?General Appearance: Casual, Well Groomed, and Obese  ?Eye Contact:  Good  ?Speech:  Clear and Coherent and Normal Rate  ?Volume:  Normal  ?Mood:  Euthymic  ?Affect:  Congruent  ?Thought Process:  Goal Directed and  Descriptions of Associations: Circumstantial  ?Orientation:  Full (Time, Place, and Person)  ?Thought Content: Logical   ?Suicidal Thoughts:  No  ?Homicidal Thoughts:  No  ?Memory:  WNL  ?Judgement:  Good  ?Insight:  Good  ?Psychomotor Activity:  Normal  ?Concentration:  Concentration: Good and Attention Span: Good  ?Recall:  Good  ?Fund of Knowledge: Good  ?Language: Good  ?Assets:  Desire for Improvement  ?ADL's:  Intact  ?Cognition: WNL  ?Prognosis:  Good  ? ?12/03/2021 ?Labs ?CBC with differential unremarkable ?CMP all within normal limits ?Lipid panel, total cholesterol 217, triglycerides 159, HDL 57, LDL 132. ?Hemoglobin A1c 6.5. ?TSH 0.88 ?Vitamin D 31.5. ? ?DIAGNOSES:  ?  ICD-10-CM   ?1. Depression, unspecified depression type  F32.A   ?  ?2. Hypovitaminosis D  E55.9 VITAMIN D 25 Hydroxy (Vit-D Deficiency, Fractures)  ?  ?3. Generalized anxiety disorder  F41.1   ?  ?4. Malignant neoplasm of upper-outer quadrant of left breast in female, estrogen receptor positive ([place])  C50.412   ? Z17.0   ?  ? ? ?Receiving Psychotherapy: No  ? ? ?RECOMMENDATIONS:  ?PDMP reviewed.  [facility] filled 01/06/2021. ?I provided 40 minutes of face to face time during this encounter, including time spent before and after the visit in records review, medical decision making, counseling pertinent to today's visit, and charting.  ?We discussed her vitamin D level.  She will continue the weekly dose, repeat labs and then decide if she needs that or OTC dosing will be enough. ?She does seem to be doing better on the Lexapro.  The Cymbalta did help her more however.  I did discuss the situation with Dr. Clovis Pu after her last visit.  Newer research shows that the combination of tamoxifen plus Cymbalta is not as much of a problem as once thought.  Because she is doing better on the Lexapro, we agreed to not change back to the Cymbalta but we can if needed, in that situation I will send that paper to her oncologist and the pharmacist.   ?[email or web address].[email or web address] ? ?Continue Lexapro 20 mg, 1 p.o. daily. ?Continue gabapentin 100 mg, 1 p.o. 3 times daily. ?Continue Ativan 1 mg, 1/2-1 3 times daily as needed anxiety. (She rarely takes.) ?Continue vitamin D 50,000 IUs once a week. ?Lab to be drawn in 3-4 weeks.  ?Return in 3 months.  ? ?Donnal Moat, PA-C  ?

## 2022-02-05 ENCOUNTER — Encounter (INDEPENDENT_AMBULATORY_CARE_PROVIDER_SITE_OTHER): Payer: Self-pay | Admitting: Family Medicine

## 2022-02-11 ENCOUNTER — Ambulatory Visit (INDEPENDENT_AMBULATORY_CARE_PROVIDER_SITE_OTHER): Payer: 59 | Admitting: Family Medicine

## 2022-02-11 ENCOUNTER — Other Ambulatory Visit (HOSPITAL_COMMUNITY): Payer: Self-pay

## 2022-02-11 ENCOUNTER — Encounter (INDEPENDENT_AMBULATORY_CARE_PROVIDER_SITE_OTHER): Payer: Self-pay | Admitting: Family Medicine

## 2022-02-11 VITALS — BP 125/83 | HR 76 | Temp 97.9°F | Ht 60.0 in | Wt 201.0 lb

## 2022-02-11 DIAGNOSIS — E669 Obesity, unspecified: Secondary | ICD-10-CM

## 2022-02-11 DIAGNOSIS — R632 Polyphagia: Secondary | ICD-10-CM | POA: Diagnosis not present

## 2022-02-11 DIAGNOSIS — E65 Localized adiposity: Secondary | ICD-10-CM | POA: Diagnosis not present

## 2022-02-11 DIAGNOSIS — Z9013 Acquired absence of bilateral breasts and nipples: Secondary | ICD-10-CM | POA: Diagnosis not present

## 2022-02-11 DIAGNOSIS — Z6839 Body mass index (BMI) 39.0-39.9, adult: Secondary | ICD-10-CM | POA: Diagnosis not present

## 2022-02-11 MED ORDER — WEGOVY 0.25 MG/0.5ML ~~LOC~~ SOAJ
0.2500 mg | SUBCUTANEOUS | 1 refills | Status: DC
  Filled 2022-02-11 – 2022-02-19 (×2): qty 2, 28d supply, fill #0
  Filled 2022-03-11: qty 2, 28d supply, fill #1

## 2022-02-11 MED ORDER — WEGOVY 0.5 MG/0.5ML ~~LOC~~ SOAJ
0.5000 mg | SUBCUTANEOUS | 1 refills | Status: DC
  Filled 2022-02-11 – 2022-03-24 (×2): qty 2, 28d supply, fill #0

## 2022-02-11 NOTE — Progress Notes (Signed)
Chief Complaint:   OBESITY Jennifer Davis is here to discuss her progress with her obesity treatment plan along with follow-up of her obesity related diagnoses. See Medical Weight Management Flowsheet for complete bioelectrical impedance results.  Today's visit was #: 4 Starting weight: 210 lbs Starting date: 12/03/2021 Weight change since last visit: 3 lbs Total lbs lost to date: 9 lbs Total weight loss percentage to date: -4.29%  Nutrition Plan: Category 2 Plan for 75% of the time.  Activity: Increased walking. Anti-obesity medications: Ozempic 0.25 mg subcutaneously weekly. Reported side effects: None.  Interim History: Zareena says she is doing more walking.  She says that in general, she is making great food choices.  Assessment/Plan:   1. Polyphagia Improving, but not optimized. Current treatment: None.  Plan: Wegovy 0.25 mg subcutaneously weekly.  Refill provided today, as per below.  She will continue to focus on protein-rich, low simple carbohydrate foods. We reviewed the importance of hydration, regular exercise for stress reduction, and restorative sleep.  - Semaglutide-Weight Management (WEGOVY) 0.25 MG/0.5ML SOAJ; Inject 0.25 mg into the skin once a week.  Dispense: 2 mL; Refill: 1 - Increase Semaglutide-Weight Management (WEGOVY) 0.5 MG/0.5ML SOAJ; Inject 0.5 mg into the skin once a week.  Dispense: 2 mL; Refill: 1  2. Visceral obesity Current visceral fat rating: 13. Visceral fat rating goal is < 13. Visceral adipose tissue is a hormonally active component of total body fat. This body composition phenotype is associated with medical disorders such as metabolic syndrome, cardiovascular disease, and several malignancies including prostate, breast, and colorectal cancers. Starting goal: Lose 7-10% of starting weight.   3. S/P mastectomy, bilateral Followed by Oncology.  Will continue to monitor as it relates to her weight loss journey.  4. Obesity, current BMI  39.4  Course: Kabao is currently in the action stage of change. As such, her goal is to continue with weight loss efforts.   Nutrition goals: She has agreed to the Category 2 Plan.   Exercise goals:  As is.  Behavioral modification strategies: increasing lean protein intake, decreasing simple carbohydrates, and increasing vegetables.  Kyerra has agreed to follow-up with our clinic in 6 weeks. She was informed of the importance of frequent follow-up visits to maximize her success with intensive lifestyle modifications for her multiple health conditions.   Objective:   Blood pressure 125/83, pulse 76, temperature 97.9 F (36.6 C), temperature source Oral, height 5' (1.524 m), weight 201 lb (91.2 kg), SpO2 96 %. Body mass index is 39.26 kg/m.  General: Cooperative, alert, well developed, in no acute distress. HEENT: Conjunctivae and lids unremarkable. Cardiovascular: Regular rhythm.  Lungs: Normal work of breathing. Neurologic: No focal deficits.   Lab Results  Component Value Date   CREATININE 0.74 12/03/2021   BUN 11 12/03/2021   NA 144 12/03/2021   K 4.7 12/03/2021   CL 105 12/03/2021   CO2 24 12/03/2021   Lab Results  Component Value Date   ALT 14 12/03/2021   AST 15 12/03/2021   ALKPHOS 74 12/03/2021   BILITOT <0.2 12/03/2021   Lab Results  Component Value Date   HGBA1C 6.5 (H) 12/03/2021   Lab Results  Component Value Date   INSULIN 12.1 12/03/2021   Lab Results  Component Value Date   TSH 0.880 12/03/2021   Lab Results  Component Value Date   CHOL 217 (H) 12/03/2021   HDL 57 12/03/2021   LDLCALC 132 (H) 12/03/2021   TRIG 159 (H) 12/03/2021  CHOLHDL 3.8 12/03/2021   Lab Results  Component Value Date   VD25OH 31.5 12/03/2021   Lab Results  Component Value Date   WBC 7.0 12/03/2021   HGB 12.9 12/03/2021   HCT 41.5 12/03/2021   MCV 85 12/03/2021   PLT 328 12/03/2021   Lab Results  Component Value Date   IRON 67 12/03/2021   TIBC 441  12/03/2021   FERRITIN 32 12/03/2021   Attestation Statements:   Reviewed by clinician on day of visit: allergies, medications, problem list, medical history, surgical history, family history, social history, and previous encounter notes.  I, Water quality scientist, CMA, am acting as transcriptionist for Briscoe Deutscher, DO  I have reviewed the above documentation for accuracy and completeness, and I agree with the above. -  Briscoe Deutscher, DO, MS, FAAFP, DABOM - Family and Bariatric Medicine.

## 2022-02-16 ENCOUNTER — Other Ambulatory Visit (HOSPITAL_COMMUNITY): Payer: Self-pay

## 2022-02-16 MED ORDER — MONTELUKAST SODIUM 10 MG PO TABS
10.0000 mg | ORAL_TABLET | Freq: Every evening | ORAL | 5 refills | Status: DC
  Filled 2022-02-16: qty 30, 30d supply, fill #0
  Filled 2022-03-24: qty 30, 30d supply, fill #1
  Filled 2022-04-16: qty 30, 30d supply, fill #2
  Filled 2022-06-02: qty 30, 30d supply, fill #3
  Filled 2022-07-19: qty 30, 30d supply, fill #4

## 2022-02-19 ENCOUNTER — Other Ambulatory Visit (HOSPITAL_COMMUNITY): Payer: Self-pay

## 2022-02-19 ENCOUNTER — Encounter (INDEPENDENT_AMBULATORY_CARE_PROVIDER_SITE_OTHER): Payer: Self-pay

## 2022-02-19 ENCOUNTER — Telehealth (INDEPENDENT_AMBULATORY_CARE_PROVIDER_SITE_OTHER): Payer: Self-pay | Admitting: Family Medicine

## 2022-02-19 NOTE — Telephone Encounter (Signed)
Prior authorization approved for [facility]. Effective: 02/18/22 - 07/18/22. Patient sent approval message via mychart.  ?

## 2022-02-26 DIAGNOSIS — Z3202 Encounter for pregnancy test, result negative: Secondary | ICD-10-CM | POA: Diagnosis not present

## 2022-02-26 DIAGNOSIS — N898 Other specified noninflammatory disorders of vagina: Secondary | ICD-10-CM | POA: Diagnosis not present

## 2022-02-26 DIAGNOSIS — N750 Cyst of Bartholin's gland: Secondary | ICD-10-CM | POA: Diagnosis not present

## 2022-02-26 DIAGNOSIS — E559 Vitamin D deficiency, unspecified: Secondary | ICD-10-CM | POA: Diagnosis not present

## 2022-02-27 LAB — VITAMIN D 25 HYDROXY (VIT D DEFICIENCY, FRACTURES): Vit D, 25-Hydroxy: 50.4 ng/mL (ref 30.0–100.0)

## 2022-02-27 NOTE — Progress Notes (Signed)
vitamin D level is better.  Continue same treatment.

## 2022-03-02 ENCOUNTER — Other Ambulatory Visit (HOSPITAL_COMMUNITY): Payer: Self-pay

## 2022-03-02 MED ORDER — FLUCONAZOLE 150 MG PO TABS
150.0000 mg | ORAL_TABLET | ORAL | 0 refills | Status: DC
  Filled 2022-03-02: qty 2, 3d supply, fill #0

## 2022-03-03 ENCOUNTER — Encounter (HOSPITAL_COMMUNITY): Payer: Self-pay

## 2022-03-03 ENCOUNTER — Other Ambulatory Visit (HOSPITAL_COMMUNITY): Payer: Self-pay

## 2022-03-04 ENCOUNTER — Other Ambulatory Visit (HOSPITAL_COMMUNITY): Payer: Self-pay

## 2022-03-04 MED ORDER — ALBUTEROL SULFATE HFA 108 (90 BASE) MCG/ACT IN AERS
2.0000 | INHALATION_SPRAY | RESPIRATORY_TRACT | 0 refills | Status: DC | PRN
  Filled 2022-03-04: qty 8.5, 17d supply, fill #0

## 2022-03-05 ENCOUNTER — Telehealth: Payer: 59 | Admitting: Physician Assistant

## 2022-03-05 ENCOUNTER — Other Ambulatory Visit (HOSPITAL_COMMUNITY): Payer: Self-pay

## 2022-03-05 DIAGNOSIS — J208 Acute bronchitis due to other specified organisms: Secondary | ICD-10-CM

## 2022-03-05 DIAGNOSIS — J4541 Moderate persistent asthma with (acute) exacerbation: Secondary | ICD-10-CM

## 2022-03-05 DIAGNOSIS — B9689 Other specified bacterial agents as the cause of diseases classified elsewhere: Secondary | ICD-10-CM

## 2022-03-05 MED ORDER — DOXYCYCLINE HYCLATE 100 MG PO TABS
100.0000 mg | ORAL_TABLET | Freq: Two times a day (BID) | ORAL | 0 refills | Status: DC
  Filled 2022-03-05: qty 14, 7d supply, fill #0

## 2022-03-05 MED ORDER — PREDNISONE 20 MG PO TABS
40.0000 mg | ORAL_TABLET | Freq: Every day | ORAL | 0 refills | Status: DC
  Filled 2022-03-05: qty 10, 5d supply, fill #0

## 2022-03-05 MED ORDER — BENZONATATE 100 MG PO CAPS
100.0000 mg | ORAL_CAPSULE | Freq: Three times a day (TID) | ORAL | 0 refills | Status: DC | PRN
  Filled 2022-03-05: qty 30, 10d supply, fill #0

## 2022-03-05 NOTE — Patient Instructions (Signed)
?[name], thank you for joining Leeanne Rio, PA-C for today's virtual visit.  While this provider is not your primary care provider (PCP), if your PCP is located in our provider database this encounter information will be shared with them immediately following your visit. ? ?Consent: ?(Patient) Jennifer Davis provided verbal consent for this virtual visit at the beginning of the encounter. ? ?Current Medications: ? ?Current Outpatient Medications:  ?  albuterol (VENTOLIN HFA) 108 (90 Base) MCG/ACT inhaler, Inhale 2 puffs into the lungs every 4-6 hours as needed., Disp: 8.5 g, Rfl: 0 ?  budesonide-formoterol (SYMBICORT) 160-4.5 MCG/ACT inhaler, Inhale 2 puffs into the lungs 2 (two) times daily., Disp: 10.2 g, Rfl: 5 ?  cetirizine (ZYRTEC) 10 MG tablet, Take 10 mg by mouth daily., Disp: , Rfl:  ?  EPINEPHrine (EPIPEN 2-PAK) 0.3 mg/0.3 mL IJ SOAJ injection, Inject 0.3 mLs (0.3 mg total) into the muscle once as needed for up to 1 dose (for severe allergic reaction). CAll 911 immediately if you have to use this medicine, Disp: 1 each, Rfl: 1 ?  escitalopram (LEXAPRO) 20 MG tablet, Take 1 tablet (20 mg total) by mouth daily., Disp: 90 tablet, Rfl: 0 ?  fluconazole (DIFLUCAN) 150 MG tablet, Take 1 tablet (150 mg total) by mouth once, repeat in 3 days if needed, Disp: 2 tablet, Rfl: 0 ?  gabapentin (NEURONTIN) 100 MG capsule, Take 1 capsule (100 mg total) by mouth 3 (three) times daily., Disp: 270 capsule, Rfl: 0 ?  LORazepam (ATIVAN) 1 MG tablet, Take 0.5-1 mg by mouth every 8 (eight) hours as needed for anxiety., Disp: , Rfl:  ?  MELATONIN PO, Take 5 mg by mouth as needed., Disp: , Rfl:  ?  montelukast (SINGULAIR) 10 MG tablet, Take 10 mg by mouth at bedtime., Disp: , Rfl: 5 ?  montelukast (SINGULAIR) 10 MG tablet, Take 1 tablet (10 mg total) by mouth every evening., Disp: 30 tablet, Rfl: 5 ?  Semaglutide-Weight Management (WEGOVY) 0.25 MG/0.5ML SOAJ, Inject 0.25 mg into the skin once a week., Disp: 2 mL,  Rfl: 1 ?  Semaglutide-Weight Management (WEGOVY) 0.5 MG/0.5ML SOAJ, Inject 0.5 mg into the skin once a week., Disp: 2 mL, Rfl: 1 ?  tamoxifen (NOLVADEX) 20 MG tablet, Take 1 tablet (20 mg total) by mouth daily., Disp: 90 tablet, Rfl: 3 ?  Vitamin D, Ergocalciferol, (DRISDOL) 1.25 MG (50000 UNIT) CAPS capsule, Take 1 capsule (50,000 Units total) by mouth every 7 (seven) days., Disp: 12 capsule, Rfl: 0  ? ?Medications ordered in this encounter:  ?No orders of the defined types were placed in this encounter. ?  ? ?*If you need refills on other medications prior to your next appointment, please contact your pharmacy* ? ?Follow-Up: ?Call back or seek an in-person evaluation if the symptoms worsen or if the condition fails to improve as anticipated. ? ?Other Instructions ?Take antibiotic (Doxycycline) as directed.  Increase fluids.  Get plenty of rest. Use Mucinex for congestion. Tessalon as directed for cough. Continue your inhalers as directed. You can start the prednisone if any worsening of chest tightness or wheezing.  Take a daily probiotic (I recommend Align or Culturelle, but even Activia Yogurt may be beneficial).  A humidifier placed in the bedroom may offer some relief for a dry, scratchy throat of nasal irritation.  Read information below on acute bronchitis. Please call or return to clinic if symptoms are not improving. ? ?Acute Bronchitis ?Bronchitis is when the airways that extend from  the windpipe into the lungs get red, puffy, and painful (inflamed). Bronchitis often causes thick spit (mucus) to develop. This leads to a cough. A cough is the most common symptom of bronchitis. ?In acute bronchitis, the condition usually begins suddenly and goes away over time (usually in 2 weeks). Smoking, allergies, and asthma can make bronchitis worse. Repeated episodes of bronchitis may cause more lung problems. ? ?HOME CARE ?Rest. ?Drink enough fluids to keep your pee (urine) clear or pale yellow (unless you need to  limit fluids as told by your doctor). ?Only take over-the-counter or prescription medicines as told by your doctor. ?Avoid smoking and secondhand smoke. These can make bronchitis worse. If you are a smoker, think about using nicotine gum or skin patches. Quitting smoking will help your lungs heal faster. ?Reduce the chance of getting bronchitis again by: ?Washing your hands often. ?Avoiding people with cold symptoms. ?Trying not to touch your hands to your mouth, nose, or eyes. ?Follow up with your doctor as told. ? ?GET HELP IF: ?Your symptoms do not improve after 1 week of treatment. Symptoms include: ?Cough. ?Fever. ?Coughing up thick spit. ?Body aches. ?Chest congestion. ?Chills. ?Shortness of breath. ?Sore throat. ? ?GET HELP RIGHT AWAY IF:  ?You have an increased fever. ?You have chills. ?You have severe shortness of breath. ?You have bloody thick spit (sputum). ?You throw up (vomit) often. ?You lose too much body fluid (dehydration). ?You have a severe headache. ?You faint. ? ?MAKE SURE YOU:  ?Understand these instructions. ?Will watch your condition. ?Will get help right away if you are not doing well or get worse. ?Document Released: 04/13/2008 Document Revised: 06/28/2013 Document Reviewed: 04/18/2013 ?ExitCare? Patient Information ?2015 [employer]. This information is not intended to replace advice given to you by your health care provider. Make sure you discuss any questions you have with your health care provider. ? ? ? ?If you have been instructed to have an in-person evaluation today at a local Urgent Care facility, please use the link below. It will take you to a list of all of our available [facility], including address, phone number and hours of operation. Please do not delay care.  ?[facility] ? ?If you or a family member do not have a primary care provider, use the link below to schedule a visit and establish care. When you choose a [facility] primary care  physician or advanced practice provider, you gain a long-term partner in health. ?Find a Primary Care Provider ? ?Learn more about [facility]'s in-office and virtual care options: ?[facility]  ?

## 2022-03-05 NOTE — Progress Notes (Signed)
?Virtual Visit Consent  ? ?Jennifer Davis, you are scheduled for a virtual visit with a [facility] provider today.   ?  ?Just as with appointments in the office, your consent must be obtained to participate.  Your consent will be active for this visit and any virtual visit you may have with one of our providers in the next 365 days.   ?  ?If you have a MyChart account, a copy of this consent can be sent to you electronically.  All virtual visits are billed to your insurance company just like a traditional visit in the office.   ? ?As this is a virtual visit, video technology does not allow for your provider to perform a traditional examination.  This may limit your provider's ability to fully assess your condition.  If your provider identifies any concerns that need to be evaluated in person or the need to arrange testing (such as labs, EKG, etc.), we will make arrangements to do so.   ?  ?Although advances in technology are sophisticated, we cannot ensure that it will always work on either your end or our end.  If the connection with a video visit is poor, the visit may have to be switched to a telephone visit.  With either a video or telephone visit, we are not always able to ensure that we have a secure connection.    ? ?Also, by engaging in this virtual visit, you consent to the provision of healthcare. Additionally, you authorize for your insurance to be billed (if applicable) for the services provided during this visit.  ? ?I need to obtain your verbal consent now.   Are you willing to proceed with your visit today?  ?  ?Jennifer Davis has provided verbal consent on 03/05/2022 for a virtual visit (video or telephone). ?  ?Jennifer Rio, PA-C  ? ?Date: 03/05/2022 8:09 AM ? ? ?Virtual Visit via Video Note  ? ?ILeeanne Davis, connected with  Jennifer Davis  ([id number], 09/25/1976) on 03/05/22 at  8:00 AM EDT by a video-enabled telemedicine application and verified that I am speaking with the correct  person using two identifiers. ? ?Location: ?Patient: Virtual Visit Location Patient: Home ?Provider: Virtual Visit Location Provider: Home Office ?  ?I discussed the limitations of evaluation and management by telemedicine and the availability of in person appointments. The patient expressed understanding and agreed to proceed.   ? ?History of Present Illness: ?Jennifer Davis is a 46 y.o. who identifies as a female who was assigned female at birth, with a history of asthma and breast cancer and is being seen today for possible bronchitis. Endorses URI symptoms starting last week with milder cold symptoms. Husband and children with similar symptoms starting first but is better. She now noting increase in chest congestion and productive cough (now yellow-tinged) with occasional wheezing. Has had negative COVID tests. Has been taking OTC Mucinex, cough drops, Dayquil/Nyquil.  ? ? ? ?HPI: HPI  ?Problems:  ?Patient Active Problem List  ? Diagnosis Date Noted  ? Vitamin D deficiency 09/26/2021  ? Allergic rhinitis 09/26/2021  ? Arthritis of hip 09/26/2021  ? Port-A-Cath in place 01/22/2021  ? S/P mastectomy, bilateral 12/12/2020  ? Breast cancer ([place]) 12/04/2020  ? Genetic testing 10/02/2020  ? Malignant neoplasm of upper-outer quadrant of left breast in female, estrogen receptor positive ([place]) 09/19/2020  ? Depression 08/06/2018  ? PTSD (post-traumatic stress disorder) 08/06/2018  ? Anxiety 01/09/2016  ? Asthma 01/09/2016  ?  Morbid obesity ([place]) 01/09/2016  ?  ?Allergies:  ?Allergies  ?Allergen Reactions  ? Bee Venom Hives  ?  wheezy  ? Other Itching  ?  environmental allergies sometime activate asthma  ? ?Medications:  ?Current Outpatient Medications:  ?  benzonatate (TESSALON) 100 MG capsule, Take 1 capsule (100 mg total) by mouth 3 (three) times daily as needed for cough., Disp: 30 capsule, Rfl: 0 ?  doxycycline (VIBRA-TABS) 100 MG tablet, Take 1 tablet (100 mg total) by mouth 2 (two) times daily., Disp: 14 tablet,  Rfl: 0 ?  predniSONE (DELTASONE) 20 MG tablet, Take 2 tablets (40 mg total) by mouth daily with breakfast., Disp: 10 tablet, Rfl: 0 ?  albuterol (VENTOLIN HFA) 108 (90 Base) MCG/ACT inhaler, Inhale 2 puffs into the lungs every 4-6 hours as needed., Disp: 8.5 g, Rfl: 0 ?  budesonide-formoterol (SYMBICORT) 160-4.5 MCG/ACT inhaler, Inhale 2 puffs into the lungs 2 (two) times daily., Disp: 10.2 g, Rfl: 5 ?  cetirizine (ZYRTEC) 10 MG tablet, Take 10 mg by mouth daily., Disp: , Rfl:  ?  EPINEPHrine (EPIPEN 2-PAK) 0.3 mg/0.3 mL IJ SOAJ injection, Inject 0.3 mLs (0.3 mg total) into the muscle once as needed for up to 1 dose (for severe allergic reaction). CAll 911 immediately if you have to use this medicine, Disp: 1 each, Rfl: 1 ?  escitalopram (LEXAPRO) 20 MG tablet, Take 1 tablet (20 mg total) by mouth daily., Disp: 90 tablet, Rfl: 0 ?  fluconazole (DIFLUCAN) 150 MG tablet, Take 1 tablet (150 mg total) by mouth once, repeat in 3 days if needed, Disp: 2 tablet, Rfl: 0 ?  gabapentin (NEURONTIN) 100 MG capsule, Take 1 capsule (100 mg total) by mouth 3 (three) times daily., Disp: 270 capsule, Rfl: 0 ?  LORazepam (ATIVAN) 1 MG tablet, Take 0.5-1 mg by mouth every 8 (eight) hours as needed for anxiety., Disp: , Rfl:  ?  MELATONIN PO, Take 5 mg by mouth as needed., Disp: , Rfl:  ?  montelukast (SINGULAIR) 10 MG tablet, Take 10 mg by mouth at bedtime., Disp: , Rfl: 5 ?  montelukast (SINGULAIR) 10 MG tablet, Take 1 tablet (10 mg total) by mouth every evening., Disp: 30 tablet, Rfl: 5 ?  Semaglutide-Weight Management (WEGOVY) 0.25 MG/0.5ML SOAJ, Inject 0.25 mg into the skin once a week., Disp: 2 mL, Rfl: 1 ?  Semaglutide-Weight Management (WEGOVY) 0.5 MG/0.5ML SOAJ, Inject 0.5 mg into the skin once a week., Disp: 2 mL, Rfl: 1 ?  tamoxifen (NOLVADEX) 20 MG tablet, Take 1 tablet (20 mg total) by mouth daily., Disp: 90 tablet, Rfl: 3 ?  Vitamin D, Ergocalciferol, (DRISDOL) 1.25 MG (50000 UNIT) CAPS capsule, Take 1 capsule (50,000  Units total) by mouth every 7 (seven) days., Disp: 12 capsule, Rfl: 0 ? ?Observations/Objective: ?Patient is well-developed, well-nourished in no acute distress.  ?Resting comfortably at home.  ?Head is normocephalic, atraumatic.  ?No labored breathing. ?Speech is clear and coherent with logical content.  ?Patient is alert and oriented at baseline.  ? ?Assessment and Plan: ?1. Acute bacterial bronchitis ?- doxycycline (VIBRA-TABS) 100 MG tablet; Take 1 tablet (100 mg total) by mouth 2 (two) times daily.  Dispense: 14 tablet; Refill: 0 ?- benzonatate (TESSALON) 100 MG capsule; Take 1 capsule (100 mg total) by mouth 3 (three) times daily as needed for cough.  Dispense: 30 capsule; Refill: 0 ? ?2. Moderate persistent asthma with exacerbation ?- benzonatate (TESSALON) 100 MG capsule; Take 1 capsule (100 mg total) by mouth 3 (three)  times daily as needed for cough.  Dispense: 30 capsule; Refill: 0 ?- predniSONE (DELTASONE) 20 MG tablet; Take 2 tablets (40 mg total) by mouth daily with breakfast.  Dispense: 10 tablet; Refill: 0 ? ?Rx Doxycycline.  Increase fluids.  Rest.  Saline nasal spray.  Probiotic.  Mucinex as directed.  Humidifier in bedroom. Tessalon per orders. Continue inhalers as directed, adding on the 5-day burst of prednisone if any worsening of chest tightness, wheezing or increased use of rescue inhaler.  Call or return to clinic if symptoms are not improving. ? ? ?Follow Up Instructions: ?I discussed the assessment and treatment plan with the patient. The patient was provided an opportunity to ask questions and all were answered. The patient agreed with the plan and demonstrated an understanding of the instructions.  A copy of instructions were sent to the patient via MyChart unless otherwise noted below.  ? ?The patient was advised to call back or seek an in-person evaluation if the symptoms worsen or if the condition fails to improve as anticipated. ? ?Time:  ?I spent 10 minutes with the patient via  telehealth technology discussing the above problems/concerns.   ? ?Jennifer Rio, PA-C ?

## 2022-03-10 DIAGNOSIS — N898 Other specified noninflammatory disorders of vagina: Secondary | ICD-10-CM | POA: Diagnosis not present

## 2022-03-10 DIAGNOSIS — N750 Cyst of Bartholin's gland: Secondary | ICD-10-CM | POA: Diagnosis not present

## 2022-03-11 ENCOUNTER — Other Ambulatory Visit (HOSPITAL_COMMUNITY): Payer: Self-pay

## 2022-03-13 ENCOUNTER — Other Ambulatory Visit (HOSPITAL_COMMUNITY): Payer: Self-pay

## 2022-03-16 ENCOUNTER — Other Ambulatory Visit (HOSPITAL_COMMUNITY): Payer: Self-pay

## 2022-03-24 ENCOUNTER — Other Ambulatory Visit (HOSPITAL_COMMUNITY): Payer: Self-pay

## 2022-03-24 DIAGNOSIS — E559 Vitamin D deficiency, unspecified: Secondary | ICD-10-CM | POA: Diagnosis not present

## 2022-03-24 DIAGNOSIS — R632 Polyphagia: Secondary | ICD-10-CM | POA: Diagnosis not present

## 2022-03-24 DIAGNOSIS — Z853 Personal history of malignant neoplasm of breast: Secondary | ICD-10-CM | POA: Diagnosis not present

## 2022-03-24 DIAGNOSIS — Z9189 Other specified personal risk factors, not elsewhere classified: Secondary | ICD-10-CM | POA: Diagnosis not present

## 2022-03-24 DIAGNOSIS — E669 Obesity, unspecified: Secondary | ICD-10-CM | POA: Diagnosis not present

## 2022-03-24 MED ORDER — WEGOVY 0.5 MG/0.5ML ~~LOC~~ SOAJ
0.5000 mg | SUBCUTANEOUS | 0 refills | Status: DC
  Filled 2022-03-24 – 2022-04-15 (×2): qty 2, 28d supply, fill #0

## 2022-04-03 ENCOUNTER — Other Ambulatory Visit (HOSPITAL_COMMUNITY): Payer: Self-pay

## 2022-04-03 MED ORDER — FLUCONAZOLE 150 MG PO TABS
ORAL_TABLET | ORAL | 0 refills | Status: DC
  Filled 2022-04-03: qty 2, 3d supply, fill #0

## 2022-04-13 DIAGNOSIS — R238 Other skin changes: Secondary | ICD-10-CM | POA: Diagnosis not present

## 2022-04-15 ENCOUNTER — Other Ambulatory Visit (HOSPITAL_COMMUNITY): Payer: Self-pay

## 2022-04-16 ENCOUNTER — Other Ambulatory Visit (HOSPITAL_COMMUNITY): Payer: Self-pay

## 2022-04-16 MED ORDER — OZEMPIC (1 MG/DOSE) 4 MG/3ML ~~LOC~~ SOPN
1.0000 mg | PEN_INJECTOR | SUBCUTANEOUS | 0 refills | Status: DC
  Filled 2022-04-16 – 2022-05-04 (×2): qty 3, 28d supply, fill #0

## 2022-04-17 ENCOUNTER — Other Ambulatory Visit (HOSPITAL_COMMUNITY): Payer: Self-pay

## 2022-04-30 ENCOUNTER — Other Ambulatory Visit (HOSPITAL_COMMUNITY): Payer: Self-pay

## 2022-04-30 DIAGNOSIS — E8881 Metabolic syndrome: Secondary | ICD-10-CM | POA: Diagnosis not present

## 2022-04-30 DIAGNOSIS — N941 Unspecified dyspareunia: Secondary | ICD-10-CM | POA: Diagnosis not present

## 2022-04-30 DIAGNOSIS — R253 Fasciculation: Secondary | ICD-10-CM | POA: Diagnosis not present

## 2022-04-30 DIAGNOSIS — Z9013 Acquired absence of bilateral breasts and nipples: Secondary | ICD-10-CM | POA: Diagnosis not present

## 2022-04-30 DIAGNOSIS — R202 Paresthesia of skin: Secondary | ICD-10-CM | POA: Diagnosis not present

## 2022-04-30 DIAGNOSIS — E669 Obesity, unspecified: Secondary | ICD-10-CM | POA: Diagnosis not present

## 2022-05-01 DIAGNOSIS — N898 Other specified noninflammatory disorders of vagina: Secondary | ICD-10-CM | POA: Diagnosis not present

## 2022-05-01 DIAGNOSIS — N761 Subacute and chronic vaginitis: Secondary | ICD-10-CM | POA: Diagnosis not present

## 2022-05-04 ENCOUNTER — Other Ambulatory Visit: Payer: Self-pay | Admitting: Physician Assistant

## 2022-05-04 ENCOUNTER — Other Ambulatory Visit (HOSPITAL_COMMUNITY): Payer: Self-pay

## 2022-05-04 DIAGNOSIS — N761 Subacute and chronic vaginitis: Secondary | ICD-10-CM | POA: Diagnosis not present

## 2022-05-04 DIAGNOSIS — N898 Other specified noninflammatory disorders of vagina: Secondary | ICD-10-CM | POA: Diagnosis not present

## 2022-05-04 MED ORDER — FLUCONAZOLE 150 MG PO TABS
ORAL_TABLET | ORAL | 0 refills | Status: DC
Start: 2022-05-04 — End: 2022-07-03
  Filled 2022-05-04: qty 2, 4d supply, fill #0

## 2022-05-04 MED ORDER — FLUCONAZOLE 150 MG PO TABS
150.0000 mg | ORAL_TABLET | ORAL | 6 refills | Status: DC
Start: 2022-05-04 — End: 2022-05-21
  Filled 2022-05-04 – 2022-05-15 (×2): qty 4, 28d supply, fill #0

## 2022-05-04 MED ORDER — NYSTATIN 100000 UNIT/GM EX OINT
TOPICAL_OINTMENT | Freq: Two times a day (BID) | CUTANEOUS | 2 refills | Status: DC
  Filled 2022-05-04: qty 30, 14d supply, fill #0

## 2022-05-04 MED ORDER — ESCITALOPRAM OXALATE 20 MG PO TABS
20.0000 mg | ORAL_TABLET | Freq: Every day | ORAL | 0 refills | Status: DC
  Filled 2022-05-04: qty 90, 90d supply, fill #0

## 2022-05-07 ENCOUNTER — Encounter: Payer: Self-pay | Admitting: Physician Assistant

## 2022-05-07 ENCOUNTER — Ambulatory Visit: Payer: 59 | Admitting: Physician Assistant

## 2022-05-07 DIAGNOSIS — F411 Generalized anxiety disorder: Secondary | ICD-10-CM | POA: Diagnosis not present

## 2022-05-07 DIAGNOSIS — E559 Vitamin D deficiency, unspecified: Secondary | ICD-10-CM

## 2022-05-07 DIAGNOSIS — F3342 Major depressive disorder, recurrent, in full remission: Secondary | ICD-10-CM | POA: Diagnosis not present

## 2022-05-07 MED ORDER — VITAMIN D 50 MCG (2000 UT) PO TABS
2000.0000 [IU] | ORAL_TABLET | Freq: Every day | ORAL | Status: AC
Start: 2022-05-07 — End: ?

## 2022-05-07 NOTE — Progress Notes (Signed)
[facility] Med Check  Patient ID: Jennifer Davis,  MRN: [id number]  PCP: Wenda Low, MD  Date of Evaluation: 05/07/2022 Time spent:30 minutes   Chief Complaint:  Chief Complaint   Anxiety; Depression; Insomnia; Follow-up     HISTORY/CURRENT STATUS: HPI For routine med check.   Has been in healthy weight and wellness program, through [facility]. Has lost about 30#. Happy about that.   Patient denies loss of interest in usual activities and is able to enjoy things.  Denies decreased energy.  Denies decreased motivation.  Work is going well.  ADLs and personal hygiene are normal.  Appetite has not changed.   No extreme sadness, tearfulness, or feelings of hopelessness.  Sleeps well most of the time.  Denies any changes in concentration, making decisions or remembering things. Denies suicidal or homicidal thoughts.  States she sometimes has a feeling like she has tremor inside. Varies from muscle groups.  It is random.  In the evening she does have feelings of discomfort in her legs.  No shaking.  Not really sure when this started.  She did mention it to her primary provider a few weeks ago and labs were drawn to rule out electrolyte imbalance.  Patient denies increased energy with decreased need for sleep, no increased talkativeness, no racing thoughts, no impulsivity or risky behaviors, no increased spending, no increased libido, no grandiosity, no increased irritability or anger, and no hallucinations.  Denies dizziness, syncope, seizures, numbness, tingling, tics, unsteady gait, slurred speech, confusion.  No dystonia.   Individual Medical History/ Review of Systems: Changes? :No      Past medications for mental health diagnoses include: Lexapro, Prozac wasn't effective, Xanax, Buspar worked but d/c when didn't need it.  Allergies: Bee venom and Other  Current Medications:  Current Outpatient Medications:    albuterol (VENTOLIN HFA) 108 (90 Base) MCG/ACT inhaler, Inhale 2  puffs into the lungs every 4-6 hours as needed., Disp: 8.5 g, Rfl: 0   budesonide-formoterol (SYMBICORT) 160-4.5 MCG/ACT inhaler, Inhale 2 puffs into the lungs 2 (two) times daily., Disp: 10.2 g, Rfl: 5   cetirizine (ZYRTEC) 10 MG tablet, Take 10 mg by mouth daily., Disp: , Rfl:    Cholecalciferol (VITAMIN D) 50 MCG (2000 UT) tablet, Take 1 tablet (2,000 Units total) by mouth daily., Disp: , Rfl:    escitalopram (LEXAPRO) 20 MG tablet, Take 1 tablet (20 mg total) by mouth daily., Disp: 90 tablet, Rfl: 0   fluconazole (DIFLUCAN) 150 MG tablet, Take 1 tablet by mouth now, and repeat in 3 days, Disp: 2 tablet, Rfl: 0   fluconazole (DIFLUCAN) 150 MG tablet, Take 1 tablet (150 mg total) by mouth once a week., Disp: 4 tablet, Rfl: 6   gabapentin (NEURONTIN) 100 MG capsule, Take 1 capsule (100 mg total) by mouth 3 (three) times daily. (Patient taking differently: Take 200 mg by mouth at bedtime.), Disp: 270 capsule, Rfl: 0   MELATONIN PO, Take 5 mg by mouth as needed., Disp: , Rfl:    montelukast (SINGULAIR) 10 MG tablet, Take 10 mg by mouth at bedtime., Disp: , Rfl: 5   montelukast (SINGULAIR) 10 MG tablet, Take 1 tablet (10 mg total) by mouth every evening., Disp: 30 tablet, Rfl: 5   nystatin ointment (MYCOSTATIN), Apply 1 application externally 2 (two) times daily for 14 days, Disp: 30 g, Rfl: 2   Probiotic Product ([employer] PROBIOTIC PO), Take by mouth., Disp: , Rfl:    tamoxifen (NOLVADEX) 20 MG tablet, Take 1 tablet (20  mg total) by mouth daily., Disp: 90 tablet, Rfl: 3   EPINEPHrine (EPIPEN 2-PAK) 0.3 mg/0.3 mL IJ SOAJ injection, Inject 0.3 mLs (0.3 mg total) into the muscle once as needed for up to 1 dose (for severe allergic reaction). CAll 911 immediately if you have to use this medicine (Patient not taking: Reported on 05/07/2022), Disp: 1 each, Rfl: 1   LORazepam (ATIVAN) 1 MG tablet, Take 0.5-1 mg by mouth every 8 (eight) hours as needed for anxiety. (Patient not taking: Reported on 05/07/2022), Disp:  , Rfl:    Semaglutide, 1 MG/DOSE, (OZEMPIC, 1 MG/DOSE,) 4 MG/3ML SOPN, Inject 1 mg into the skin once a week. (Patient not taking: Reported on 05/07/2022), Disp: 3 mL, Rfl: 0   Semaglutide-Weight Management (WEGOVY) 0.5 MG/0.5ML SOAJ, Inject 0.5 mg into the skin once a week. (Patient not taking: Reported on 05/07/2022), Disp: 2 mL, Rfl: 1   Semaglutide-Weight Management (WEGOVY) 0.5 MG/0.5ML SOAJ, Inject 0.5 mg into the skin once a week. (Patient not taking: Reported on 05/07/2022), Disp: 2 mL, Rfl: 0   Vitamin D, Ergocalciferol, (DRISDOL) 1.25 MG (50000 UNIT) CAPS capsule, Take 1 capsule (50,000 Units total) by mouth every 7 (seven) days. (Patient not taking: Reported on 05/07/2022), Disp: 12 capsule, Rfl: 0 Medication Side Effects: none  Family Medical/ Social History: Changes?    MENTAL HEALTH EXAM:  There were no vitals taken for this visit.There is no height or weight on file to calculate BMI.  General Appearance: Casual, Well Groomed, and Obese  Eye Contact:  Good  Speech:  Clear and Coherent and Normal Rate  Volume:  Normal  Mood:  Euthymic  Affect:  Congruent  Thought Process:  Goal Directed and Descriptions of Associations: Circumstantial  Orientation:  Full (Time, Place, and Person)  Thought Content: Logical   Suicidal Thoughts:  No  Homicidal Thoughts:  No  Memory:  WNL  Judgement:  Good  Insight:  Good  Psychomotor Activity:  Normal and no tremor  Concentration:  Concentration: Good and Attention Span: Good  Recall:  Good  Fund of Knowledge: Good  Language: Good  Assets:  Desire for Improvement Financial Resources/Insurance Housing Transportation Vocational/Educational  ADL's:  Intact  Cognition: WNL  Prognosis:  Good   02/26/2022 Labs Vitamin D 50.4  6/02023 Through [facility] CBC, CMP, B12, Mg, all nl.   DIAGNOSES:    ICD-10-CM   1. Recurrent major depressive disorder, in full remission ([place])  F33.42     2. Generalized anxiety disorder  F41.1     3.  Hypovitaminosis D  E55.9      Receiving Psychotherapy: No   RECOMMENDATIONS:  PDMP reviewed.  [facility] filled 01/06/2021. I provided 30 minutes of face to face time during this encounter, including time spent before and after the visit in records review, medical decision making, counseling pertinent to today's visit, and charting.  She's doing well w/ her psych meds so no changes.  Recommend increasing gabapentin in the evening, hopefully that will help with what sounds like mild restless leg syndrome.  It does not explain the different muscle groups sensation she has though.  She has not tolerated gabapentin during the day in the past, it makes her drowsy, so will only do the night dose for now.  She will let me know if it does not improve. Vitamin D level a few months ago was 50.4.  Can take over-the-counter vitamin D now.  Start OTC vitamin D 2000 IUs daily. Continue Lexapro 20 mg, 1 p.o. daily.  Increase gabapentin 100 mg, to 3 p.o. nightly. Continue Ativan 1 mg, 1/2-1 3 times daily as needed anxiety. (She rarely takes.) Return in 6 months.   Donnal Moat, PA-C

## 2022-05-07 NOTE — Progress Notes (Signed)
Patient Care Team: Wenda Low, MD as PCP - General (Internal Medicine) Coralie Keens, MD as Consulting Physician (General Surgery) Nicholas Lose, MD as Consulting Physician (Hematology and Oncology) Gery Pray, MD as Consulting Physician (Radiation Oncology)  DIAGNOSIS:  Encounter Diagnosis  Name Primary?   Malignant neoplasm of upper-outer quadrant of left breast in female, estrogen receptor positive ([place])     SUMMARY OF ONCOLOGIC HISTORY: Oncology History  Malignant neoplasm of upper-outer quadrant of left breast in female, estrogen receptor positive ([place])  09/19/2020 Initial Diagnosis   Mammogram in 07/2018 showed probably benign bilateral breast masses that were stable on her last mammogram on 07/26/19. Mammogram on 08/28/20 showed the stable bilateral masses, a new 0.9cm mass at the 1 o'clock position in the left breast, and one abnormal left axillary lymph node with 0.6cm cortical thickening. 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    10/02/2020 Genetic Testing   Negative genetic testing: no pathogenic variants detected in [facility] Common-Hereditary Cancers Panel.  Variants of uncertain significance detected in APC c.6918T>A (p.Asp2306Glu) and POLE  c.2612G>C (p.Ser871Thr).  The report date is November 24. 2021.   The Common Hereditary Cancers Panel offered by [facility] includes sequencing and/or deletion duplication testing of the following 48 genes: APC, ATM, AXIN2, BARD1, BMPR1A, BRCA1, BRCA2, BRIP1, CDH1, CDK4, CDKN2A (p14ARF), CDKN2A (p16INK4a), CHEK2, CTNNA1, DICER1, EPCAM (Deletion/duplication testing only), GREM1 (promoter region deletion/duplication testing only), KIT, MEN1, MLH1, MSH2, MSH3, MSH6, MUTYH, NBN, NF1, NHTL1, PALB2, PDGFRA, PMS2, POLD1, POLE, PTEN, RAD50, RAD51C, RAD51D, RNF43, SDHB, SDHC, SDHD, SMAD4, SMARCA4. STK11, TP53, TSC1, TSC2, and VHL.  The  following genes were evaluated for sequence changes only: SDHA and HOXB13 c.251G>A variant only.   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    12/04/2020 Cancer Staging   Staging form: Breast, AJCC 8th Edition - Pathologic stage from 12/04/2020: Stage IA (pT1c, pN1a, cM0, G2, ER+, PR+, HER2-) - Signed by [name], NP on 12/18/2020 Histologic grading system: 3 grade system   12/04/2020 Miscellaneous   MammaPrint: High risk: Luminal type B, 5-year metastasis free survival with chemo and hormone therapy: 93%, absolute benefit from chemo greater than 12%, average 10-year risk of recurrence untreated: 29%   01/22/2021 - 06/19/2021 Chemotherapy   Patient is on Treatment Plan : BREAST ADJUVANT DOSE DENSE AC q14d / PACLitaxel q7d     07/09/2021 - 08/22/2021 Radiation Therapy   07/09/2021 through 08/22/2021 Site Technique Total Dose (Gy) Dose per Fx (Gy) Completed Fx Beam Energies  Chest Wall, Left: CW_Lt 3D 50/50 2 25/25 10X, 15X  Sclav-LT: SCV_Lt 3D 50/50 2 25/25 10X, 15X  Chest Wall, Left: CW_Lt_Bst Electron 10/10 2 5/5 6E    09/2021 -  Anti-estrogen oral therapy   Tamoxifen daily     CHIEF COMPLIANT: Follow-up of left breast cancer on Tamoxifen  INTERVAL HISTORY: Jennifer Davis is a 46  y.o. with above-mentioned history of left breast cancer. Currently on Tamoxifen. She presents to the clinic today for a follow-up. She states that she has been going to health and wellness and she has been loosing weight. She has lost at least 30 pounds. She also notice her strength is getting better. She complains of body aches and stiffness all  over. She also believes she has internal trimmers. She also complains of easily bruising bad headaches and fatigue. She notice a rash on stomach and arms and she  didn't know where it was coming from. She is not tolerating the tamoxifen well.   ALLERGIES:  is allergic to bee venom and other.  MEDICATIONS:  Current Outpatient Medications  Medication Sig Dispense Refill   albuterol (VENTOLIN HFA) 108 (90 Base) MCG/ACT inhaler Inhale 2 puffs into the lungs every 4-6 hours as needed. 8.5 g 0   budesonide-formoterol (SYMBICORT) 160-4.5 MCG/ACT inhaler Inhale 2 puffs into the lungs 2 (two) times daily. 10.2 g 5   cetirizine (ZYRTEC) 10 MG tablet Take 10 mg by mouth daily.     Cholecalciferol (VITAMIN D) 50 MCG (2000 UT) tablet Take 1 tablet (2,000 Units total) by mouth daily.     EPINEPHrine (EPIPEN 2-PAK) 0.3 mg/0.3 mL IJ SOAJ injection Inject 0.3 mLs (0.3 mg total) into the muscle once as needed for up to 1 dose (for severe allergic reaction). CAll 911 immediately if you have to use this medicine 1 each 1   escitalopram (LEXAPRO) 20 MG tablet Take 1 tablet (20 mg total) by mouth daily. 90 tablet 0   fluconazole (DIFLUCAN) 150 MG tablet Take 1 tablet by mouth now, and repeat in 3 days (Patient taking differently: once. Once a week) 2 tablet 0   gabapentin (NEURONTIN) 100 MG capsule Take 1 capsule (100 mg total) by mouth 3 (three) times daily. (Patient taking differently: Take 200 mg by mouth at bedtime.) 270 capsule 0   LORazepam (ATIVAN) 1 MG tablet Take 0.5-1 mg by mouth every 8 (eight) hours as needed for anxiety.     MELATONIN PO Take 5 mg by mouth as needed.     montelukast (SINGULAIR) 10 MG tablet Take 1 tablet (10 mg total) by mouth every evening. 30 tablet 5   nystatin ointment (MYCOSTATIN) Apply 1 application externally 2 (two) times daily for 14 days 30 g 2   Probiotic Product ([employer] PROBIOTIC PO) Take by mouth.     Semaglutide, 1 MG/DOSE, (OZEMPIC, 1 MG/DOSE,) 4 MG/3ML SOPN Inject 1 mg into the skin once a week. 3 mL 0   Semaglutide-Weight Management (WEGOVY) 0.5 MG/0.5ML SOAJ Inject 0.5 mg into the skin once a week. 2 mL 0   valACYclovir (VALTREX)  1000 MG tablet Take 1 tablet (1,000 mg total) by mouth 2 (two) times daily. 14 tablet 0   tamoxifen (NOLVADEX) 20 MG tablet Take 0.5 tablets (10 mg total) by mouth daily. 90 tablet 3   No current facility-administered medications for this visit.    PHYSICAL EXAMINATION: ECOG PERFORMANCE STATUS: 1 - Symptomatic but completely ambulatory  Vitals:   05/21/22 1358  BP: 134/90  Pulse: 93  Resp: 17  Temp: 97.7 F (36.5 C)  SpO2: 98%   Filed Weights   05/21/22 1358  Weight: 194 lb 4.8 oz (88.1 kg)    BREAST: Right mastectomy scar without any problems or concerns.  Left breast reconstructed.  (exam performed in the presence of a chaperone)  LABORATORY DATA:  I have reviewed the data as listed    Latest Ref Rng & Units 12/03/2021    9:00 AM 06/19/2021    1:30 PM 06/12/2021   11:14 AM  CMP  Glucose 70 - 99 mg/dL 95  143  128   BUN 6 - 24 mg/dL 11  9  8   Creatinine 0.57 - 1.00 mg/dL 0.74  0.80    0.74   Sodium 134 - 144 mmol/L 144  141  141   Potassium 3.5 - 5.2 mmol/L 4.7  3.8  3.5   Chloride 96 - 106 mmol/L 105  107  106   CO2 20 - 29 mmol/L 24  24  25   Calcium 8.7 - 10.2 mg/dL 9.3  9.1  9.0   Total Protein 6.0 - 8.5 g/dL 6.6  6.7  6.4   Total Bilirubin 0.0 - 1.2 mg/dL <0.2  0.2  0.2   Alkaline Phos 44 - 121 IU/L 74  83  74   AST 0 - 40 IU/L 15  33  32   ALT 0 - 32 IU/L 14  50  51     Lab Results  Component Value Date   WBC 7.0 12/03/2021   HGB 12.9 12/03/2021   HCT 41.5 12/03/2021   MCV 85 12/03/2021   PLT 328 12/03/2021   NEUTROABS 4.8 12/03/2021    ASSESSMENT & PLAN:  Malignant neoplasm of upper-outer quadrant of left breast in female, estrogen receptor positive ([facility]) 09/20/2019: Screening mammogram on 08/28/20 showed the stable bilateral masses, a new 0.9cm mass at the 1 o'clock position in the left breast, and one abnormal left axillary lymph node with 0.6cm cortical thickening. Biopsy showed invasive and in situ ductal carcinoma in the breast and axilla, grade 2,  HER-2 equivocal by IHC (2+), ER+ 90%, PR+ 60%, Ki67 25%.  MammaPrint: High risk: Luminal type B, 5-year metastasis free survival with chemo and hormone therapy: 93%, absolute benefit from chemo greater than 12%, average 10-year risk of recurrence untreated: 29%   Treatment plan: 1.  :Bilateral mastectomies with reconstruction ([place]): Right mastectomy: no malignancy Left mastectomy: invasive and in situ ductal carcinoma, 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  2.  adjuvant chemotherapy with dose dense Adriamycin and Cytoxan followed by Taxol completed 06/19/2021 3.  Adjuvant radiation 07/09/2021-08/22/2021 4.  Followed by adjuvant antiestrogen therapy with tamoxifen to start 09/09/2021 ------------------------------------------------------------------------------------------------------------- Current treatment: Antiestrogen therapy with tamoxifen daily started 09/09/2021 Tamoxifen toxicities: 1.  Diffuse body aches and pains 2. overall feeling of fatigue  We will decrease the dosage of tamoxifen to 10 mg daily  Shingles: Sent a prescription for Valtrex  Breast cancer surveillance: 1.  Breast exam 05/21/2022: Benign 2. mammogram: No role of mammograms since she had right-sided mastectomy and left breast reconstructed.  Return to clinic in 1 year for follow-up    No orders of the defined types were placed in this encounter.  The patient has a good understanding of the overall plan. she agrees with it. she will call with any problems that may develop before the next visit here. Total time spent: 30 mins including face to face time and time spent for planning, charting and co-ordination of care   Viinay K , MD 05/21/22    I Deritra, Mcnairy am scribing for Dr.   I have reviewed the above documentation for accuracy and completeness, and I agree with the above.   

## 2022-05-15 ENCOUNTER — Other Ambulatory Visit (HOSPITAL_COMMUNITY): Payer: Self-pay

## 2022-05-21 ENCOUNTER — Other Ambulatory Visit: Payer: Self-pay

## 2022-05-21 ENCOUNTER — Other Ambulatory Visit (HOSPITAL_COMMUNITY): Payer: Self-pay

## 2022-05-21 ENCOUNTER — Inpatient Hospital Stay: Payer: 59 | Attending: Hematology and Oncology | Admitting: Hematology and Oncology

## 2022-05-21 DIAGNOSIS — Z170 Estrogen receptor positive status [ER+]: Secondary | ICD-10-CM | POA: Insufficient documentation

## 2022-05-21 DIAGNOSIS — Z79810 Long term (current) use of selective estrogen receptor modulators (SERMs): Secondary | ICD-10-CM | POA: Insufficient documentation

## 2022-05-21 DIAGNOSIS — C773 Secondary and unspecified malignant neoplasm of axilla and upper limb lymph nodes: Secondary | ICD-10-CM | POA: Diagnosis not present

## 2022-05-21 DIAGNOSIS — C50412 Malignant neoplasm of upper-outer quadrant of left female breast: Secondary | ICD-10-CM | POA: Insufficient documentation

## 2022-05-21 MED ORDER — TAMOXIFEN CITRATE 20 MG PO TABS: 10.0000 mg | ORAL_TABLET | Freq: Every day | ORAL | 3 refills | Status: DC

## 2022-05-21 MED ORDER — VALACYCLOVIR HCL 1 G PO TABS
1000.0000 mg | ORAL_TABLET | Freq: Two times a day (BID) | ORAL | 0 refills | Status: DC
Start: 2022-05-21 — End: 2022-06-02
  Filled 2022-05-21: qty 14, 7d supply, fill #0

## 2022-05-21 NOTE — Assessment & Plan Note (Addendum)
09/20/2019: Screening mammogram on 08/28/20 showed the stable bilateral masses, a new 0.9cm mass at the 1 o'clock position in the left breast, and one abnormal left axillary lymph node with 0.6cm cortical thickening. 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 MammaPrint: High risk: Luminal type B, 5-year metastasis free survival with chemo and hormone therapy: 93%, absolute benefit from chemo greater than 12%, average 10-year risk of recurrence untreated: 29%  Treatment plan: 1.:Bilateral mastectomies with reconstruction Jennifer Davis): Right mastectomy: no malignancy Left mastectomy: invasive and in situ ductal carcinoma, 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 2.adjuvant chemotherapy with dose dense Adriamycin and Cytoxan followed by Taxol completed 06/19/2021 3.Adjuvant radiation 07/09/2021-08/22/2021 4.Followed by adjuvant antiestrogen therapy with tamoxifen to start 09/09/2021 ------------------------------------------------------------------------------------------------------------- Current treatment: Antiestrogen therapy with tamoxifen daily started 09/09/2021 Tamoxifen toxicities: 1.  Diffuse body aches and pains 2. overall feeling of fatigue  We will decrease the dosage of tamoxifen to 10 mg daily  Shingles: Sent a prescription for Valtrex  Breast cancer surveillance: 1.  Breast exam 05/21/2022: Benign 2. mammogram: No role of mammograms since she had right-sided mastectomy and left breast reconstructed.  Return to clinic in 1 year for follow-up

## 2022-05-22 ENCOUNTER — Telehealth: Payer: Self-pay | Admitting: Hematology and Oncology

## 2022-05-22 NOTE — Telephone Encounter (Signed)
Scheduled appointment per 7/13 los. Patient is aware.

## 2022-05-28 DIAGNOSIS — Z9013 Acquired absence of bilateral breasts and nipples: Secondary | ICD-10-CM | POA: Diagnosis not present

## 2022-05-28 DIAGNOSIS — R253 Fasciculation: Secondary | ICD-10-CM | POA: Diagnosis not present

## 2022-05-28 DIAGNOSIS — R202 Paresthesia of skin: Secondary | ICD-10-CM | POA: Diagnosis not present

## 2022-05-28 DIAGNOSIS — E8881 Metabolic syndrome: Secondary | ICD-10-CM | POA: Diagnosis not present

## 2022-05-28 DIAGNOSIS — F419 Anxiety disorder, unspecified: Secondary | ICD-10-CM | POA: Diagnosis not present

## 2022-05-28 DIAGNOSIS — E119 Type 2 diabetes mellitus without complications: Secondary | ICD-10-CM | POA: Diagnosis not present

## 2022-05-28 DIAGNOSIS — R7303 Prediabetes: Secondary | ICD-10-CM | POA: Diagnosis not present

## 2022-05-28 DIAGNOSIS — N941 Unspecified dyspareunia: Secondary | ICD-10-CM | POA: Diagnosis not present

## 2022-06-02 ENCOUNTER — Other Ambulatory Visit (HOSPITAL_COMMUNITY): Payer: Self-pay

## 2022-06-02 ENCOUNTER — Ambulatory Visit: Payer: 59 | Admitting: Plastic Surgery

## 2022-06-02 ENCOUNTER — Encounter: Payer: Self-pay | Admitting: Plastic Surgery

## 2022-06-02 DIAGNOSIS — C50919 Malignant neoplasm of unspecified site of unspecified female breast: Secondary | ICD-10-CM

## 2022-06-02 DIAGNOSIS — E1169 Type 2 diabetes mellitus with other specified complication: Secondary | ICD-10-CM | POA: Diagnosis not present

## 2022-06-02 DIAGNOSIS — Z9013 Acquired absence of bilateral breasts and nipples: Secondary | ICD-10-CM | POA: Diagnosis not present

## 2022-06-02 DIAGNOSIS — C50412 Malignant neoplasm of upper-outer quadrant of left female breast: Secondary | ICD-10-CM

## 2022-06-02 DIAGNOSIS — Z0000 Encounter for general adult medical examination without abnormal findings: Secondary | ICD-10-CM | POA: Diagnosis not present

## 2022-06-02 DIAGNOSIS — Z170 Estrogen receptor positive status [ER+]: Secondary | ICD-10-CM | POA: Diagnosis not present

## 2022-06-02 DIAGNOSIS — Z13220 Encounter for screening for lipoid disorders: Secondary | ICD-10-CM | POA: Diagnosis not present

## 2022-06-02 DIAGNOSIS — J45909 Unspecified asthma, uncomplicated: Secondary | ICD-10-CM | POA: Diagnosis not present

## 2022-06-02 DIAGNOSIS — F411 Generalized anxiety disorder: Secondary | ICD-10-CM | POA: Diagnosis not present

## 2022-06-02 DIAGNOSIS — E559 Vitamin D deficiency, unspecified: Secondary | ICD-10-CM | POA: Diagnosis not present

## 2022-06-02 MED ORDER — ALBUTEROL SULFATE HFA 108 (90 BASE) MCG/ACT IN AERS
2.0000 | INHALATION_SPRAY | RESPIRATORY_TRACT | 0 refills | Status: DC
  Filled 2022-06-02: qty 8.5, 17d supply, fill #0

## 2022-06-02 NOTE — Progress Notes (Signed)
   Subjective:    Patient ID: Jennifer Davis, female    DOB: 1976/09/22, 46 y.o.   MRN: [id number]  The patient is a 46 year old female here for follow-up on her breast reconstruction.  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  She had a lymph node positive as well.  She underwent bilateral mastectomies on January 2022.  She was 4 feet 11 inches tall and weighed 221 pounds.  Her preoperative bra size was a 40 D.  She has lost weight and been doing really good with weight reduction.  She had exposure of the expander on the right side so we had to remove it in February and then went back to surgery in May for removal of a breast seroma.  She had trouble healing so went back to the OR in June.  Since then she has done very well.  She wants to proceed with reconstruction.  She has the left breast expander in place and expanded and receive radiation. The left expander has 350 / 535 cc.     Review of Systems  Constitutional: Negative.   Eyes: Negative.   Respiratory: Negative.    Cardiovascular: Negative.   Gastrointestinal: Negative.   Endocrine: Negative.   Genitourinary: Negative.   Musculoskeletal: Negative.   Skin: Negative.        Objective:   Physical Exam Constitutional:      Appearance: Normal appearance.  Cardiovascular:     Rate and Rhythm: Normal rate.     Pulses: Normal pulses.  Pulmonary:     Effort: Pulmonary effort is normal.  Musculoskeletal:     Cervical back: Normal range of motion.  Skin:    General: Skin is warm.     Capillary Refill: Capillary refill takes less than 2 seconds.  Neurological:     Mental Status: She is alert and oriented to person, place, and time.  Psychiatric:        Mood and Affect: Mood normal.        Behavior: Behavior normal.        Thought Content: Thought content normal.        Judgment: Judgment normal.         Assessment & Plan:     ICD-10-CM   1. Malignant neoplasm of upper-outer  quadrant of left breast in female, estrogen receptor positive ([place])  C50.412    Z17.0     2. S/P mastectomy, bilateral  Z90.13     3. Malignant neoplasm of female breast, unspecified estrogen receptor status, unspecified laterality, unspecified site of breast ([place])  C50.919     4. Acquired absence of breast and absent nipple, bilateral  Z90.13     The patient is ready to move ahead with reconstruction of the right breast.  She would like to try the expander 1 more time I think that is a reasonable.  If we have any trouble we will have to go with a latissimus muscle flap.  The patient acknowledges understanding this plan.  I will make the request for the following-  Right breast expander and Flex HD placement.  I will need to be prepared to do a wrap as well as placing it under the muscle.  It will depend on her anatomy.

## 2022-06-12 IMAGING — MR MR BREAST BX W LOC DEV 1ST LESION IMAGE BX SPEC MR GUIDE*L*
8 of 12 series · 29 of 48 positions shown · IV contrast (gadavist)
Comparison: Previous exams.
COMPARISON: Previous exams.

Addendum:
CLINICAL DATA: 44-year-old female with newly diagnosed LEFT breast
invasive ductal carcinoma/DCIS and LEFT axillary lymph node
metastasis. For tissue sampling of 2 areas of non masslike
enhancement within the LEFT breast identified on recent breast MR.

EXAM:
MRI GUIDED CORE NEEDLE BIOPSY OF THE LEFT BREAST X 2
TECHNIQUE: Multiplanar, multisequence MR imaging of the LEFT breast was
performed both before and after administration of intravenous
contrast.
CONTRAST:  10mL GADAVIST GADOBUTROL 1 MMOL/ML IV SOLN

[Series 2: fiducial unilateral · sagittal · 2.0mm · 1.33mm/px · 3 of 52 slices shown]
[im 1/52]
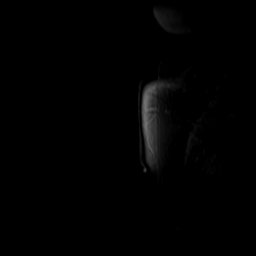
[im 26/52]
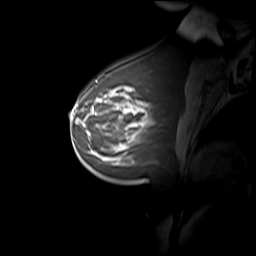
[im 52/52]
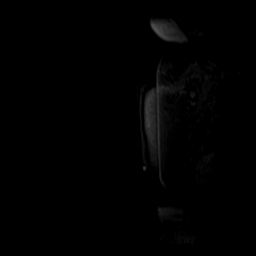

[Series 3: dynamic pre · axial · non-contrast · 1.3mm · 0.73mm/px · z∈[-65,+121]mm · 5 of 144 slices shown]
[im 1/144]
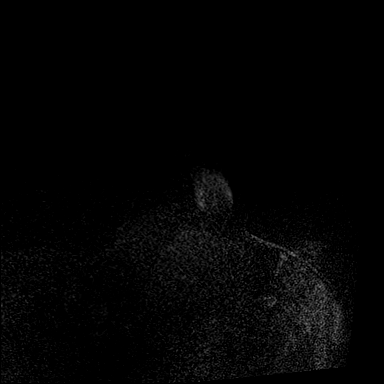
[im 36/144]
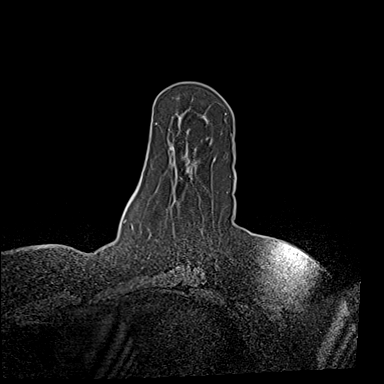
[im 72/144]
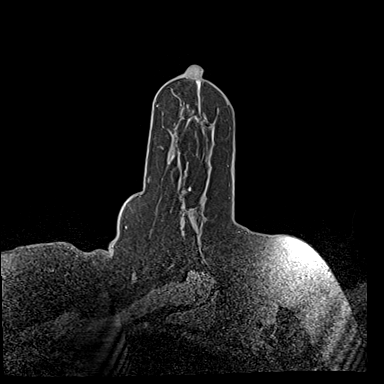
[im 108/144]
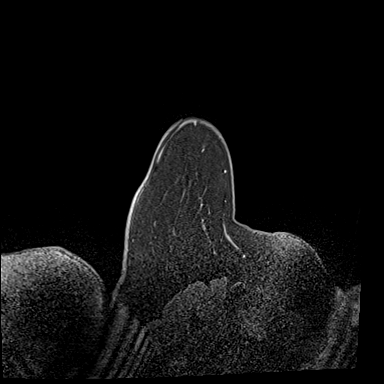
[im 144/144]
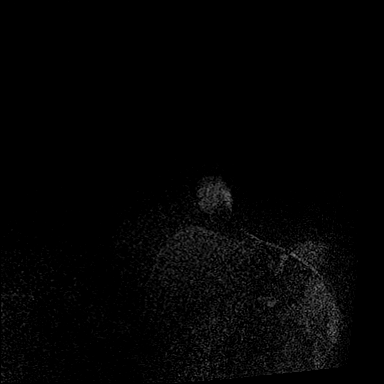

[Series 4: dynamic post 20 · axial · 1.3mm · 0.73mm/px · z∈[-65,+121]mm · 4 of 144 slices shown (1 of 2)]
[im 1/144]
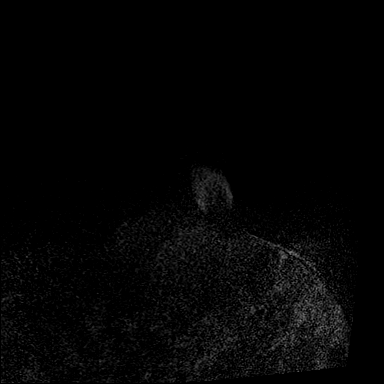
[im 48/144]
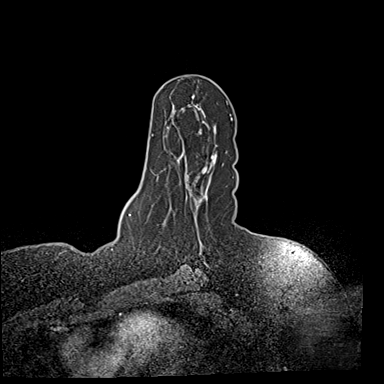
[im 96/144]
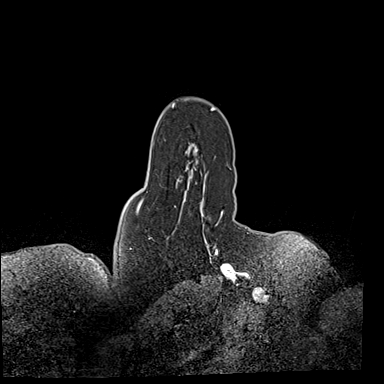
[im 144/144]
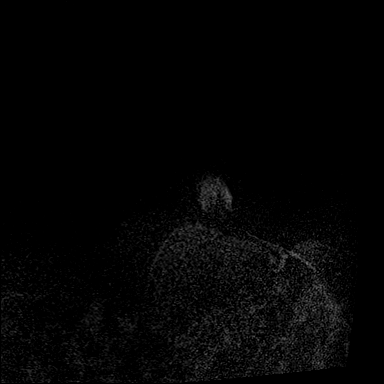

[Series 5: dynamic post 20 · axial · 1.3mm · 0.73mm/px · z∈[-65,+121]mm · 4 of 144 slices shown (2 of 2)]
[im 1/144]
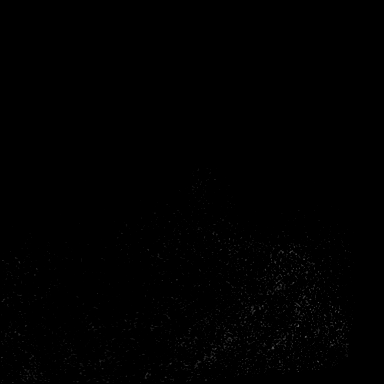
[im 48/144]
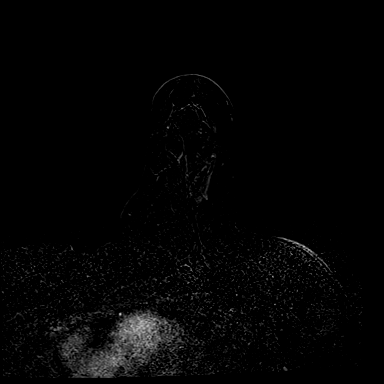
[im 96/144]
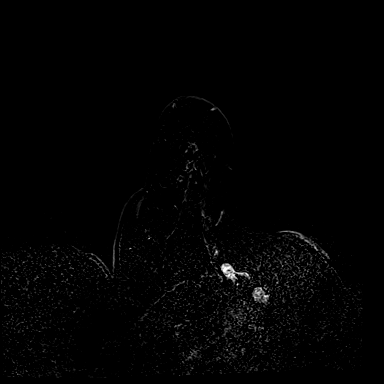
[im 144/144]
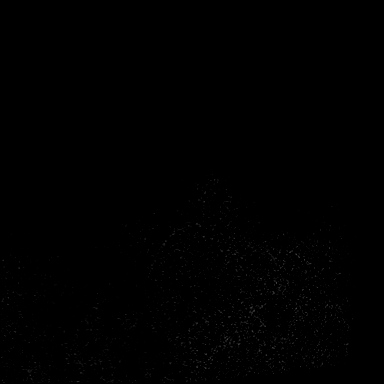

[Series 6: dynamic post 3 · axial · 1.3mm · 0.73mm/px · z∈[-65,+121]mm · 4 of 144 slices shown (1 of 2)]
[im 1/144]
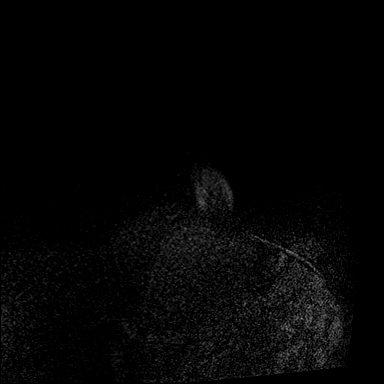
[im 48/144]
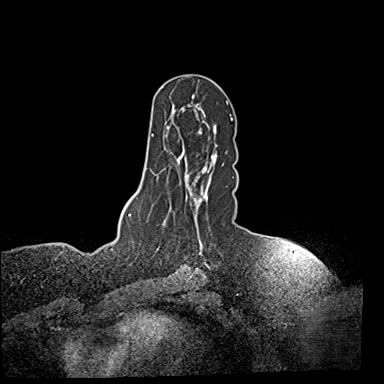
[im 96/144]
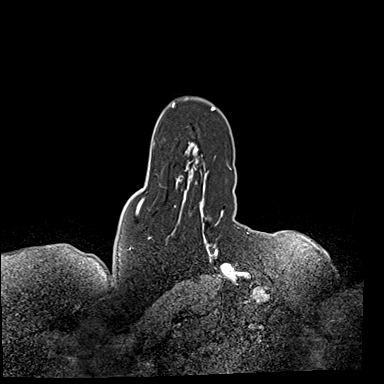
[im 144/144]
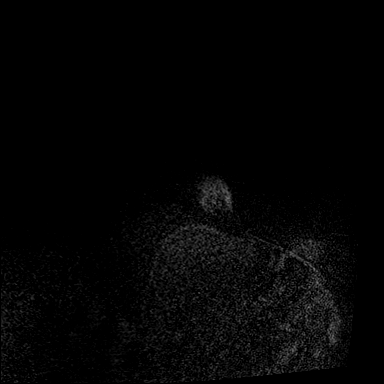

[Series 7: dynamic post 3 · axial · 1.3mm · 0.73mm/px · z∈[-65,+121]mm · 4 of 144 slices shown (2 of 2)]
[im 1/144]
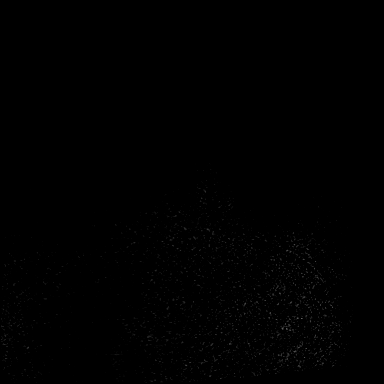
[im 48/144]
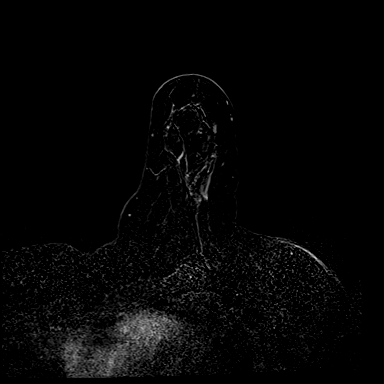
[im 96/144]
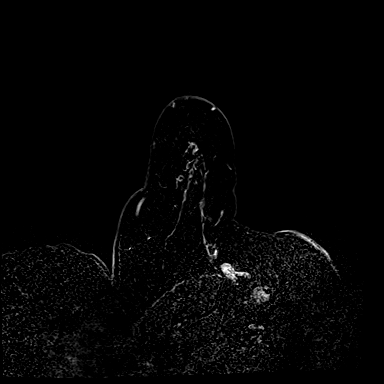
[im 144/144]
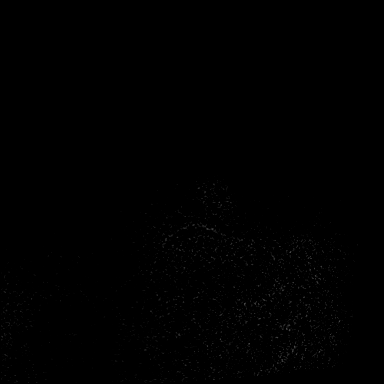

[Series 8: needle confirmation · axial · 1.3mm · 0.73mm/px · z∈[-65,+121]mm · 4 of 144 slices shown]
[im 1/144]
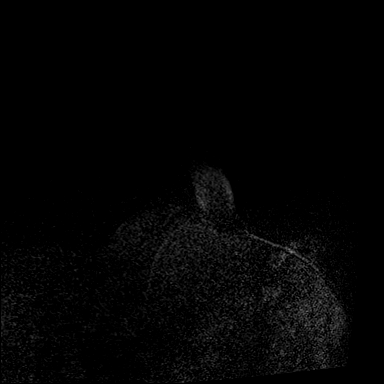
[im 48/144]
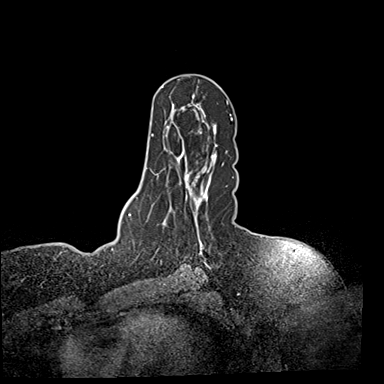
[im 96/144]
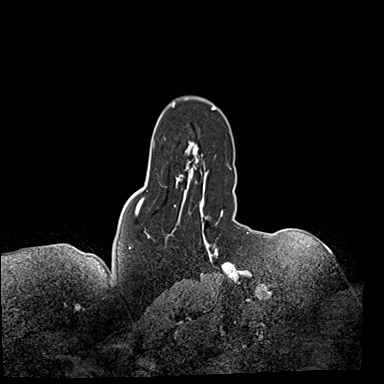
[im 144/144]
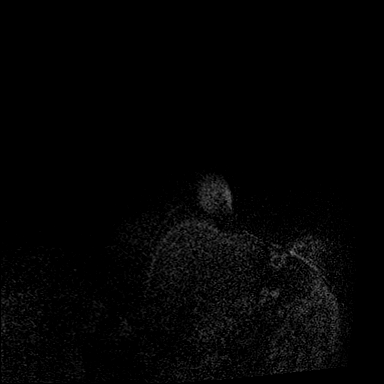

[Series 9: needle confirmation_sub · axial · 1.3mm · 0.73mm/px · 1 of 144 slices shown]
[im 1/144]
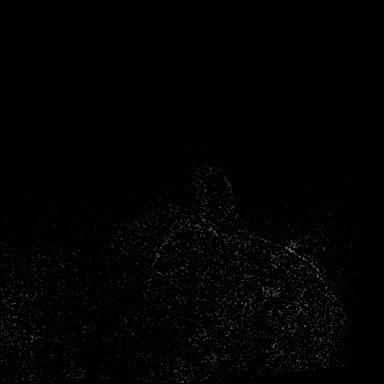

[29 of 48 positions shown; findings below may reference images not displayed]

FINDINGS: I met with the patient, and we discussed the procedure of MRI guided
biopsies, including risks, benefits, and alternatives. Specifically,
we discussed the risks of infection, bleeding, tissue injury, clip
migration, and inadequate sampling. Informed, written consent was
given. The usual time out protocol was performed immediately prior
to the procedures.

The areas of non masslike enhancement identified on 09/30/2020 MR
recommended to be sampled have a different appearance due to
positioning and compression on today study. The areas presumed to be
the same as on the 09/30/2020 MR are targeted.

MR GUIDED CORE NEEDLE BIOPSY OF THE LEFT BREAST #1 (UPPER-OUTER LEFT
breast-BARBELL clip)

Using sterile technique, 1% Lidocaine, MRI guidance, and a 9 gauge
vacuum assisted device, biopsy was performed of the non masslike
enhancement within the UPPER-OUTER LEFT breast (on MR positioning)
using a LATERAL approach. At the conclusion of the procedure, a
BARBELL tissue marker clip was deployed into the biopsy cavity.
Follow-up 2-view mammogram was performed and dictated separately.

MR GUIDED CORE NEEDLE BIOPSY OF THE LEFT BREAST #2 (posterior
central LEFT breast-CYLINDER clip)

Using sterile technique, 1% Lidocaine, MRI guidance, and a 9 gauge
vacuum assisted device, biopsy was performed of the non masslike
enhancement within the posterior central LEFT breast (on MR
positioning) using a LATERAL approach. At the conclusion of the
procedure, a CYLINDER tissue marker clip was deployed into the
biopsy cavity. Follow-up 2-view mammogram was performed and dictated
separately.

Post biopsy images demonstrate a small to moderate hematoma within
the mid-posterior LEFT breast.
IMPRESSION: Two separate MRI guided biopsies of non masslike enhancement within
the LEFT breast.

Small to moderate post biopsy hematoma within the mid-posterior LEFT
breast.

ADDENDUM:
Pathology revealed COMPLEX SCLEROSING LESION WITH ATYPIA of the LEFT
breast, upper outer. This was found to be concordant by Dr. Mario Nebitno Jug,
with excision recommended.

Pathology revealed INTERMEDIATE GRADE DUCTAL CARCINOMA IN SITU of
the LEFT breast, posterior central. This was found to be concordant
by Dr. Mario Nebitno Jug.

Pathology results were discussed with the patient by telephone. The
patient reported doing well after the biopsies with tenderness and
bruising at the sites. Post biopsy instructions and care were
reviewed and questions were answered. The patient was encouraged to
call The [REDACTED] for any additional
concerns. My direct phone number was provided.

The patient has a recent diagnosis of LEFT breast cancer and should
follow her outlined treatment plan.

Dr. Nuris Kassim was notified of biopsy results via [REDACTED] message on
October 07, 2020.

The patient was instructed to return for a bilateral breast MRI in 6
months, per protocol, unless mastectomy is pursued.

Pathology results reported by Bastian Ignacio Monje, RN on 10/07/2020.

*** End of Addendum ***
FINDINGS: I met with the patient, and we discussed the procedure of MRI guided
biopsies, including risks, benefits, and alternatives. Specifically,
we discussed the risks of infection, bleeding, tissue injury, clip
migration, and inadequate sampling. Informed, written consent was
given. The usual time out protocol was performed immediately prior
to the procedures.

The areas of non masslike enhancement identified on 09/30/2020 MR
recommended to be sampled have a different appearance due to
positioning and compression on today study. The areas presumed to be
the same as on the 09/30/2020 MR are targeted.

MR GUIDED CORE NEEDLE BIOPSY OF THE LEFT BREAST #1 (UPPER-OUTER LEFT
breast-BARBELL clip)

Using sterile technique, 1% Lidocaine, MRI guidance, and a 9 gauge
vacuum assisted device, biopsy was performed of the non masslike
enhancement within the UPPER-OUTER LEFT breast (on MR positioning)
using a LATERAL approach. At the conclusion of the procedure, a
BARBELL tissue marker clip was deployed into the biopsy cavity.
Follow-up 2-view mammogram was performed and dictated separately.

MR GUIDED CORE NEEDLE BIOPSY OF THE LEFT BREAST #2 (posterior
central LEFT breast-CYLINDER clip)

Using sterile technique, 1% Lidocaine, MRI guidance, and a 9 gauge
vacuum assisted device, biopsy was performed of the non masslike
enhancement within the posterior central LEFT breast (on MR
positioning) using a LATERAL approach. At the conclusion of the
procedure, a CYLINDER tissue marker clip was deployed into the
biopsy cavity. Follow-up 2-view mammogram was performed and dictated
separately.

Post biopsy images demonstrate a small to moderate hematoma within
the mid-posterior LEFT breast.
IMPRESSION: Two separate MRI guided biopsies of non masslike enhancement within
the LEFT breast.

Small to moderate post biopsy hematoma within the mid-posterior LEFT
breast.

## 2022-06-17 ENCOUNTER — Encounter (INDEPENDENT_AMBULATORY_CARE_PROVIDER_SITE_OTHER): Payer: Self-pay

## 2022-06-17 DIAGNOSIS — E119 Type 2 diabetes mellitus without complications: Secondary | ICD-10-CM | POA: Diagnosis not present

## 2022-06-23 ENCOUNTER — Telehealth: Payer: Self-pay | Admitting: *Deleted

## 2022-06-23 NOTE — Telephone Encounter (Signed)
Auth request submitted via [employer] portal  Prior authorization and predetermination are not a guarantee of benefits.Please contact the benefits department to verify coverage and benefit information for the member. Transaction Submission Confirmation Case ID# [id number] was submitted on 06-23-2022 by [email or web address]  Will update with pending auth once case is updated by [facility]

## 2022-06-25 ENCOUNTER — Other Ambulatory Visit (HOSPITAL_COMMUNITY): Payer: Self-pay

## 2022-06-25 DIAGNOSIS — E669 Obesity, unspecified: Secondary | ICD-10-CM | POA: Diagnosis not present

## 2022-06-25 DIAGNOSIS — F419 Anxiety disorder, unspecified: Secondary | ICD-10-CM | POA: Diagnosis not present

## 2022-06-25 DIAGNOSIS — Z6836 Body mass index (BMI) 36.0-36.9, adult: Secondary | ICD-10-CM | POA: Diagnosis not present

## 2022-06-25 DIAGNOSIS — Z9013 Acquired absence of bilateral breasts and nipples: Secondary | ICD-10-CM | POA: Diagnosis not present

## 2022-06-25 DIAGNOSIS — R632 Polyphagia: Secondary | ICD-10-CM | POA: Diagnosis not present

## 2022-06-25 DIAGNOSIS — K5903 Drug induced constipation: Secondary | ICD-10-CM | POA: Diagnosis not present

## 2022-06-25 MED ORDER — WEGOVY 1 MG/0.5ML ~~LOC~~ SOAJ
1.0000 mg | SUBCUTANEOUS | 2 refills | Status: DC
  Filled 2022-06-25: qty 2, 28d supply, fill #0

## 2022-07-03 ENCOUNTER — Other Ambulatory Visit (HOSPITAL_COMMUNITY): Payer: Self-pay

## 2022-07-03 DIAGNOSIS — L509 Urticaria, unspecified: Secondary | ICD-10-CM | POA: Diagnosis not present

## 2022-07-03 MED ORDER — TRIAMCINOLONE ACETONIDE 0.1 % EX LOTN
1.0000 | TOPICAL_LOTION | Freq: Two times a day (BID) | CUTANEOUS | 0 refills | Status: DC
  Filled 2022-07-03: qty 60, 30d supply, fill #0

## 2022-07-03 MED ORDER — FLUCONAZOLE 150 MG PO TABS
150.0000 mg | ORAL_TABLET | ORAL | 0 refills | Status: DC
  Filled 2022-07-03 – 2022-09-13 (×2): qty 2, 3d supply, fill #0

## 2022-07-03 MED ORDER — CEPHALEXIN 500 MG PO CAPS
500.0000 mg | ORAL_CAPSULE | Freq: Four times a day (QID) | ORAL | 0 refills | Status: DC
  Filled 2022-07-03: qty 20, 5d supply, fill #0

## 2022-07-06 ENCOUNTER — Other Ambulatory Visit: Payer: Self-pay | Admitting: Hematology and Oncology

## 2022-07-06 ENCOUNTER — Other Ambulatory Visit (HOSPITAL_COMMUNITY): Payer: Self-pay

## 2022-07-06 ENCOUNTER — Other Ambulatory Visit: Payer: Self-pay | Admitting: *Deleted

## 2022-07-06 DIAGNOSIS — L309 Dermatitis, unspecified: Secondary | ICD-10-CM | POA: Diagnosis not present

## 2022-07-06 MED ORDER — TAMOXIFEN CITRATE 10 MG PO TABS
10.0000 mg | ORAL_TABLET | Freq: Every day | ORAL | 3 refills | Status: DC
  Filled 2022-07-06: qty 90, 90d supply, fill #0
  Filled 2022-10-17: qty 90, 90d supply, fill #1
  Filled 2023-01-28: qty 90, 90d supply, fill #2
  Filled 2023-05-17: qty 90, 90d supply, fill #3

## 2022-07-07 ENCOUNTER — Other Ambulatory Visit (HOSPITAL_COMMUNITY): Payer: Self-pay

## 2022-07-09 ENCOUNTER — Other Ambulatory Visit (HOSPITAL_COMMUNITY): Payer: Self-pay

## 2022-07-19 ENCOUNTER — Other Ambulatory Visit: Payer: Self-pay | Admitting: Physician Assistant

## 2022-07-20 ENCOUNTER — Other Ambulatory Visit (HOSPITAL_COMMUNITY): Payer: Self-pay

## 2022-07-20 MED ORDER — GABAPENTIN 100 MG PO CAPS
100.0000 mg | ORAL_CAPSULE | Freq: Three times a day (TID) | ORAL | 0 refills | Status: DC
  Filled 2022-07-20: qty 270, 90d supply, fill #0

## 2022-07-23 ENCOUNTER — Telehealth: Payer: Self-pay | Admitting: *Deleted

## 2022-07-23 NOTE — Telephone Encounter (Signed)
LVM to schedule surgery

## 2022-07-25 ENCOUNTER — Encounter (HOSPITAL_COMMUNITY): Payer: Self-pay | Admitting: Emergency Medicine

## 2022-07-25 ENCOUNTER — Emergency Department (HOSPITAL_COMMUNITY)
Admission: EM | Admit: 2022-07-25 | Discharge: 2022-07-25 | Disposition: A | Discharge status: Home / Self Care | Payer: 59 | Attending: Emergency Medicine | Admitting: Emergency Medicine

## 2022-07-25 DIAGNOSIS — T63441A Toxic effect of venom of bees, accidental (unintentional), initial encounter: Secondary | ICD-10-CM | POA: Insufficient documentation

## 2022-07-25 DIAGNOSIS — R9431 Abnormal electrocardiogram [ECG] [EKG]: Secondary | ICD-10-CM | POA: Diagnosis not present

## 2022-07-25 DIAGNOSIS — T63444A Toxic effect of venom of bees, undetermined, initial encounter: Secondary | ICD-10-CM

## 2022-07-25 MED ORDER — PREDNISONE 20 MG PO TABS
60.0000 mg | ORAL_TABLET | Freq: Once | ORAL | Status: AC
  Administered 2022-07-25: 60 mg via ORAL
  Filled 2022-07-25: qty 3

## 2022-07-25 MED ORDER — FAMOTIDINE 20 MG PO TABS: 20.0000 mg | ORAL_TABLET | Freq: Every day | ORAL | 0 refills | Status: DC

## 2022-07-25 MED ORDER — EPINEPHRINE 0.3 MG/0.3ML IJ SOAJ: 0.3000 mg | INTRAMUSCULAR | 0 refills | Status: DC | PRN

## 2022-07-25 MED ORDER — PREDNISONE 50 MG PO TABS: ORAL_TABLET | ORAL | 0 refills | Status: DC

## 2022-07-25 MED ORDER — FAMOTIDINE 20 MG PO TABS
20.0000 mg | ORAL_TABLET | Freq: Once | ORAL | Status: AC
  Administered 2022-07-25: 20 mg via ORAL
  Filled 2022-07-25: qty 1

## 2022-07-25 NOTE — ED Provider Notes (Signed)
[facility] EMERGENCY DEPARTMENT Provider Note   CSN: [id number] Arrival date & time: 07/25/22  1231     History {Add pertinent medical, surgical, social history, OB history to HPI:1} Chief Complaint  Patient presents with   Insect Bite    Jennifer Davis is a 46 y.o. female.  Patient with history of asthma states she was stung by wasp or bee approximately 12 noon today while she was outside.  She began to feel flushed, tight in her throat and short of breath.  She self administered her epinephrine pen at 12:08 PM.  She is feeling better at this time.  She did not waiting for any waiting room for 3 hours prior to my assessment.  She feels her breathing is back to normal.  Denies chest pain.  Her throat feels back to normal.  No difficulty breathing or difficulty swallowing.  No tongue or lip swelling.  No rash other than at the site of the initial sting.  That area is still erythematous and itchy.  She did not take any Benadryl or other medications. No nausea vomiting or abdominal pain.  The history is provided by the patient.       Home Medications Prior to Admission medications   Medication Sig Start Date End Date Taking? Authorizing Provider  albuterol (VENTOLIN HFA) 108 (90 Base) MCG/ACT inhaler Inhale 2 puffs into the lungs every 4-6 hours as needed. 06/02/22     budesonide-formoterol (SYMBICORT) 160-4.5 MCG/ACT inhaler Inhale 2 puffs into the lungs 2 (two) times daily. 01/02/22     cephALEXin (KEFLEX) 500 MG capsule Take 1 capsule (500 mg total) by mouth 4 (four) times daily. 07/03/22     cetirizine (ZYRTEC) 10 MG tablet Take 10 mg by mouth daily.    [provider]  Cholecalciferol (VITAMIN D) 50 MCG (2000 UT) tablet Take 1 tablet (2,000 Units total) by mouth daily. 05/07/22   Addison Lank, PA-C  EPINEPHrine (EPIPEN 2-PAK) 0.3 mg/0.3 mL IJ SOAJ injection Inject 0.3 mLs (0.3 mg total) into the muscle once as needed for up to 1 dose (for severe allergic  reaction). CAll 911 immediately if you have to use this medicine 07/05/19   Margarita Mail, PA-C  escitalopram (LEXAPRO) 20 MG tablet Take 1 tablet (20 mg total) by mouth daily. 05/04/22   Addison Lank, PA-C  fluconazole (DIFLUCAN) 150 MG tablet Take 1 tablet (150 mg total) by mouth now and repeat in 3 days 07/03/22     gabapentin (NEURONTIN) 100 MG capsule Take 1 capsule (100 mg total) by mouth 3 (three) times daily. 07/20/22   Donnal Moat T, PA-C  LORazepam (ATIVAN) 1 MG tablet Take 0.5-1 mg by mouth every 8 (eight) hours as needed for anxiety.    [provider]  MELATONIN PO Take 5 mg by mouth as needed.    [provider]  montelukast (SINGULAIR) 10 MG tablet Take 1 tablet (10 mg total) by mouth every evening. 02/16/22     nystatin ointment (MYCOSTATIN) Apply 1 application externally 2 (two) times daily for 14 days 05/04/22     Probiotic Product ([employer] PROBIOTIC PO) Take by mouth.    [provider]  Semaglutide, 1 MG/DOSE, (OZEMPIC, 1 MG/DOSE,) 4 MG/3ML SOPN Inject 1 mg into the skin once a week. 04/16/22     Semaglutide-Weight Management (WEGOVY) 1 MG/0.5ML SOAJ Inject 1 mg into the skin once a week. 06/25/22     tamoxifen (NOLVADEX) 10 MG tablet Take 1 tablet (  10 mg total) by mouth daily. 07/06/22   Nicholas Lose, MD  triamcinolone lotion (KENALOG) 0.1 % Apply 1 Application topically 2 (two) times daily for 10 days. 07/03/22     Investigational olanzapine/placebo 10 MG capsule URCC 16070 Blister Card 2 Take 1 capsule by mouth daily. Take in the morning on Days 2-4. 02/05/21 02/27/21  Nicholas Lose, MD      Allergies    Bee venom and Other    Review of Systems   Review of Systems  Constitutional:  Negative for activity change, appetite change and fever.  Respiratory:  Positive for shortness of breath. Negative for chest tightness.   Skin:  Positive for rash.   all other systems are negative except as noted in the HPI and PMH.    Physical Exam Updated Vital  Signs BP (!) 166/113 (BP Location: Right Arm)   Pulse 87   Temp 98.1 F (36.7 C) (Oral)   Resp 17   SpO2 100%  Physical Exam Vitals and nursing note reviewed.  Constitutional:      General: She is not in acute distress.    Appearance: She is well-developed. She is not ill-appearing.     Comments: Speaking in full sentences no distress  HENT:     Head: Normocephalic and atraumatic.     Mouth/Throat:     Pharynx: No oropharyngeal exudate.     Comments: No tongue or lip swelling Eyes:     Conjunctiva/sclera: Conjunctivae normal.     Pupils: Pupils are equal, round, and reactive to light.  Neck:     Comments: No meningismus. Cardiovascular:     Rate and Rhythm: Normal rate and regular rhythm.     Heart sounds: Normal heart sounds. No murmur heard. Pulmonary:     Effort: Pulmonary effort is normal. No respiratory distress.     Breath sounds: Normal breath sounds. No wheezing.  Abdominal:     Palpations: Abdomen is soft.     Tenderness: There is no abdominal tenderness. There is no guarding or rebound.  Musculoskeletal:        General: No tenderness. Normal range of motion.     Cervical back: Normal range of motion and neck supple.  Skin:    General: Skin is warm.     Findings: Rash present.     Comments: Splotchy erythema right lateral buttock  Neurological:     Mental Status: She is alert and oriented to person, place, and time.     Cranial Nerves: No cranial nerve deficit.     Motor: No abnormal muscle tone.     Coordination: Coordination normal.     Comments:  5/5 strength throughout. CN 2-12 intact.Equal grip strength.   Psychiatric:        Behavior: Behavior normal.     ED Results / Procedures / Treatments   Labs (all labs ordered are listed, but only abnormal results are displayed) Labs Reviewed - No data to display  EKG None  Radiology No results found.  Procedures Procedures  {Document cardiac monitor, telemetry assessment procedure when  appropriate:1}  Medications Ordered in ED Medications  predniSONE (DELTASONE) tablet 60 mg (has no administration in time range)  famotidine (PEPCID) tablet 20 mg (has no administration in time range)    ED Course/ Medical Decision Making/ A&P                           Medical Decision Making Amount and/or Complexity of  Data Reviewed Labs: ordered. Decision-making details documented in ED Course. Radiology: ordered and independent interpretation performed. Decision-making details documented in ED Course. ECG/medicine tests: ordered and independent interpretation performed. Decision-making details documented in ED Course.  Risk Prescription drug management.  Bee sting status post self-administered epinephrine at 12:08 PM.  Patient feels back to baseline now.  No hypoxia or increased work of breathing.  No tongue or lip swelling.  No chest pain.  Patient given p.o. prednisone as well as antihistamines.  She will be observed in the ED for a total of 4 hours since her epinephrine injection.  She has no increased work of breathing, hypoxia, wheezing, shortness of breath, nausea or vomiting.  Patient observed in the ED 4 hours status post epinephrine injection.  She is back to baseline.  Reports her throat feels normal.  No chest pain or shortness of breath.  Discussed p.o. antihistamines and steroids for the next 4 days.  Epinephrine pen to be refilled.  Instructions given.  Return precautions discussed  {Document critical care time when appropriate:1} {Document review of labs and clinical decision tools ie heart score, Chads2Vasc2 etc:1}  {Document your independent review of radiology images, and any outside records:1} {Document your discussion with family members, caretakers, and with consultants:1} {Document social determinants of health affecting pt's care:1} {Document your decision making why or why not admission, treatments were needed:1} Final Clinical Impression(s) / ED  Diagnoses Final diagnoses:  None    Rx / DC Orders ED Discharge Orders     None

## 2022-07-25 NOTE — Discharge Instructions (Addendum)
Take the steroids and antihistamines as prescribed.  Follow-up with your primary doctor next week.  If you use the epinephrine pen he must come to the hospital to monitor afterwards.  Return to the ED with difficulty breathing, chest pain, shortness of breath, tongue swelling, lip swelling, any other concerns

## 2022-07-25 NOTE — ED Triage Notes (Addendum)
Patient here with complaint of a bee sting that occurred at approximately 1208 today. Self administered epi pen. No swelling, no shortness of breath, patient is alert, oriented, and in no apparent distress at this time.

## 2022-08-02 ENCOUNTER — Other Ambulatory Visit: Payer: Self-pay | Admitting: Physician Assistant

## 2022-08-03 ENCOUNTER — Other Ambulatory Visit (HOSPITAL_COMMUNITY): Payer: Self-pay

## 2022-08-03 MED ORDER — ESCITALOPRAM OXALATE 20 MG PO TABS
20.0000 mg | ORAL_TABLET | Freq: Every day | ORAL | 0 refills | Status: DC
  Filled 2022-08-03: qty 90, 90d supply, fill #0

## 2022-08-04 ENCOUNTER — Other Ambulatory Visit (HOSPITAL_COMMUNITY): Payer: Self-pay

## 2022-08-04 DIAGNOSIS — Z6835 Body mass index (BMI) 35.0-35.9, adult: Secondary | ICD-10-CM | POA: Diagnosis not present

## 2022-08-04 DIAGNOSIS — Z9010 Acquired absence of unspecified breast and nipple: Secondary | ICD-10-CM | POA: Diagnosis not present

## 2022-08-04 DIAGNOSIS — R632 Polyphagia: Secondary | ICD-10-CM | POA: Diagnosis not present

## 2022-08-04 DIAGNOSIS — J9801 Acute bronchospasm: Secondary | ICD-10-CM | POA: Diagnosis not present

## 2022-08-04 MED ORDER — WEGOVY 1 MG/0.5ML ~~LOC~~ SOAJ
0.5000 mL | SUBCUTANEOUS | 2 refills | Status: DC
  Filled 2022-08-04 – 2022-08-22 (×2): qty 2, 28d supply, fill #0

## 2022-08-04 MED ORDER — PREDNISONE 5 MG PO TABS
ORAL_TABLET | ORAL | 0 refills | Status: AC
  Filled 2022-08-04: qty 21, 6d supply, fill #0

## 2022-08-11 ENCOUNTER — Other Ambulatory Visit (HOSPITAL_COMMUNITY): Payer: Self-pay

## 2022-08-11 DIAGNOSIS — J301 Allergic rhinitis due to pollen: Secondary | ICD-10-CM | POA: Diagnosis not present

## 2022-08-11 DIAGNOSIS — J3089 Other allergic rhinitis: Secondary | ICD-10-CM | POA: Diagnosis not present

## 2022-08-11 DIAGNOSIS — J3081 Allergic rhinitis due to animal (cat) (dog) hair and dander: Secondary | ICD-10-CM | POA: Diagnosis not present

## 2022-08-11 DIAGNOSIS — J4540 Moderate persistent asthma, uncomplicated: Secondary | ICD-10-CM | POA: Diagnosis not present

## 2022-08-11 DIAGNOSIS — J45998 Other asthma: Secondary | ICD-10-CM | POA: Diagnosis not present

## 2022-08-11 DIAGNOSIS — K219 Gastro-esophageal reflux disease without esophagitis: Secondary | ICD-10-CM | POA: Diagnosis not present

## 2022-08-11 MED ORDER — BUDESONIDE-FORMOTEROL FUMARATE 160-4.5 MCG/ACT IN AERO
2.0000 | INHALATION_SPRAY | Freq: Two times a day (BID) | RESPIRATORY_TRACT | 5 refills | Status: DC
  Filled 2022-08-11: qty 10.2, 30d supply, fill #0

## 2022-08-11 MED ORDER — ALBUTEROL SULFATE HFA 108 (90 BASE) MCG/ACT IN AERS
2.0000 | INHALATION_SPRAY | RESPIRATORY_TRACT | 0 refills | Status: DC | PRN
  Filled 2022-08-11: qty 6.7, 14d supply, fill #0

## 2022-08-11 MED ORDER — OLOPATADINE HCL 0.6 % NA SOLN
2.0000 | Freq: Two times a day (BID) | NASAL | 5 refills | Status: DC
  Filled 2022-08-11: qty 30.5, 30d supply, fill #0
  Filled 2022-10-17: qty 30.5, 30d supply, fill #1

## 2022-08-11 MED ORDER — EPINEPHRINE 0.3 MG/0.3ML IJ SOAJ
INTRAMUSCULAR | 1 refills | Status: DC
  Filled 2022-08-11: qty 2, 15d supply, fill #0

## 2022-08-11 MED ORDER — MONTELUKAST SODIUM 10 MG PO TABS
10.0000 mg | ORAL_TABLET | Freq: Every evening | ORAL | 5 refills | Status: DC
  Filled 2022-08-11 – 2022-09-27 (×2): qty 30, 30d supply, fill #0
  Filled 2022-10-27: qty 30, 30d supply, fill #1
  Filled 2022-12-03: qty 30, 30d supply, fill #2
  Filled 2023-01-08: qty 30, 30d supply, fill #3
  Filled 2023-02-22: qty 30, 30d supply, fill #4
  Filled 2023-03-17: qty 30, 30d supply, fill #5

## 2022-08-18 ENCOUNTER — Telehealth: Payer: Self-pay | Admitting: Physician Assistant

## 2022-08-18 NOTE — Telephone Encounter (Signed)
I spoke with Jennifer Davis, she is ready to schedule surgery. Will have surgery scheduler reach out to her for next steps.

## 2022-08-24 ENCOUNTER — Other Ambulatory Visit (HOSPITAL_COMMUNITY): Payer: Self-pay

## 2022-08-27 NOTE — Progress Notes (Deleted)
Referring Provider Wenda Low, MD [facility] [address] [place],  [place] [address]   CC: No chief complaint on file.     Jennifer Davis is an 46 y.o. female.  HPI: Patient is a 46 year old female with PMH of left-sided breast cancer s/p bilateral mastectomy with implant-based reconstruction who presents to clinic for reconstruction follow-up.  Her reconstruction was complicated by right-sided seroma and expander problems leading to washout and replacement.  However, she continued to experience problems with the right side expander and was ultimately removed May 2022.  Plan was for follow-up in 6 months to talk about next steps for right-sided reconstruction, likely will require latissimus or TRAM flap.     Allergies  Allergen Reactions   Bee Venom Hives    wheezy   Other Itching    environmental allergies sometime activate asthma    Outpatient Encounter Medications as of 08/28/2022  Medication Sig   albuterol (PROAIR HFA) 108 (90 Base) MCG/ACT inhaler Inhale 2 puffs into the lungs every 4-6 hours as needed.   albuterol (VENTOLIN HFA) 108 (90 Base) MCG/ACT inhaler Inhale 2 puffs into the lungs every 4-6 hours as needed.   budesonide-formoterol (SYMBICORT) 160-4.5 MCG/ACT inhaler Inhale 2 puffs into the lungs 2 (two) times daily.   budesonide-formoterol (SYMBICORT) 160-4.5 MCG/ACT inhaler Inhale 2 puffs into the lungs 2 (two) times daily.   cephALEXin (KEFLEX) 500 MG capsule Take 1 capsule (500 mg total) by mouth 4 (four) times daily.   cetirizine (ZYRTEC) 10 MG tablet Take 10 mg by mouth daily.   Cholecalciferol (VITAMIN D) 50 MCG (2000 UT) tablet Take 1 tablet (2,000 Units total) by mouth daily.   EPINEPHrine (EPIPEN 2-PAK) 0.3 mg/0.3 mL IJ SOAJ injection Inject 0.3 mLs (0.3 mg total) into the muscle once as needed for up to 1 dose (for severe allergic reaction). CAll 911 immediately if you have to use this medicine   EPINEPHrine 0.3 mg/0.3 mL IJ SOAJ injection Inject  0.3 mg into the muscle as needed for anaphylaxis.   EPINEPHrine 0.3 mg/0.3 mL IJ SOAJ injection Use as directed for anaphylaxis   escitalopram (LEXAPRO) 20 MG tablet Take 1 tablet (20 mg total) by mouth daily.   famotidine (PEPCID) 20 MG tablet Take 1 tablet (20 mg total) by mouth daily.   fluconazole (DIFLUCAN) 150 MG tablet Take 1 tablet (150 mg total) by mouth now and repeat in 3 days   gabapentin (NEURONTIN) 100 MG capsule Take 1 capsule (100 mg total) by mouth 3 (three) times daily.   LORazepam (ATIVAN) 1 MG tablet Take 0.5-1 mg by mouth every 8 (eight) hours as needed for anxiety.   MELATONIN PO Take 5 mg by mouth as needed.   montelukast (SINGULAIR) 10 MG tablet Take 1 tablet (10 mg total) by mouth every evening.   montelukast (SINGULAIR) 10 MG tablet Take 1 tablet (10 mg total) by mouth every evening.   nystatin ointment (MYCOSTATIN) Apply 1 application externally 2 (two) times daily for 14 days   Olopatadine HCl 0.6 % SOLN Place 2 sprays into both nostrils 2 (two) times daily.   predniSONE (DELTASONE) 50 MG tablet 1 tablet PO daily   Probiotic Product ([employer] PROBIOTIC PO) Take by mouth.   Semaglutide, 1 MG/DOSE, (OZEMPIC, 1 MG/DOSE,) 4 MG/3ML SOPN Inject 1 mg into the skin once a week.   Semaglutide-Weight Management (WEGOVY) 1 MG/0.5ML SOAJ Inject 1 mg into the skin once a week.   Semaglutide-Weight Management (WEGOVY) 1 MG/0.5ML SOAJ Inject  1 mg into the skin once a week.   tamoxifen (NOLVADEX) 10 MG tablet Take 1 tablet (10 mg total) by mouth daily.   triamcinolone lotion (KENALOG) 0.1 % Apply 1 Application topically 2 (two) times daily for 10 days.   [DISCONTINUED] Investigational olanzapine/placebo 10 MG capsule URCC 16070 Blister Card 2 Take 1 capsule by mouth daily. Take in the morning on Days 2-4.   No facility-administered encounter medications on file as of 08/28/2022.     Past Medical History:  Diagnosis Date   Anxiety    Asthma    Asthma due to seasonal allergies     Back pain    Breast cancer (HCC)    Constipation    Depression    Dyspnea    after chemo - fatiqued   Fluid retention    GERD (gastroesophageal reflux disease)    occasionl   History of radiation therapy    left chest wall and subclavian 07/09/2021-08/22/2021   Dr Gery Pray   Joint pain    Lower extremity edema    Morbid obesity ([facility])    Palpitations 2017   tachy    Palpitations 01/09/2016   Prediabetes    PTSD (post-traumatic stress disorder)    SOB (shortness of breath)    Swallowing difficulty     Past Surgical History:  Procedure Laterality Date   BREAST RECONSTRUCTION WITH PLACEMENT OF TISSUE EXPANDER AND FLEX HD (ACELLULAR HYDRATED DERMIS) Bilateral 12/04/2020   Procedure: IMMEDIATE BILATERAL BREAST RECONSTRUCTION WITH PLACEMENT OF TISSUE EXPANDER AND FLEX HD (ACELLULAR HYDRATED DERMIS);  Surgeon: Wallace Going, DO;  Location: [facility];  Service: Plastics;  Laterality: Bilateral;   CESAREAN SECTION     x3   DEBRIDEMENT AND CLOSURE WOUND Right 04/10/2021   Procedure: excision of right breast wound with closure;  Surgeon: Wallace Going, DO;  Location: [facility];  Service: Plastics;  Laterality: Right;   MASTECTOMY WITH AXILLARY LYMPH NODE DISSECTION Left 12/04/2020   Procedure: BILATERAL MASTECTOMIES, RADIOACTIVE SEED GUIDED TARGETED LEFT AXILLARY NODE DISSECTION;  Surgeon: Coralie Keens, MD;  Location: [facility];  Service: General;  Laterality: Left;   PORT-A-CATH REMOVAL Left 08/27/2021   Procedure: REMOVAL PORT-A-CATH;  Surgeon: Coralie Keens, MD;  Location: [facility];  Service: General;  Laterality: Left;   PORTACATH PLACEMENT Left 12/04/2020   Procedure: INSERTION PORT-A-CATH LEFT SUBCLAVIAN;  Surgeon: Coralie Keens, MD;  Location: [facility];  Service: General;  Laterality: Left;   REMOVAL OF TISSUE EXPANDER AND PLACEMENT OF IMPLANT Right 01/04/2021   Procedure:  REMOVAL OF TISSUE EXPANDER AND PLACEMENT OF NEW EXPANDER;  Surgeon: Wallace Going, DO;  Location: [facility];  Service: Plastics;  Laterality: Right;   TISSUE EXPANDER PLACEMENT Right 03/19/2021   Procedure: Removal of right tissue expander;  Surgeon: Wallace Going, DO;  Location: [facility];  Service: Plastics;  Laterality: Right;  68 min    Family History  Problem Relation Age of Onset   Cancer Mother    Thyroid disease Mother    Hyperlipidemia Mother    Hypertension Mother    Fibromyalgia Mother    Colon cancer Mother 9   Depression Mother    Anxiety disorder Mother    Obesity Mother    Asthma Sister        x2   Hypertension Maternal Grandmother    Hypertension Maternal Grandfather    Cancer - Lung Maternal Grandfather    Stroke Maternal Grandfather  Yves Dill Parkinson White syndrome Maternal Aunt 23   Colon cancer Other        MGM's niece, dx unknown age   Colon cancer Other        MGM's nephew; dx unknown age   Breast cancer Neg Hx     Social History   Social History Narrative   epworth sleepiness scale = 10 (01/09/2016)     Review of Systems General: Denies fevers or chills Cardio: Denies chest pain Pulmonary: Denies difficulty breathing  Physical Exam    07/25/2022    5:26 PM 07/25/2022    5:15 PM 07/25/2022    4:45 PM  Vitals with BMI  Systolic  562 130  Diastolic  865 784  Pulse 84 79 79    General:  No acute distress, nontoxic appearing  Respiratory: No increased work of breathing Neuro: Alert and oriented Psychiatric: Normal mood and affect   Assessment/Plan ***  Krista Blue 08/27/2022, 5:06 PM

## 2022-08-28 ENCOUNTER — Ambulatory Visit (INDEPENDENT_AMBULATORY_CARE_PROVIDER_SITE_OTHER): Payer: 59 | Admitting: Student

## 2022-08-28 ENCOUNTER — Encounter: Payer: Self-pay | Admitting: Student

## 2022-08-28 DIAGNOSIS — Z853 Personal history of malignant neoplasm of breast: Secondary | ICD-10-CM | POA: Diagnosis not present

## 2022-08-28 DIAGNOSIS — Z9013 Acquired absence of bilateral breasts and nipples: Secondary | ICD-10-CM | POA: Diagnosis not present

## 2022-08-28 NOTE — Progress Notes (Signed)
Referring Provider Wenda Low, MD [address] [place],  [place] [address]   CC: No chief complaint on file.     Jennifer Davis is an 46 y.o. female.  HPI: Patient is a 46 year old female with PMH of left-sided breast cancer s/p bilateral mastectomy with implant-based reconstruction who presents to clinic for reconstruction follow-up.  Her reconstruction was complicated by right-sided seroma and expander problems leading to washout and replacement.  However, she continued to experience problems with the right side expander and was ultimately removed May 2022.    Patient was last seen in the clinic by Dr. Marla Roe on 06/02/2022.  At this visit, the patient reported that she is ready to move ahead with reconstruction of the right breast.  Per previous note, the left expander has 350/535 cc in it.  Patient reported that she wanted to try the expander 1 more time.  It was discussed with the patient that if there is any trouble, she would have to get a latissimus flap.  Today, patient reports she is doing well.  She states that she has been working on losing weight and is motivated to continue to do so.  Patient states that she woke up two days ago and felt like her expander to the left breast deflated.  She states it does not feel as prominent as it did before.  She denies any specific traumas to the area or any specific event that preceded the expander feeling deflated.  Patient reports she had radiation to the left breast which was completed last October.  She denies any new redness or changes overlying the left breast.  Patient states that she still wants to move forward with reconstruction to the right breast as discussed before.   Allergies  Allergen Reactions   Bee Venom Hives    wheezy   Other Itching    environmental allergies sometime activate asthma    Outpatient Encounter Medications as of 08/28/2022  Medication Sig   albuterol (PROAIR HFA) 108 (90 Base)  MCG/ACT inhaler Inhale 2 puffs into the lungs every 4-6 hours as needed.   albuterol (VENTOLIN HFA) 108 (90 Base) MCG/ACT inhaler Inhale 2 puffs into the lungs every 4-6 hours as needed.   budesonide-formoterol (SYMBICORT) 160-4.5 MCG/ACT inhaler Inhale 2 puffs into the lungs 2 (two) times daily.   budesonide-formoterol (SYMBICORT) 160-4.5 MCG/ACT inhaler Inhale 2 puffs into the lungs 2 (two) times daily.   cephALEXin (KEFLEX) 500 MG capsule Take 1 capsule (500 mg total) by mouth 4 (four) times daily.   cetirizine (ZYRTEC) 10 MG tablet Take 10 mg by mouth daily.   Cholecalciferol (VITAMIN D) 50 MCG (2000 UT) tablet Take 1 tablet (2,000 Units total) by mouth daily.   EPINEPHrine (EPIPEN 2-PAK) 0.3 mg/0.3 mL IJ SOAJ injection Inject 0.3 mLs (0.3 mg total) into the muscle once as needed for up to 1 dose (for severe allergic reaction). CAll 911 immediately if you have to use this medicine   EPINEPHrine 0.3 mg/0.3 mL IJ SOAJ injection Inject 0.3 mg into the muscle as needed for anaphylaxis.   EPINEPHrine 0.3 mg/0.3 mL IJ SOAJ injection Use as directed for anaphylaxis   escitalopram (LEXAPRO) 20 MG tablet Take 1 tablet (20 mg total) by mouth daily.   famotidine (PEPCID) 20 MG tablet Take 1 tablet (20 mg total) by mouth daily.   fluconazole (DIFLUCAN) 150 MG tablet Take 1 tablet (150 mg total) by mouth now and repeat in 3 days   gabapentin (  NEURONTIN) 100 MG capsule Take 1 capsule (100 mg total) by mouth 3 (three) times daily.   LORazepam (ATIVAN) 1 MG tablet Take 0.5-1 mg by mouth every 8 (eight) hours as needed for anxiety.   MELATONIN PO Take 5 mg by mouth as needed.   montelukast (SINGULAIR) 10 MG tablet Take 1 tablet (10 mg total) by mouth every evening.   montelukast (SINGULAIR) 10 MG tablet Take 1 tablet (10 mg total) by mouth every evening.   nystatin ointment (MYCOSTATIN) Apply 1 application externally 2 (two) times daily for 14 days   Olopatadine HCl 0.6 % SOLN Place 2 sprays into both nostrils  2 (two) times daily.   predniSONE (DELTASONE) 50 MG tablet 1 tablet PO daily   Probiotic Product ([employer] PROBIOTIC PO) Take by mouth.   Semaglutide, 1 MG/DOSE, (OZEMPIC, 1 MG/DOSE,) 4 MG/3ML SOPN Inject 1 mg into the skin once a week.   Semaglutide-Weight Management (WEGOVY) 1 MG/0.5ML SOAJ Inject 1 mg into the skin once a week.   Semaglutide-Weight Management (WEGOVY) 1 MG/0.5ML SOAJ Inject 1 mg into the skin once a week.   tamoxifen (NOLVADEX) 10 MG tablet Take 1 tablet (10 mg total) by mouth daily.   triamcinolone lotion (KENALOG) 0.1 % Apply 1 Application topically 2 (two) times daily for 10 days.   [DISCONTINUED] Investigational olanzapine/placebo 10 MG capsule URCC 16070 Blister Card 2 Take 1 capsule by mouth daily. Take in the morning on Days 2-4.   No facility-administered encounter medications on file as of 08/28/2022.     Past Medical History:  Diagnosis Date   Anxiety    Asthma    Asthma due to seasonal allergies    Back pain    Breast cancer (HCC)    Constipation    Depression    Dyspnea    after chemo - fatiqued   Fluid retention    GERD (gastroesophageal reflux disease)    occasionl   History of radiation therapy    left chest wall and subclavian 07/09/2021-08/22/2021   Dr Gery Pray   Joint pain    Lower extremity edema    Morbid obesity ([place])    Palpitations 2017   tachy    Palpitations 01/09/2016   Prediabetes    PTSD (post-traumatic stress disorder)    SOB (shortness of breath)    Swallowing difficulty     Past Surgical History:  Procedure Laterality Date   BREAST RECONSTRUCTION WITH PLACEMENT OF TISSUE EXPANDER AND FLEX HD (ACELLULAR HYDRATED DERMIS) Bilateral 12/04/2020   Procedure: IMMEDIATE BILATERAL BREAST RECONSTRUCTION WITH PLACEMENT OF TISSUE EXPANDER AND FLEX HD (ACELLULAR HYDRATED DERMIS);  Surgeon: Wallace Going, DO;  Location: [facility];  Service: Plastics;  Laterality: Bilateral;   CESAREAN SECTION     x3   DEBRIDEMENT  AND CLOSURE WOUND Right 04/10/2021   Procedure: excision of right breast wound with closure;  Surgeon: Wallace Going, DO;  Location: [place];  Service: Plastics;  Laterality: Right;   MASTECTOMY WITH AXILLARY LYMPH NODE DISSECTION Left 12/04/2020   Procedure: BILATERAL MASTECTOMIES, RADIOACTIVE SEED GUIDED TARGETED LEFT AXILLARY NODE DISSECTION;  Surgeon: Coralie Keens, MD;  Location: [facility];  Service: General;  Laterality: Left;   PORT-A-CATH REMOVAL Left 08/27/2021   Procedure: REMOVAL PORT-A-CATH;  Surgeon: Coralie Keens, MD;  Location: [facility];  Service: General;  Laterality: Left;   PORTACATH PLACEMENT Left 12/04/2020   Procedure: INSERTION PORT-A-CATH LEFT SUBCLAVIAN;  Surgeon: Coralie Keens, MD;  Location:  [facility];  Service: General;  Laterality: Left;   REMOVAL OF TISSUE EXPANDER AND PLACEMENT OF IMPLANT Right 01/04/2021   Procedure: REMOVAL OF TISSUE EXPANDER AND PLACEMENT OF NEW EXPANDER;  Surgeon: Wallace Going, DO;  Location: [facility];  Service: Plastics;  Laterality: Right;   TISSUE EXPANDER PLACEMENT Right 03/19/2021   Procedure: Removal of right tissue expander;  Surgeon: Wallace Going, DO;  Location: [facility];  Service: Plastics;  Laterality: Right;  86 min    Family History  Problem Relation Age of Onset   Cancer Mother    Thyroid disease Mother    Hyperlipidemia Mother    Hypertension Mother    Fibromyalgia Mother    Colon cancer Mother 34   Depression Mother    Anxiety disorder Mother    Obesity Mother    Asthma Sister        x2   Hypertension Maternal Grandmother    Hypertension Maternal Grandfather    Cancer - Lung Maternal Grandfather    Stroke Maternal Grandfather    Yves Dill Parkinson White syndrome Maternal Aunt 50   Colon cancer Other        MGM's niece, dx unknown age   Colon cancer Other        MGM's nephew; dx unknown age   Breast cancer Neg Hx     Social  History   Social History Narrative   epworth sleepiness scale = 10 (01/09/2016)     Review of Systems General: Denies fevers or chills Cardio: Denies chest pain Pulmonary: Denies difficulty breathing  Physical Exam    07/25/2022    5:26 PM 07/25/2022    5:15 PM 07/25/2022    4:45 PM  Vitals with BMI  Systolic  818 563  Diastolic  149 702  Pulse 84 79 79    General:  No acute distress, nontoxic appearing  Respiratory: No increased work of breathing Breasts: Right breast incision site appears to be healed.  There does appear to be a little bit of scarring underneath the skin near the incision site.  There is no overlying erythema, swelling or drainage.  The left breast expander is in place, but does not appear to have fluid in it upon palpation.  There is a little bit of irritation to the skin from radiation, but there are no signs of infection on exam. Neuro: Alert and oriented Psychiatric: Normal mood and affect   We placed injectable saline in the Expander using a sterile technique: Left: 300 cc was placed into the left expander  Assessment/Plan  Acquired absence of both breasts   Dr. Marla Roe had the opportunity to examine the patient and discussed the plan with her.  We discussed with the patient that it appears her expander has leaked.  We discussed with her that she will need to get this taken out and replaced with a new expander within the next week or 2.  Plan will be to move forward with the reconstruction to the right side with expander placement as well.  Patient expressed understanding and was in agreement with the plan.  We discussed with the patient that she will either need to come back next Tuesday if the expander has deflated again since today or if it is still holding fluid she can come back next Friday for another 200 to 300 cc fill so that the tissue to the left breast stays expanded.  Patient expressed understanding.  We discussed with the patient in the  meantime  to use caution with her activities and avoid any strenuous activities.  Patient expressed understanding.  I instructed the patient to call back in the meantime if she has any questions or concerns.  Patient to follow-up next week for another expander fill.   Clance Boll 08/28/2022, 9:17 AM

## 2022-09-01 ENCOUNTER — Ambulatory Visit (INDEPENDENT_AMBULATORY_CARE_PROVIDER_SITE_OTHER): Payer: 59 | Admitting: Student

## 2022-09-01 DIAGNOSIS — Z9013 Acquired absence of bilateral breasts and nipples: Secondary | ICD-10-CM

## 2022-09-01 NOTE — Progress Notes (Signed)
   Referring Provider Jennifer Low, MD [place] [facility] [address] [place],  [place] [address]   CC: No chief complaint on file.     Jennifer Davis is an 46 y.o. female.  HPI: Patient is a 46 year old female with PMH of left-sided breast cancer s/p bilateral mastectomy with implant-based reconstruction who presents to clinic for reconstruction follow-up.   Her reconstruction was complicated by right-sided seroma and expander problems leading to washout and replacement.  However, she continued to experience problems with the right side expander and was ultimately removed May 2022.    Today, patient is accompanied by her husband at bedside.  She reports she is doing well today.  She states that the left breast expander has deflated again.  She states that it was a gradual decrease over the past few days.  She denies any other issues or complaints at this time.  She denies any fevers or chills.   Review of Systems General: Denies any changes since her most recent visit.  Physical Exam    07/25/2022    5:26 PM 07/25/2022    5:15 PM 07/25/2022    4:45 PM  Vitals with BMI  Systolic  546 503  Diastolic  546 568  Pulse 84 79 79    General:  No acute distress,  Alert and oriented, Non-Toxic, Normal speech and affect Left breast expander appears to have significant volume loss similar to her previous exam.  There is no overlying erythema.  There are no significant fluid collections palpated on exam.  There are no signs of infection on exam.  There are no new changes to the right breast.  Incision is intact and healed well.  There is no overlying erythema or ecchymosis.  We placed injectable saline in the Expander using a sterile technique: Left: 300 cc were placed in the left breast expander  Assessment/Plan  Acquired absence of both breasts   I discussed with the patient to continue to monitor the area.  I discussed with the patient that she will have to come back on Friday for another  fill if it the expander loses volume again.  Patient expressed understanding.  I discussed with the patient that we are still planning to hopefully take her back to surgery in the next week or two to exchange the expander and place the expander on the right side as well.  Patient is breast understanding and was in agreement.  I instructed the patient to call if she has any questions or concerns in the meantime.  Jennifer Davis 09/01/2022, 2:00 PM

## 2022-09-03 ENCOUNTER — Telehealth: Payer: Self-pay | Admitting: *Deleted

## 2022-09-03 NOTE — Telephone Encounter (Signed)
09/02/22 - Dr. Marla Roe added on procedure for L breast. Auth submitted and is pending with [facility]

## 2022-09-04 ENCOUNTER — Ambulatory Visit (INDEPENDENT_AMBULATORY_CARE_PROVIDER_SITE_OTHER): Payer: 59 | Admitting: Surgical

## 2022-09-04 DIAGNOSIS — C50412 Malignant neoplasm of upper-outer quadrant of left female breast: Secondary | ICD-10-CM | POA: Diagnosis not present

## 2022-09-04 DIAGNOSIS — Z9013 Acquired absence of bilateral breasts and nipples: Secondary | ICD-10-CM | POA: Diagnosis not present

## 2022-09-04 DIAGNOSIS — Z170 Estrogen receptor positive status [ER+]: Secondary | ICD-10-CM | POA: Diagnosis not present

## 2022-09-04 NOTE — Progress Notes (Signed)
   Referring Provider Wenda Low, MD [place] [facility] [address] [place],  [place] [address]   CC:  Chief Complaint  Patient presents with   Post-op Follow-up      Jennifer Davis is an 46 y.o. female.  HPI: 46 year old female here for follow-up on her bilateral breast reconstruction.  She currently has a left breast tissue expander in place, had her right expander removed on May 2022.  She was last seen in the office on 09/01/2022, the left breast expander has deflated and we have been reexpanding it every few days to prevent the pocket from scarring down prior to expander exchange.  At the last appointment they placed 300 cc of injectable saline in the left breast tissue expander.  At her last appointment provider spoke with patient in regard to scheduling her for surgery, hopefully next week.  Pending authorization at this time.  Pt reports she has noticed the left expander has decreased in volume.  Review of Systems General: no fevers/chills  Physical Exam    07/25/2022    5:26 PM 07/25/2022    5:15 PM 07/25/2022    4:45 PM  Vitals with BMI  Systolic  557 322  Diastolic  025 427  Pulse 84 79 79    General:  No acute distress,  Alert and oriented, Non-Toxic, Normal speech and affect Left breast incision in-tact, healing well. No erythema. No cellulitic changes. NO subq fluid collections. Skin is loose.   Assessment/Plan 200 cc of fluid was injected in the tissue expander. Pt tolerated this well.  We are awaiting insurance approval for replacement of L breast tissue expander.  Follow up next week for another fill to keep the pocket from scarring down.  [name] [name] 09/04/2022, 3:36 PM

## 2022-09-09 DIAGNOSIS — R632 Polyphagia: Secondary | ICD-10-CM | POA: Diagnosis not present

## 2022-09-09 DIAGNOSIS — K5903 Drug induced constipation: Secondary | ICD-10-CM | POA: Diagnosis not present

## 2022-09-09 DIAGNOSIS — E669 Obesity, unspecified: Secondary | ICD-10-CM | POA: Diagnosis not present

## 2022-09-09 DIAGNOSIS — Z6834 Body mass index (BMI) 34.0-34.9, adult: Secondary | ICD-10-CM | POA: Diagnosis not present

## 2022-09-10 ENCOUNTER — Encounter: Payer: Self-pay | Admitting: Student

## 2022-09-10 ENCOUNTER — Ambulatory Visit: Payer: 59 | Admitting: Student

## 2022-09-10 ENCOUNTER — Other Ambulatory Visit (HOSPITAL_COMMUNITY): Payer: Self-pay

## 2022-09-10 DIAGNOSIS — Z170 Estrogen receptor positive status [ER+]: Secondary | ICD-10-CM | POA: Diagnosis not present

## 2022-09-10 DIAGNOSIS — C50412 Malignant neoplasm of upper-outer quadrant of left female breast: Secondary | ICD-10-CM | POA: Diagnosis not present

## 2022-09-10 MED ORDER — WEGOVY 1 MG/0.5ML ~~LOC~~ SOAJ
1.0000 mg | SUBCUTANEOUS | 1 refills | Status: DC
  Filled 2022-09-10 – 2022-09-13 (×2): qty 2, 28d supply, fill #0
  Filled 2022-10-17: qty 2, 28d supply, fill #1

## 2022-09-10 NOTE — Progress Notes (Signed)
   Referring Provider Wenda Low, MD [place] [address] [place],  [place] [address]   CC: No chief complaint on file.     Jennifer Davis is an 46 y.o. female.  HPI: Patient is a 46 year old female with PMH of left-sided breast cancer s/p bilateral mastectomy with implant-based reconstruction who presents to clinic for reconstruction follow-up.   Her reconstruction was complicated by right-sided seroma and expander problems leading to washout and replacement.  However, she continued to experience problems with the right side expander and was ultimately removed May 2022.    Patient was last seen in the clinic on 09/04/2022.  At this visit, patient reported that she noticed the left expander had decreased in volume.  On exam, the skin appeared to be loose.  There were no subcutaneous fluid collections noted on exam.  200 cc of fluid was injected into the tissue expander.   Today, patient reports she is doing well.  She states that the expander in the left breast has deflated again.  She denies any other issues or concerns.  Patient states that she is still thinking about getting a latissimus flap on the right side but is unsure at this time.   Review of Systems General: No recent changes in her health  Physical Exam    07/25/2022    5:26 PM 07/25/2022    5:15 PM 07/25/2022    4:45 PM  Vitals with BMI  Systolic  191 478  Diastolic  295 621  Pulse 84 79 79    General:  No acute distress,  Alert and oriented, Non-Toxic, Normal speech and affect Chaperone present on exam.  On exam, patient is sitting upright in no acute distress.  The left breast expander appears to have lost its volume.  The skin is loose.  There is no overlying erythema.  There are no signs of infection on exam.  There are no fluid collections palpated on exam.  We placed injectable saline in the Expander using a sterile technique: Left: 250 cc   Assessment/Plan  Malignant neoplasm of upper-outer quadrant of  left breast in female, estrogen receptor positive ([place])   Patient tolerated the expander fill well.  We will have her come back earlier next week to have another fill to keep the pocket expanded.  Patient expressed understanding.  I discussed with the patient some aspects of a latissimus flap surgery that she was asking about.  I told the patient that she will have to discuss the latissimus flap a little bit more with Dr. Marla Roe if that is what she decides to move forward with.  Patient expressed understanding.  We are awaiting patient to be scheduled for surgery.  Patient to follow-up next week for another fill.  I instructed the patient to call if she has any questions or concerns.  Clance Boll 09/10/2022, 10:48 AM

## 2022-09-14 ENCOUNTER — Other Ambulatory Visit (HOSPITAL_COMMUNITY): Payer: Self-pay

## 2022-09-15 ENCOUNTER — Ambulatory Visit (INDEPENDENT_AMBULATORY_CARE_PROVIDER_SITE_OTHER): Payer: 59 | Admitting: Student

## 2022-09-15 VITALS — BP 128/77 | HR 81 | Ht 60.0 in | Wt 180.4 lb

## 2022-09-15 DIAGNOSIS — C50412 Malignant neoplasm of upper-outer quadrant of left female breast: Secondary | ICD-10-CM

## 2022-09-15 DIAGNOSIS — Z9013 Acquired absence of bilateral breasts and nipples: Secondary | ICD-10-CM

## 2022-09-15 DIAGNOSIS — Z170 Estrogen receptor positive status [ER+]: Secondary | ICD-10-CM

## 2022-09-15 NOTE — Progress Notes (Signed)
Referring Provider Wenda Low, MD [place] [address] [place],  [place] [address]   CC:  Chief Complaint  Patient presents with   Pre-op Exam      Jennifer Davis is an 46 y.o. female.  HPI: Patient is a 46 year old female with past medical history of left-sided breast cancer status post bilateral mastectomy with implant-based reconstruction.  She presents to the clinic today for a preoperative appointment for her upcoming procedure breast reconstruction with placement of tissue expander and Flex HD to the right breast and removal of expander and placement of implant to the left breast with Dr. Marla Roe on 09/28/2022.  She also presents today for another expander fill to the left breast expander.  Patient states she is still considering different options for reconstruction, especially to her right breast including a latissimus flap or Diep flap.  Patient also reports that she has been losing weight and would like to lose another 40 to 50 pounds.  She states that she has been working with healthy weight and wellness on this.  Patient denies any issues since her last visit.    Review of Systems General: No changes since her previous appointment, no fevers or chills  Physical Exam    09/15/2022    9:54 AM 07/25/2022    5:26 PM 07/25/2022    5:15 PM  Vitals with BMI  Height '5\' 0"'$     Weight 180 lbs 6 oz    BMI 81.27    Systolic 517  001  Diastolic 77  749  Pulse 81 84 79    General:  No acute distress,  Alert and oriented, Non-Toxic, Normal speech and affect Chaperone present on exam.  On exam, patient is sitting upright in no acute distress.  The expander is in place to the left breast and appears to have lost its volume.  The skin is loose.  There are no subcutaneous fluid collections palpated on exam.  There is no overlying erythema, ecchymosis or swelling.  There are no signs of infection on exam.  We placed injectable saline in the Expander using a sterile  technique: Left: 250 cc   Assessment/Plan Malignant neoplasm of upper-outer quadrant of left breast in female, estrogen receptor positive (HCC)  S/P mastectomy, bilateral   Dr. Marla Roe had the opportunity to come in and speak with the patient regarding her different options for surgery after her expansion today.  Given the patient is working on losing weight and wants to continue to do so, it was discussed that an implant-based reconstruction may not be the best option for her at this time given that her weight will most likely change.  It was discussed with the patient that she could have another expander placed in the left breast and expander placed in the right breast while the patient continues to lose weight and she could have later have the implant exchange.  Patient stated that she did not want to undergo left breast expander placement again.    The possibility of a latissimus flap was also discussed with the patient for the right side.  It was discussed with her that if she does have another expander placed on the left side, she could possibly need another latissimus flap on the left depending on the course of healing and expansion to the left side.  The possibility of a Diep flap was discussed with the patient.  It was discussed with the patient that this may be a good option for her  given her recent weight loss and that she would most likely only have to undergo one surgery rather than several as she would with the implant-based reconstruction.  Patient states that she has been thinking about this option and after discussing this with Dr. Marla Roe she would like to have a consult with Dr. Veda Canning at [facility] regarding this possibility.  We discussed with the patient that we will postpone her surgery for the breast reconstruction with placement of tissue expander and Flex HD to the right breast and removal of expander and placement of implant to the left breast for now as she explores a Diep flap  as a possible route for reconstruction.  I did discuss with the patient that at any time if she would like to still move forward with an implant based reconstruction that she can let us know and we will move forward with that plan.  Patient expressed understanding and was in agreement with this plan.  We discussed with the patient that we will not need to keep doing the expander fills if she is going to pursue the Diep flap, but if she does want to move forward with the implant-based reconstruction including the tissue expander she will need to come back for further fills to keep the area expanded.  Patient expressed understanding and plans to move forward with the autologous reconstruction at this time.  We will send a referral to Dr. Veda Canning at [facility].  Patient will follow-up with Dr. Marla Roe via televisit in 2 weeks to discuss the plan.  I instructed the patient to call in the meantime if she has any questions or concerns.  Pictures were obtained of the patient and placed in the chart with the patient's or guardian's permission.   Today's visit is a no charge visit as patient was coming in for a no charge visit preoperative appointment today and the appointment charge will remain the same.  Clance Boll 09/15/2022, 10:48 AM

## 2022-09-16 ENCOUNTER — Telehealth: Payer: Self-pay | Admitting: Student

## 2022-09-16 ENCOUNTER — Other Ambulatory Visit (HOSPITAL_COMMUNITY): Payer: Self-pay

## 2022-09-16 NOTE — Telephone Encounter (Signed)
Patient called the clinic this morning stating that she has changed her mind since yesterday's visit and would rather move forward with the original plan we had in place rather than a Diep flap.  She states that she does not want to do a consult for the Diep flap and would rather just move forward with the scheduled procedure on 09/28/2022.  I discussed with the patient that she will have to come in this Friday so we can fill her again and do her preoperative appointment.  Patient expressed understanding.  Dr. Marla Roe is aware.

## 2022-09-17 ENCOUNTER — Telehealth: Payer: Self-pay | Admitting: Student

## 2022-09-17 NOTE — Telephone Encounter (Signed)
Pt stated that Jennifer Davis was supposed to call her back to let her know when she was supposed to come in tomorrow for Friday 11.10.23. She would like someone to give her a call to let her know when Jennifer Leo E. Wants her to come in. Please and thank you

## 2022-09-18 ENCOUNTER — Telehealth: Payer: Self-pay | Admitting: Student

## 2022-09-18 ENCOUNTER — Other Ambulatory Visit (HOSPITAL_COMMUNITY): Payer: Self-pay

## 2022-09-18 ENCOUNTER — Ambulatory Visit: Payer: 59 | Admitting: Student

## 2022-09-18 VITALS — BP 111/75 | HR 90 | Ht 59.5 in | Wt 181.4 lb

## 2022-09-18 DIAGNOSIS — Z719 Counseling, unspecified: Secondary | ICD-10-CM

## 2022-09-18 DIAGNOSIS — Z170 Estrogen receptor positive status [ER+]: Secondary | ICD-10-CM

## 2022-09-18 DIAGNOSIS — C50412 Malignant neoplasm of upper-outer quadrant of left female breast: Secondary | ICD-10-CM

## 2022-09-18 DIAGNOSIS — Z9013 Acquired absence of bilateral breasts and nipples: Secondary | ICD-10-CM

## 2022-09-18 MED ORDER — CEPHALEXIN 500 MG PO CAPS
500.0000 mg | ORAL_CAPSULE | Freq: Four times a day (QID) | ORAL | 0 refills | Status: AC
  Filled 2022-09-18: qty 12, 3d supply, fill #0

## 2022-09-18 MED ORDER — OXYCODONE HCL 5 MG PO TABS
5.0000 mg | ORAL_TABLET | Freq: Three times a day (TID) | ORAL | 0 refills | Status: DC | PRN
  Filled 2022-09-18: qty 20, 7d supply, fill #0

## 2022-09-18 MED ORDER — DIAZEPAM 2 MG PO TABS
2.0000 mg | ORAL_TABLET | Freq: Two times a day (BID) | ORAL | 0 refills | Status: DC | PRN
  Filled 2022-09-18: qty 20, 10d supply, fill #0

## 2022-09-18 MED ORDER — ONDANSETRON HCL 4 MG PO TABS
4.0000 mg | ORAL_TABLET | Freq: Three times a day (TID) | ORAL | 0 refills | Status: DC | PRN
  Filled 2022-09-18: qty 20, 7d supply, fill #0

## 2022-09-18 NOTE — Telephone Encounter (Signed)
Dr. Lindi Adie was contacted in regards to the patient holding her tamoxifen.  He states that it is okay for her to hold for 2 weeks before and after surgery.  I called the patient and let her know she may hold her tamoxifen 2 weeks before and after surgery.  Patient expressed understanding.

## 2022-09-18 NOTE — Progress Notes (Signed)
Patient ID: Jennifer Davis, female    DOB: [birth year], 46 y.o.   MRN: [id number]  Chief Complaint  Patient presents with   Pre-op Exam    No diagnosis found.   History of Present Illness: Jennifer Davis is a 46 y.o.  female  with a history of breast cancer.  She presents for preoperative evaluation for upcoming procedure, breast reconstruction with placement of tissue expander and Flex HD to the right and removal of tissue expander and placement of implant to the left, scheduled for 09/28/2022 with Dr.  Marla Roe  The patient has not had problems with anesthesia.  Patient reports she had issues with palpitations a few years ago, but reports that it was nothing that serious and nothing that needed to be treated with medications.  She states she does not follow-up with a cardiologist.  She states that she follows up with PCP if she experiences palpitations.  Patient states she is not a smoker.  Patient reports that she is currently taking tamoxifen.  She denies any history of miscarriages.  She denies any personal or family history of blood clots or clotting diseases.  She denies any recent traumas, surgeries infections or hospitalizations.  She denies any history of stroke or MI.  She denies current Port-A-Cath.  Patient reports history of asthma.  She reports history of cancer.  She denies any recent fevers, chills or shortness of breath.  She denies any recent changes in her health.  Summary of Previous Visit: Patient was last seen in the clinic on 09/15/2022.  At this visit, patient reported that she was interested in a Diep flap.  Dr. Marla Roe discussed the different options for the patient and at this visit, she reported that she wanted to move forward with a consult for Diep flap.  Patient then called the next day and stated that she did not want to do the Diep flap, and would rather move forward with the plan that was originally in place.  Plan was for patient to follow-up in a few days so  she can have her expander filled and her preoperative appointment for her upcoming surgery.  Job: Patient works for [employer] doing 12-hour shifts and also works at [employer].  Plan for 3 weeks off.  I discussed that she can go back to her lows job after 3 weeks on light duty, and will most likely have no restrictions at 6 weeks.  PMH Significant for: Breast cancer, asthma  Chemotherapy/radiation: Had previous radiation to the left breast.  Patient states she is no longer taking prednisone.  I discussed the possibility of a left breast expander as was discussed at her previous visit.  Patient states that she is unsure at this time if she would want to move forward with a left breast expander, and would like more time to think about it.  At the moment, she states she wants to move forward with the implant to the left breast.   Past Medical History: Allergies: Allergies  Allergen Reactions   Bee Venom Hives    wheezy   Other Itching    environmental allergies sometime activate asthma    Current Medications:  Current Outpatient Medications:    albuterol (PROAIR HFA) 108 (90 Base) MCG/ACT inhaler, Inhale 2 puffs into the lungs every 4-6 hours as needed., Disp: 6.7 g, Rfl: 0   albuterol (VENTOLIN HFA) 108 (90 Base) MCG/ACT inhaler, Inhale 2 puffs into the lungs every 4-6 hours as  needed., Disp: 8.5 g, Rfl: 0   budesonide-formoterol (SYMBICORT) 160-4.5 MCG/ACT inhaler, Inhale 2 puffs into the lungs 2 (two) times daily., Disp: 10.2 g, Rfl: 5   budesonide-formoterol (SYMBICORT) 160-4.5 MCG/ACT inhaler, Inhale 2 puffs into the lungs 2 (two) times daily., Disp: 10.2 g, Rfl: 5   cephALEXin (KEFLEX) 500 MG capsule, Take 1 capsule (500 mg total) by mouth 4 (four) times daily., Disp: 20 capsule, Rfl: 0   cetirizine (ZYRTEC) 10 MG tablet, Take 10 mg by mouth daily., Disp: , Rfl:    Cholecalciferol (VITAMIN D) 50 MCG (2000 UT) tablet, Take 1 tablet (2,000 Units total) by mouth  daily., Disp: , Rfl:    diphenhydrAMINE (BENADRYL) 25 MG tablet, Take 50 mg by mouth every 6 (six) hours as needed (hives)., Disp: , Rfl:    EPINEPHrine (EPIPEN 2-PAK) 0.3 mg/0.3 mL IJ SOAJ injection, Inject 0.3 mLs (0.3 mg total) into the muscle once as needed for up to 1 dose (for severe allergic reaction). CAll 911 immediately if you have to use this medicine, Disp: 1 each, Rfl: 1   EPINEPHrine 0.3 mg/0.3 mL IJ SOAJ injection, Inject 0.3 mg into the muscle as needed for anaphylaxis., Disp: 1 each, Rfl: 0   EPINEPHrine 0.3 mg/0.3 mL IJ SOAJ injection, Use as directed for anaphylaxis, Disp: 2 each, Rfl: 1   escitalopram (LEXAPRO) 20 MG tablet, Take 1 tablet (20 mg total) by mouth daily., Disp: 90 tablet, Rfl: 0   famotidine (PEPCID) 20 MG tablet, Take 1 tablet (20 mg total) by mouth daily., Disp: 7 tablet, Rfl: 0   fluconazole (DIFLUCAN) 150 MG tablet, Take 1 tablet (150 mg total) by mouth now and repeat in 3 days, Disp: 2 tablet, Rfl: 0   gabapentin (NEURONTIN) 100 MG capsule, Take 1 capsule (100 mg total) by mouth 3 (three) times daily. (Patient taking differently: Take 100-300 mg by mouth 2 (two) times daily as needed (pain). Max 300 mg per day), Disp: 270 capsule, Rfl: 0   ibuprofen (ADVIL) 200 MG tablet, Take 400 mg by mouth every 6 (six) hours as needed for moderate pain., Disp: , Rfl:    melatonin 5 MG TABS, Take 5 mg by mouth at bedtime as needed (sleep)., Disp: , Rfl:    montelukast (SINGULAIR) 10 MG tablet, Take 1 tablet (10 mg total) by mouth every evening., Disp: 30 tablet, Rfl: 5   montelukast (SINGULAIR) 10 MG tablet, Take 1 tablet (10 mg total) by mouth every evening., Disp: 30 tablet, Rfl: 5   nystatin ointment (MYCOSTATIN), Apply 1 application externally 2 (two) times daily for 14 days, Disp: 30 g, Rfl: 2   Olopatadine HCl (PATADAY OP), Place 1 drop into both eyes daily as needed (allergies)., Disp: , Rfl:    Olopatadine HCl 0.6 % SOLN, Place 2 sprays into both nostrils 2 (two) times  daily. (Patient taking differently: Place 2 sprays into both nostrils 2 (two) times daily as needed (allergies).), Disp: 30.5 g, Rfl: 5   predniSONE (DELTASONE) 50 MG tablet, 1 tablet PO daily, Disp: 4 tablet, Rfl: 0   Probiotic Product ([employer] PROBIOTIC PO), Take 1 capsule by mouth daily., Disp: , Rfl:    Semaglutide, 1 MG/DOSE, (OZEMPIC, 1 MG/DOSE,) 4 MG/3ML SOPN, Inject 1 mg into the skin once a week., Disp: 3 mL, Rfl: 0   Semaglutide-Weight Management (WEGOVY) 1 MG/0.5ML SOAJ, Inject 1 mg into the skin once a week., Disp: 2 mL, Rfl: 2   Semaglutide-Weight Management (WEGOVY) 1 MG/0.5ML SOAJ, Inject 1 mg  into the skin once a week., Disp: 2 mL, Rfl: 2   Semaglutide-Weight Management (WEGOVY) 1 MG/0.5ML SOAJ, Inject 1 mg into the skin once a week., Disp: 2 mL, Rfl: 1   tamoxifen (NOLVADEX) 10 MG tablet, Take 1 tablet (10 mg total) by mouth daily., Disp: 90 tablet, Rfl: 3   triamcinolone lotion (KENALOG) 0.1 %, Apply 1 Application topically 2 (two) times daily for 10 days., Disp: 60 mL, Rfl: 0  Past Medical Problems: Past Medical History:  Diagnosis Date   Anxiety    Asthma    Asthma due to seasonal allergies    Back pain    Breast cancer (HCC)    Constipation    Depression    Dyspnea    after chemo - fatiqued   Fluid retention    GERD (gastroesophageal reflux disease)    occasionl   History of radiation therapy    left chest wall and subclavian 07/09/2021-08/22/2021   Dr Gery Pray   Joint pain    Lower extremity edema    Morbid obesity ([place])    Palpitations 2017   tachy    Palpitations 01/09/2016   Prediabetes    PTSD (post-traumatic stress disorder)    SOB (shortness of breath)    Swallowing difficulty     Past Surgical History: Past Surgical History:  Procedure Laterality Date   BREAST RECONSTRUCTION WITH PLACEMENT OF TISSUE EXPANDER AND FLEX HD (ACELLULAR HYDRATED DERMIS) Bilateral 12/04/2020   Procedure: IMMEDIATE BILATERAL BREAST RECONSTRUCTION WITH PLACEMENT OF TISSUE  EXPANDER AND FLEX HD (ACELLULAR HYDRATED DERMIS);  Surgeon: Wallace Going, DO;  Location: [facility];  Service: Plastics;  Laterality: Bilateral;   CESAREAN SECTION     x3   DEBRIDEMENT AND CLOSURE WOUND Right 04/10/2021   Procedure: excision of right breast wound with closure;  Surgeon: Wallace Going, DO;  Location: [facility];  Service: Plastics;  Laterality: Right;   MASTECTOMY WITH AXILLARY LYMPH NODE DISSECTION Left 12/04/2020   Procedure: BILATERAL MASTECTOMIES, RADIOACTIVE SEED GUIDED TARGETED LEFT AXILLARY NODE DISSECTION;  Surgeon: Coralie Keens, MD;  Location: [facility];  Service: General;  Laterality: Left;   PORT-A-CATH REMOVAL Left 08/27/2021   Procedure: REMOVAL PORT-A-CATH;  Surgeon: Coralie Keens, MD;  Location: [facility];  Service: General;  Laterality: Left;   PORTACATH PLACEMENT Left 12/04/2020   Procedure: INSERTION PORT-A-CATH LEFT SUBCLAVIAN;  Surgeon: Coralie Keens, MD;  Location: [facility];  Service: General;  Laterality: Left;   REMOVAL OF TISSUE EXPANDER AND PLACEMENT OF IMPLANT Right 01/04/2021   Procedure: REMOVAL OF TISSUE EXPANDER AND PLACEMENT OF NEW EXPANDER;  Surgeon: Wallace Going, DO;  Location: [facility];  Service: Plastics;  Laterality: Right;   TISSUE EXPANDER PLACEMENT Right 03/19/2021   Procedure: Removal of right tissue expander;  Surgeon: Wallace Going, DO;  Location: [facility];  Service: Plastics;  Laterality: Right;  45 min    Social History: Social History   Socioeconomic History   Marital status: Married    Spouse name: Not on file   Number of children: 3   Years of education: associates   Highest education level: Not on file  Occupational History   Occupation: nursing unit clerk    Employer: [employer]  Tobacco Use   Smoking status: Former    Types: Cigarettes    Quit date: 01/09/2012    Years since quitting: 10.6   Smokeless tobacco:  Never   Tobacco comments:  Pt no longer smokes  Substance and Sexual Activity   Alcohol use: Not Currently    Alcohol/week: 0.0 standard drinks of alcohol    Comment: very rare   Drug use: Never   Sexual activity: Not on file  Other Topics Concern   Not on file  Social History Narrative   epworth sleepiness scale = 10 (01/09/2016)   Social Determinants of Health   Financial Resource Strain: Low Risk  (11/21/2021)   Overall Financial Resource Strain (CARDIA)    Difficulty of Paying Living Expenses: Not very hard  Food Insecurity: No Food Insecurity (11/21/2021)   Hunger Vital Sign    Worried About Running Out of Food in the Last Year: Never true    Ran Out of Food in the Last Year: Never true  Transportation Needs: No Transportation Needs (11/21/2021)   PRAPARE - Hydrologist (Medical): No    Lack of Transportation (Non-Medical): No  Physical Activity: Inactive (11/21/2021)   Exercise Vital Sign    Days of Exercise per Week: 0 days    Minutes of Exercise per Session: 0 min  Stress: No Stress Concern Present (11/21/2021)   [place]    Feeling of Stress : Only a little  Social Connections: Moderately Integrated (11/21/2021)   Social Connection and Isolation Panel [NHANES]    Frequency of Communication with Friends and Family: Three times a week    Frequency of Social Gatherings with Friends and Family: Twice a week    Attends Religious Services: 1 to 4 times per year    Active Member of [employer] or Organizations: No    Attends Archivist Meetings: Never    Marital Status: Married  Human resources officer Violence: Not At Risk (11/21/2021)   Humiliation, Afraid, Rape, and Kick questionnaire    Fear of Current or Ex-Partner: No    Emotionally Abused: No    Physically Abused: No    Sexually Abused: No    Family History: Family History  Problem Relation Age of Onset   Cancer Mother     Thyroid disease Mother    Hyperlipidemia Mother    Hypertension Mother    Fibromyalgia Mother    Colon cancer Mother 12   Depression Mother    Anxiety disorder Mother    Obesity Mother    Asthma Sister        x2   Hypertension Maternal Grandmother    Hypertension Maternal Grandfather    Cancer - Lung Maternal Grandfather    Stroke Maternal Grandfather    Yves Dill Parkinson White syndrome Maternal Aunt 95   Colon cancer Other        MGM's niece, dx unknown age   Colon cancer Other        MGM's nephew; dx unknown age   Breast cancer Neg Hx     Review of Systems: Denies fevers or chills.  Denies any recent changes in her health.  Physical Exam: Vital Signs BP 111/75 (BP Location: Right Arm, Patient Position: Sitting, Cuff Size: Large)   Pulse 90   Ht 4' 11.5" (1.511 m)   Wt 181 lb 6.4 oz (82.3 kg)   SpO2 97%   BMI 36.03 kg/m   Physical Exam  Constitutional:      General: Not in acute distress.    Appearance: Normal appearance. Not ill-appearing.  HENT:     Head: Normocephalic and atraumatic.  Neck:     Musculoskeletal: Normal  range of motion.  Cardiovascular:     Rate and Rhythm: Normal rate Pulmonary:     Effort: Pulmonary effort is normal. No respiratory distress.  Breasts: Left breast expanders in place.  It appears to have lost volume.  The skin is loose.  There is no overlying erythema.  There are no subcutaneous fluid collections palpated on exam. Musculoskeletal: Normal range of motion.  Lower Extremities: Lower extremities are nonswollen bilaterally, there are no varicose veins noted. Skin:    General: Skin is warm and dry.     Findings: No erythema or rash.  Neurological:     Mental Status: Alert and oriented to person, place, and time. Mental status is at baseline.  Psychiatric:        Mood and Affect: Mood normal.        Behavior: Behavior normal.   We placed injectable saline in the Expander using a sterile technique: Left: 225 cc    Assessment/Plan: The patient is scheduled for breast reconstruction with placement of tissue expander and Flex HD to the right breast, and removal of tissue expander and placement of implant to the left breast With Dr. Marla Roe.  Risks, benefits, and alternatives of procedure discussed, questions answered and consent obtained.    Smoking Status: Non-smoker; Counseling Given?  N/A  Caprini Score: 6; Risk Factors include: Age, BMI greater than 25, history of malignancy, and length of planned surgery. Recommendation for mechanical prophylaxis. Encourage early ambulation.   Pictures obtained: 09/15/2022  Post-op Rx sent to pharmacy: Oxycodone, Zofran, Keflex, Valium  I discussed with the patient that she should hold her vitamins and supplements 1 week prior to surgery.  I discussed with her that she should hold ibuprofen 1 week prior to surgery.  I discussed with the patient that she should hold her dose of Wegovy at least 1 week prior to surgery.  I did discuss with the patient that typically we have patients hold tamoxifen 2 weeks before and after surgery. I will contact Dr. Lindi Adie, the patient's oncologist, regarding holding the patient's tamoxifen.  We will send clearance to patient's PCP.    Patient will follow-up in the clinic on Tuesday for another expander fill to keep the skin and muscle expander.  Patient was provided with the breast reconstruction and General Surgical Risk consent document and Pain Medication Agreement prior to their appointment.  They had adequate time to read through the risk consent documents and Pain Medication Agreement. We also discussed them in person together during this preop appointment. All of their questions were answered to their satisfaction.  Recommended calling if they have any further questions.  Risk consent form and Pain Medication Agreement to be scanned into patient's chart.  The risks that can be encountered with and after placement of a breast  expander placement were discussed and include the following but not limited to these: bleeding, infection, delayed healing, anesthesia risks, skin sensation changes, injury to structures including nerves, blood vessels, and muscles which may be temporary or permanent, allergies to tape, suture materials and glues, blood products, topical preparations or injected agents, skin contour irregularities, skin discoloration and swelling, deep vein thrombosis, cardiac and pulmonary complications, pain, which may persist, fluid accumulation, wrinkling of the skin over the expander, changes in nipple or breast sensation, expander leakage or rupture, faulty position of the expander, persistent pain, formation of tight scar tissue around the expander (capsular contracture), possible need for revisional surgery or staged procedures.  The risk that can be encountered with  breast augmentation or maastopexy were discussed and include the following but not limited to these:  Breast asymmetry, fluid accumulation, firmness of the breast, inability to breast feed, loss of nipple or areola, skin loss, decrease or no nipple sensation, fat necrosis of the breast tissue, bleeding, infection, healing delay.  Deep vein thrombosis, cardiac and pulmonary complications are risks to any procedure. The implant can have a faulty position or one different from what you had desired.  The implant can have rippling, wrinkling, leakage or rupture. There are risks of anesthesia, changes to skin sensation and injury to nerves or blood vessels.  The muscle can be temporarily or permanently injured.  You may have an allergic reaction to tape, suture, glue, blood products which can result in skin discoloration, swelling, pain, skin lesions, poor healing.  Any of these can lead to the need for revisonal surgery or stage procedures.  A reduction has potential to interfere with diagnostic procedures.  Nipple or breast piercing can increase risks of  infection.    The risks that can be encountered with and after placement of a breast implant placement were discussed and include the following but not limited to these: bleeding, infection, delayed healing, anesthesia risks, skin sensation changes, injury to structures including nerves, blood vessels, and muscles which may be temporary or permanent, allergies to tape, suture materials and glues, blood products, topical preparations or injected agents, skin contour irregularities, skin discoloration and swelling, deep vein thrombosis, cardiac and pulmonary complications, pain, which may persist, fluid accumulation, wrinkling of the skin over the implant, changes in nipple or breast sensation, implant leakage or rupture, faulty position of the implant, persistent pain, formation of tight scar tissue around the implant(capsular contracture), possible need for revisional surgery or staged procedures.    Electronically signed by: Clance Boll, PA-C 09/18/2022 12:01 PM

## 2022-09-21 NOTE — Progress Notes (Signed)
Surgical Instructions    Your procedure is scheduled on Monday, November 20.  Report to [facility] Main Entrance "A" at 9:15 A.M., then check in with the Admitting office.  Call this number if you have problems the morning of surgery:  [phone]   If you have any questions prior to your surgery date call [phone]: Open Monday-Friday 8am-4pm If you experience any cold or flu symptoms such as cough, fever, chills, shortness of breath, etc. between now and your scheduled surgery, please notify us at the above number     Remember:  Do not eat after midnight the night before your surgery  You may drink clear liquids until 8:15AM the morning of your surgery.   Clear liquids allowed are: Water, Non-Citrus Juices (without pulp), Carbonated Beverages, Clear Tea, Black Coffee ONLY (NO MILK, CREAM OR POWDERED CREAMER of any kind), and Gatorade    Take these medicines the morning of surgery with A SIP OF WATER:  cetirizine (ZYRTEC)  escitalopram (LEXAPRO)  famotidine (PEPCID) predniSONE (DELTASONE)  budesonide-formoterol (SYMBICORT) oxyCODONE (ROXICODONE)  if needed ondansetron (ZOFRAN  if needed gabapentin (NEURONTIN)  if needed diazepam (VALIUM)  if needed diphenhydrAMINE (BENADRYL)  if needed Olopatadine HCl (PATADAY OP)  if needed  albuterol (PROAIR HFA) 108 (90 Base) MCG/ACT inhaler  if needed  Follow your surgeon's instructions on when to stop tamoxifen (NOLVADEX).  If no instructions were given by your surgeon then you will need to call the office to get those instructions.    DO NOT TAKE Wegovy (Semaglutide) for 7 days prior to surgery. DO NOT TAKE after 09/20/22.   As of today, STOP taking any Aspirin (unless otherwise instructed by your surgeon) Aleve, Naproxen, Ibuprofen, Motrin, Advil, Goody's, BC's, all herbal medications, fish oil, and all vitamins.            [facility] is not responsible for any belongings or valuables.    Do NOT Smoke (Tobacco/Vaping)  24 hours  prior to your procedure  If you use a CPAP at night, you may bring your mask for your overnight stay.   Contacts, glasses, hearing aids, dentures or partials may not be worn into surgery, please bring cases for these belongings   For patients admitted to the hospital, discharge time will be determined by your treatment team.   Patients discharged the day of surgery will not be allowed to drive home, and someone needs to stay with them for 24 hours.   SURGICAL WAITING ROOM VISITATION Patients having surgery or a procedure may have no more than 2 support people in the waiting area - these visitors may rotate.   Children under the age of 61 must have an adult with them who is not the patient. If the patient needs to stay at the hospital during part of their recovery, the visitor guidelines for inpatient rooms apply. Pre-op nurse will coordinate an appropriate time for 1 support person to accompany patient in pre-op.  This support person may not rotate.   Please refer to [email or web address] for the visitor guidelines for Inpatients (after your surgery is over and you are in a regular room).    Special instructions:    Oral Hygiene is also important to reduce your risk of infection.  Remember - BRUSH YOUR TEETH THE MORNING OF SURGERY WITH YOUR REGULAR TOOTHPASTE   [facility]- Preparing For Surgery  Before surgery, you can play an important role. Because skin is not sterile, your skin needs to be as free of germs as  possible. You can reduce the number of germs on your skin by washing with CHG (chlorahexidine gluconate) Soap before surgery.  CHG is an antiseptic cleaner which kills germs and bonds with the skin to continue killing germs even after washing.     Please do not use if you have an allergy to CHG or antibacterial soaps. If your skin becomes reddened/irritated stop using the CHG.  Do not shave (including legs and underarms) for at  least 48 hours prior to first CHG shower. It is OK to shave your face.  Please follow these instructions carefully.     Shower the NIGHT BEFORE SURGERY and the MORNING OF SURGERY with CHG Soap.   If you chose to wash your hair, wash your hair first as usual with your normal shampoo. After you shampoo, rinse your hair and body thoroughly to remove the shampoo.  Then [employer] and genitals (private parts) with your normal soap and rinse thoroughly to remove soap.  After that Use CHG Soap as you would any other liquid soap. You can apply CHG directly to the skin and wash gently with a scrungie or a clean washcloth.   Apply the CHG Soap to your body ONLY FROM THE NECK DOWN.  Do not use on open wounds or open sores. Avoid contact with your eyes, ears, mouth and genitals (private parts). Wash Face and genitals (private parts)  with your normal soap.   Wash thoroughly, paying special attention to the area where your surgery will be performed.  Thoroughly rinse your body with warm water from the neck down.  DO NOT shower/wash with your normal soap after using and rinsing off the CHG Soap.  Pat yourself dry with a CLEAN TOWEL.  Wear CLEAN PAJAMAS to bed the night before surgery  Place CLEAN SHEETS on your bed the night before your surgery  DO NOT SLEEP WITH PETS.   Day of Surgery:  Take a shower with CHG soap. Wear Clean/Comfortable clothing the morning of surgery Do not wear jewelry or makeup. Do not wear lotions, powders, perfumes/cologne or deodorant. Do not shave 48 hours prior to surgery.  Men may shave face and neck. Do not bring valuables to the hospital. Do not wear nail polish, gel polish, artificial nails, or any other type of covering on natural nails (fingers and toes) If you have artificial nails or gel coating that need to be removed by a nail salon, please have this removed prior to surgery. Artificial nails or gel coating may interfere with anesthesia's ability to  adequately monitor your vital signs. Remember to brush your teeth WITH YOUR REGULAR TOOTHPASTE.    If you received a COVID test during your pre-op visit, it is requested that you wear a mask when out in public, stay away from anyone that may not be feeling well, and notify your surgeon if you develop symptoms. If you have been in contact with anyone that has tested positive in the last 10 days, please notify your surgeon.    Please read over the following fact sheets that you were given.

## 2022-09-22 ENCOUNTER — Other Ambulatory Visit: Payer: Self-pay

## 2022-09-22 ENCOUNTER — Encounter: Payer: Self-pay | Admitting: Student

## 2022-09-22 ENCOUNTER — Telehealth: Payer: Self-pay

## 2022-09-22 ENCOUNTER — Encounter (HOSPITAL_COMMUNITY)
Admission: RE | Admit: 2022-09-22 | Discharge: 2022-09-22 | Disposition: A | Discharge status: Home / Self Care | Payer: 59 | Source: Ambulatory Visit | Attending: Plastic Surgery | Admitting: Plastic Surgery

## 2022-09-22 ENCOUNTER — Encounter (HOSPITAL_COMMUNITY): Payer: Self-pay

## 2022-09-22 ENCOUNTER — Ambulatory Visit (INDEPENDENT_AMBULATORY_CARE_PROVIDER_SITE_OTHER): Payer: 59 | Admitting: Student

## 2022-09-22 VITALS — BP 114/70 | HR 92 | Temp 98.2°F | Resp 18 | Ht 59.0 in | Wt 177.6 lb

## 2022-09-22 DIAGNOSIS — Z170 Estrogen receptor positive status [ER+]: Secondary | ICD-10-CM | POA: Diagnosis not present

## 2022-09-22 DIAGNOSIS — Z9013 Acquired absence of bilateral breasts and nipples: Secondary | ICD-10-CM

## 2022-09-22 DIAGNOSIS — C50412 Malignant neoplasm of upper-outer quadrant of left female breast: Secondary | ICD-10-CM

## 2022-09-22 DIAGNOSIS — Z01812 Encounter for preprocedural laboratory examination: Secondary | ICD-10-CM | POA: Insufficient documentation

## 2022-09-22 DIAGNOSIS — E119 Type 2 diabetes mellitus without complications: Secondary | ICD-10-CM | POA: Insufficient documentation

## 2022-09-22 DIAGNOSIS — Z719 Counseling, unspecified: Secondary | ICD-10-CM

## 2022-09-22 HISTORY — DX: Type 2 diabetes mellitus without complications: E11.9

## 2022-09-22 LAB — CBC
HCT: 41 % (ref 36.0–46.0)
Hemoglobin: 13.4 g/dL (ref 12.0–15.0)
MCH: 29.7 pg (ref 26.0–34.0)
MCHC: 32.7 g/dL (ref 30.0–36.0)
MCV: 90.9 fL (ref 80.0–100.0)
Platelets: 276 10*3/uL (ref 150–400)
RBC: 4.51 MIL/uL (ref 3.87–5.11)
RDW: 12.8 % (ref 11.5–15.5)
WBC: 7.9 10*3/uL (ref 4.0–10.5)
nRBC: 0 % (ref 0.0–0.2)

## 2022-09-22 LAB — BASIC METABOLIC PANEL
Anion gap: 10 (ref 5–15)
BUN: 11 mg/dL (ref 6–20)
CO2: 24 mmol/L (ref 22–32)
Calcium: 9.6 mg/dL (ref 8.9–10.3)
Chloride: 106 mmol/L (ref 98–111)
Creatinine, Ser: 0.72 mg/dL (ref 0.44–1.00)
GFR, Estimated: >60 mL/min (ref >60)
Glucose, Bld: 97 mg/dL (ref 70–99)
Potassium: 4 mmol/L (ref 3.5–5.1)
Sodium: 140 mmol/L (ref 135–145)

## 2022-09-22 LAB — HEMOGLOBIN A1C
Hgb A1c MFr Bld: 5.4 % (ref 4.8–5.6)
Mean Plasma Glucose: 108.28 mg/dL

## 2022-09-22 LAB — GLUCOSE, CAPILLARY: Glucose-Capillary: 93 mg/dL (ref 70–99)

## 2022-09-22 NOTE — Telephone Encounter (Signed)
Faxed external referral to Dr. Gaye Alken office. Received success confirmation.

## 2022-09-22 NOTE — H&P (View-Only) (Signed)
   Referring Provider Wenda Low, MD [place] [address] [place],  [place] [address]   CC:  Chief Complaint  Patient presents with   Post-op Follow-up      Jennifer Davis is an 46 y.o. female.  HPI:  Patient is a 46 year old female with past medical history of left-sided breast cancer status post bilateral mastectomy with implant-based reconstruction.  She presents to the clinic today for a preoperative appointment for her upcoming procedure breast reconstruction with placement of tissue expander and Flex HD to the right breast and removal of expander and placement of implant to the left breast with Dr. Marla Roe on 09/28/2022.  She also presents today for another expander fill to the left breast expander.   Patient reports she is doing well today.  She reports the expander has lost volume again.  She denies any new issues or concerns.  Review of Systems General: Denies any changes  Physical Exam    09/22/2022    8:02 AM 09/18/2022    8:55 AM 09/15/2022    9:54 AM  Vitals with BMI  Height '4\' 11"'$  4' 11.5" '5\' 0"'$   Weight 177 lbs 10 oz 181 lbs 6 oz 180 lbs 6 oz  BMI 35.85 53.61 44.31  Systolic 540 086 761  Diastolic 70 75 77  Pulse 92 90 81    General:  No acute distress,  Alert and oriented, Non-Toxic, Normal speech and affect Chaperone present on exam.  On exam, patient sitting upright in no acute distress.  Left breast expander is in place.  It has lost volume.  There are no subcutaneous fluid collections palpated on exam.  There is no overlying erythema or ecchymosis.  There are no signs of infection on exam.  We placed injectable saline in the Expander using a sterile technique: Left: 225 cc   Assessment/Plan  S/P mastectomy, bilateral  Malignant neoplasm of upper-outer quadrant of left breast in female, estrogen receptor positive ([place])   I discussed the patient's case with Dr. Marla Roe.  Plan is to have both the implant and the expander in the operating  room.  She will place the implant to the left breast if the skin and muscle allows.  If it does not, an expander will be placed on the left side.  I discussed this with the patient and she was in agreement with the plan.  Patient to follow-up on Friday for one last expander fill before surgery.  I instructed the patient to call if she has any questions or concerns.   Clance Boll 09/22/2022, 3:24 PM

## 2022-09-22 NOTE — Progress Notes (Signed)
PCP - Wenda Low Cardiologist - Denies  PPM/ICD - Denies Device Orders -  Rep Notified -   Chest x-ray - NI EKG - 07/27/22 Stress Test - denies ECHO - 12/30/20 Cardiac Cath - Denies  Sleep Study - Yes no OSA  DM - Diabetes II CBG 93 at PAT appt Fasting Blood Sugar - Does not check at home at this time  Last dose of GLP1 agonist-  09/18/22 GLP1 instructions: Is holding until after procedure per surgeon's instructions  Blood Thinner Instructions:Denies Aspirin Instructions:Denies   COVID TEST- Not indicated   Anesthesia review: No  Patient denies shortness of breath, fever, cough and chest pain at PAT appointment   All instructions explained to the patient, with a verbal understanding of the material. Patient agrees to go over the instructions while at home for a better understanding. The opportunity to ask questions was provided.

## 2022-09-22 NOTE — Progress Notes (Signed)
   Referring Provider Wenda Low, MD [place] [address] [place],  [place] [address]   CC:  Chief Complaint  Patient presents with   Post-op Follow-up      Jennifer Davis is an 46 y.o. female.  HPI:  Patient is a 46 year old female with past medical history of left-sided breast cancer status post bilateral mastectomy with implant-based reconstruction.  She presents to the clinic today for a preoperative appointment for her upcoming procedure breast reconstruction with placement of tissue expander and Flex HD to the right breast and removal of expander and placement of implant to the left breast with Dr. Marla Roe on 09/28/2022.  She also presents today for another expander fill to the left breast expander.   Patient reports she is doing well today.  She reports the expander has lost volume again.  She denies any new issues or concerns.  Review of Systems General: Denies any changes  Physical Exam    09/22/2022    8:02 AM 09/18/2022    8:55 AM 09/15/2022    9:54 AM  Vitals with BMI  Height '4\' 11"'$  4' 11.5" '5\' 0"'$   Weight 177 lbs 10 oz 181 lbs 6 oz 180 lbs 6 oz  BMI 35.85 64.15 83.09  Systolic 407 680 881  Diastolic 70 75 77  Pulse 92 90 81    General:  No acute distress,  Alert and oriented, Non-Toxic, Normal speech and affect Chaperone present on exam.  On exam, patient sitting upright in no acute distress.  Left breast expander is in place.  It has lost volume.  There are no subcutaneous fluid collections palpated on exam.  There is no overlying erythema or ecchymosis.  There are no signs of infection on exam.  We placed injectable saline in the Expander using a sterile technique: Left: 225 cc   Assessment/Plan  S/P mastectomy, bilateral  Malignant neoplasm of upper-outer quadrant of left breast in female, estrogen receptor positive ([place])   I discussed the patient's case with Dr. Marla Roe.  Plan is to have both the implant and the expander in the operating  room.  She will place the implant to the left breast if the skin and muscle allows.  If it does not, an expander will be placed on the left side.  I discussed this with the patient and she was in agreement with the plan.  Patient to follow-up on Friday for one last expander fill before surgery.  I instructed the patient to call if she has any questions or concerns.   Clance Boll 09/22/2022, 3:24 PM

## 2022-09-24 ENCOUNTER — Telehealth: Payer: Self-pay | Admitting: Student

## 2022-09-24 NOTE — Telephone Encounter (Signed)
I made the patient aware of the time change for her surgery. I instructed the patient to arrive the morning of surgery at 8AM as surgery was moved up to 10:15. Patient expressed understanding.

## 2022-09-25 ENCOUNTER — Telehealth: Payer: 59 | Admitting: Plastic Surgery

## 2022-09-25 ENCOUNTER — Ambulatory Visit (INDEPENDENT_AMBULATORY_CARE_PROVIDER_SITE_OTHER): Payer: 59 | Admitting: Physician Assistant

## 2022-09-25 ENCOUNTER — Encounter: Payer: Self-pay | Admitting: Physician Assistant

## 2022-09-25 VITALS — BP 122/86 | HR 100

## 2022-09-25 DIAGNOSIS — Z9013 Acquired absence of bilateral breasts and nipples: Secondary | ICD-10-CM | POA: Diagnosis not present

## 2022-09-25 NOTE — Progress Notes (Signed)
Pt made aware of surgery time change 1030-1320, arrival 0830, and stop drinking clear liquids by 0730.

## 2022-09-25 NOTE — Progress Notes (Signed)
   Referring Provider Wenda Low, MD [place] [address] [place],   [address]   CC:  Chief Complaint  Patient presents with   Post-op Follow-up      Jennifer Davis is an 46 y.o. female.    HPI:   This is a 46 year old female with a past medical history of left-sided breast cancer status post bilateral mastectomy with implant-based reconstruction.  She presents to the clinic today for follow-up evaluation and expander fill.  Her postop course has been complicated.  As she has had a rupture of her left-sided expander and has had removal of the right sided expander.   She was last seen in the office on 09/22/2022.  She had injectable saline placed into the expander to help assure no contracture or buildup of scar tissue.,  Since her last office visit she denies any significant complaints or concerns no infectious signs or symptoms, no issues with the skin.  She will undergo breast reconstruction with placement of tissue expander and Flex HD to the right and removal of tissue expander and placement of implant to the left schedule on 09/28/2002 with Dr. Marla Roe.  Review of Systems General: negative for fever  Physical Exam    09/25/2022   10:39 AM 09/22/2022    8:02 AM 09/18/2022    8:55 AM  Vitals with BMI  Height  '4\' 11"'$  4' 11.5"  Weight  177 lbs 10 oz 181 lbs 6 oz  BMI  11.91 47.82  Systolic 956 213 086  Diastolic 86 70 75  Pulse 578 92 90    General:  No acute distress,  Alert and oriented, Non-Toxic, Normal speech and affect Left expander in place with loss of volume, no overlying redness, flaps are viable incisions clean dry and intact.  Right mastectomy flaps are viable, incision clean dry and intact.  240 cc of injectable saline was placed into the left tissue expander  Assessment/Plan Overall the patient is doing well, she has no signs of infection, the expander was refilled again today.  She will undergo operative intervention on 09/28/2022.  The  patient will return immediately if she develops any new or worsening signs or symptoms.  She verbalized understanding and agreement to today's plan.  Jennifer Davis 09/25/2022, 3:30 PM

## 2022-09-25 NOTE — H&P (View-Only) (Signed)
   Referring Provider Wenda Low, MD [place] [address] [place],  [place] [address]   CC:  Chief Complaint  Patient presents with   Post-op Follow-up      Jennifer Davis is an 46 y.o. female.    HPI:   This is a 46 year old female with a past medical history of left-sided breast cancer status post bilateral mastectomy with implant-based reconstruction.  She presents to the clinic today for follow-up evaluation and expander fill.  Her postop course has been complicated.  As she has had a rupture of her left-sided expander and has had removal of the right sided expander.   She was last seen in the office on 09/22/2022.  She had injectable saline placed into the expander to help assure no contracture or buildup of scar tissue.,  Since her last office visit she denies any significant complaints or concerns no infectious signs or symptoms, no issues with the skin.  She will undergo breast reconstruction with placement of tissue expander and Flex HD to the right and removal of tissue expander and placement of implant to the left schedule on 09/28/2002 with Dr. Marla Roe.  Review of Systems General: negative for fever  Physical Exam    09/25/2022   10:39 AM 09/22/2022    8:02 AM 09/18/2022    8:55 AM  Vitals with BMI  Height  '4\' 11"'$  4' 11.5"  Weight  177 lbs 10 oz 181 lbs 6 oz  BMI  09.62 83.66  Systolic 294 765 465  Diastolic 86 70 75  Pulse 035 92 90    General:  No acute distress,  Alert and oriented, Non-Toxic, Normal speech and affect Left expander in place with loss of volume, no overlying redness, flaps are viable incisions clean dry and intact.  Right mastectomy flaps are viable, incision clean dry and intact.  240 cc of injectable saline was placed into the left tissue expander  Assessment/Plan Overall the patient is doing well, she has no signs of infection, the expander was refilled again today.  She will undergo operative intervention on 09/28/2022.  The  patient will return immediately if she develops any new or worsening signs or symptoms.  She verbalized understanding and agreement to today's plan.  Jennifer Davis 09/25/2022, 3:30 PM

## 2022-09-28 ENCOUNTER — Other Ambulatory Visit: Payer: Self-pay

## 2022-09-28 ENCOUNTER — Ambulatory Visit (HOSPITAL_BASED_OUTPATIENT_CLINIC_OR_DEPARTMENT_OTHER): Payer: 59 | Admitting: Certified Registered Nurse Anesthetist

## 2022-09-28 ENCOUNTER — Ambulatory Visit (HOSPITAL_COMMUNITY): Payer: 59 | Admitting: Certified Registered Nurse Anesthetist

## 2022-09-28 ENCOUNTER — Encounter (HOSPITAL_COMMUNITY)
Admission: RE | Disposition: A | Discharge status: Home / Self Care | Payer: Self-pay | Source: Ambulatory Visit | Attending: Plastic Surgery

## 2022-09-28 ENCOUNTER — Ambulatory Visit (HOSPITAL_COMMUNITY)
Admission: RE | Admit: 2022-09-28 | Discharge: 2022-09-28 | Disposition: A | Discharge status: Home / Self Care | Payer: 59 | Source: Ambulatory Visit | Attending: Plastic Surgery | Admitting: Plastic Surgery

## 2022-09-28 ENCOUNTER — Other Ambulatory Visit (HOSPITAL_COMMUNITY): Payer: Self-pay

## 2022-09-28 ENCOUNTER — Encounter (HOSPITAL_COMMUNITY): Payer: Self-pay | Admitting: Plastic Surgery

## 2022-09-28 DIAGNOSIS — Z9013 Acquired absence of bilateral breasts and nipples: Secondary | ICD-10-CM | POA: Diagnosis not present

## 2022-09-28 DIAGNOSIS — Z87891 Personal history of nicotine dependence: Secondary | ICD-10-CM | POA: Diagnosis not present

## 2022-09-28 DIAGNOSIS — E119 Type 2 diabetes mellitus without complications: Secondary | ICD-10-CM | POA: Insufficient documentation

## 2022-09-28 DIAGNOSIS — Z853 Personal history of malignant neoplasm of breast: Secondary | ICD-10-CM | POA: Diagnosis not present

## 2022-09-28 DIAGNOSIS — C50412 Malignant neoplasm of upper-outer quadrant of left female breast: Secondary | ICD-10-CM | POA: Diagnosis not present

## 2022-09-28 DIAGNOSIS — Z421 Encounter for breast reconstruction following mastectomy: Secondary | ICD-10-CM

## 2022-09-28 DIAGNOSIS — T8543XA Leakage of breast prosthesis and implant, initial encounter: Secondary | ICD-10-CM | POA: Diagnosis not present

## 2022-09-28 DIAGNOSIS — Z170 Estrogen receptor positive status [ER+]: Secondary | ICD-10-CM | POA: Diagnosis not present

## 2022-09-28 DIAGNOSIS — J45909 Unspecified asthma, uncomplicated: Secondary | ICD-10-CM | POA: Diagnosis not present

## 2022-09-28 HISTORY — PX: REMOVAL OF TISSUE EXPANDER AND PLACEMENT OF IMPLANT: SHX6457

## 2022-09-28 HISTORY — PX: BREAST RECONSTRUCTION WITH PLACEMENT OF TISSUE EXPANDER AND FLEX HD (ACELLULAR HYDRATED DERMIS): SHX6295

## 2022-09-28 LAB — GLUCOSE, CAPILLARY
Glucose-Capillary: 159 mg/dL — ABNORMAL HIGH (ref 70–99)
Glucose-Capillary: 68 mg/dL — ABNORMAL LOW (ref 70–99)
Glucose-Capillary: 83 mg/dL (ref 70–99)
Glucose-Capillary: 111 mg/dL — ABNORMAL HIGH (ref 70–99)

## 2022-09-28 SURGERY — BREAST RECONSTRUCTION WITH PLACEMENT OF TISSUE EXPANDER AND FLEX HD (ACELLULAR HYDRATED DERMIS)
Anesthesia: General | Site: Breast | Laterality: Right

## 2022-09-28 MED ORDER — DEXTROSE 50 % IV SOLN
INTRAVENOUS | Status: AC
  Administered 2022-09-28: 12.5 g via INTRAVENOUS
  Filled 2022-09-28: qty 50

## 2022-09-28 MED ORDER — CHLORHEXIDINE GLUCONATE 0.12 % MT SOLN
15.0000 mL | Freq: Once | OROMUCOSAL | Status: AC
  Administered 2022-09-28: 15 mL via OROMUCOSAL
  Filled 2022-09-28: qty 15

## 2022-09-28 MED ORDER — DIAZEPAM 2 MG PO TABS
2.0000 mg | ORAL_TABLET | Freq: Three times a day (TID) | ORAL | 0 refills | Status: DC | PRN
  Filled 2022-09-28: qty 30, 10d supply, fill #0

## 2022-09-28 MED ORDER — CHLORHEXIDINE GLUCONATE 0.12 % MT SOLN: 15.0000 mL | Freq: Once | OROMUCOSAL | Status: DC

## 2022-09-28 MED ORDER — PHENYLEPHRINE HCL-NACL 20-0.9 MG/250ML-% IV SOLN
INTRAVENOUS | Status: DC | PRN
  Administered 2022-09-28: 25 ug/min via INTRAVENOUS

## 2022-09-28 MED ORDER — ACETAMINOPHEN 650 MG RE SUPP: 650.0000 mg | RECTAL | Status: DC | PRN

## 2022-09-28 MED ORDER — MIDAZOLAM HCL 2 MG/2ML IJ SOLN
INTRAMUSCULAR | Status: AC
  Filled 2022-09-28: qty 2

## 2022-09-28 MED ORDER — CEFAZOLIN SODIUM-DEXTROSE 2-4 GM/100ML-% IV SOLN
2.0000 g | INTRAVENOUS | Status: AC
  Administered 2022-09-28: 2 g via INTRAVENOUS
  Filled 2022-09-28: qty 100

## 2022-09-28 MED ORDER — LIDOCAINE 2% (20 MG/ML) 5 ML SYRINGE
INTRAMUSCULAR | Status: DC | PRN
  Administered 2022-09-28: 60 mg via INTRAVENOUS

## 2022-09-28 MED ORDER — 0.9 % SODIUM CHLORIDE (POUR BTL) OPTIME
TOPICAL | Status: DC | PRN
  Administered 2022-09-28: 1000 mL

## 2022-09-28 MED ORDER — SODIUM CHLORIDE 0.9 % IV SOLN: 250.0000 mL | INTRAVENOUS | Status: DC | PRN

## 2022-09-28 MED ORDER — LACTATED RINGERS IV SOLN: INTRAVENOUS | Status: DC | PRN

## 2022-09-28 MED ORDER — ACETAMINOPHEN 500 MG PO TABS
1000.0000 mg | ORAL_TABLET | Freq: Once | ORAL | Status: AC
  Administered 2022-09-28: 1000 mg via ORAL
  Filled 2022-09-28: qty 2

## 2022-09-28 MED ORDER — FENTANYL CITRATE (PF) 100 MCG/2ML IJ SOLN
INTRAMUSCULAR | Status: AC
  Filled 2022-09-28: qty 2

## 2022-09-28 MED ORDER — FENTANYL CITRATE (PF) 250 MCG/5ML IJ SOLN
INTRAMUSCULAR | Status: DC | PRN
  Administered 2022-09-28: 50 ug via INTRAVENOUS
  Administered 2022-09-28 (×2): 100 ug via INTRAVENOUS

## 2022-09-28 MED ORDER — HYDROCODONE-ACETAMINOPHEN 5-325 MG PO TABS
1.0000 | ORAL_TABLET | Freq: Three times a day (TID) | ORAL | 0 refills | Status: AC | PRN
  Filled 2022-09-28: qty 21, 7d supply, fill #0

## 2022-09-28 MED ORDER — ROCURONIUM BROMIDE 10 MG/ML (PF) SYRINGE
PREFILLED_SYRINGE | INTRAVENOUS | Status: DC | PRN
  Administered 2022-09-28: 60 mg via INTRAVENOUS
  Administered 2022-09-28: 20 mg via INTRAVENOUS

## 2022-09-28 MED ORDER — HEMOSTATIC AGENTS (NO CHARGE) OPTIME
TOPICAL | Status: DC | PRN
  Administered 2022-09-28 (×2): 1 via TOPICAL

## 2022-09-28 MED ORDER — MIDAZOLAM HCL 2 MG/2ML IJ SOLN
INTRAMUSCULAR | Status: DC | PRN
  Administered 2022-09-28: 2 mg via INTRAVENOUS

## 2022-09-28 MED ORDER — ACETAMINOPHEN 325 MG PO TABS: 650.0000 mg | ORAL_TABLET | ORAL | Status: DC | PRN

## 2022-09-28 MED ORDER — FENTANYL CITRATE (PF) 100 MCG/2ML IJ SOLN
25.0000 ug | INTRAMUSCULAR | Status: DC | PRN
  Administered 2022-09-28: 50 ug via INTRAVENOUS

## 2022-09-28 MED ORDER — DEXAMETHASONE SODIUM PHOSPHATE 10 MG/ML IJ SOLN
INTRAMUSCULAR | Status: AC
  Filled 2022-09-28: qty 1

## 2022-09-28 MED ORDER — ONDANSETRON HCL 4 MG/2ML IJ SOLN
INTRAMUSCULAR | Status: DC | PRN
  Administered 2022-09-28: 4 mg via INTRAVENOUS

## 2022-09-28 MED ORDER — ORAL CARE MOUTH RINSE: 15.0000 mL | Freq: Once | OROMUCOSAL | Status: AC

## 2022-09-28 MED ORDER — LIDOCAINE-EPINEPHRINE 1 %-1:100000 IJ SOLN
INTRAMUSCULAR | Status: DC | PRN
  Administered 2022-09-28: 10 mL

## 2022-09-28 MED ORDER — ORAL CARE MOUTH RINSE: 15.0000 mL | Freq: Once | OROMUCOSAL | Status: DC

## 2022-09-28 MED ORDER — DEXTROSE 50 % IV SOLN: 12.5000 g | INTRAVENOUS | Status: AC

## 2022-09-28 MED ORDER — CHLORHEXIDINE GLUCONATE CLOTH 2 % EX PADS: 6.0000 | MEDICATED_PAD | Freq: Once | CUTANEOUS | Status: DC

## 2022-09-28 MED ORDER — SODIUM CHLORIDE 0.9% FLUSH: 3.0000 mL | Freq: Two times a day (BID) | INTRAVENOUS | Status: DC

## 2022-09-28 MED ORDER — DEXMEDETOMIDINE HCL IN NACL 80 MCG/20ML IV SOLN
INTRAVENOUS | Status: DC | PRN
  Administered 2022-09-28: 12 ug via BUCCAL

## 2022-09-28 MED ORDER — LACTATED RINGERS IV SOLN: INTRAVENOUS | Status: DC

## 2022-09-28 MED ORDER — DEXAMETHASONE SODIUM PHOSPHATE 10 MG/ML IJ SOLN
INTRAMUSCULAR | Status: DC | PRN
  Administered 2022-09-28: 10 mg via INTRAVENOUS

## 2022-09-28 MED ORDER — SODIUM CHLORIDE 0.9% FLUSH: 3.0000 mL | INTRAVENOUS | Status: DC | PRN

## 2022-09-28 MED ORDER — PROPOFOL 10 MG/ML IV BOLUS
INTRAVENOUS | Status: DC | PRN
  Administered 2022-09-28: 160 mg via INTRAVENOUS

## 2022-09-28 MED ORDER — PROPOFOL 10 MG/ML IV BOLUS
INTRAVENOUS | Status: AC
  Filled 2022-09-28: qty 20

## 2022-09-28 MED ORDER — SUGAMMADEX SODIUM 200 MG/2ML IV SOLN
INTRAVENOUS | Status: DC | PRN
  Administered 2022-09-28: 200 mg via INTRAVENOUS

## 2022-09-28 MED ORDER — OXYCODONE HCL 5 MG PO TABS: 5.0000 mg | ORAL_TABLET | ORAL | Status: DC | PRN

## 2022-09-28 MED ORDER — LIDOCAINE-EPINEPHRINE 1 %-1:100000 IJ SOLN
INTRAMUSCULAR | Status: AC
  Filled 2022-09-28: qty 2

## 2022-09-28 MED ORDER — SODIUM CHLORIDE 0.9 % IV SOLN
INTRAVENOUS | Status: DC | PRN
  Administered 2022-09-28: 500 mL

## 2022-09-28 MED ORDER — FENTANYL CITRATE (PF) 250 MCG/5ML IJ SOLN
INTRAMUSCULAR | Status: AC
  Filled 2022-09-28: qty 5

## 2022-09-28 SURGICAL SUPPLY — 55 items
BINDER BREAST LRG (GAUZE/BANDAGES/DRESSINGS) IMPLANT
BINDER BREAST XLRG (GAUZE/BANDAGES/DRESSINGS) IMPLANT
BIOPATCH RED 1 DISK 7.0 (GAUZE/BANDAGES/DRESSINGS) ×4 IMPLANT
CANISTER SUCT 3000ML PPV (MISCELLANEOUS) ×2 IMPLANT
CHLORAPREP W/TINT 26 (MISCELLANEOUS) ×2 IMPLANT
COVER SURGICAL LIGHT HANDLE (MISCELLANEOUS) ×2 IMPLANT
DERMABOND ADVANCED .7 DNX12 (GAUZE/BANDAGES/DRESSINGS) ×2 IMPLANT
DRAIN CHANNEL 19F RND (DRAIN) ×2 IMPLANT
DRAPE HALF SHEET 40X57 (DRAPES) ×2 IMPLANT
DRAPE ORTHO SPLIT 77X108 STRL (DRAPES) ×4
DRAPE SURG 17X23 STRL (DRAPES) ×8 IMPLANT
DRAPE SURG ORHT 6 SPLT 77X108 (DRAPES) ×4 IMPLANT
DRAPE WARM FLUID 44X44 (DRAPES) ×2 IMPLANT
DRSG MEPILEX POST OP 4X8 (GAUZE/BANDAGES/DRESSINGS) IMPLANT
DRSG TEGADERM 4X4.75 (GAUZE/BANDAGES/DRESSINGS) IMPLANT
ELECT BLADE 4.0 EZ CLEAN MEGAD (MISCELLANEOUS) ×2
ELECT REM PT RETURN 9FT ADLT (ELECTROSURGICAL) ×2
ELECTRODE BLDE 4.0 EZ CLN MEGD (MISCELLANEOUS) IMPLANT
ELECTRODE REM PT RTRN 9FT ADLT (ELECTROSURGICAL) ×2 IMPLANT
EVACUATOR SILICONE 100CC (DRAIN) ×2 IMPLANT
FUNNEL KELLER 2 DISP (MISCELLANEOUS) IMPLANT
GAUZE PAD ABD 8X10 STRL (GAUZE/BANDAGES/DRESSINGS) ×4 IMPLANT
GAUZE SPONGE 4X4 12PLY STRL (GAUZE/BANDAGES/DRESSINGS) ×2 IMPLANT
GLOVE BIO SURGEON STRL SZ 6.5 (GLOVE) ×4 IMPLANT
GOWN STRL REUS W/ TWL LRG LVL3 (GOWN DISPOSABLE) ×4 IMPLANT
GOWN STRL REUS W/TWL LRG LVL3 (GOWN DISPOSABLE) ×4
HEMOSTAT ARISTA ABSORB 3G PWDR (HEMOSTASIS) IMPLANT
IMPL EXPANDER BREAST 535CC (Breast) IMPLANT
IMPLANT EXPANDER BREAST 535CC (Breast) ×4 IMPLANT
KIT BASIN OR (CUSTOM PROCEDURE TRAY) ×2 IMPLANT
KIT TURNOVER KIT B (KITS) ×2 IMPLANT
NDL HYPO 25GX1X1/2 BEV (NEEDLE) IMPLANT
NEEDLE HYPO 25GX1X1/2 BEV (NEEDLE) ×2 IMPLANT
NS IRRIG 1000ML POUR BTL (IV SOLUTION) ×4 IMPLANT
PACK GENERAL/GYN (CUSTOM PROCEDURE TRAY) ×2 IMPLANT
PAD ARMBOARD 7.5X6 YLW CONV (MISCELLANEOUS) ×2 IMPLANT
PIN SAFETY STERILE (MISCELLANEOUS) ×2 IMPLANT
SIZER BREAST REUSE 400CC (SIZER) ×2
SIZER BRST REUSE 400CC (SIZER) IMPLANT
STRIP CLOSURE SKIN 1/2X4 (GAUZE/BANDAGES/DRESSINGS) IMPLANT
SUT MNCRL AB 4-0 PS2 18 (SUTURE) ×4 IMPLANT
SUT MON AB 3-0 SH 27 (SUTURE) ×4
SUT MON AB 3-0 SH27 (SUTURE) ×4 IMPLANT
SUT MON AB 5-0 PS2 18 (SUTURE) ×4 IMPLANT
SUT PDS AB 2-0 CT1 27 (SUTURE) ×8 IMPLANT
SUT PDS AB 2-0 CT2 27 (SUTURE) IMPLANT
SUT PDS AB 3-0 SH 27 (SUTURE) IMPLANT
SUT SILK 3 0 SH 30 (SUTURE) IMPLANT
SUT SILK 4 0 PS 2 (SUTURE) ×2 IMPLANT
SUT VIC AB 3-0 SH 18 (SUTURE) IMPLANT
SYR CONTROL 10ML LL (SYRINGE) IMPLANT
TISSUE FLEXHD PERF PLIAB 8X16 (Tissue) IMPLANT
TOWEL GREEN STERILE (TOWEL DISPOSABLE) ×2 IMPLANT
TOWEL GREEN STERILE FF (TOWEL DISPOSABLE) ×2 IMPLANT
TRAY FOLEY MTR SLVR 14FR STAT (SET/KITS/TRAYS/PACK) IMPLANT

## 2022-09-28 NOTE — Anesthesia Postprocedure Evaluation (Signed)
Anesthesia Post Note  Patient: Jennifer Davis  Procedure(s) Performed: BREAST RECONSTRUCTION WITH PLACEMENT OF TISSUE EXPANDER AND FLEX HD (ACELLULAR HYDRATED DERMIS) (Right: Breast) REMOVAL OF TISSUE EXPANDER AND REPLACEMENT OF EXPANDER (Left: Breast)     Patient location during evaluation: PACU Anesthesia Type: General Level of consciousness: awake and alert Pain management: pain level controlled Vital Signs Assessment: post-procedure vital signs reviewed and stable Respiratory status: spontaneous breathing, nonlabored ventilation, respiratory function stable and patient connected to nasal cannula oxygen Cardiovascular status: blood pressure returned to baseline and stable Postop Assessment: no apparent nausea or vomiting Anesthetic complications: no   No notable events documented.  Last Vitals:  Vitals:   09/28/22 1345 09/28/22 1400  BP: 111/66 114/74  Pulse: 75 86  Resp: 11 20  Temp:  36.6 C  SpO2: 96% 94%    Last Pain:  Vitals:   09/28/22 1345  TempSrc:   PainSc: [name]

## 2022-09-28 NOTE — Transfer of Care (Signed)
Immediate Anesthesia Transfer of Care Note  Patient: Jennifer Davis  Procedure(s) Performed: BREAST RECONSTRUCTION WITH PLACEMENT OF TISSUE EXPANDER AND FLEX HD (ACELLULAR HYDRATED DERMIS) (Right: Breast) REMOVAL OF TISSUE EXPANDER AND REPLACEMENT OF EXPANDER (Left: Breast)  Patient Location: PACU  Anesthesia Type:General  Level of Consciousness: awake, alert , oriented, and patient cooperative  Airway & Oxygen Therapy: Patient Spontanous Breathing and Patient connected to nasal cannula oxygen  Post-op Assessment: Report given to RN  Post vital signs: Reviewed and stable  Last Vitals:  Vitals Value Taken Time  BP 110/90 09/28/22 1317  Temp    Pulse 86 09/28/22 1320  Resp 10 09/28/22 1320  SpO2 97 % 09/28/22 1320  Vitals shown include unvalidated device data.  Last Pain:  Vitals:   09/28/22 0932  TempSrc:   PainSc: 5       Patients Stated Pain Goal: 0 (74/12/87 8676)  Complications: No notable events documented.

## 2022-09-28 NOTE — Op Note (Signed)
Op report    DATE OF OPERATION:  09/28/2022  LOCATION: [facility]  SURGICAL DIVISION: Plastic Surgery  PREOPERATIVE DIAGNOSES:  1.  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  Acquired absence bilateral breast  POSTOPERATIVE DIAGNOSES:  Same as preop diagnosis  PROCEDURE:  1.  Bilateral breast reconstruction with  tissue expanders placement 2.  Placement of acellular dermal matrix right breast  SURGEON: Lyndee Leo Sanger Traycen Goyer, DO  ASSISTANT: Lenn Sink, PA  ANESTHESIA:  General.   COMPLICATIONS: None.   IMPLANTS: Left - Mentor 535 cc. Ref #[id number]. 400 cc of injectable saline placed in the expander. Right - Mentor 535 cc. Ref #[id number]. 150 cc of injectable saline placed in the expander. Acellular Dermal Matrix 8 x 16 cm Flex HD in the right breast  INDICATIONS FOR PROCEDURE:  The patient, Jennifer Davis, is a 46 y.o. female born on [birth year], is here for breast\ reconstruction with placement of bilateral tissue expander and Acellular dermal matrix on the right.  Patient had some complications after the first placement of the expanders.  The right side was removed.  The left side recently ruptured so was unable to be expanded any further.  We discussed the options for placement of an implant versus expander.  We will get a try and place an implant if we could get one big enough in place.  If we could not then we will get a go back with the expander on the left side. MRN: [id number]  CONSENT:  Informed consent was obtained directly from the patient. Risks, benefits and alternatives were fully discussed. Specific risks including but not limited to bleeding, infection, hematoma, seroma, scarring, pain, implant infection, implant extrusion, capsular contracture, asymmetry, wound healing problems, and need for further surgery were all discussed. The patient did have an ample opportunity to have her questions answered to her  satisfaction.   DESCRIPTION OF PROCEDURE:  The patient was taken to the operating room by the general surgery team. SCDs were placed and IV antibiotics were given. The patient's chest was prepped and draped in a sterile fashion. A time out was performed and the implants to be used were identified.  Bilateral mastectomies were performed.  Once the general surgery team had completed their portion of the case the patient was rendered to the plastic and reconstructive surgery team.  Left:  The previous incision site was marked.  Local was placed in the lateral aspect.  The #10 blade was used to make an incision.  The skin hooks were used for retraction and the Bovie was used to dissect down to the muscle.  The ADM was well incorporated.  The juncture between the muscle and the ADM was opened and the expander was removed.  The pocket was irrigated.  The pocket was then opened so capsulotomies were done to help with the expansion where the pocket was trying to shrink down.  The pocket was irrigated with antibiotic solution and hemostasis was achieved with electrocautery.  Hemostatic agent was applied to the chest wall.  An implant sizer was placed and did not adequately give the patient form.  The decision was made to place an expander in and continue expansion.  A drain was placed and secured to the chest wall with 3-0 silk.  The 535 cc expander was placed and filled to 400 cc of injectable saline.  The expander was secured to the chest wall with tissue 3-0 PDS.  The pocket was then closed  with 3-0 PDS followed by 3-0 Monocryl.  Right: The skin from the previous incision site was marked and local applied to the lateral aspect.  The #10 blade was used to make an incision.  The skin hooks were used for retraction.  The Bovie was used to dissect down to the muscle.  The ADM had been removed previously.  The pectoralis major muscle was lifted from the chest wall with release of the lateral edge and lateral  inframammary fold.  The pocket was irrigated with antibiotic solution and hemostasis was achieved with hemostatic agent was placed the ADM was then prepared according to the manufacture guidelines.  The ADM was then sutured to the inferior and lateral edge of the inframammary fold with 2-0 PDS starting with an interrupted stitch and then a running stitch.  The lateral portion of the ADM was sutured to the chest wall after the expander was placed.  The expander was prepared according to the manufacture guidelines, the air evacuated and then it was placed under the ADM and pectoralis major muscle.  Hemostatic agent was placed on the chest wall.  The inferior and lateral tabs were used to secure the expander to the chest wall with 2-0 PDS.  The 535 cc expander was selected and 150 cc of injectable saline was placed in the expander.  The drain was placed at the inframammary fold over the ADM and secured to the skin with 3-0 Silk.  The deep layers were closed with 3-0 PDS followed by 3-0 Monocryl.  The skin was closed with 4-0 Monocryl and then dermabond was applied.  The ABDs and breast binder were placed.  The patient tolerated the procedure well and there were no complications.  The patient was allowed to wake from anesthesia and taken to the recovery room in satisfactory condition.   The advanced practice practitioner (APP) assisted throughout the case.  The APP was essential in retraction and counter traction when needed to make the case progress smoothly.  This retraction and assistance made it possible to see the tissue plans for the procedure.  The assistance was needed for blood control, tissue re-approximation and assisted with closure of the incision site.

## 2022-09-28 NOTE — Interval H&P Note (Signed)
History and Physical Interval Note:  09/28/2022 10:28 AM  Jennifer Davis  has presented today for surgery, with the diagnosis of Malignant neoplasm of upper-outer quadrant of left breast in female, estrogen receptor positive.  The various methods of treatment have been discussed with the patient and family. After consideration of risks, benefits and other options for treatment, the patient has consented to  Procedure(s): BREAST RECONSTRUCTION WITH PLACEMENT OF TISSUE EXPANDER AND FLEX HD (ACELLULAR HYDRATED DERMIS) (Right) REMOVAL OF TISSUE EXPANDER AND PLACEMENT OF IMPLANT (Left) as a surgical intervention.  The patient's history has been reviewed, patient examined, no change in status, stable for surgery.  I have reviewed the patient's chart and labs.  Questions were answered to the patient's satisfaction.     Loel Lofty Robbin Escher

## 2022-09-28 NOTE — Interval H&P Note (Signed)
History and Physical Interval Note:  09/28/2022 10:28 AM  Jennifer Davis  has presented today for surgery, with the diagnosis of Malignant neoplasm of upper-outer quadrant of left breast in female, estrogen receptor positive.  The various methods of treatment have been discussed with the patient and family. After consideration of risks, benefits and other options for treatment, the patient has consented to  Procedure(s): BREAST RECONSTRUCTION WITH PLACEMENT OF TISSUE EXPANDER AND FLEX HD (ACELLULAR HYDRATED DERMIS) (Right) REMOVAL OF TISSUE EXPANDER AND PLACEMENT OF IMPLANT (Left) as a surgical intervention.  The patient's history has been reviewed, patient examined, no change in status, stable for surgery.  I have reviewed the patient's chart and labs.  Questions were answered to the patient's satisfaction.     Jennifer Davis

## 2022-09-28 NOTE — Anesthesia Preprocedure Evaluation (Signed)
Anesthesia Evaluation  Patient identified by MRN, date of birth, ID band Patient awake    Reviewed: Allergy & Precautions, NPO status , Patient's Chart, lab work & pertinent test results  Airway Mallampati: II  TM Distance: >3 FB Neck ROM: Full    Dental  (+) Dental Advisory Given   Pulmonary asthma , former smoker   breath sounds clear to auscultation       Cardiovascular negative cardio ROS  Rhythm:Regular Rate:Normal     Neuro/Psych negative neurological ROS     GI/Hepatic Neg liver ROS,GERD  ,,  Endo/Other  diabetes, Type 2    Renal/GU negative Renal ROS     Musculoskeletal  (+) Arthritis ,    Abdominal   Peds  Hematology negative hematology ROS (+)   Anesthesia Other Findings   Reproductive/Obstetrics                              Lab Results  Component Value Date   WBC 7.9 09/22/2022   HGB 13.4 09/22/2022   HCT 41.0 09/22/2022   MCV 90.9 09/22/2022   PLT 276 09/22/2022   Lab Results  Component Value Date   CREATININE 0.72 09/22/2022   BUN 11 09/22/2022   NA 140 09/22/2022   K 4.0 09/22/2022   CL 106 09/22/2022   CO2 24 09/22/2022    Anesthesia Physical Anesthesia Plan  ASA: 2  Anesthesia Plan: General   Post-op Pain Management: Tylenol PO (pre-op)* and Toradol IV (intra-op)*   Induction: Intravenous  PONV Risk Score and Plan: 3 and Dexamethasone, Ondansetron, Midazolam and Treatment may vary due to age or medical condition  Airway Management Planned: Oral ETT  Additional Equipment: None  Intra-op Plan:   Post-operative Plan: Extubation in OR  Informed Consent: I have reviewed the patients History and Physical, chart, labs and discussed the procedure including the risks, benefits and alternatives for the proposed anesthesia with the patient or authorized representative who has indicated his/her understanding and acceptance.     Dental advisory  given  Plan Discussed with: CRNA  Anesthesia Plan Comments:          Anesthesia Quick Evaluation

## 2022-09-28 NOTE — Progress Notes (Signed)
Wasted Fentanyl 64mg with April Cooper, RN as witness.

## 2022-09-28 NOTE — Discharge Instructions (Signed)

## 2022-09-28 NOTE — Anesthesia Procedure Notes (Addendum)
Procedure Name: Intubation Date/Time: 09/28/2022 11:12 AM  Performed by: Michele Rockers, CRNAPre-anesthesia Checklist: Patient identified, Patient being monitored, Timeout performed, Emergency Drugs available and Suction available Patient Re-evaluated:Patient Re-evaluated prior to induction Oxygen Delivery Method: Circle System Utilized Preoxygenation: Pre-oxygenation with 100% oxygen Induction Type: IV induction Ventilation: Mask ventilation without difficulty Laryngoscope Size: Miller and 2 Grade View: Grade I Tube type: Oral Tube size: 7.0 mm Number of attempts: 1 Airway Equipment and Method: Stylet Placement Confirmation: ETT inserted through vocal cords under direct vision, positive ETCO2 and breath sounds checked- equal and bilateral Secured at: 21 cm Tube secured with: Tape Dental Injury: Teeth and Oropharynx as per pre-operative assessment

## 2022-09-29 ENCOUNTER — Encounter (HOSPITAL_COMMUNITY): Payer: Self-pay | Admitting: Plastic Surgery

## 2022-09-30 ENCOUNTER — Telehealth: Payer: Self-pay | Admitting: Student

## 2022-09-30 MED ORDER — DOXYCYCLINE HYCLATE 100 MG PO TABS
100.0000 mg | ORAL_TABLET | Freq: Two times a day (BID) | ORAL | 0 refills | Status: AC
  Filled 2022-09-30: qty 10, 5d supply, fill #0

## 2022-09-30 NOTE — Telephone Encounter (Signed)
Patient called the on service call number for concerns with fever and rash.  Patient reports that she had surgery 2 days ago.  She states that yesterday she had a low-grade fever of 99 F, and today she had a fever of 100.7 F.  She reports that she feels a little nauseous, but has not vomited.  She denies any complaints to the surgical site.  Patient reports she has been taking Tylenol and ibuprofen for pain management.  Patient denies any shortness of breath or lower extremity swelling.  Patient also reports she broke out in a rash to her chest and back today.  She states that she has taken Benadryl for this and she feels like it is improving.  She denies any difficulty breathing, drooling, or the sensation that her throat is closing.    Patient is speaking in full and clear sentences on the phone.  There are no signs of distress based off the patient's speech.  I discussed with the patient that she should monitor her symptoms closely for now.  I discussed with her that if she I discussed with her that she can finish her Keflex tonight, and I will send her doxycycline and to start tomorrow morning to cover her.  I discussed with the patient that if she has any worsening fevers, if she develops any redness over her breast, if she has any worsening pain, or if she has any worsening symptoms that she needs to call us immediately.  Patient expressed understanding.  I recommended to the patient that she take an antihistamine such as Claritin or Zyrtec during the day, and take Benadryl at night.  I advised her to not take Benadryl and oxycodone together as these can be sedating.  I discussed with the patient that if her rash worsens, if she develops any difficulty breathing, if she has any sensation that her throat is closing or she is drooling she needs to go directly to the emergency room.  Patient expressed understanding.  I encouraged the patient to ambulate and to make sure she is drinking plenty of water.   Patient expressed understanding.  I discussed with the patient that I would like her to follow-up on Monday to be evaluated in the clinic.

## 2022-10-02 ENCOUNTER — Other Ambulatory Visit (HOSPITAL_COMMUNITY): Payer: Self-pay

## 2022-10-06 ENCOUNTER — Encounter: Payer: Self-pay | Admitting: Plastic Surgery

## 2022-10-06 ENCOUNTER — Ambulatory Visit (INDEPENDENT_AMBULATORY_CARE_PROVIDER_SITE_OTHER): Payer: 59 | Admitting: Plastic Surgery

## 2022-10-06 VITALS — BP 120/85 | HR 72

## 2022-10-06 DIAGNOSIS — Z9013 Acquired absence of bilateral breasts and nipples: Secondary | ICD-10-CM

## 2022-10-06 NOTE — Progress Notes (Signed)
   Subjective:    Patient ID: Jennifer Davis, female    DOB: 01/19/1976, 46 y.o.   MRN: [id number]  The patient is a 46 year old female here for follow-up after undergoing bilateral placement of expanders on 11/20.  She has 400 cc in the left breast expander and 150 cc in the right breast expander.  Her drain output is minimal.  No sign of hematoma or seroma.  Overall she is doing very well.      Review of Systems  Constitutional: Negative.   HENT: Negative.    Eyes: Negative.   Respiratory: Negative.  Negative for chest tightness and shortness of breath.   Cardiovascular: Negative.   Gastrointestinal: Negative.   Endocrine: Negative.   Genitourinary: Negative.   Musculoskeletal: Negative.        Objective:   Physical Exam Constitutional:      Appearance: Normal appearance.  HENT:     Head: Normocephalic.  Cardiovascular:     Rate and Rhythm: Normal rate.  Pulmonary:     Effort: Pulmonary effort is normal.  Skin:    General: Skin is warm.     Capillary Refill: Capillary refill takes less than 2 seconds.     Coloration: Skin is not jaundiced.     Findings: No bruising.  Neurological:     Mental Status: She is alert and oriented to person, place, and time.  Psychiatric:        Mood and Affect: Mood normal.        Behavior: Behavior normal.        Thought Content: Thought content normal.        Judgment: Judgment normal.        Assessment & Plan:     ICD-10-CM   1. Acquired absence of breast and absent nipple, bilateral  Z90.13       We placed injectable saline in the Expander using a sterile technique: Right: 50 cc for a total of 200 / 535 cc Left: none for a total of 400 / 535  If she is doing well we will plan to remove the drains next week and expand the right side again

## 2022-10-07 ENCOUNTER — Other Ambulatory Visit (HOSPITAL_COMMUNITY): Payer: Self-pay

## 2022-10-07 MED ORDER — FLUCONAZOLE 150 MG PO TABS
150.0000 mg | ORAL_TABLET | ORAL | 0 refills | Status: DC
  Filled 2022-10-07: qty 2, 3d supply, fill #0

## 2022-10-08 ENCOUNTER — Other Ambulatory Visit (HOSPITAL_COMMUNITY): Payer: Self-pay

## 2022-10-09 ENCOUNTER — Encounter: Payer: Self-pay | Admitting: Emergency Medicine

## 2022-10-09 ENCOUNTER — Emergency Department: Payer: 59

## 2022-10-09 ENCOUNTER — Other Ambulatory Visit: Payer: Self-pay

## 2022-10-09 ENCOUNTER — Emergency Department
Admission: EM | Admit: 2022-10-09 | Discharge: 2022-10-09 | Disposition: A | Discharge status: Home / Self Care | Payer: 59 | Attending: Emergency Medicine | Admitting: Emergency Medicine

## 2022-10-09 ENCOUNTER — Telehealth: Payer: Self-pay | Admitting: Plastic Surgery

## 2022-10-09 DIAGNOSIS — Z853 Personal history of malignant neoplasm of breast: Secondary | ICD-10-CM | POA: Insufficient documentation

## 2022-10-09 DIAGNOSIS — R55 Syncope and collapse: Secondary | ICD-10-CM | POA: Diagnosis not present

## 2022-10-09 DIAGNOSIS — R0602 Shortness of breath: Secondary | ICD-10-CM | POA: Diagnosis not present

## 2022-10-09 DIAGNOSIS — J45909 Unspecified asthma, uncomplicated: Secondary | ICD-10-CM | POA: Insufficient documentation

## 2022-10-09 DIAGNOSIS — R42 Dizziness and giddiness: Secondary | ICD-10-CM | POA: Diagnosis not present

## 2022-10-09 LAB — CBC
HCT: 38 % (ref 36.0–46.0)
Hemoglobin: 12.5 g/dL (ref 12.0–15.0)
MCH: 28.4 pg (ref 26.0–34.0)
MCHC: 32.9 g/dL (ref 30.0–36.0)
MCV: 86.4 fL (ref 80.0–100.0)
Platelets: 432 10*3/uL — ABNORMAL HIGH (ref 150–400)
RBC: 4.4 MIL/uL (ref 3.87–5.11)
RDW: 13 % (ref 11.5–15.5)
WBC: 8.8 10*3/uL (ref 4.0–10.5)
nRBC: 0 % (ref 0.0–0.2)

## 2022-10-09 LAB — COMPREHENSIVE METABOLIC PANEL
ALT: 104 U/L — ABNORMAL HIGH (ref 0–44)
AST: 63 U/L — ABNORMAL HIGH (ref 15–41)
Albumin: 2.9 g/dL — ABNORMAL LOW (ref 3.5–5.0)
Alkaline Phosphatase: 137 U/L — ABNORMAL HIGH (ref 38–126)
Anion gap: 7 (ref 5–15)
BUN: 9 mg/dL (ref 6–20)
CO2: 25 mmol/L (ref 22–32)
Calcium: 8.2 mg/dL — ABNORMAL LOW (ref 8.9–10.3)
Chloride: 110 mmol/L (ref 98–111)
Creatinine, Ser: 0.68 mg/dL (ref 0.44–1.00)
GFR, Estimated: >60 mL/min (ref >60)
Glucose, Bld: 99 mg/dL (ref 70–99)
Potassium: 3.9 mmol/L (ref 3.5–5.1)
Sodium: 142 mmol/L (ref 135–145)
Total Bilirubin: 0.4 mg/dL (ref 0.3–1.2)
Total Protein: 6.6 g/dL (ref 6.5–8.1)

## 2022-10-09 LAB — TROPONIN I (HIGH SENSITIVITY): Troponin I (High Sensitivity): 5 ng/L (ref <18)

## 2022-10-09 LAB — CBG MONITORING, ED: Glucose-Capillary: 101 mg/dL — ABNORMAL HIGH (ref 70–99)

## 2022-10-09 MED ORDER — IOHEXOL 350 MG/ML SOLN
75.0000 mL | Freq: Once | INTRAVENOUS | Status: AC | PRN
  Administered 2022-10-09: 75 mL via INTRAVENOUS

## 2022-10-09 MED ORDER — SODIUM CHLORIDE 0.9 % IV SOLN: Freq: Once | INTRAVENOUS | Status: AC

## 2022-10-09 NOTE — Telephone Encounter (Signed)
Pt called to advise that she is in the [facility] for Vasovagal episode and the have done a CAT scan and found that her (R) breast expander is leaking.  Please call her back.

## 2022-10-09 NOTE — ED Provider Notes (Signed)
[facility] Provider Note    Event Date/Time   First MD Initiated Contact with Patient 10/09/22 [phone]     (approximate)   History   Dizziness   HPI  Jennifer Davis is a 46 y.o. female with history of breast cancer who just received breast reconstruction about 2 and half weeks ago who presents with complaints of near syncopal episode.  Patient reports he was in the shower, started to feel dizzy then slightly short of breath thereafter.  She went to get her inhaler because she does have asthma.  No chest pain or palpitations.  No calf pain or swelling.  Feeling improved at this time.     Physical Exam   Triage Vital Signs: ED Triage Vitals  Enc Vitals Group     BP 10/09/22 0757 124/69     Pulse Rate 10/09/22 0757 85     Resp 10/09/22 0757 20     Temp 10/09/22 0757 98.7 F (37.1 C)     Temp src --      SpO2 10/09/22 0757 99 %     Weight 10/09/22 0820 80.2 kg (176 lb 12.9 oz)     Height 10/09/22 0820 1.499 m ('4\' 11"'$ )     Head Circumference --      Peak Flow --      Pain Score 10/09/22 0751 4     Pain Loc --      Pain Edu? --      Excl. in [place]? --     Most recent vital signs: Vitals:   10/09/22 1054 10/09/22 1159  BP:  120/70  Pulse:  80  Resp:  20  Temp: 98.3 F (36.8 C)   SpO2:  99%     General: Awake, no distress.  CV:  Good peripheral perfusion.  Regular rate and rhythm Resp:  Normal effort.  Clear to auscultation bilaterally Abd:  No distention.  Other:  No calf pain or swelling   ED Results / Procedures / Treatments   Labs (all labs ordered are listed, but only abnormal results are displayed) Labs Reviewed  CBC - Abnormal; Notable for the following components:      Result Value   Platelets 432 (*)    All other components within normal limits  COMPREHENSIVE METABOLIC PANEL - Abnormal; Notable for the following components:   Calcium 8.2 (*)    Albumin 2.9 (*)    AST 63 (*)    ALT 104 (*)    Alkaline Phosphatase 137 (*)     All other components within normal limits  CBG MONITORING, ED - Abnormal; Notable for the following components:   Glucose-Capillary 101 (*)    All other components within normal limits  TROPONIN I (HIGH SENSITIVITY)     EKG  ED ECG REPORT I, Lavonia Drafts, the attending physician, personally viewed and interpreted this ECG.  Date: 10/09/2022  Rhythm: normal sinus rhythm QRS Axis: normal Intervals: normal ST/T Wave abnormalities: normal Narrative Interpretation: no evidence of acute ischemia    RADIOLOGY CT angiography, viewed interpreted by me, no pneumonia    PROCEDURES:  Critical Care performed:   Procedures   MEDICATIONS ORDERED IN ED: Medications  0.9 %  sodium chloride infusion ( Intravenous New Bag/Given 10/09/22 0938)  iohexol (OMNIPAQUE) 350 MG/ML injection 75 mL (75 mLs Intravenous Contrast Given 10/09/22 1128)     IMPRESSION / MDM / ASSESSMENT AND PLAN / ED COURSE  I reviewed the triage vital signs and the  nursing notes. Patient's presentation is most consistent with acute presentation with potential threat to life or bodily function.  Patient presents for a near syncopal episode.  Differential includes vasovagal episode however given recent surgery and shortness of breath thereafter question possibility of PE, she is diabetic as well.  EKG is reassuring, will obtain labs, send for CTA and reevaluate.  CT scan is negative for PE however right breast expander appears ruptured, discussed this with patient, she will contact Dr. Marla Roe she is feeling much better, no indication for admission appropriate for discharge at this time, outpatient follow-up with PCP to reevaluate LFTs given mild increase        FINAL CLINICAL IMPRESSION(S) / ED DIAGNOSES   Final diagnoses:  Near syncope     Rx / DC Orders   ED Discharge Orders     None        Note:  This document was prepared using Dragon voice recognition software and may include  unintentional dictation errors.   Lavonia Drafts, MD 10/09/22 1218

## 2022-10-09 NOTE — ED Triage Notes (Signed)
Pt comes with c/o dizziness that started this am. Pt states she fell this am too. Pt also states she had procedure two weeks ago for breast. Pt states drainage has been little dark and some odor.  Pt states she feels like she blacked out earlier.

## 2022-10-09 NOTE — Telephone Encounter (Signed)
I called the patient and spoke with her.  Patient states that today she was in the shower and she became lightheaded and short of breath and fell in the shower.  She states she fell backward.  Patient denies hitting her breast expanders.  Patient reports she went to the ER.  She reports she had a CT done to rule out PE, which was negative for PE.  She states that the CT scan did show that the right expander was possibly ruptured.  Patient was calling to let us know of the results.  She reports her right drain put out approximately 70 cc in the past 24 hours.  She states that she feels the expanders appear to look the same as they have since her last appointment.  Per CT report: "Partially collapsed right breast expander with a moderate amount of fluid seen surrounding the right breast tissue expander". "Evidence of recent breast reconstruction with findings concerning for right breast tissue expander rupture"  Patient was last seen in the clinic on 10/06/2022.  At this visit, she was doing well.  Her drain output was minimal.  There is no sign of hematoma or seroma.  Injectable saline was placed on the right expander, 50 cc for a total of 200 cc / 535 cc.  There was no injectable saline placed in the left expander, there is a total of 400 cc / 535 cc in the left expander.  I discussed with the patient that she should follow-up in the clinic early next week for evaluation.  Patient reports she has an appointment scheduled on Monday already.  I told her she can keep this appointment.   I discussed with the patient that I would like her to monitor her right breast implant.  I discussed with her that I would like her to let us know if she feels like there is volume loss on Monday at her appointment. Patient expressed understanding.  I instructed the patient in the meantime to call us if she has any questions or concerns.

## 2022-10-12 ENCOUNTER — Ambulatory Visit (INDEPENDENT_AMBULATORY_CARE_PROVIDER_SITE_OTHER): Payer: 59 | Admitting: Physician Assistant

## 2022-10-12 DIAGNOSIS — Z9013 Acquired absence of bilateral breasts and nipples: Secondary | ICD-10-CM

## 2022-10-12 DIAGNOSIS — F4312 Post-traumatic stress disorder, chronic: Secondary | ICD-10-CM | POA: Diagnosis not present

## 2022-10-12 NOTE — Progress Notes (Signed)
This is a 46 year old female who is status post bilateral placement of expanders on 09/28/2022 by Dr. Marla Roe.  She had 400 cc placed in the left expander and 150 cc placed in the right expander.  She was last seen in the clinic on 10/06/2022.  Since her last office visit she notes she has been doing well without significant complaints or concerns.  She note less less than 25 cc output from the bilateral drains since she changed to yesterday evening.  She denies any fever, swelling or warmth.  Chaperone present.  Bilateral expanders in place, no overlying redness, bilateral mastectomy flaps are viable with good cap refill.  Dressing removed, Steri-Strips in place.  JP drains with minimal serosanguineous output.  No palpable fluid collections, no warmth to touch.   Injectable saline was placed into bilateral tissue expanders    Right: 50cc for a total of 250/535cc Left: 50cc cc for a total of 400/535 cc  The patient tolerated this well without any issues  Overall the patient is doing very well.  Her drains were removed today.  She tolerated this well.  She has no signs of infection.  I like to see her back in 1 week for repeat evaluation and expander fill.  She was given strict return precautions.  She verbalized understanding and agreement to today's plan.

## 2022-10-15 ENCOUNTER — Telehealth: Payer: 59 | Admitting: Family Medicine

## 2022-10-15 ENCOUNTER — Other Ambulatory Visit (HOSPITAL_COMMUNITY): Payer: Self-pay

## 2022-10-15 DIAGNOSIS — J208 Acute bronchitis due to other specified organisms: Secondary | ICD-10-CM

## 2022-10-15 MED ORDER — PREDNISONE 20 MG PO TABS
40.0000 mg | ORAL_TABLET | Freq: Every day | ORAL | 0 refills | Status: AC
  Filled 2022-10-15: qty 10, 5d supply, fill #0

## 2022-10-15 MED ORDER — PROMETHAZINE-DM 6.25-15 MG/5ML PO SYRP
5.0000 mL | ORAL_SOLUTION | Freq: Four times a day (QID) | ORAL | 0 refills | Status: DC | PRN
  Filled 2022-10-15: qty 118, 6d supply, fill #0

## 2022-10-15 MED ORDER — FLUTICASONE PROPIONATE 50 MCG/ACT NA SUSP
2.0000 | Freq: Every day | NASAL | 0 refills | Status: DC
  Filled 2022-10-15: qty 16, 30d supply, fill #0

## 2022-10-15 NOTE — Patient Instructions (Addendum)
[name], thank you for joining [name], NP for today's virtual visit.  While this provider is not your primary care provider (PCP), if your PCP is located in our provider database this encounter information will be shared with them immediately following your visit.   [facility] account gives you access to today's visit and all your visits, tests, and labs performed at [facility] " click here if you don't have a [facility] account or go to [email or web address]  Consent: (Patient) RONIA HAZELETT provided verbal consent for this virtual visit at the beginning of the encounter.  Current Medications:  Current Outpatient Medications:    albuterol (PROAIR HFA) 108 (90 Base) MCG/ACT inhaler, Inhale 2 puffs into the lungs every 4-6 hours as needed., Disp: 6.7 g, Rfl: 0   albuterol (VENTOLIN HFA) 108 (90 Base) MCG/ACT inhaler, Inhale 2 puffs into the lungs every 4-6 hours as needed., Disp: 8.5 g, Rfl: 0   budesonide-formoterol (SYMBICORT) 160-4.5 MCG/ACT inhaler, Inhale 2 puffs into the lungs 2 (two) times daily., Disp: 10.2 g, Rfl: 5   budesonide-formoterol (SYMBICORT) 160-4.5 MCG/ACT inhaler, Inhale 2 puffs into the lungs 2 (two) times daily., Disp: 10.2 g, Rfl: 5   cetirizine (ZYRTEC) 10 MG tablet, Take 10 mg by mouth daily., Disp: , Rfl:    Cholecalciferol (VITAMIN D) 50 MCG (2000 UT) tablet, Take 1 tablet (2,000 Units total) by mouth daily., Disp: , Rfl:    diazepam (VALIUM) 2 MG tablet, Take 1 tablet (2 mg total) by mouth every 8 (eight) hours as needed for anxiety., Disp: 30 tablet, Rfl: 0   diphenhydrAMINE (BENADRYL) 25 MG tablet, Take 50 mg by mouth every 6 (six) hours as needed (hives)., Disp: , Rfl:    EPINEPHrine (EPIPEN 2-PAK) 0.3 mg/0.3 mL IJ SOAJ injection, Inject 0.3 mLs (0.3 mg total) into the muscle once as needed for up to 1 dose (for severe allergic reaction). CAll 911 immediately if you have to use this medicine, Disp: 1 each, Rfl: 1    EPINEPHrine 0.3 mg/0.3 mL IJ SOAJ injection, Inject 0.3 mg into the muscle as needed for anaphylaxis., Disp: 1 each, Rfl: 0   EPINEPHrine 0.3 mg/0.3 mL IJ SOAJ injection, Use as directed for anaphylaxis, Disp: 2 each, Rfl: 1   escitalopram (LEXAPRO) 20 MG tablet, Take 1 tablet (20 mg total) by mouth daily., Disp: 90 tablet, Rfl: 0   famotidine (PEPCID) 20 MG tablet, Take 1 tablet (20 mg total) by mouth daily., Disp: 7 tablet, Rfl: 0   fluconazole (DIFLUCAN) 150 MG tablet, Take 1 tablet (150 mg total) by mouth now and repeat in 3 days, Disp: 2 tablet, Rfl: 0   gabapentin (NEURONTIN) 100 MG capsule, Take 1 capsule (100 mg total) by mouth 3 (three) times daily., Disp: 270 capsule, Rfl: 0   ibuprofen (ADVIL) 200 MG tablet, Take 400 mg by mouth every 6 (six) hours as needed for moderate pain., Disp: , Rfl:    melatonin 5 MG TABS, Take 5 mg by mouth at bedtime as needed (sleep)., Disp: , Rfl:    montelukast (SINGULAIR) 10 MG tablet, Take 1 tablet (10 mg total) by mouth every evening., Disp: 30 tablet, Rfl: 5   montelukast (SINGULAIR) 10 MG tablet, Take 1 tablet (10 mg total) by mouth every evening., Disp: 30 tablet, Rfl: 5   nystatin ointment (MYCOSTATIN), Apply 1 application externally 2 (two) times daily for 14 days, Disp: 30 g, Rfl: 2   Olopatadine HCl (PATADAY OP),  Place 1 drop into both eyes daily as needed (allergies)., Disp: , Rfl:    Olopatadine HCl 0.6 % SOLN, Place 2 sprays into both nostrils 2 (two) times daily., Disp: 30.5 g, Rfl: 5   ondansetron (ZOFRAN) 4 MG tablet, Take 1 tablet (4 mg total) by mouth every 8 (eight) hours as needed for up to 20 doses for nausea or vomiting., Disp: 20 tablet, Rfl: 0   oxyCODONE (ROXICODONE) 5 MG immediate release tablet, Take 1 tablet (5 mg total) by mouth every 8 (eight) hours as needed for up to 20 doses for severe pain., Disp: 20 tablet, Rfl: 0   Probiotic Product ([employer] PROBIOTIC PO), Take 1 capsule by mouth daily., Disp: , Rfl:    Semaglutide, 1 MG/DOSE,  (OZEMPIC, 1 MG/DOSE,) 4 MG/3ML SOPN, Inject 1 mg into the skin once a week., Disp: 3 mL, Rfl: 0   Semaglutide-Weight Management (WEGOVY) 1 MG/0.5ML SOAJ, Inject 1 mg into the skin once a week., Disp: 2 mL, Rfl: 2   Semaglutide-Weight Management (WEGOVY) 1 MG/0.5ML SOAJ, Inject 1 mg into the skin once a week., Disp: 2 mL, Rfl: 2   Semaglutide-Weight Management (WEGOVY) 1 MG/0.5ML SOAJ, Inject 1 mg into the skin once a week., Disp: 2 mL, Rfl: 1   tamoxifen (NOLVADEX) 10 MG tablet, Take 1 tablet (10 mg total) by mouth daily., Disp: 90 tablet, Rfl: 3   triamcinolone lotion (KENALOG) 0.1 %, Apply 1 Application topically 2 (two) times daily for 10 days., Disp: 60 mL, Rfl: 0   Medications ordered in this encounter:  No orders of the defined types were placed in this encounter.    *If you need refills on other medications prior to your next appointment, please contact your pharmacy*  Follow-Up: Call back or seek an in-person evaluation if the symptoms worsen or if the condition fails to improve as anticipated.  [facility] [phone]  Other Instructions  Acute Bronchitis, Adult  Acute bronchitis is when air tubes in the lungs (bronchi) suddenly get swollen. The condition can make it hard for you to breathe. In adults, acute bronchitis usually goes away within 2 weeks. A cough caused by bronchitis may last up to 3 weeks. Smoking, allergies, and asthma can make the condition worse. What are the causes? Germs that cause cold and flu (viruses). The most common cause of this condition is the virus that causes the common cold. Bacteria. Substances that bother (irritate) the lungs, including: Smoke from cigarettes and other types of tobacco. Dust and pollen. Fumes from chemicals, gases, or burned fuel. Indoor or outdoor air pollution. What increases the risk? A weak body's defense system. This is also called the immune system. Any condition that affects your lungs and breathing,  such as asthma. What are the signs or symptoms? A cough. Coughing up clear, yellow, or green mucus. Making high-pitched whistling sounds when you breathe, most often when you breathe out (wheezing). Runny or stuffy nose. Having too much mucus in your lungs (chest congestion). Shortness of breath. Body aches. A sore throat. How is this treated? Acute bronchitis may go away over time without treatment. Your doctor may tell you to: Drink more fluids. This will help thin your mucus so it is easier to cough up. Use a device that gets medicine into your lungs (inhaler). Use a vaporizer or a humidifier. These are machines that add water to the air. This helps with coughing and poor breathing. Take a medicine that thins mucus and helps clear it  from your lungs. Take a medicine that prevents or stops coughing. It is not common to take an antibiotic medicine for this condition. Follow these instructions at home:  Take over-the-counter and prescription medicines only as told by your doctor. Use an inhaler, vaporizer, or humidifier as told by your doctor. Take two teaspoons (10 mL) of honey at bedtime. This helps lessen your coughing at night. Drink enough fluid to keep your pee (urine) pale yellow. Do not smoke or use any products that contain nicotine or tobacco. If you need help quitting, ask your doctor. Get a lot of rest. Return to your normal activities when your doctor says that it is safe. Keep all follow-up visits. How is this prevented?  Wash your hands often with soap and water for at least 20 seconds. If you cannot use soap and water, use hand sanitizer. Avoid contact with people who have cold symptoms. Try not to touch your mouth, nose, or eyes with your hands. Avoid breathing in smoke or chemical fumes. Make sure to get the flu shot every year. Contact a doctor if: Your symptoms do not get better in 2 weeks. You have trouble coughing up the mucus. Your cough keeps you awake at  night. You have a fever. Get help right away if: You cough up blood. You have chest pain. You have very bad shortness of breath. You faint or keep feeling like you are going to faint. You have a very bad headache. Your fever or chills get worse. These symptoms may be an emergency. Get help right away. Call your local emergency services (911 in the [place] Do not wait to see if the symptoms will go away. Do not drive yourself to the hospital. Summary Acute bronchitis is when air tubes in the lungs (bronchi) suddenly get swollen. In adults, acute bronchitis usually goes away within 2 weeks. Drink more fluids. This will help thin your mucus so it is easier to cough up. Take over-the-counter and prescription medicines only as told by your doctor. Contact a doctor if your symptoms do not improve after 2 weeks of treatment. This information is not intended to replace advice given to you by your health care provider. Make sure you discuss any questions you have with your health care provider. Document Revised: 02/26/2021 Document Reviewed: 02/26/2021 Elsevier Patient Education  [facility] [place].    If you have been instructed to have an in-person evaluation today at a local Urgent Care facility, please use the link below. It will take you to a list of all of our available [facility], including address, phone number and hours of operation. Please do not delay care.  [facility]  If you or a family member do not have a primary care provider, use the link below to schedule a visit and establish care. When you choose a [facility] primary care physician or advanced practice provider, you gain a long-term partner in health. Find a Primary Care Provider  Learn more about [facility]'s in-office and virtual care options: [facility]

## 2022-10-15 NOTE — Progress Notes (Signed)
Virtual Visit Consent   Jennifer Davis, you are scheduled for a virtual visit with a [facility] provider today. Just as with appointments in the office, your consent must be obtained to participate. Your consent will be active for this visit and any virtual visit you may have with one of our providers in the next 365 days. If you have a MyChart account, a copy of this consent can be sent to you electronically.  As this is a virtual visit, video technology does not allow for your provider to perform a traditional examination. This may limit your provider's ability to fully assess your condition. If your provider identifies any concerns that need to be evaluated in person or the need to arrange testing (such as labs, EKG, etc.), we will make arrangements to do so. Although advances in technology are sophisticated, we cannot ensure that it will always work on either your end or our end. If the connection with a video visit is poor, the visit may have to be switched to a telephone visit. With either a video or telephone visit, we are not always able to ensure that we have a secure connection.  By engaging in this virtual visit, you consent to the provision of healthcare and authorize for your insurance to be billed (if applicable) for the services provided during this visit. Depending on your insurance coverage, you may receive a charge related to this service.  I need to obtain your verbal consent now. Are you willing to proceed with your visit today? [name] has provided verbal consent on 10/15/2022 for a virtual visit (video or telephone). [name], NP  Date: 10/15/2022 3:46 PM  Virtual Visit via Video Note   I, [name], connected with  Jennifer Davis  ([id number], June 21, 1976) on 10/15/22 at  3:45 PM EST by a video-enabled telemedicine application and verified that I am speaking with the correct person using two identifiers.  Location: Patient: Virtual Visit Location Patient:  Home Provider: Virtual Visit Location Provider: Home Office   I discussed the limitations of evaluation and management by telemedicine and the availability of in person appointments. The patient expressed understanding and agreed to proceed.    History of Present Illness: Jennifer Davis is a 46 y.o. who identifies as a female who was assigned female at birth, and is being seen today for sinus pressure and URI symptoms around 3 days ago, coughing-shortness of breath, burning throat, phlegm, fever of 101- today is 99.8, URI, losing voice. History of asthma. Home test for covid negative.  Denies chest pain.  Problems:  Patient Active Problem List   Diagnosis Date Noted   Acquired absence of breast and absent nipple, bilateral 06/02/2022   Vitamin D deficiency 09/26/2021   Allergic rhinitis 09/26/2021   Arthritis of hip 09/26/2021   Port-A-Cath in place 01/22/2021   S/P mastectomy, bilateral 12/12/2020   Breast cancer ([place]) 12/04/2020   Genetic testing 10/02/2020   Malignant neoplasm of upper-outer quadrant of left breast in female, estrogen receptor positive ([place]) 09/19/2020   Depression 08/06/2018   PTSD (post-traumatic stress disorder) 08/06/2018   Anxiety 01/09/2016   Asthma 01/09/2016   Morbid obesity ([place]) 01/09/2016    Allergies:  Allergies  Allergen Reactions   Bee Venom Hives    wheezy   Other Itching    environmental allergies sometime activate asthma   Medications:  Current Outpatient Medications:    albuterol (PROAIR HFA) 108 (90 Base) MCG/ACT inhaler, Inhale 2 puffs  into the lungs every 4-6 hours as needed., Disp: 6.7 g, Rfl: 0   albuterol (VENTOLIN HFA) 108 (90 Base) MCG/ACT inhaler, Inhale 2 puffs into the lungs every 4-6 hours as needed., Disp: 8.5 g, Rfl: 0   budesonide-formoterol (SYMBICORT) 160-4.5 MCG/ACT inhaler, Inhale 2 puffs into the lungs 2 (two) times daily., Disp: 10.2 g, Rfl: 5   budesonide-formoterol (SYMBICORT) 160-4.5 MCG/ACT inhaler, Inhale 2 puffs  into the lungs 2 (two) times daily., Disp: 10.2 g, Rfl: 5   cetirizine (ZYRTEC) 10 MG tablet, Take 10 mg by mouth daily., Disp: , Rfl:    Cholecalciferol (VITAMIN D) 50 MCG (2000 UT) tablet, Take 1 tablet (2,000 Units total) by mouth daily., Disp: , Rfl:    diazepam (VALIUM) 2 MG tablet, Take 1 tablet (2 mg total) by mouth every 8 (eight) hours as needed for anxiety., Disp: 30 tablet, Rfl: 0   diphenhydrAMINE (BENADRYL) 25 MG tablet, Take 50 mg by mouth every 6 (six) hours as needed (hives)., Disp: , Rfl:    EPINEPHrine (EPIPEN 2-PAK) 0.3 mg/0.3 mL IJ SOAJ injection, Inject 0.3 mLs (0.3 mg total) into the muscle once as needed for up to 1 dose (for severe allergic reaction). CAll 911 immediately if you have to use this medicine, Disp: 1 each, Rfl: 1   EPINEPHrine 0.3 mg/0.3 mL IJ SOAJ injection, Inject 0.3 mg into the muscle as needed for anaphylaxis., Disp: 1 each, Rfl: 0   EPINEPHrine 0.3 mg/0.3 mL IJ SOAJ injection, Use as directed for anaphylaxis, Disp: 2 each, Rfl: 1   escitalopram (LEXAPRO) 20 MG tablet, Take 1 tablet (20 mg total) by mouth daily., Disp: 90 tablet, Rfl: 0   famotidine (PEPCID) 20 MG tablet, Take 1 tablet (20 mg total) by mouth daily., Disp: 7 tablet, Rfl: 0   fluconazole (DIFLUCAN) 150 MG tablet, Take 1 tablet (150 mg total) by mouth now and repeat in 3 days, Disp: 2 tablet, Rfl: 0   gabapentin (NEURONTIN) 100 MG capsule, Take 1 capsule (100 mg total) by mouth 3 (three) times daily., Disp: 270 capsule, Rfl: 0   ibuprofen (ADVIL) 200 MG tablet, Take 400 mg by mouth every 6 (six) hours as needed for moderate pain., Disp: , Rfl:    melatonin 5 MG TABS, Take 5 mg by mouth at bedtime as needed (sleep)., Disp: , Rfl:    montelukast (SINGULAIR) 10 MG tablet, Take 1 tablet (10 mg total) by mouth every evening., Disp: 30 tablet, Rfl: 5   montelukast (SINGULAIR) 10 MG tablet, Take 1 tablet (10 mg total) by mouth every evening., Disp: 30 tablet, Rfl: 5   nystatin ointment (MYCOSTATIN),  Apply 1 application externally 2 (two) times daily for 14 days, Disp: 30 g, Rfl: 2   Olopatadine HCl (PATADAY OP), Place 1 drop into both eyes daily as needed (allergies)., Disp: , Rfl:    Olopatadine HCl 0.6 % SOLN, Place 2 sprays into both nostrils 2 (two) times daily., Disp: 30.5 g, Rfl: 5   ondansetron (ZOFRAN) 4 MG tablet, Take 1 tablet (4 mg total) by mouth every 8 (eight) hours as needed for up to 20 doses for nausea or vomiting., Disp: 20 tablet, Rfl: 0   oxyCODONE (ROXICODONE) 5 MG immediate release tablet, Take 1 tablet (5 mg total) by mouth every 8 (eight) hours as needed for up to 20 doses for severe pain., Disp: 20 tablet, Rfl: 0   predniSONE (DELTASONE) 50 MG tablet, 1 tablet PO daily, Disp: 4 tablet, Rfl: 0   Probiotic  Product ([employer] PROBIOTIC PO), Take 1 capsule by mouth daily., Disp: , Rfl:    Semaglutide, 1 MG/DOSE, (OZEMPIC, 1 MG/DOSE,) 4 MG/3ML SOPN, Inject 1 mg into the skin once a week., Disp: 3 mL, Rfl: 0   Semaglutide-Weight Management (WEGOVY) 1 MG/0.5ML SOAJ, Inject 1 mg into the skin once a week., Disp: 2 mL, Rfl: 2   Semaglutide-Weight Management (WEGOVY) 1 MG/0.5ML SOAJ, Inject 1 mg into the skin once a week., Disp: 2 mL, Rfl: 2   Semaglutide-Weight Management (WEGOVY) 1 MG/0.5ML SOAJ, Inject 1 mg into the skin once a week., Disp: 2 mL, Rfl: 1   tamoxifen (NOLVADEX) 10 MG tablet, Take 1 tablet (10 mg total) by mouth daily., Disp: 90 tablet, Rfl: 3   triamcinolone lotion (KENALOG) 0.1 %, Apply 1 Application topically 2 (two) times daily for 10 days., Disp: 60 mL, Rfl: 0  Observations/Objective: Patient is well-developed, well-nourished in no acute distress.  Resting comfortably  at home.  Head is normocephalic, atraumatic.  No labored breathing.  Speech is clear and coherent with logical content.  Hoarseness and cough present Patient is alert and oriented at baseline.    Assessment and Plan:  1. Viral bronchitis  - predniSONE (DELTASONE) 20 MG tablet; Take 2  tablets (40 mg total) by mouth daily with breakfast for 5 days.  Dispense: 10 tablet; Refill: 0 - fluticasone (FLONASE) 50 MCG/ACT nasal spray; Place 2 sprays into both nostrils daily.  Dispense: 16 g; Refill: 0 - promethazine-dextromethorphan (PROMETHAZINE-DM) 6.25-15 MG/5ML syrup; Take 5 mLs by mouth 4 (four) times daily as needed for cough.  Dispense: 118 mL; Refill: 0   -rest -hydrate -warm fluids -if not improved in the next 5-7 days please follow up in person -flonase daily   -Use a cool mist humidifier especially during the winter months when heat dries out the air. - Use saline nose sprays frequently to help soothe nasal passages and promote drainage. -Saline irrigations of the nose can be very helpful if done frequently.             * 4X daily for 1 week*             * Use of a nettie pot can be helpful with this.  *Follow directions with this* *Boiled or distilled water only -stay hydrated by drinking plenty of fluids - Keep thermostat turn down low to prevent drying out sinuses - For any cough or congestion- robitussin DM or Delsym as needed - For fever or aches or pains- take tylenol or ibuprofen as directed on bottle             * for fevers greater than 101 orally you may alternate ibuprofen and tylenol every 3 hours.      Follow Up Instructions: I discussed the assessment and treatment plan with the patient. The patient was provided an opportunity to ask questions and all were answered. The patient agreed with the plan and demonstrated an understanding of the instructions.  A copy of instructions were sent to the patient via MyChart unless otherwise noted below.     The patient was advised to call back or seek an in-person evaluation if the symptoms worsen or if the condition fails to improve as anticipated.  Time:  I spent 10 minutes with the patient via telehealth technology discussing the above problems/concerns.    [name], NP

## 2022-10-19 ENCOUNTER — Other Ambulatory Visit (HOSPITAL_COMMUNITY): Payer: Self-pay

## 2022-10-19 DIAGNOSIS — F418 Other specified anxiety disorders: Secondary | ICD-10-CM | POA: Diagnosis not present

## 2022-10-19 DIAGNOSIS — E669 Obesity, unspecified: Secondary | ICD-10-CM | POA: Diagnosis not present

## 2022-10-19 DIAGNOSIS — R632 Polyphagia: Secondary | ICD-10-CM | POA: Diagnosis not present

## 2022-10-19 DIAGNOSIS — F4312 Post-traumatic stress disorder, chronic: Secondary | ICD-10-CM | POA: Diagnosis not present

## 2022-10-19 DIAGNOSIS — Z79899 Other long term (current) drug therapy: Secondary | ICD-10-CM | POA: Diagnosis not present

## 2022-10-19 DIAGNOSIS — Z6832 Body mass index (BMI) 32.0-32.9, adult: Secondary | ICD-10-CM | POA: Diagnosis not present

## 2022-10-20 ENCOUNTER — Other Ambulatory Visit (HOSPITAL_COMMUNITY): Payer: Self-pay

## 2022-10-20 ENCOUNTER — Ambulatory Visit (INDEPENDENT_AMBULATORY_CARE_PROVIDER_SITE_OTHER): Payer: 59 | Admitting: Surgical

## 2022-10-20 DIAGNOSIS — Z9013 Acquired absence of bilateral breasts and nipples: Secondary | ICD-10-CM

## 2022-10-20 MED ORDER — ZEPBOUND 5 MG/0.5ML ~~LOC~~ SOAJ
5.0000 mg | SUBCUTANEOUS | 0 refills | Status: DC
  Filled 2022-10-20 – 2022-10-30 (×2): qty 6, 84d supply, fill #0

## 2022-10-20 NOTE — Progress Notes (Signed)
Patient is a 46 year old female here for follow-up on her bilateral breast reconstruction.  She most recently underwent breast reconstruction with placement of tissue expanders bilaterally.  The left breast implant was exchanged for a new expander due to a leak.  Of note patient did undergo radiation therapy to the left breast from 07/09/2021 until 08/22/2021.  She reports she is overall doing well, she is working on weight loss and has lost about 8 pounds in the last month.  She feels much better, she unfortunately is dealing with some bronchitis right now.  She is not having any infectious symptoms.  She tolerated her last fill well.  Chaperone present on exam On exam bilateral breast incisions are intact and healing well.  There is no erythema or cellulitic changes noted.  She does have some radiation skin changes to the left breast.  There is no subcutaneous fluid collection noted of either breast.  A/P:  Patient is doing well, no signs of infection on exam.  We discussed the reconstructive plan moving forward.  All of her questions were answered to her content.  We will plan to see her back in 2 weeks for an additional fill and to reevaluate.  We will continue to expand the right side for improved symmetry, may or may not do a fill at the next appointment pending the skin tightness to allow the radiated skin to expand more slowly.  We placed injectable saline in the Expander using a sterile technique: Right: 50 cc for a total of 300 / 535 cc Left: 50 cc for a total of 450 / 535 cc

## 2022-10-22 ENCOUNTER — Other Ambulatory Visit (HOSPITAL_COMMUNITY): Payer: Self-pay

## 2022-10-26 ENCOUNTER — Other Ambulatory Visit (HOSPITAL_COMMUNITY): Payer: Self-pay

## 2022-10-26 DIAGNOSIS — F4312 Post-traumatic stress disorder, chronic: Secondary | ICD-10-CM | POA: Diagnosis not present

## 2022-10-27 ENCOUNTER — Other Ambulatory Visit: Payer: Self-pay

## 2022-10-30 ENCOUNTER — Other Ambulatory Visit: Payer: Self-pay | Admitting: Physician Assistant

## 2022-11-03 ENCOUNTER — Other Ambulatory Visit (HOSPITAL_COMMUNITY): Payer: Self-pay

## 2022-11-03 ENCOUNTER — Encounter: Payer: 59 | Admitting: Surgical

## 2022-11-04 DIAGNOSIS — F4312 Post-traumatic stress disorder, chronic: Secondary | ICD-10-CM | POA: Diagnosis not present

## 2022-11-05 NOTE — Telephone Encounter (Signed)
Has an appt with Teresa tomorrow.  

## 2022-11-06 ENCOUNTER — Ambulatory Visit: Payer: 59 | Admitting: Physician Assistant

## 2022-11-06 ENCOUNTER — Other Ambulatory Visit (HOSPITAL_COMMUNITY): Payer: Self-pay

## 2022-11-06 ENCOUNTER — Encounter: Payer: Self-pay | Admitting: Physician Assistant

## 2022-11-06 DIAGNOSIS — F4323 Adjustment disorder with mixed anxiety and depressed mood: Secondary | ICD-10-CM

## 2022-11-06 DIAGNOSIS — R4584 Anhedonia: Secondary | ICD-10-CM | POA: Diagnosis not present

## 2022-11-06 DIAGNOSIS — F411 Generalized anxiety disorder: Secondary | ICD-10-CM

## 2022-11-06 DIAGNOSIS — Z630 Problems in relationship with spouse or partner: Secondary | ICD-10-CM

## 2022-11-06 MED ORDER — ESCITALOPRAM OXALATE 20 MG PO TABS
20.0000 mg | ORAL_TABLET | Freq: Every day | ORAL | 0 refills | Status: DC
  Filled 2022-11-06: qty 30, 30d supply, fill #0

## 2022-11-06 NOTE — Progress Notes (Signed)
[facility]  Patient ID: Jennifer Davis,  MRN: [id number]  PCP: Wenda Low, MD  Date of Evaluation: 11/06/2022 Time spent:30 minutes   Chief Complaint:  Chief Complaint   Anxiety; Depression; Follow-up    HISTORY/CURRENT STATUS: HPI For routine med check.   She had breast reconstruction in November.  That was stressful, plus some marital discord which has been hard as well.  She started seeing a counselor which has been helpful but she feels down, has a hard time enjoying things that she used to enjoy.  Energy and motivation are fair to good depending on the day.  Appetite is normal and weight is stable.  ADLs and personal hygiene are normal.  She sleeps well most of the time.  Work is going well.  Does not cry easily now but she did back in November.  States Cymbalta helped more than the Lexapro is with the anhedonia for sure and she did not feel emotionally flattened when taking that.  It was changed due to her being on tamoxifen.  Does not have anxiety very often.  No suicidal or homicidal thoughts.  Patient denies increased energy with decreased need for sleep, increased talkativeness, racing thoughts, impulsivity or risky behaviors, increased spending, increased libido, grandiosity, increased irritability or anger, paranoia, or hallucinations.  Denies dizziness, syncope, seizures, numbness, tingling, tremor, tics, unsteady gait, slurred speech, confusion.  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  Denies muscle stiffness or dystonia.  Individual Medical History/ Review of Systems: Changes? :Yes see HPI  Past medications for mental health diagnoses include: Lexapro, Prozac wasn't effective, Xanax, Buspar worked but d/c when didn't need it.  Allergies: Bee venom and Other  Current Medications:  Current Outpatient Medications:    albuterol (PROAIR HFA) 108 (90 Base) MCG/ACT inhaler, Inhale 2 puffs into the lungs  every 4-6 hours as needed., Disp: 6.7 g, Rfl: 0   albuterol (VENTOLIN HFA) 108 (90 Base) MCG/ACT inhaler, Inhale 2 puffs into the lungs every 4-6 hours as needed., Disp: 8.5 g, Rfl: 0   budesonide-formoterol (SYMBICORT) 160-4.5 MCG/ACT inhaler, Inhale 2 puffs into the lungs 2 (two) times daily., Disp: 10.2 g, Rfl: 5   budesonide-formoterol (SYMBICORT) 160-4.5 MCG/ACT inhaler, Inhale 2 puffs into the lungs 2 (two) times daily., Disp: 10.2 g, Rfl: 5   cetirizine (ZYRTEC) 10 MG tablet, Take 10 mg by mouth daily., Disp: , Rfl:    Cholecalciferol (VITAMIN D) 50 MCG (2000 UT) tablet, Take 1 tablet (2,000 Units total) by mouth daily., Disp: , Rfl:    diazepam (VALIUM) 2 MG tablet, Take 1 tablet (2 mg total) by mouth every 8 (eight) hours as needed for anxiety., Disp: 30 tablet, Rfl: 0   diphenhydrAMINE (BENADRYL) 25 MG tablet, Take 50 mg by mouth every 6 (six) hours as needed (hives)., Disp: , Rfl:    fluticasone (FLONASE) 50 MCG/ACT nasal spray, Place 2 sprays into both nostrils daily., Disp: 16 g, Rfl: 0   gabapentin (NEURONTIN) 100 MG capsule, Take 1 capsule (100 mg total) by mouth 3 (three) times daily. (Patient taking differently: Take 100 mg by mouth 3 (three) times daily. prn), Disp: 270 capsule, Rfl: 0   ibuprofen (ADVIL) 200 MG tablet, Take 400 mg by mouth every 6 (six) hours as needed for moderate pain., Disp: , Rfl:    melatonin 5 MG TABS, Take 5 mg by mouth at bedtime as needed (sleep)., Disp: , Rfl:    montelukast (SINGULAIR) 10 MG  tablet, Take 1 tablet (10 mg total) by mouth every evening., Disp: 30 tablet, Rfl: 5   montelukast (SINGULAIR) 10 MG tablet, Take 1 tablet (10 mg total) by mouth every evening., Disp: 30 tablet, Rfl: 5   nystatin ointment (MYCOSTATIN), Apply 1 application externally 2 (two) times daily for 14 days, Disp: 30 g, Rfl: 2   oxyCODONE (ROXICODONE) 5 MG immediate release tablet, Take 1 tablet (5 mg total) by mouth every 8 (eight) hours as needed for up to 20 doses for severe  pain., Disp: 20 tablet, Rfl: 0   Probiotic Product ([employer] PROBIOTIC PO), Take 1 capsule by mouth daily., Disp: , Rfl:    tamoxifen (NOLVADEX) 10 MG tablet, Take 1 tablet (10 mg total) by mouth daily., Disp: 90 tablet, Rfl: 3   tirzepatide (ZEPBOUND) 5 MG/0.5ML Pen, Inject 5 mg into the skin once a week., Disp: 6 mL, Rfl: 0   EPINEPHrine (EPIPEN 2-PAK) 0.3 mg/0.3 mL IJ SOAJ injection, Inject 0.3 mLs (0.3 mg total) into the muscle once as needed for up to 1 dose (for severe allergic reaction). CAll 911 immediately if you have to use this medicine (Patient not taking: Reported on 11/06/2022), Disp: 1 each, Rfl: 1   EPINEPHrine 0.3 mg/0.3 mL IJ SOAJ injection, Inject 0.3 mg into the muscle as needed for anaphylaxis. (Patient not taking: Reported on 11/06/2022), Disp: 1 each, Rfl: 0   EPINEPHrine 0.3 mg/0.3 mL IJ SOAJ injection, Use as directed for anaphylaxis (Patient not taking: Reported on 11/06/2022), Disp: 2 each, Rfl: 1   escitalopram (LEXAPRO) 20 MG tablet, Take 1 tablet (20 mg total) by mouth daily., Disp: 30 tablet, Rfl: 0   famotidine (PEPCID) 20 MG tablet, Take 1 tablet (20 mg total) by mouth daily. (Patient not taking: Reported on 11/06/2022), Disp: 7 tablet, Rfl: 0   fluconazole (DIFLUCAN) 150 MG tablet, Take 1 tablet (150 mg total) by mouth now and repeat in 3 days (Patient not taking: Reported on 11/06/2022), Disp: 2 tablet, Rfl: 0   Olopatadine HCl (PATADAY OP), Place 1 drop into both eyes daily as needed (allergies). (Patient not taking: Reported on 11/06/2022), Disp: , Rfl:    Olopatadine HCl 0.6 % SOLN, Place 2 sprays into both nostrils 2 (two) times daily. (Patient not taking: Reported on 11/06/2022), Disp: 30.5 g, Rfl: 5   ondansetron (ZOFRAN) 4 MG tablet, Take 1 tablet (4 mg total) by mouth every 8 (eight) hours as needed for up to 20 doses for nausea or vomiting. (Patient not taking: Reported on 11/06/2022), Disp: 20 tablet, Rfl: 0   promethazine-dextromethorphan (PROMETHAZINE-DM)  6.25-15 MG/5ML syrup, Take 5 mLs by mouth 4 (four) times daily as needed for cough. (Patient not taking: Reported on 11/06/2022), Disp: 118 mL, Rfl: 0   Semaglutide, 1 MG/DOSE, (OZEMPIC, 1 MG/DOSE,) 4 MG/3ML SOPN, Inject 1 mg into the skin once a week. (Patient not taking: Reported on 11/06/2022), Disp: 3 mL, Rfl: 0   Semaglutide-Weight Management (WEGOVY) 1 MG/0.5ML SOAJ, Inject 1 mg into the skin once a week. (Patient not taking: Reported on 11/06/2022), Disp: 2 mL, Rfl: 2   Semaglutide-Weight Management (WEGOVY) 1 MG/0.5ML SOAJ, Inject 1 mg into the skin once a week. (Patient not taking: Reported on 11/06/2022), Disp: 2 mL, Rfl: 2   triamcinolone lotion (KENALOG) 0.1 %, Apply 1 Application topically 2 (two) times daily for 10 days. (Patient not taking: Reported on 11/06/2022), Disp: 60 mL, Rfl: 0 Medication Side Effects: none  Family Medical/ Social History: Changes?  Mom and sister  moved here, Mom has stage IV colon cancer  MENTAL HEALTH EXAM:  Last menstrual period 12/10/2020.There is no height or weight on file to calculate BMI.  General Appearance: Casual, Well Groomed, and Obese  Eye Contact:  Good  Speech:  Clear and Coherent and Normal Rate  Volume:  Normal  Mood:   sad  Affect:  Congruent  Thought Process:  Goal Directed and Descriptions of Associations: Circumstantial  Orientation:  Full (Time, Place, and Person)  Thought Content: Logical   Suicidal Thoughts:  No  Homicidal Thoughts:  No  Memory:  WNL  Judgement:  Good  Insight:  Good  Psychomotor Activity:  Normal  Concentration:  Concentration: Good and Attention Span: Good  Recall:  Good  Fund of Knowledge: Good  Language: Good  Assets:  Desire for Improvement Financial Resources/Insurance Housing Transportation Vocational/Educational  ADL's:  Intact  Cognition: WNL  Prognosis:  Good   Labs 10/09/2022 CBC w/diff nl except plt 432 CMP Ca 8.2, Alb 2.9, AST 63, ALT 104, Alk phos 137 all others  nl  09/22/2022 Hgb A1C 5.4 See CBC and BMP on chart  DIAGNOSES:    ICD-10-CM   1. Situational mixed anxiety and depressive disorder  F43.23     2. Generalized anxiety disorder  F41.1     3. Anhedonia  R45.84     4. Marital stress  Z63.0      Receiving Psychotherapy: Yes  [facility] at [facility]  RECOMMENDATIONS:  PDMP reviewed.  Valium filled Valium filled 09/28/2022.  Hydrocodone given 09/28/2022. I provided 30 minutes of face to face time during this encounter, including time spent before and after the visit in records review, medical decision making, counseling pertinent to today's visit, and charting.   We discussed her symptoms of lack of emotion on the Lexapro.  She also has situational depression and understands that no matter what drugs she is on she would likely still have that until the situation resolves.  Encouraged her to continue to [facility] and discuss the situation with them.    She did respond better overall to the Cymbalta in the past, had improvement with neuropathy as well as the depression.  We had to switch it due to being on tamoxifen.  I found a paper noted in the office visit on 02/04/2022 stating that Cymbalta and tamoxifen could be given together.  I will refresh my memory on that article and if indeed it shows no interaction between Cymbalta and tamoxifen, I will get in touch with her pharmacist and oncologist, Dr. Nicholas Lose, and discuss with them.  My hope is that we can get her back on the Cymbalta.  In the meantime we will continue Lexapro though.  (The article is from sciencedirect.com, volume 22, issue 3, April 2022 titled Time to update evidence-based guideline recommendations about concurrent tamoxifen and antidepressant use?  A systematic review.   Conclusion states that this latest data would suggest that giving concurrent antidepressants and tamoxifen treatment is likely safe.  Continue Valium 2 mg, 1 p.o. every 8 hours as  needed anxiety or muscle spasms.  She has plenty, given by another provider. Continue Lexapro 20 mg, 1 p.o. daily. Continue gabapentin 100 mg, 1 p.o. 3 times daily as needed. Continue therapy. Return in 3 months.  If I am able to switch to the Cymbalta I will ask her to come in sooner though.    Donnal Moat, PA-C

## 2022-11-10 ENCOUNTER — Ambulatory Visit (INDEPENDENT_AMBULATORY_CARE_PROVIDER_SITE_OTHER): Payer: No Typology Code available for payment source | Admitting: Surgical

## 2022-11-10 ENCOUNTER — Encounter: Payer: Self-pay | Admitting: Surgical

## 2022-11-10 VITALS — BP 129/81 | HR 85

## 2022-11-10 DIAGNOSIS — Z170 Estrogen receptor positive status [ER+]: Secondary | ICD-10-CM

## 2022-11-10 DIAGNOSIS — C50412 Malignant neoplasm of upper-outer quadrant of left female breast: Secondary | ICD-10-CM

## 2022-11-10 DIAGNOSIS — Z9013 Acquired absence of bilateral breasts and nipples: Secondary | ICD-10-CM

## 2022-11-10 DIAGNOSIS — F4312 Post-traumatic stress disorder, chronic: Secondary | ICD-10-CM | POA: Diagnosis not present

## 2022-11-10 NOTE — Progress Notes (Signed)
47 year old female here for follow-up on her bilateral breast reconstruction.  She most recently underwent exchange of the bilateral breast tissue expanders due to a leak on the left side.  There was an attempt to place an implant on the left side, however it did not give her adequate volume and an expander was placed.  We have been expanding the expanders for improved shape and symmetry.  Jennifer Davis reports she is doing well, reports she has had some tenderness along the inframammary fold on the right side, a burning sensation as well.  Feels similar to when she had a rash in that area.  She has not noticed any skin changes though.  Chaperone present on exam On exam bilateral breast incisions are intact and healing well.  There is no erythema or cellulitic changes noted.  There is no subcutaneous fluid collection noted of either breast.  I do not appreciate any rashes or overlying skin changes in the area of tenderness.  A/P:  Continue with compressive garments, which needs activities.  We discussed the tenderness/burning sensation on the right side may be related to the muscle expansion or the expander being sutured in place in that area.  Recommend using Valium to help with muscle relaxation to improve symptoms.  I do not see any signs of infection or concern on exam.  We placed injectable saline in the Expander using a sterile technique: Right: 75 cc for a total of 375 / 535 cc Left: 0 cc for a total of 450 / 535 cc  Recommend following up in 2 weeks for additional fill.

## 2022-11-16 ENCOUNTER — Other Ambulatory Visit (HOSPITAL_COMMUNITY): Payer: Self-pay

## 2022-11-17 ENCOUNTER — Other Ambulatory Visit (HOSPITAL_COMMUNITY): Payer: Self-pay

## 2022-11-17 ENCOUNTER — Ambulatory Visit (INDEPENDENT_AMBULATORY_CARE_PROVIDER_SITE_OTHER): Payer: Self-pay | Admitting: Plastic Surgery

## 2022-11-17 ENCOUNTER — Encounter: Payer: Self-pay | Admitting: Plastic Surgery

## 2022-11-17 VITALS — BP 125/80 | HR 83

## 2022-11-17 DIAGNOSIS — Z01419 Encounter for gynecological examination (general) (routine) without abnormal findings: Secondary | ICD-10-CM | POA: Diagnosis not present

## 2022-11-17 DIAGNOSIS — N898 Other specified noninflammatory disorders of vagina: Secondary | ICD-10-CM | POA: Diagnosis not present

## 2022-11-17 DIAGNOSIS — F411 Generalized anxiety disorder: Secondary | ICD-10-CM | POA: Diagnosis not present

## 2022-11-17 DIAGNOSIS — E1169 Type 2 diabetes mellitus with other specified complication: Secondary | ICD-10-CM | POA: Diagnosis not present

## 2022-11-17 DIAGNOSIS — Z853 Personal history of malignant neoplasm of breast: Secondary | ICD-10-CM | POA: Diagnosis not present

## 2022-11-17 DIAGNOSIS — Z719 Counseling, unspecified: Secondary | ICD-10-CM

## 2022-11-17 DIAGNOSIS — Z9010 Acquired absence of unspecified breast and nipple: Secondary | ICD-10-CM | POA: Diagnosis not present

## 2022-11-17 DIAGNOSIS — F4312 Post-traumatic stress disorder, chronic: Secondary | ICD-10-CM | POA: Diagnosis not present

## 2022-11-17 DIAGNOSIS — J45909 Unspecified asthma, uncomplicated: Secondary | ICD-10-CM | POA: Diagnosis not present

## 2022-11-17 MED ORDER — FLUCONAZOLE 150 MG PO TABS
150.0000 mg | ORAL_TABLET | ORAL | 0 refills | Status: DC
  Filled 2022-11-17: qty 2, 3d supply, fill #0

## 2022-11-17 NOTE — Progress Notes (Signed)
   Subjective:    Patient ID: Jennifer Davis, female    DOB: [birth year], 47 y.o.   MRN: [id number]  The patient is a 47 year old female here for evaluation of her face.  She is interested in laser.  She has hyperpigmentation and some scarring from acne.  Overall she is healthy and has recovered from cancer.  She has a laser set up for Friday.      Review of Systems  Constitutional: Negative.   Eyes: Negative.   Respiratory: Negative.    Cardiovascular: Negative.   Gastrointestinal: Negative.   Endocrine: Negative.   Genitourinary: Negative.   Musculoskeletal: Negative.        Objective:   Physical Exam Constitutional:      Appearance: Normal appearance.  Cardiovascular:     Rate and Rhythm: Normal rate.  Skin:    Capillary Refill: Capillary refill takes less than 2 seconds.  Neurological:     Mental Status: She is alert and oriented to person, place, and time.  Psychiatric:        Mood and Affect: Mood normal.        Behavior: Behavior normal.        Thought Content: Thought content normal.        Judgment: Judgment normal.           Assessment & Plan:     ICD-10-CM   1. Encounter for counseling  Z71.9       Good candidate for laser.  She is likely a Fitzpatrick 3 and will do well with 3 treatments and then we can move to the halo.  Will also get her set up for a free consult with [employer].

## 2022-11-20 ENCOUNTER — Other Ambulatory Visit (HOSPITAL_COMMUNITY): Payer: Self-pay

## 2022-11-20 ENCOUNTER — Encounter: Payer: Self-pay | Admitting: Student

## 2022-11-20 ENCOUNTER — Ambulatory Visit (INDEPENDENT_AMBULATORY_CARE_PROVIDER_SITE_OTHER): Payer: Self-pay | Admitting: Student

## 2022-11-20 VITALS — BP 127/81 | HR 101

## 2022-11-20 DIAGNOSIS — Z719 Counseling, unspecified: Secondary | ICD-10-CM

## 2022-11-20 MED ORDER — PEG 3350-KCL-NA BICARB-NACL 420 G PO SOLR
ORAL | 0 refills | Status: DC
  Filled 2022-11-20: qty 4000, 1d supply, fill #0

## 2022-11-20 NOTE — Progress Notes (Addendum)
Patient applied 4% numbing cream on twice today before her visit today.  I discussed with the patient that given that this is her first laser appointment, it would be more beneficial to her to not have numbing cream on so that she can better give me feedback during the laser treatment.  I recommended that the patient move her appointment to a different day on the day that she does not have numbing cream on.  Patient expressed understanding and was in agreement with this plan.    We will plan to do her treatment next week and not have her apply a numbing cream when she comes in.  Pictures were obtained of the patient and placed in the chart with the patient's or guardian's permission.

## 2022-11-23 ENCOUNTER — Other Ambulatory Visit (HOSPITAL_COMMUNITY): Payer: Self-pay

## 2022-11-24 ENCOUNTER — Encounter: Payer: Commercial Managed Care - PPO | Admitting: Surgical

## 2022-11-24 NOTE — Progress Notes (Signed)
Patient is a 47 year old female with PMH of left-sided breast cancer s/p bilateral mastectomy with implant-based reconstruction performed 12/23/863 complicated by right-sided expander removal 03/19/2021.  After continued left-sided expander fills she eventually developed left-sided expander leak.  She has since gone to the OR 09/28/2022 for right-sided tissue expander placement and left-sided expander replacement.  She was last evaluated here in clinic for her breast reconstruction on 11/10/2022.  At that time, she endorsed some tenderness along inframammary fold on the right side.  75 cc was placed into the right expander for a total of 375/535 cc.  0 cc placed on the left, current volume 450/535 cc.  Physical exam was entirely reassuring.  Plan was for follow-up in 2 weeks for additional fill.    Today, patient states that she is doing fine from a postoperative reconstruction standpoint.  Her only complaint is that she has a single stitch protruding from the right side expander placement site that has been bothersome.  Otherwise, doing well.  When asked about the size of her left expander, she is satisfied with current volume.    On physical exam, expanders are appropriately placed.  Left side does appear to have a bit more superior pole fullness compared to the right.  Skin is not particularly taut on either side, left side is slightly tighter than the right.  No areas of erythema.  Single suture from middle of right expander placement site is removed without complication or difficulty.  We placed injectable saline in the Expander using a sterile technique: Right: 75 cc for a total of 450 / 535 cc Left: 0 cc for a total of 450 / 535 cc  Her goals today were to make the right side expander symmetric in volume compared to the left.  This was achieved with the expander injection.  There is relatively good symmetry with her expanders and she is satisfied with the current volume.  Will have her return to  clinic in 10 to 14 days to follow-up with Dr. Marla Roe or one of the PAs who is in clinic with her that day so that she can evaluate patient for possible final expander fill versus proceed to next phase of breast reconstruction with implant exchange.  Picture(s) obtained of the patient and placed in the chart were with the patient's or guardian's permission.

## 2022-11-25 DIAGNOSIS — F4312 Post-traumatic stress disorder, chronic: Secondary | ICD-10-CM | POA: Diagnosis not present

## 2022-11-26 ENCOUNTER — Ambulatory Visit (INDEPENDENT_AMBULATORY_CARE_PROVIDER_SITE_OTHER): Payer: Self-pay | Admitting: Student

## 2022-11-26 ENCOUNTER — Encounter: Payer: Commercial Managed Care - PPO | Admitting: Surgical

## 2022-11-26 ENCOUNTER — Ambulatory Visit (INDEPENDENT_AMBULATORY_CARE_PROVIDER_SITE_OTHER): Payer: Commercial Managed Care - PPO | Admitting: Physician Assistant

## 2022-11-26 ENCOUNTER — Encounter: Payer: Self-pay | Admitting: Student

## 2022-11-26 VITALS — BP 118/79 | HR 78

## 2022-11-26 DIAGNOSIS — Z719 Counseling, unspecified: Secondary | ICD-10-CM

## 2022-11-26 DIAGNOSIS — Z9889 Other specified postprocedural states: Secondary | ICD-10-CM

## 2022-11-26 NOTE — Progress Notes (Signed)
Preoperative Dx: hyperpigmentation and cherry angiomas  Postoperative Dx:  same  Procedure: laser to face   Anesthesia: none  Description of Procedure:   Risks and complications were explained to the patient. Consent was confirmed. Time out was called and all information was confirmed to be correct. The area  area was prepped with alcohol and wiped dry. Eye protection was applied over patient's eyes.   Primer Pass: BBL Forever Plus laser was set at 4 J/cm2 using 590 nm filter with 3 pulse width. The face was lasered.   Corrective: The BBL laser was then set to 20 J/cm2 using the 560 nm filter with 20 pulse width. Using the 65m adapter, the cherry angioma to the patient's forehead was lasered with one shot and the cherry angioma just right/lateral to the patient's nose was lasered with one shot. The BBL laser was then set to 8.5 J/cm2 using the 515 nm with 15 pulse width. Several dark areas of pigmentation were lasered with one shot using the 766madapter. Area of dark pigmentation near patient's left alar crease was not lasered per patient's request. The BBL laser was then set to 10 J/cm2 using the 515 nm with 15 pulse width. Several light areas of pigmentation were lasered with one shot using the 37m11mdapter.   The patient tolerated the procedure well and there were no complications. Post-procedure care was discussed with the patient. The patient is to follow up in 4 weeks.

## 2022-12-02 DIAGNOSIS — Z1211 Encounter for screening for malignant neoplasm of colon: Secondary | ICD-10-CM | POA: Diagnosis not present

## 2022-12-02 DIAGNOSIS — Z800 Family history of malignant neoplasm of digestive organs: Secondary | ICD-10-CM | POA: Diagnosis not present

## 2022-12-02 DIAGNOSIS — D123 Benign neoplasm of transverse colon: Secondary | ICD-10-CM | POA: Diagnosis not present

## 2022-12-02 DIAGNOSIS — K648 Other hemorrhoids: Secondary | ICD-10-CM | POA: Diagnosis not present

## 2022-12-03 ENCOUNTER — Other Ambulatory Visit: Payer: Self-pay | Admitting: Physician Assistant

## 2022-12-03 ENCOUNTER — Other Ambulatory Visit (HOSPITAL_COMMUNITY): Payer: Self-pay

## 2022-12-03 DIAGNOSIS — Z6832 Body mass index (BMI) 32.0-32.9, adult: Secondary | ICD-10-CM | POA: Diagnosis not present

## 2022-12-03 DIAGNOSIS — R632 Polyphagia: Secondary | ICD-10-CM | POA: Diagnosis not present

## 2022-12-03 DIAGNOSIS — E669 Obesity, unspecified: Secondary | ICD-10-CM | POA: Diagnosis not present

## 2022-12-03 MED ORDER — ESCITALOPRAM OXALATE 20 MG PO TABS
20.0000 mg | ORAL_TABLET | Freq: Every day | ORAL | 1 refills | Status: DC
  Filled 2022-12-03: qty 30, 30d supply, fill #0
  Filled 2022-12-31: qty 30, 30d supply, fill #1

## 2022-12-03 MED ORDER — ZEPBOUND 7.5 MG/0.5ML ~~LOC~~ SOAJ
SUBCUTANEOUS | 0 refills | Status: DC
  Filled 2022-12-03: qty 2, 28d supply, fill #0

## 2022-12-04 ENCOUNTER — Other Ambulatory Visit (HOSPITAL_COMMUNITY): Payer: Self-pay

## 2022-12-07 DIAGNOSIS — F4312 Post-traumatic stress disorder, chronic: Secondary | ICD-10-CM | POA: Diagnosis not present

## 2022-12-11 ENCOUNTER — Encounter: Payer: Self-pay | Admitting: Surgical

## 2022-12-11 ENCOUNTER — Ambulatory Visit (INDEPENDENT_AMBULATORY_CARE_PROVIDER_SITE_OTHER): Payer: Commercial Managed Care - PPO | Admitting: Surgical

## 2022-12-11 VITALS — BP 123/82 | HR 89

## 2022-12-11 DIAGNOSIS — Z9013 Acquired absence of bilateral breasts and nipples: Secondary | ICD-10-CM

## 2022-12-11 DIAGNOSIS — Z9889 Other specified postprocedural states: Secondary | ICD-10-CM

## 2022-12-11 NOTE — Progress Notes (Signed)
Patient is very pleasant 47 year old female here for follow-up on her bilateral breast reconstruction.  She initially underwent bilateral mastectomy for left breast cancer, complicated by right-sided expander removal on 03/19/2021.  She subsequently had an expander leak on the left side.  She most recently underwent exchange of bilateral breast tissue expanders due to a leak on the left side, she has been tolerating expansion well.  She reports today that she would like to be larger than she currently is, she would like an additional fill today if possible.  Of note she is radiated on the left side.  Chaperone present on exam On exam bilateral breast incisions are intact and healing well, some radiation skin changes noted on the left side.  The left expander is sitting slightly higher than the right, approximately 0.5 to 1 cm difference in the inframammary fold height.  There is minimal tension on the incisions prior to expansion.  There is no subcutaneous fluid collections noted, no erythema or cellulitic changes.  A/P:  47 year old female currently undergoing bilateral breast reconstruction.  We expanded bilateral expanders today due to patient wanting to be larger, she had minimal tension on the incisions and therefore we elected to do an additional expansion.  She tolerated this well.  We placed injectable saline in the Expander using a sterile technique: Right: 60 cc for a total of 510 / 535 cc Left: 60 cc for a total of 510 / 535 cc  Recommend following up in 2 weeks for reevaluation, I would like to start planning for exchange of her bilateral breast tissue expanders for bilateral breast implants likely late February or early March.  I will go ahead and route this to our surgical scheduling team today to plan for this.  She may need 1 additional fill, we will reevaluate at that time.  Pictures were obtained of the patient and placed in the chart with the patient's or guardian's  permission.

## 2022-12-21 ENCOUNTER — Ambulatory Visit (INDEPENDENT_AMBULATORY_CARE_PROVIDER_SITE_OTHER): Payer: Self-pay | Admitting: Student

## 2022-12-21 ENCOUNTER — Encounter: Payer: Self-pay | Admitting: Student

## 2022-12-21 VITALS — BP 123/78 | HR 88

## 2022-12-21 DIAGNOSIS — Z719 Counseling, unspecified: Secondary | ICD-10-CM

## 2022-12-21 NOTE — Progress Notes (Signed)
Patient presents today for her second BBL treatment.  She states that she has been doing well.  She states that she tolerated the first treatment very well.  She denied any issues or concerns after the first treatment.  She states that she feels since her first treatment, the hyperpigmentation to the superior aspect of her cheeks has lightened up a little bit.  Patient states that she has not applied any numbing cream today.   Preoperative Dx: Hyperpigmentation and cherry angiomas  Postoperative Dx:  same  Procedure: laser to face   Anesthesia: none  Description of Procedure:  Risks and complications were explained to the patient. Consent was confirmed. Time out was called and all information was confirmed to be correct. The area  area was prepped with alcohol and wiped dry.  Eye protection was applied over the patient's eyes.  Primer Pass: BBL Forever Plus laser was set at 5 J/cm2 using 590 nm filter with 3 pulse width.  The face was lasered.   Corrective: The BBL laser was then set to 20 J/cm using the 560 nm filter with 20 pulse width.  Using the 7 mm adapter, the cherry angioma to the patient's forehead was lasered, the cherry angioma just right/lateral to the patient's nose was lasered, and a cherry angioma to the right lateral cheek was lasered.  The BBL laser was then set to 11 J/cm using the 515 nm filter with 15 pulse width.  Several light areas of pigmentation were lasered with the 7 mm adapter.  The BBL laser was then sent to 9.5 J/cm using the 515 nm filter with 15 pulse width.  Several areas of dark pigmentation were lasered using the 7 mm doctor.  Area of dark pigmentation near patient's left alar crease was not lasered per patient's request.  The patient tolerated the procedure well and there were no complications.  Postprocedure care was discussed with the patient.  The patient is to follow up in 4 weeks.

## 2022-12-22 ENCOUNTER — Other Ambulatory Visit (HOSPITAL_COMMUNITY): Payer: Self-pay

## 2022-12-22 MED ORDER — ZEPBOUND 7.5 MG/0.5ML ~~LOC~~ SOAJ
7.5000 mg | SUBCUTANEOUS | 0 refills | Status: DC
  Filled 2022-12-22: qty 2, 28d supply, fill #0

## 2022-12-23 DIAGNOSIS — F4312 Post-traumatic stress disorder, chronic: Secondary | ICD-10-CM | POA: Diagnosis not present

## 2022-12-25 ENCOUNTER — Other Ambulatory Visit (HOSPITAL_COMMUNITY): Payer: Self-pay

## 2022-12-25 ENCOUNTER — Encounter: Payer: Self-pay | Admitting: Surgical

## 2022-12-25 ENCOUNTER — Ambulatory Visit (INDEPENDENT_AMBULATORY_CARE_PROVIDER_SITE_OTHER): Payer: Commercial Managed Care - PPO | Admitting: Surgical

## 2022-12-25 VITALS — BP 125/83 | HR 105

## 2022-12-25 DIAGNOSIS — Z9013 Acquired absence of bilateral breasts and nipples: Secondary | ICD-10-CM

## 2022-12-25 DIAGNOSIS — Z9889 Other specified postprocedural states: Secondary | ICD-10-CM

## 2022-12-25 NOTE — Progress Notes (Signed)
Patient is a very pleasant 47 year old female here for follow-up after bilateral breast reconstruction.  She initially underwent bilateral mastectomy for left breast cancer, complicated by right-sided expander removal on 03/19/2021.  Subsequently had an expander leak on the left side, most recently underwent exchange of bilateral breast tissue expanders due to the left-sided leak.  She has been tolerating expansion well, at her last appointment we expanded for an additional 60 cc.  Of note she has been radiated on the left side.  Patient reports she is doing well after her last expansion, reports initially it was tight but it has improved.  She does report that her goal size is about a C cup and she feels as if she would like to have an additional fill.  She previously had an area of suture protruding through the right mastectomy incision which was clipped, however reports that it is beginning to slowly protrude again.  Chaperone present on exam On exam bilateral breast incisions are intact and healing well, no erythema or cellulitic changes noted.  There is no subcutaneous fluid collection noted.  The inframammary fold on the right breast is slightly lower by about 1 to 1 and half centimeter.  There is a small scab of the central right breast incision where a suture is starting to protrude through the skin.  A/P:  Discussed with patient I do not think we should do an additional fill today, specifically due to the left breast being radiated and the tightness of the left breast at this time.  I would recommend allowing 2 weeks for the skin to continue to slowly expand.  I also discussed patient's case with Dr. Marla Roe who was also able to evaluate patient today and agrees with plan to wait additional 2 weeks and do 1 more expansion at that time.  Patient was agreeable to this.  We will plan to see her back in 2 weeks for reevaluation and final expansion.  We did not place injectable saline in the  Expander using a sterile technique: Right: 0 cc for a total of 510 / 535 cc Left:  0 cc for a total of 510 / 535 cc

## 2022-12-28 ENCOUNTER — Other Ambulatory Visit (HOSPITAL_COMMUNITY): Payer: Self-pay

## 2022-12-28 DIAGNOSIS — F4312 Post-traumatic stress disorder, chronic: Secondary | ICD-10-CM | POA: Diagnosis not present

## 2022-12-29 ENCOUNTER — Other Ambulatory Visit (HOSPITAL_COMMUNITY): Payer: Self-pay

## 2022-12-29 MED ORDER — WEGOVY 1 MG/0.5ML ~~LOC~~ SOAJ
1.0000 mg | SUBCUTANEOUS | 1 refills | Status: DC
  Filled 2022-12-29: qty 2, 28d supply, fill #0

## 2022-12-31 ENCOUNTER — Other Ambulatory Visit (HOSPITAL_COMMUNITY): Payer: Self-pay

## 2023-01-01 ENCOUNTER — Other Ambulatory Visit (HOSPITAL_COMMUNITY): Payer: Self-pay

## 2023-01-01 ENCOUNTER — Other Ambulatory Visit: Payer: Self-pay

## 2023-01-01 MED ORDER — ALBUTEROL SULFATE HFA 108 (90 BASE) MCG/ACT IN AERS
2.0000 | INHALATION_SPRAY | RESPIRATORY_TRACT | 0 refills | Status: DC | PRN
  Filled 2023-01-01: qty 6.7, 25d supply, fill #0

## 2023-01-06 DIAGNOSIS — F4312 Post-traumatic stress disorder, chronic: Secondary | ICD-10-CM | POA: Diagnosis not present

## 2023-01-07 DIAGNOSIS — E669 Obesity, unspecified: Secondary | ICD-10-CM | POA: Diagnosis not present

## 2023-01-07 DIAGNOSIS — R632 Polyphagia: Secondary | ICD-10-CM | POA: Diagnosis not present

## 2023-01-07 DIAGNOSIS — Z6832 Body mass index (BMI) 32.0-32.9, adult: Secondary | ICD-10-CM | POA: Diagnosis not present

## 2023-01-07 DIAGNOSIS — F4329 Adjustment disorder with other symptoms: Secondary | ICD-10-CM | POA: Diagnosis not present

## 2023-01-07 DIAGNOSIS — Z9010 Acquired absence of unspecified breast and nipple: Secondary | ICD-10-CM | POA: Diagnosis not present

## 2023-01-08 ENCOUNTER — Encounter: Payer: Self-pay | Admitting: Surgical

## 2023-01-08 ENCOUNTER — Ambulatory Visit: Payer: Commercial Managed Care - PPO | Admitting: Surgical

## 2023-01-08 VITALS — BP 125/81 | HR 88

## 2023-01-08 DIAGNOSIS — Z853 Personal history of malignant neoplasm of breast: Secondary | ICD-10-CM | POA: Diagnosis not present

## 2023-01-08 DIAGNOSIS — Z9013 Acquired absence of bilateral breasts and nipples: Secondary | ICD-10-CM

## 2023-01-08 DIAGNOSIS — C50919 Malignant neoplasm of unspecified site of unspecified female breast: Secondary | ICD-10-CM

## 2023-01-08 DIAGNOSIS — Z923 Personal history of irradiation: Secondary | ICD-10-CM | POA: Diagnosis not present

## 2023-01-08 DIAGNOSIS — Z9889 Other specified postprocedural states: Secondary | ICD-10-CM

## 2023-01-08 NOTE — Progress Notes (Signed)
Patient is a very pleasant 47 year old female here for follow-up on her bilateral breast reconstruction.  She has bilateral tissue expanders in place.  At her last appointment we discussed that the bilateral mastectomy flaps were quite tight and recommend allowing additional time for the skin to stretch prior to additional expansion.  Since then she feels as if the skin has softened and does not feel as tight, she would like to have an additional fill if possible.  She is overall happy with the size, but understands that the implants will look smaller and would like to expand for this reason.  Chaperone present on exam On exam bilateral breast incisions are intact and healing well, no erythema or cellulitic changes noted.  No subcutaneous fluid collection noted.  She does have some asymmetry of the expander placement with the right breast inframammary fold being slightly lower causing some superior pole fullness asymmetry, the left side more full in the superior pole.  She continues to have a small scab of the central right breast incision where sutures protruding to the skin.  No surrounding redness or signs of infection.  A/P:  We discussed that only a small amount of fluid can be placed in the expanders today due to the skin being tight and her having an increased risk of complications related to the radiation.  We elected to fill a small amount in each breast with 40 cc.  Patient tolerated this well. We placed injectable saline in the Expander using a sterile technique: Right: 40 cc for a total of 550 / 535 cc Left: 40 cc for a total of 550 / 535 cc  We discussed I do not feel as if additional fluid would be able to be placed in the expanders due to the skin being tight, increased filling would increase her risk of complications, particularly on the left side which has been radiated.  Patient is comfortable moving forward with expander to implant exchange, I discussed allowing approximately 4  weeks to allow the skin to continue to stretch, recommend using Valium once daily in the evenings over the next week to allow optimal pectoralis relaxation after this final expansion.  Pictures were obtained of the patient and placed in the chart with the patient's or guardian's permission.  Will send route to surgical scheduling staff for planning for exchange of bilateral breast tissue expanders for silicone breast implants.  She was provided with the [facility] implant checklist today.

## 2023-01-13 ENCOUNTER — Other Ambulatory Visit (HOSPITAL_COMMUNITY): Payer: Self-pay

## 2023-01-13 DIAGNOSIS — H5213 Myopia, bilateral: Secondary | ICD-10-CM | POA: Diagnosis not present

## 2023-01-13 DIAGNOSIS — F4312 Post-traumatic stress disorder, chronic: Secondary | ICD-10-CM | POA: Diagnosis not present

## 2023-01-13 MED ORDER — WEGOVY 2.4 MG/0.75ML ~~LOC~~ SOAJ
2.4000 mg | SUBCUTANEOUS | 2 refills | Status: DC
  Filled 2023-01-13 – 2023-01-20 (×2): qty 3, 28d supply, fill #0

## 2023-01-18 ENCOUNTER — Other Ambulatory Visit: Payer: Commercial Managed Care - PPO | Admitting: Student

## 2023-01-20 ENCOUNTER — Other Ambulatory Visit (HOSPITAL_COMMUNITY): Payer: Self-pay

## 2023-01-20 DIAGNOSIS — F4312 Post-traumatic stress disorder, chronic: Secondary | ICD-10-CM | POA: Diagnosis not present

## 2023-01-20 MED ORDER — WEGOVY 1.7 MG/0.75ML ~~LOC~~ SOAJ
1.7000 mg | SUBCUTANEOUS | 0 refills | Status: DC
  Filled 2023-01-20 – 2023-02-02 (×2): qty 3, 28d supply, fill #0

## 2023-01-21 ENCOUNTER — Other Ambulatory Visit (HOSPITAL_COMMUNITY): Payer: Self-pay

## 2023-01-26 DIAGNOSIS — F4312 Post-traumatic stress disorder, chronic: Secondary | ICD-10-CM | POA: Diagnosis not present

## 2023-01-27 ENCOUNTER — Encounter: Payer: Self-pay | Admitting: Student

## 2023-01-27 ENCOUNTER — Telehealth: Payer: Self-pay | Admitting: Plastic Surgery

## 2023-01-27 ENCOUNTER — Other Ambulatory Visit (HOSPITAL_COMMUNITY): Payer: Self-pay

## 2023-01-27 ENCOUNTER — Ambulatory Visit (INDEPENDENT_AMBULATORY_CARE_PROVIDER_SITE_OTHER): Payer: Self-pay | Admitting: Student

## 2023-01-27 VITALS — BP 122/82 | HR 90

## 2023-01-27 DIAGNOSIS — Z719 Counseling, unspecified: Secondary | ICD-10-CM

## 2023-01-27 NOTE — Progress Notes (Signed)
Patient reports she is doing well.  She states that she feels a lot of her lighter pigmentation has faded and several retearing angiomas are gone.  She does state that some of the darker spots have not faded as much.  She denies any other issues.   Preoperative Dx: Hyperpigmentation, Cherry Angiomas   Postoperative Dx:  same  Procedure: laser to face   Anesthesia: none  Description of Procedure:   Risks and complications were explained to the patient. Consent was confirmed. Time out was called and all information was confirmed to be correct. The area  area was prepped with alcohol and wiped dry. Eye protection was applied to patient's eyes.   Primer Pass: BBL Forever Plus laser was set at 6 J/cm2 using 590 nm filter with 3 pulse width.  The face was lasered.    Corrective: The BBL laser was then set to 22 J/cm using the 560 nm filter with 19 pulse width.  Using the 7 mm adapter, the cherry angioma to the patient's right lateral cheek was lasered.  The BBL laser was then set to 12 J/cm using the 515 nm filter with 15 pulse width.  Several light areas of pigmentation were lasered with the 7 mm adapter.  The BBL laser was then sent to 11 J/cm using the 515 nm filter with 15 pulse width.  Several areas of dark pigmentation were lasered using the 7 mm adapter.  Area of dark pigmentation near patient's left alar crease was not lasered per patient's request.   The patient tolerated the procedure well and there were no complications.  Postprocedure care was discussed with the patient.  The patient is to follow up in 4 weeks.

## 2023-01-27 NOTE — Telephone Encounter (Signed)
Pt asked while she was leaving today, where are we in the scheduling of the next surgery?  Please call pt with update so she has a timeline.

## 2023-01-28 ENCOUNTER — Other Ambulatory Visit (HOSPITAL_COMMUNITY): Payer: Self-pay

## 2023-01-28 MED ORDER — BUDESONIDE-FORMOTEROL FUMARATE 160-4.5 MCG/ACT IN AERO
2.0000 | INHALATION_SPRAY | Freq: Two times a day (BID) | RESPIRATORY_TRACT | 5 refills | Status: DC
  Filled 2023-01-28: qty 10.2, 30d supply, fill #0
  Filled 2023-03-17: qty 10.2, 30d supply, fill #1
  Filled 2023-04-17 – 2023-04-21 (×2): qty 10.2, 30d supply, fill #2
  Filled 2023-07-04: qty 10.2, 30d supply, fill #3
  Filled 2023-09-28: qty 10.2, 30d supply, fill #4
  Filled 2023-12-22: qty 10.2, 30d supply, fill #5

## 2023-01-28 MED ORDER — ALBUTEROL SULFATE HFA 108 (90 BASE) MCG/ACT IN AERS
2.0000 | INHALATION_SPRAY | RESPIRATORY_TRACT | 0 refills | Status: DC
  Filled 2023-01-28: qty 6.7, 25d supply, fill #0

## 2023-01-28 NOTE — Telephone Encounter (Signed)
Called patient and advised I will start on her case today and let her know once I hear back from the insurance company.

## 2023-01-29 ENCOUNTER — Other Ambulatory Visit: Payer: Self-pay

## 2023-01-29 ENCOUNTER — Telehealth: Payer: Self-pay

## 2023-01-29 DIAGNOSIS — F4312 Post-traumatic stress disorder, chronic: Secondary | ICD-10-CM | POA: Diagnosis not present

## 2023-01-29 NOTE — Telephone Encounter (Signed)
No PA needed for CPT 11970. Gave Heather case to scheduled. Advised patient, she will be calling soon to schedule surgery.

## 2023-02-02 ENCOUNTER — Telehealth: Payer: Self-pay | Admitting: Physician Assistant

## 2023-02-02 ENCOUNTER — Encounter: Payer: Self-pay | Admitting: Hematology and Oncology

## 2023-02-02 ENCOUNTER — Ambulatory Visit (INDEPENDENT_AMBULATORY_CARE_PROVIDER_SITE_OTHER): Payer: Commercial Managed Care - PPO | Admitting: Physician Assistant

## 2023-02-02 ENCOUNTER — Encounter: Payer: Self-pay | Admitting: Physician Assistant

## 2023-02-02 ENCOUNTER — Other Ambulatory Visit (HOSPITAL_COMMUNITY): Payer: Self-pay

## 2023-02-02 DIAGNOSIS — R632 Polyphagia: Secondary | ICD-10-CM | POA: Diagnosis not present

## 2023-02-02 DIAGNOSIS — F4323 Adjustment disorder with mixed anxiety and depressed mood: Secondary | ICD-10-CM | POA: Diagnosis not present

## 2023-02-02 DIAGNOSIS — Z630 Problems in relationship with spouse or partner: Secondary | ICD-10-CM | POA: Diagnosis not present

## 2023-02-02 DIAGNOSIS — Z6831 Body mass index (BMI) 31.0-31.9, adult: Secondary | ICD-10-CM | POA: Diagnosis not present

## 2023-02-02 DIAGNOSIS — E669 Obesity, unspecified: Secondary | ICD-10-CM | POA: Diagnosis not present

## 2023-02-02 DIAGNOSIS — F418 Other specified anxiety disorders: Secondary | ICD-10-CM | POA: Diagnosis not present

## 2023-02-02 MED ORDER — ESCITALOPRAM OXALATE 20 MG PO TABS
30.0000 mg | ORAL_TABLET | Freq: Every day | ORAL | 1 refills | Status: DC
  Filled 2023-02-02: qty 45, 30d supply, fill #0

## 2023-02-02 NOTE — Progress Notes (Signed)
[facility] Med Check  Patient ID: Jennifer Davis,  MRN: [id number]  PCP: Wenda Low, MD  Date of Evaluation: 02/02/2023 Time spent:20 minutes   Chief Complaint:  Chief Complaint   Depression; Follow-up; Anxiety    HISTORY/CURRENT STATUS: HPI For routine med check.   Took a week off work a few weeks ago b/c of stress in marriage, was crying easily, has been very emotional, not wanting to do the things but she has not missed any more work.  Energy and motivation are fair to good.  Appetite is normal and weight is stable although she is taking Wegovy which is helping decrease her appetite.  Personal hygiene is normal.  No suicidal or homicidal thoughts.  We had talked about changing back to Cymbalta, even though she is on tamoxifen.  She forgot about it and did not discuss it with her oncologist.  At this point she feels that the Lexapro is effective but not quite enough, especially with everything that has been going on.  Patient denies increased energy with decreased need for sleep, increased talkativeness, racing thoughts, impulsivity or risky behaviors, increased spending, increased libido, grandiosity, increased irritability or anger, paranoia, or hallucinations.  Denies dizziness, syncope, seizures, numbness, tingling, tremor, tics, unsteady gait, slurred speech, confusion.  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  Denies muscle stiffness or dystonia.  Individual Medical History/ Review of Systems: Changes? :Yes  see HPI  Past medications for mental health diagnoses include: Lexapro, Prozac wasn't effective, Xanax, Buspar worked but d/c when didn't need it.  Allergies: Bee venom, Dog epithelium (canis lupus familiaris), Dust mite extract, Grass pollen([id number] vern), Other, and Pollen extract  Current Medications:  Current Outpatient Medications:    albuterol (VENTOLIN HFA) 108 (90 Base) MCG/ACT inhaler, Inhale 2 puffs  into the lungs every 4-6 hours as needed for cough/wheeze, Disp: 6.7 g, Rfl: 0   budesonide-formoterol (SYMBICORT) 160-4.5 MCG/ACT inhaler, Inhale 2 puffs into the lungs 2 (two) times daily., Disp: 10.2 g, Rfl: 5   cetirizine (ZYRTEC) 10 MG tablet, Take 10 mg by mouth daily., Disp: , Rfl:    Cholecalciferol (VITAMIN D) 50 MCG (2000 UT) tablet, Take 1 tablet (2,000 Units total) by mouth daily., Disp: , Rfl:    diazepam (VALIUM) 2 MG tablet, Take 1 tablet (2 mg total) by mouth every 8 (eight) hours as needed for anxiety., Disp: 30 tablet, Rfl: 0   diphenhydrAMINE (BENADRYL) 25 MG tablet, Take 50 mg by mouth every 6 (six) hours as needed (hives)., Disp: , Rfl:    fluticasone (FLONASE) 50 MCG/ACT nasal spray, Place 2 sprays into both nostrils daily., Disp: 16 g, Rfl: 0   gabapentin (NEURONTIN) 100 MG capsule, Take 1 capsule (100 mg total) by mouth 3 (three) times daily. (Patient taking differently: Take 100 mg by mouth 3 (three) times daily. prn), Disp: 270 capsule, Rfl: 0   ibuprofen (ADVIL) 200 MG tablet, Take 400 mg by mouth every 6 (six) hours as needed for moderate pain., Disp: , Rfl:    melatonin 5 MG TABS, Take 5 mg by mouth at bedtime as needed (sleep)., Disp: , Rfl:    montelukast (SINGULAIR) 10 MG tablet, Take 1 tablet (10 mg total) by mouth every evening., Disp: 30 tablet, Rfl: 5   Multiple Vitamin (MULTI VITAMIN) TABS, 1 tablet Orally Once a day, Disp: , Rfl:    ondansetron (ZOFRAN) 4 MG tablet, Take 1 tablet (4 mg total) by mouth every 8 (eight) hours  as needed for up to 20 doses for nausea or vomiting., Disp: 20 tablet, Rfl: 0   oxyCODONE (ROXICODONE) 5 MG immediate release tablet, Take 1 tablet (5 mg total) by mouth every 8 (eight) hours as needed for up to 20 doses for severe pain., Disp: 20 tablet, Rfl: 0   Probiotic Product ([employer] PROBIOTIC PO), Take 1 capsule by mouth daily., Disp: , Rfl:    Semaglutide-Weight Management (WEGOVY) 1 MG/0.5ML SOAJ, Inject 1 mg into the skin once a week.,  Disp: 2 mL, Rfl: 1   tamoxifen (NOLVADEX) 10 MG tablet, Take 1 tablet (10 mg total) by mouth daily., Disp: 90 tablet, Rfl: 3   EPINEPHrine 0.3 mg/0.3 mL IJ SOAJ injection, Use as directed for anaphylaxis (Patient not taking: Reported on 02/02/2023), Disp: 2 each, Rfl: 1   escitalopram (LEXAPRO) 20 MG tablet, Take 1.5 tablets (30 mg total) by mouth daily., Disp: 45 tablet, Rfl: 1   Semaglutide-Weight Management (WEGOVY) 1.7 MG/0.75ML SOAJ, Inject 1.7 mg as directed once a week. (Patient not taking: Reported on 01/27/2023), Disp: 3 mL, Rfl: 0 Medication Side Effects: none  Family Medical/ Social History: Changes?  Marital probs  MENTAL HEALTH EXAM:  Last menstrual period 12/10/2020.There is no height or weight on file to calculate BMI.  General Appearance: Casual, Well Groomed, and Obese  Eye Contact:  Good  Speech:  Clear and Coherent and Normal Rate  Volume:  Normal  Mood:   sad  Affect:  Congruent  Thought Process:  Goal Directed and Descriptions of Associations: Circumstantial  Orientation:  Full (Time, Place, and Person)  Thought Content: Logical   Suicidal Thoughts:  No  Homicidal Thoughts:  No  Memory:  WNL  Judgement:  Good  Insight:  Good  Psychomotor Activity:  Normal  Concentration:  Concentration: Good and Attention Span: Good  Recall:  Good  Fund of Knowledge: Good  Language: Good  Assets:  Communication Skills Desire for Improvement Financial Resources/Insurance Housing Transportation Vocational/Educational  ADL's:  Intact  Cognition: WNL  Prognosis:  Good   DIAGNOSES:    ICD-10-CM   1. Situational mixed anxiety and depressive disorder  F43.23     2. Marital stress  Z63.0       Receiving Psychotherapy: Yes  [facility] at [facility]  RECOMMENDATIONS:  PDMP reviewed.  Valium filled 09/28/2022  I provided 20 minutes of face to face time during this encounter, including time spent before and after the visit in records review, medical decision making, counseling  pertinent to today's visit, and charting.   We discussed going back to the Cymbalta but because the Lexapro has been effective for the most part we agreed to leave her on this medication however she has been more depressed due to circumstances, even before that though she was feeling down and sensitive.  I recommend increasing the dose.  She understands that 20 mg is the usual maximum FDA approved dose of Lexapro, however up to 40 mg is necessary in some patients according to Dr. Phillips Climes prescribers guide.  She would like to increase the dose.  Continue Valium 2 mg, 1 p.o. every 8 hours as needed anxiety or muscle spasms.  She has plenty, given by another provider. Increase Lexapro 20 mg to 1.5 pills daily. Continue gabapentin 100 mg, 1 p.o. 3 times daily as needed. She and her husband will be starting marriage counseling in the next month or 2. Continue therapy. Return in 6 weeks.  Donnal Moat, PA-C

## 2023-02-02 NOTE — Telephone Encounter (Signed)
Patient said she forgot to mention the possibility of going back on Cymbalta in place of the Lexapro.  Per your note today she agreed to increase dose. She doesn't feel like the Lexapro is as effective as it had been and her oncologist has approved her restarting Cymbalta. She said she can increase Lexapro if you would prefer and re-evaluate at F/U. 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   If Rx sent - [facility].

## 2023-02-02 NOTE — Telephone Encounter (Signed)
Patient lvm stating she was seen in the office on today. Per a conversation during the visit, she was to inquire about a medication with another attending. It is ok to resume the Cymbalta. If you need further information patient stated to reach out to her.  Contact information # [phone]

## 2023-02-03 ENCOUNTER — Other Ambulatory Visit: Payer: Self-pay | Admitting: Physician Assistant

## 2023-02-03 ENCOUNTER — Telehealth: Payer: Self-pay | Admitting: *Deleted

## 2023-02-03 ENCOUNTER — Other Ambulatory Visit (HOSPITAL_COMMUNITY): Payer: Self-pay

## 2023-02-03 MED ORDER — DULOXETINE HCL 30 MG PO CPEP
30.0000 mg | ORAL_CAPSULE | Freq: Every day | ORAL | 0 refills | Status: DC
  Filled 2023-02-03: qty 14, 14d supply, fill #0

## 2023-02-03 MED ORDER — DULOXETINE HCL 60 MG PO CPEP
60.0000 mg | ORAL_CAPSULE | Freq: Every day | ORAL | 1 refills | Status: DC
  Filled 2023-02-03: qty 30, 30d supply, fill #0
  Filled 2023-03-17: qty 30, 30d supply, fill #1

## 2023-02-03 MED ORDER — ESCITALOPRAM OXALATE 20 MG PO TABS
ORAL_TABLET | ORAL | 0 refills | Status: DC
  Filled 2023-02-03: qty 7, fill #0

## 2023-02-03 NOTE — Telephone Encounter (Signed)
Spoke with patient to schedule sx and related appts  

## 2023-02-03 NOTE — H&P (View-Only) (Signed)
Patient ID: Jennifer Davis, female    DOB: 08-30-1976, 47 y.o.   MRN: [id number]  Chief Complaint  Patient presents with   Pre-op Exam      ICD-10-CM   1. S/P breast reconstruction  Z98.890        History of Present Illness: Jennifer Davis is a 47 y.o.  female  with a history of left-sided breast cancer s/p bilateral mastectomy with implant-based reconstruction performed 12/04/2020 and bilateral expander replacement 09/28/2022.  She presents for preoperative evaluation for upcoming procedure, removal of bilateral tissue expanders with placement of bilateral breast implants, scheduled for 02/24/2023 with Dr. Ulice Bold.  The patient has not had problems with anesthesia.  Recent surgeries without complication.  She states that she has moderate asthma, requiring albuterol 3 times per week.  She states that this has been aggravated by seasonal allergies, as expected.  She denies any personal or family history of blood clots or clotting disorder.  She does not smoke or use nicotine-containing products.  NKDA.  No varicosities.  Denies any severe cardiac disease or previous use of anticoagulation.  She plans to hold her Wegovy injection at least 1 week prior to surgery.  She also understands that we will reach out to Dr. Pamelia Hoit for surgical clearance to hold her tamoxifen perioperatively.  She will hold all vitamins/supplements/NSAIDs 1 week prior to surgery.  Surgical clearance by patient's oncologist, Dr. Pamelia Hoit, requesting permission to hold her tamoxifen perioperatively.   Summary of Previous Visit: Patient had left-sided breast cancer s/p bilateral mastectomy with implant-based reconstruction performed 12/04/2020 complicated by right-sided expander removal 03/19/2021.  After continued left-sided expander fills she eventually developed left-sided expander leak.  She has since gone to the OR 09/28/2022 for right-sided tissue expander placement and left-sided expander replacement.  She was last seen  here in clinic on 01/08/2023.  At that time, exam was entirely reassuring.  Slight asymmetry of the expander placement with a right breast inframammary fold being slightly lower causing some increased superior pole fullness on the left side.  550/535 cc saline in each expander.  Discussed moving forward with silicone breast implants.  She was provided with the [facility] implant checklist at visit.  Job: Works at [employer], [employer] conveyancer.  Discussed 2 weeks FMLA.  PMH Significant for: Left-sided breast cancer s/p bilateral mastectomy with implant-based reconstruction 12/04/2020 and bilateral expander replacement 09/28/2022, chemotherapy and left-sided breast radiation, anxiety, asthma.   Past Medical History: Allergies: Allergies  Allergen Reactions   Bee Venom Anaphylaxis and Hives    wheezy   Dog Epithelium (Canis Lupus Familiaris) Shortness Of Breath   Dust Mite Extract Other (See Comments)    sneezing   Grass Pollen(K-O-R-T-Swt Vern) Other (See Comments)    sneezing   Other Itching    environmental allergies sometime activate asthma   Pollen Extract Other (See Comments)    sneezing    Current Medications:  Current Outpatient Medications:    albuterol (VENTOLIN HFA) 108 (90 Base) MCG/ACT inhaler, Inhale 2 puffs into the lungs every 4-6 hours as needed for cough/wheeze, Disp: 6.7 g, Rfl: 0   budesonide-formoterol (SYMBICORT) 160-4.5 MCG/ACT inhaler, Inhale 2 puffs into the lungs 2 (two) times daily., Disp: 10.2 g, Rfl: 5   cephALEXin (KEFLEX) 500 MG capsule, Take 1 capsule (500 mg total) by mouth 4 (four) times daily for 5 days., Disp: 20 capsule, Rfl: 0   cetirizine (ZYRTEC) 10 MG tablet, Take 10 mg by mouth daily., Disp: , Rfl:  Cholecalciferol (VITAMIN D) 50 MCG (2000 UT) tablet, Take 1 tablet (2,000 Units total) by mouth daily., Disp: , Rfl:    diazepam (VALIUM) 2 MG tablet, Take 1 tablet (2 mg total) by mouth every 8 (eight) hours as needed for anxiety., Disp: 30 tablet, Rfl: 0    diphenhydrAMINE (BENADRYL) 25 MG tablet, Take 50 mg by mouth every 6 (six) hours as needed (hives)., Disp: , Rfl:    DULoxetine (CYMBALTA) 30 MG capsule, Take 1 capsule (30 mg total) by mouth daily., Disp: 14 capsule, Rfl: 0   DULoxetine (CYMBALTA) 60 MG capsule, Take 1 capsule (60 mg total) by mouth daily. **Start after taking duloxetine 30 mg daily for 2 weeks., Disp: 30 capsule, Rfl: 1   EPINEPHrine 0.3 mg/0.3 mL IJ SOAJ injection, Use as directed for anaphylaxis, Disp: 2 each, Rfl: 1   escitalopram (LEXAPRO) 20 MG tablet, Take 1/2 tablet (10 mg total) by mouth daily for 7 days, THEN 1/4 tablet (5 mg total) for 7 days then stop., Disp: 7 tablet, Rfl: 0   fluticasone (FLONASE) 50 MCG/ACT nasal spray, Place 2 sprays into both nostrils daily., Disp: 16 g, Rfl: 0   gabapentin (NEURONTIN) 100 MG capsule, Take 1 capsule (100 mg total) by mouth 3 (three) times daily. (Patient taking differently: Take 100 mg by mouth 3 (three) times daily. prn), Disp: 270 capsule, Rfl: 0   ibuprofen (ADVIL) 200 MG tablet, Take 400 mg by mouth every 6 (six) hours as needed for moderate pain., Disp: , Rfl:    melatonin 5 MG TABS, Take 5 mg by mouth at bedtime as needed (sleep)., Disp: , Rfl:    montelukast (SINGULAIR) 10 MG tablet, Take 1 tablet (10 mg total) by mouth every evening., Disp: 30 tablet, Rfl: 5   Multiple Vitamin (MULTI VITAMIN) TABS, 1 tablet Orally Once a day, Disp: , Rfl:    ondansetron (ZOFRAN) 4 MG tablet, Take 1 tablet (4 mg total) by mouth every 8 (eight) hours as needed for up to 20 doses for nausea or vomiting., Disp: 20 tablet, Rfl: 0   ondansetron (ZOFRAN-ODT) 4 MG disintegrating tablet, Take 1 tablet (4 mg total) by mouth every 8 (eight) hours as needed for nausea or vomiting., Disp: 20 tablet, Rfl: 0   oxyCODONE (ROXICODONE) 5 MG immediate release tablet, Take 1 tablet (5 mg total) by mouth every 6 (six) hours as needed for up to 5 days for severe pain., Disp: 20 tablet, Rfl: 0   Probiotic Product  ([employer] PROBIOTIC PO), Take 1 capsule by mouth daily., Disp: , Rfl:    Semaglutide-Weight Management (WEGOVY) 1 MG/0.5ML SOAJ, Inject 1 mg into the skin once a week., Disp: 2 mL, Rfl: 1   Semaglutide-Weight Management (WEGOVY) 1.7 MG/0.75ML SOAJ, Inject 1.7 mg as directed once a week., Disp: 3 mL, Rfl: 0   tamoxifen (NOLVADEX) 10 MG tablet, Take 1 tablet (10 mg total) by mouth daily., Disp: 90 tablet, Rfl: 3  Past Medical Problems: Past Medical History:  Diagnosis Date   Anxiety    Asthma    Asthma due to seasonal allergies    Back pain    Breast cancer (HCC)    Constipation    Depression    Diabetes mellitus without complication (HCC)    Dyspnea    after chemo - fatiqued   Fluid retention    GERD (gastroesophageal reflux disease)    occasionl   History of radiation therapy    left chest wall and subclavian 07/09/2021-08/22/2021   Dr  Antony Blackbird   Joint pain    Lower extremity edema    Morbid obesity (HCC)    Palpitations 2017   tachy    Palpitations 01/09/2016   Prediabetes    PTSD (post-traumatic stress disorder)    SOB (shortness of breath)    Swallowing difficulty     Past Surgical History: Past Surgical History:  Procedure Laterality Date   BREAST RECONSTRUCTION WITH PLACEMENT OF TISSUE EXPANDER AND FLEX HD (ACELLULAR HYDRATED DERMIS) Bilateral 12/04/2020   Procedure: IMMEDIATE BILATERAL BREAST RECONSTRUCTION WITH PLACEMENT OF TISSUE EXPANDER AND FLEX HD (ACELLULAR HYDRATED DERMIS);  Surgeon: Peggye Form, DO;  Location: [facility];  Service: Plastics;  Laterality: Bilateral;   BREAST RECONSTRUCTION WITH PLACEMENT OF TISSUE EXPANDER AND FLEX HD (ACELLULAR HYDRATED DERMIS) Right 09/28/2022   Procedure: BREAST RECONSTRUCTION WITH PLACEMENT OF TISSUE EXPANDER AND FLEX HD (ACELLULAR HYDRATED DERMIS);  Surgeon: Peggye Form, DO;  Location: [facility];  Service: Plastics;  Laterality: Right;   CESAREAN SECTION     x3   DEBRIDEMENT AND CLOSURE WOUND  Right 04/10/2021   Procedure: excision of right breast wound with closure;  Surgeon: Peggye Form, DO;  Location: [facility];  Service: Plastics;  Laterality: Right;   MASTECTOMY WITH AXILLARY LYMPH NODE DISSECTION Left 12/04/2020   Procedure: BILATERAL MASTECTOMIES, RADIOACTIVE SEED GUIDED TARGETED LEFT AXILLARY NODE DISSECTION;  Surgeon: Abigail Miyamoto, MD;  Location: [facility];  Service: General;  Laterality: Left;   PORT-A-CATH REMOVAL Left 08/27/2021   Procedure: REMOVAL PORT-A-CATH;  Surgeon: Abigail Miyamoto, MD;  Location: [facility];  Service: General;  Laterality: Left;   PORTACATH PLACEMENT Left 12/04/2020   Procedure: INSERTION PORT-A-CATH LEFT SUBCLAVIAN;  Surgeon: Abigail Miyamoto, MD;  Location:  [facility];  Service: General;  Laterality: Left;   REMOVAL OF TISSUE EXPANDER AND PLACEMENT OF IMPLANT Right 01/04/2021   Procedure: REMOVAL OF TISSUE EXPANDER AND PLACEMENT OF NEW EXPANDER;  Surgeon: Peggye Form, DO;  Location: [facility];  Service: Plastics;  Laterality: Right;   REMOVAL OF TISSUE EXPANDER AND PLACEMENT OF IMPLANT Left 09/28/2022   Procedure: REMOVAL OF TISSUE EXPANDER AND REPLACEMENT OF EXPANDER;  Surgeon: Peggye Form, DO;  Location: [facility];  Service: Plastics;  Laterality: Left;   TISSUE EXPANDER PLACEMENT Right 03/19/2021   Procedure: Removal of right tissue expander;  Surgeon: Peggye Form, DO;  Location: [facility];  Service: Plastics;  Laterality: Right;  45 min    Social History: Social History   Socioeconomic History   Marital status: Married    Spouse name: Onalee Hua   Number of children: 3   Years of education: associates   Highest education level: Not on file  Occupational History   Occupation: nursing unit clerk    Employer: [employer]  Tobacco Use   Smoking status: Former    Types: Cigarettes    Quit date: 01/09/2012    Years since quitting: 11.0   Smokeless tobacco:  Never   Tobacco comments:    Pt no longer smokes  Vaping Use   Vaping Use: Never used  Substance and Sexual Activity   Alcohol use: Not Currently    Alcohol/week: 0.0 - 2.0 standard drinks of alcohol   Drug use: Never   Sexual activity: Yes  Other Topics Concern   Not on file  Social History Narrative   epworth sleepiness scale = 10 (01/09/2016)   Social Determinants of Health   Financial Resource Strain: Low  Risk  (11/21/2021)   Overall Financial Resource Strain (CARDIA)    Difficulty of Paying Living Expenses: Not very hard  Food Insecurity: No Food Insecurity (11/21/2021)   Hunger Vital Sign    Worried About Running Out of Food in the Last Year: Never true    Ran Out of Food in the Last Year: Never true  Transportation Needs: No Transportation Needs (11/21/2021)   PRAPARE - Administrator, Civil Service (Medical): No    Lack of Transportation (Non-Medical): No  Physical Activity: Inactive (11/21/2021)   Exercise Vital Sign    Days of Exercise per Week: 0 days    Minutes of Exercise per Session: 0 min  Stress: No Stress Concern Present (11/21/2021)   [employer] of Occupational Health - Occupational Stress Questionnaire    Feeling of Stress : Only a little  Social Connections: Moderately Integrated (11/21/2021)   Social Connection and Isolation Panel [NHANES]    Frequency of Communication with Friends and Family: Three times a week    Frequency of Social Gatherings with Friends and Family: Twice a week    Attends Religious Services: 1 to 4 times per year    Active Member of [employer] or Organizations: No    Attends Banker Meetings: Never    Marital Status: Married  Catering manager Violence: Not At Risk (11/21/2021)   Humiliation, Afraid, Rape, and Kick questionnaire    Fear of Current or Ex-Partner: No    Emotionally Abused: No    Physically Abused: No    Sexually Abused: No    Family History: Family History  Problem Relation Age of Onset    Cancer Mother    Thyroid disease Mother    Hyperlipidemia Mother    Hypertension Mother    Fibromyalgia Mother    Colon cancer Mother 23   Depression Mother    Anxiety disorder Mother    Obesity Mother    Asthma Sister        x2   Hypertension Maternal Grandmother    Hypertension Maternal Grandfather    Cancer - Lung Maternal Grandfather    Stroke Maternal Grandfather    Evelene Croon Parkinson White syndrome Maternal Aunt 59   Colon cancer Other        MGM's niece, dx unknown age   Colon cancer Other        MGM's nephew; dx unknown age   Breast cancer Neg Hx     Review of Systems: ROS Denies recent chest pain, difficulty breathing, leg swelling, or fevers.  Physical Exam: Vital Signs BP 125/70 (BP Location: Right Arm, Patient Position: Sitting, Cuff Size: Large)   Pulse (!) 105   LMP 12/10/2020   SpO2 95%   Physical Exam Constitutional:      General: Not in acute distress.    Appearance: Normal appearance. Not ill-appearing.  HENT:     Head: Normocephalic and atraumatic.  Eyes:     Pupils: Pupils are equal, round. Cardiovascular:     Rate and Rhythm: Normal rate.    Pulses: Normal pulses.  Pulmonary:     Effort: No respiratory distress or increased work of breathing.  Speaks in full sentences. Abdominal:     General: Abdomen is flat. No distension.   Musculoskeletal: Normal range of motion. No lower extremity swelling or edema. No varicosities. Skin:    General: Skin is warm and dry.     Findings: No erythema or rash.  Neurological:     Mental Status: Alert  and oriented to person, place, and time.  Psychiatric:        Mood and Affect: Mood normal.        Behavior: Behavior normal.    Assessment/Plan: The patient is scheduled for removal of bilateral expanders with placement of bilateral breast implants with Dr. Ulice Bold.  Risks, benefits, and alternatives of procedure discussed, questions answered and consent obtained.    Smoking Status: Non-smoker;  Counseling Given?  Emphasized the importance of avoiding nicotine as a means to mitigate risk of nicotine related postoperative complications. Last Mammogram: S/p bilateral mastectomy.  Caprini Score: 6; Risk Factors include: Age, BMI greater than 25, and length of planned surgery. Recommendation for mechanical prophylaxis. Encourage early ambulation.   Pictures obtained: 01/08/2023.  Post-op Rx sent to pharmacy: Keflex, Zofran, oxycodone.  Patient was provided with the General Surgical Risk consent document and Pain Medication Agreement prior to their appointment.  They had adequate time to read through the risk consent documents and Pain Medication Agreement. We also discussed them in person together during this preop appointment. All of their questions were answered to their satisfaction.  Recommended calling if they have any further questions.  Risk consent form and Pain Medication Agreement to be scanned into patient's chart.  The risks that can be encountered with and after placement of a breast implant placement were discussed and include the following but not limited to these: bleeding, infection, delayed healing, anesthesia risks, skin sensation changes, injury to structures including nerves, blood vessels, and muscles which may be temporary or permanent, allergies to tape, suture materials and glues, blood products, topical preparations or injected agents, skin contour irregularities, skin discoloration and swelling, deep vein thrombosis, cardiac and pulmonary complications, pain, which may persist, fluid accumulation, wrinkling of the skin over the implant, changes in nipple or breast sensation, implant leakage or rupture, faulty position of the implant, persistent pain, formation of tight scar tissue around the implant (capsular contracture), possible need for revisional surgery or staged procedures.    Electronically signed by: Evelena Leyden, PA-C 02/04/2023 9:11 AM

## 2023-02-03 NOTE — Telephone Encounter (Signed)
For the record, I asked her about it, whether she'd discussed w/ her oncologist or not, she had not, and wanted to increase the Lexapro. Apparently she forgot the conversation.  Have her decrease the Lexapro 20 mg to 1/2 pill for 1 week, then 1/4 pill for 1 wk, then stop. At the same time, start Cymbalta 30 mg which she will take daily for 2 weeks and then 60 mg daily.  I am sending in a prescription for both of those doses. She needs to keep appointment in approximately 6 weeks.

## 2023-02-03 NOTE — Telephone Encounter (Signed)
Patient notified of Rx and recommendations.  

## 2023-02-03 NOTE — Progress Notes (Unsigned)
Patient ID: Jennifer Davis, female    DOB: 1976/11/01, 47 y.o.   MRN: [id number]  No chief complaint on file.   No diagnosis found.   History of Present Illness: Jennifer Davis is a 47 y.o.  female  with a history of left-sided breast cancer s/p bilateral mastectomy with implant-based reconstruction performed 12/04/2020 and bilateral expander replacement 09/28/2022.  She presents for preoperative evaluation for upcoming procedure, removal of bilateral tissue expanders with placement of bilateral breast implants, scheduled for 02/24/2023 with Dr. Marla Roe.  The patient {HAS HAS [id number] had problems with anesthesia. ***  Surgical clearance by patient's oncologist, Dr. Lindi Adie, requesting permission to hold her tamoxifen perioperatively.   Summary of Previous Visit: ***  Job: Works at [employer]  [place] for: Left-sided breast cancer s/p bilateral mastectomy with implant-based reconstruction 12/04/2020 and bilateral expander replacement 09/28/2022, anxiety, asthma.   Past Medical History: Allergies: Allergies  Allergen Reactions   Bee Venom Anaphylaxis and Hives    wheezy   Dog Epithelium (Canis Lupus Familiaris) Shortness Of Breath   Dust Mite Extract Other (See Comments)    sneezing   Grass Pollen(K-O-R-T-Swt Vern) Other (See Comments)    sneezing   Other Itching    environmental allergies sometime activate asthma   Pollen Extract Other (See Comments)    sneezing    Current Medications:  Current Outpatient Medications:    albuterol (VENTOLIN HFA) 108 (90 Base) MCG/ACT inhaler, Inhale 2 puffs into the lungs every 4-6 hours as needed for cough/wheeze, Disp: 6.7 g, Rfl: 0   budesonide-formoterol (SYMBICORT) 160-4.5 MCG/ACT inhaler, Inhale 2 puffs into the lungs 2 (two) times daily., Disp: 10.2 g, Rfl: 5   cetirizine (ZYRTEC) 10 MG tablet, Take 10 mg by mouth daily., Disp: , Rfl:    Cholecalciferol (VITAMIN D) 50 MCG (2000 UT) tablet, Take 1 tablet (2,000 Units  total) by mouth daily., Disp: , Rfl:    diazepam (VALIUM) 2 MG tablet, Take 1 tablet (2 mg total) by mouth every 8 (eight) hours as needed for anxiety., Disp: 30 tablet, Rfl: 0   diphenhydrAMINE (BENADRYL) 25 MG tablet, Take 50 mg by mouth every 6 (six) hours as needed (hives)., Disp: , Rfl:    DULoxetine (CYMBALTA) 30 MG capsule, Take 1 capsule (30 mg total) by mouth daily., Disp: 14 capsule, Rfl: 0   DULoxetine (CYMBALTA) 60 MG capsule, Take 1 capsule (60 mg total) by mouth daily. **Start after taking duloxetine 30 mg daily for 2 weeks., Disp: 30 capsule, Rfl: 1   EPINEPHrine 0.3 mg/0.3 mL IJ SOAJ injection, Use as directed for anaphylaxis (Patient not taking: Reported on 02/02/2023), Disp: 2 each, Rfl: 1   escitalopram (LEXAPRO) 20 MG tablet, Take 1/2 tablet (10 mg total) by mouth daily for 7 days, THEN 1/4 tablet (5 mg total) for 7 days then stop., Disp: 7 tablet, Rfl: 0   fluticasone (FLONASE) 50 MCG/ACT nasal spray, Place 2 sprays into both nostrils daily., Disp: 16 g, Rfl: 0   gabapentin (NEURONTIN) 100 MG capsule, Take 1 capsule (100 mg total) by mouth 3 (three) times daily. (Patient taking differently: Take 100 mg by mouth 3 (three) times daily. prn), Disp: 270 capsule, Rfl: 0   ibuprofen (ADVIL) 200 MG tablet, Take 400 mg by mouth every 6 (six) hours as needed for moderate pain., Disp: , Rfl:    melatonin 5 MG TABS, Take 5 mg by mouth at bedtime as needed (sleep)., Disp: , Rfl:    montelukast (  SINGULAIR) 10 MG tablet, Take 1 tablet (10 mg total) by mouth every evening., Disp: 30 tablet, Rfl: 5   Multiple Vitamin (MULTI VITAMIN) TABS, 1 tablet Orally Once a day, Disp: , Rfl:    ondansetron (ZOFRAN) 4 MG tablet, Take 1 tablet (4 mg total) by mouth every 8 (eight) hours as needed for up to 20 doses for nausea or vomiting., Disp: 20 tablet, Rfl: 0   oxyCODONE (ROXICODONE) 5 MG immediate release tablet, Take 1 tablet (5 mg total) by mouth every 8 (eight) hours as needed for up to 20 doses for  severe pain., Disp: 20 tablet, Rfl: 0   Probiotic Product ([employer] PROBIOTIC PO), Take 1 capsule by mouth daily., Disp: , Rfl:    Semaglutide-Weight Management (WEGOVY) 1 MG/0.5ML SOAJ, Inject 1 mg into the skin once a week., Disp: 2 mL, Rfl: 1   Semaglutide-Weight Management (WEGOVY) 1.7 MG/0.75ML SOAJ, Inject 1.7 mg as directed once a week. (Patient not taking: Reported on 01/27/2023), Disp: 3 mL, Rfl: 0   tamoxifen (NOLVADEX) 10 MG tablet, Take 1 tablet (10 mg total) by mouth daily., Disp: 90 tablet, Rfl: 3  Past Medical Problems: Past Medical History:  Diagnosis Date   Anxiety    Asthma    Asthma due to seasonal allergies    Back pain    Breast cancer ([place])    Constipation    Depression    Diabetes mellitus without complication ([place])    Dyspnea    after chemo - fatiqued   Fluid retention    GERD (gastroesophageal reflux disease)    occasionl   History of radiation therapy    left chest wall and subclavian 07/09/2021-08/22/2021   Dr Gery Pray   Joint pain    Lower extremity edema    Morbid obesity ([place])    Palpitations 2017   tachy    Palpitations 01/09/2016   Prediabetes    PTSD (post-traumatic stress disorder)    SOB (shortness of breath)    Swallowing difficulty     Past Surgical History: Past Surgical History:  Procedure Laterality Date   BREAST RECONSTRUCTION WITH PLACEMENT OF TISSUE EXPANDER AND FLEX HD (ACELLULAR HYDRATED DERMIS) Bilateral 12/04/2020   Procedure: IMMEDIATE BILATERAL BREAST RECONSTRUCTION WITH PLACEMENT OF TISSUE EXPANDER AND FLEX HD (ACELLULAR HYDRATED DERMIS);  Surgeon: Wallace Going, DO;  Location: [place];  Service: Plastics;  Laterality: Bilateral;   BREAST RECONSTRUCTION WITH PLACEMENT OF TISSUE EXPANDER AND FLEX HD (ACELLULAR HYDRATED DERMIS) Right 09/28/2022   Procedure: BREAST RECONSTRUCTION WITH PLACEMENT OF TISSUE EXPANDER AND FLEX HD (ACELLULAR HYDRATED DERMIS);  Surgeon: Wallace Going, DO;  Location: [place];   Service: Plastics;  Laterality: Right;   CESAREAN SECTION     x3   DEBRIDEMENT AND CLOSURE WOUND Right 04/10/2021   Procedure: excision of right breast wound with closure;  Surgeon: Wallace Going, DO;  Location: [place];  Service: Plastics;  Laterality: Right;   MASTECTOMY WITH AXILLARY LYMPH NODE DISSECTION Left 12/04/2020   Procedure: BILATERAL MASTECTOMIES, RADIOACTIVE SEED GUIDED TARGETED LEFT AXILLARY NODE DISSECTION;  Surgeon: Coralie Keens, MD;  Location: [place];  Service: General;  Laterality: Left;   PORT-A-CATH REMOVAL Left 08/27/2021   Procedure: REMOVAL PORT-A-CATH;  Surgeon: Coralie Keens, MD;  Location: [place];  Service: General;  Laterality: Left;   PORTACATH PLACEMENT Left 12/04/2020   Procedure: INSERTION PORT-A-CATH LEFT SUBCLAVIAN;  Surgeon: Coralie Keens, MD;  Location: [place];  Service:  General;  Laterality: Left;   REMOVAL OF TISSUE EXPANDER AND PLACEMENT OF IMPLANT Right 01/04/2021   Procedure: REMOVAL OF TISSUE EXPANDER AND PLACEMENT OF NEW EXPANDER;  Surgeon: Wallace Going, DO;  Location: [place];  Service: Plastics;  Laterality: Right;   REMOVAL OF TISSUE EXPANDER AND PLACEMENT OF IMPLANT Left 09/28/2022   Procedure: REMOVAL OF TISSUE EXPANDER AND REPLACEMENT OF EXPANDER;  Surgeon: Wallace Going, DO;  Location: [place];  Service: Plastics;  Laterality: Left;   TISSUE EXPANDER PLACEMENT Right 03/19/2021   Procedure: Removal of right tissue expander;  Surgeon: Wallace Going, DO;  Location: [place];  Service: Plastics;  Laterality: Right;  45 min    Social History: Social History   Socioeconomic History   Marital status: Married    Spouse name: Shanon Brow   Number of children: 3   Years of education: associates   Highest education level: Not on file  Occupational History   Occupation: nursing unit clerk    Employer: [employer]  Tobacco Use   Smoking status: Former     Types: Cigarettes    Quit date: 01/09/2012    Years since quitting: 11.0   Smokeless tobacco: Never   Tobacco comments:    Pt no longer smokes  Vaping Use   Vaping Use: Never used  Substance and Sexual Activity   Alcohol use: Not Currently    Alcohol/week: 0.0 - 2.0 standard drinks of alcohol   Drug use: Never   Sexual activity: Yes  Other Topics Concern   Not on file  Social History Narrative   epworth sleepiness scale = 10 (01/09/2016)   Social Determinants of Health   Financial Resource Strain: Low Risk  (11/21/2021)   Overall Financial Resource Strain (CARDIA)    Difficulty of Paying Living Expenses: Not very hard  Food Insecurity: No Food Insecurity (11/21/2021)   Hunger Vital Sign    Worried About Running Out of Food in the Last Year: Never true    Ran Out of Food in the Last Year: Never true  Transportation Needs: No Transportation Needs (11/21/2021)   PRAPARE - Hydrologist (Medical): No    Lack of Transportation (Non-Medical): No  Physical Activity: Inactive (11/21/2021)   Exercise Vital Sign    Days of Exercise per Week: 0 days    Minutes of Exercise per Session: 0 min  Stress: No Stress Concern Present (11/21/2021)   [place]    Feeling of Stress : Only a little  Social Connections: Moderately Integrated (11/21/2021)   Social Connection and Isolation Panel [NHANES]    Frequency of Communication with Friends and Family: Three times a week    Frequency of Social Gatherings with Friends and Family: Twice a week    Attends Religious Services: 1 to 4 times per year    Active Member of [employer] or Organizations: No    Attends Archivist Meetings: Never    Marital Status: Married  Human resources officer Violence: Not At Risk (11/21/2021)   Humiliation, Afraid, Rape, and Kick questionnaire    Fear of Current or Ex-Partner: No    Emotionally Abused: No    Physically Abused: No     Sexually Abused: No    Family History: Family History  Problem Relation Age of Onset   Cancer Mother    Thyroid disease Mother    Hyperlipidemia Mother    Hypertension Mother    Fibromyalgia Mother  Colon cancer Mother 17   Depression Mother    Anxiety disorder Mother    Obesity Mother    Asthma Sister        x2   Hypertension Maternal Grandmother    Hypertension Maternal Grandfather    Cancer - Lung Maternal Grandfather    Stroke Maternal Grandfather    Yves Dill Parkinson White syndrome Maternal Aunt 23   Colon cancer Other        MGM's niece, dx unknown age   Colon cancer Other        MGM's nephew; dx unknown age   Breast cancer Neg Hx     Review of Systems: ROS  Physical Exam: Vital Signs LMP 12/10/2020   Physical Exam *** Constitutional:      General: Not in acute distress.    Appearance: Normal appearance. Not ill-appearing.  HENT:     Head: Normocephalic and atraumatic.  Eyes:     Pupils: Pupils are equal, round. Cardiovascular:     Rate and Rhythm: Normal rate.    Pulses: Normal pulses.  Pulmonary:     Effort: No respiratory distress or increased work of breathing.  Speaks in full sentences. Abdominal:     General: Abdomen is flat. No distension.   Musculoskeletal: Normal range of motion. No lower extremity swelling or edema. No varicosities. *** Skin:    General: Skin is warm and dry.     Findings: No erythema or rash.  Neurological:     Mental Status: Alert and oriented to person, place, and time.  Psychiatric:        Mood and Affect: Mood normal.        Behavior: Behavior normal.    Assessment/Plan: The patient is scheduled for removal of bilateral expanders with placement of bilateral breast implants with Dr. Marla Roe.  Risks, benefits, and alternatives of procedure discussed, questions answered and consent obtained.    Smoking Status: ***; Counseling Given? *** Last Mammogram: S/p bilateral mastectomy  Caprini Score: ***; Risk  Factors include: ***, BMI *** 25, and length of planned surgery. Recommendation for mechanical *** pharmacological prophylaxis. Encourage early ambulation.   Pictures obtained: ***  Post-op Rx sent to pharmacy: ***  Patient was provided with the General Surgical Risk consent document and Pain Medication Agreement prior to their appointment.  They had adequate time to read through the risk consent documents and Pain Medication Agreement. We also discussed them in person together during this preop appointment. All of their questions were answered to their satisfaction.  Recommended calling if they have any further questions.  Risk consent form and Pain Medication Agreement to be scanned into patient's chart.  The risks that can be encountered with and after placement of a breast implant placement were discussed and include the following but not limited to these: bleeding, infection, delayed healing, anesthesia risks, skin sensation changes, injury to structures including nerves, blood vessels, and muscles which may be temporary or permanent, allergies to tape, suture materials and glues, blood products, topical preparations or injected agents, skin contour irregularities, skin discoloration and swelling, deep vein thrombosis, cardiac and pulmonary complications, pain, which may persist, fluid accumulation, wrinkling of the skin over the implant, changes in nipple or breast sensation, implant leakage or rupture, faulty position of the implant, persistent pain, formation of tight scar tissue around the implant (capsular contracture), possible need for revisional surgery or staged procedures.    Electronically signed by: Krista Blue, PA-C 02/03/2023 3:38 PM

## 2023-02-04 ENCOUNTER — Other Ambulatory Visit (HOSPITAL_COMMUNITY): Payer: Self-pay

## 2023-02-04 ENCOUNTER — Telehealth: Payer: Self-pay | Admitting: *Deleted

## 2023-02-04 ENCOUNTER — Ambulatory Visit (INDEPENDENT_AMBULATORY_CARE_PROVIDER_SITE_OTHER): Payer: Commercial Managed Care - PPO | Admitting: Physician Assistant

## 2023-02-04 VITALS — BP 125/70 | HR 105

## 2023-02-04 DIAGNOSIS — Z9889 Other specified postprocedural states: Secondary | ICD-10-CM

## 2023-02-04 DIAGNOSIS — F4312 Post-traumatic stress disorder, chronic: Secondary | ICD-10-CM | POA: Diagnosis not present

## 2023-02-04 MED ORDER — OXYCODONE HCL 5 MG PO TABS
5.0000 mg | ORAL_TABLET | Freq: Four times a day (QID) | ORAL | 0 refills | Status: AC | PRN
  Filled 2023-02-04: qty 20, 5d supply, fill #0

## 2023-02-04 MED ORDER — CEPHALEXIN 500 MG PO CAPS
500.0000 mg | ORAL_CAPSULE | Freq: Four times a day (QID) | ORAL | 0 refills | Status: AC
  Filled 2023-02-04: qty 20, 5d supply, fill #0

## 2023-02-04 MED ORDER — ONDANSETRON 4 MG PO TBDP
4.0000 mg | ORAL_TABLET | Freq: Three times a day (TID) | ORAL | 0 refills | Status: DC | PRN
  Filled 2023-02-04: qty 20, 7d supply, fill #0

## 2023-02-04 NOTE — Telephone Encounter (Signed)
Surgical clearance request for pt to hold tamoxifen 2 weeks pre and post surgery OR alternate recommendations faxed to Dr. Lindi Adie with fax confirmation received.

## 2023-02-04 NOTE — Progress Notes (Signed)
Surgical clearance for removal of tissue expander and placement of implant faxed to [phone] with receipt confirmation. Called Pt and gave instructions to hold tamoxifen for 2 weeks before and 2 weeks after surgery. Pt verbalized understanding.

## 2023-02-08 DIAGNOSIS — F4312 Post-traumatic stress disorder, chronic: Secondary | ICD-10-CM | POA: Diagnosis not present

## 2023-02-12 DIAGNOSIS — F4312 Post-traumatic stress disorder, chronic: Secondary | ICD-10-CM | POA: Diagnosis not present

## 2023-02-14 DIAGNOSIS — F4312 Post-traumatic stress disorder, chronic: Secondary | ICD-10-CM | POA: Diagnosis not present

## 2023-02-16 ENCOUNTER — Encounter: Payer: Self-pay | Admitting: *Deleted

## 2023-02-16 ENCOUNTER — Other Ambulatory Visit (HOSPITAL_COMMUNITY): Payer: Self-pay

## 2023-02-16 NOTE — Progress Notes (Signed)
RN successfully re-faxed signed surgical clearance to [phone].

## 2023-02-17 ENCOUNTER — Ambulatory Visit (INDEPENDENT_AMBULATORY_CARE_PROVIDER_SITE_OTHER): Payer: Commercial Managed Care - PPO | Admitting: Student

## 2023-02-17 DIAGNOSIS — R102 Pelvic and perineal pain: Secondary | ICD-10-CM | POA: Diagnosis not present

## 2023-02-17 DIAGNOSIS — N898 Other specified noninflammatory disorders of vagina: Secondary | ICD-10-CM | POA: Diagnosis not present

## 2023-02-17 DIAGNOSIS — Z719 Counseling, unspecified: Secondary | ICD-10-CM

## 2023-02-17 DIAGNOSIS — Z113 Encounter for screening for infections with a predominantly sexual mode of transmission: Secondary | ICD-10-CM | POA: Diagnosis not present

## 2023-02-17 NOTE — Progress Notes (Signed)
The patient reports she is doing well today.  She states that most of her cherry angiomas have gone away and she feels that her pigment has really lightened up.  She states that she is happy with her results.  Patient states that she is planning on undergoing halo laser in the fall.   Preoperative Dx: hyperpigmentation to face, cherry angioma to face  Postoperative Dx:  same  Procedure: laser to face   Anesthesia: none  Description of Procedure:  Risks and complications were explained to the patient. Consent was confirmed. Time out was called and all information was confirmed to be correct. The area  area was prepped with alcohol and wiped dry. Eye protection was placed on the patient's eyes.  Primer Pass: BBL Forever Plus laser was set at 7 J/cm2 using 590 nm filter with 3 pulse width.  The face was lasered.    Corrective: The BBL laser was then set to 21 J/cm using the 560 nm filter with 19 pulse width.  Using the 7 mm adapter, the cherry angioma to the patient's left lateral forehead was lasered.  The BBL laser was then set to 14 J/cm using the 515 nm filter with 15 pulse width.  Several light areas of pigmentation were lasered with the 7 mm adapter.  The BBL laser was then sent to 13 J/cm using the 515 nm filter with 15 pulse width.  Several areas of dark pigmentation were lasered using the 7 mm adapter.  Area of dark pigmentation near patient's left alar crease was not lasered per patient's request.   The patient tolerated the procedure well and there were no complications.  Postprocedure care was discussed with the patient.  Patient to follow-up as needed.

## 2023-02-18 ENCOUNTER — Encounter (HOSPITAL_BASED_OUTPATIENT_CLINIC_OR_DEPARTMENT_OTHER): Payer: Self-pay | Admitting: Plastic Surgery

## 2023-02-18 ENCOUNTER — Encounter (HOSPITAL_BASED_OUTPATIENT_CLINIC_OR_DEPARTMENT_OTHER)
Admission: RE | Admit: 2023-02-18 | Discharge: 2023-02-18 | Disposition: A | Discharge status: Home / Self Care | Payer: Commercial Managed Care - PPO | Source: Ambulatory Visit | Attending: Plastic Surgery | Admitting: Plastic Surgery

## 2023-02-18 ENCOUNTER — Other Ambulatory Visit: Payer: Self-pay

## 2023-02-18 ENCOUNTER — Telehealth: Payer: Self-pay | Admitting: Plastic Surgery

## 2023-02-18 DIAGNOSIS — Z01812 Encounter for preprocedural laboratory examination: Secondary | ICD-10-CM | POA: Diagnosis not present

## 2023-02-18 DIAGNOSIS — F4312 Post-traumatic stress disorder, chronic: Secondary | ICD-10-CM | POA: Diagnosis not present

## 2023-02-18 LAB — BASIC METABOLIC PANEL
Anion gap: 8 (ref 5–15)
BUN: 10 mg/dL (ref 6–20)
CO2: 25 mmol/L (ref 22–32)
Calcium: 8.9 mg/dL (ref 8.9–10.3)
Chloride: 105 mmol/L (ref 98–111)
Creatinine, Ser: 0.82 mg/dL (ref 0.44–1.00)
GFR, Estimated: >60 mL/min (ref >60)
Glucose, Bld: 122 mg/dL — ABNORMAL HIGH (ref 70–99)
Potassium: 4.1 mmol/L (ref 3.5–5.1)
Sodium: 138 mmol/L (ref 135–145)

## 2023-02-18 NOTE — Progress Notes (Signed)
   02/18/23 1213  PAT Phone Screen  Is the patient taking a GLP-1 receptor agonist? Yes  Has the patient been informed on holding medication? (S)  Yes (last dose 01/19/23)  Do You Have Diabetes? Yes  Do You Have Hypertension? No  Have You Ever Been to the ER for Asthma? No  Have You Taken Oral Steroids in the Past 3 Months? No  Do you Take Phenteramine or any Other Diet Drugs? No  Recent  Lab Work, EKG, CXR? No  Do you have a history of heart problems? No  Any Recent Hospitalizations? Yes  Height 4\' 11"  (1.499 m)  Weight 73 kg  Pat Appointment Scheduled Yes ([facility])

## 2023-02-18 NOTE — Telephone Encounter (Signed)
Faxed completed [employer] forms to [phone]. Sent forms to scan.

## 2023-02-18 NOTE — Telephone Encounter (Signed)
REF [phone] [employer].

## 2023-02-22 ENCOUNTER — Other Ambulatory Visit (HOSPITAL_COMMUNITY): Payer: Self-pay

## 2023-02-22 ENCOUNTER — Other Ambulatory Visit: Payer: Self-pay

## 2023-02-22 MED ORDER — ALBUTEROL SULFATE HFA 108 (90 BASE) MCG/ACT IN AERS
2.0000 | INHALATION_SPRAY | RESPIRATORY_TRACT | 0 refills | Status: DC | PRN
  Filled 2023-02-22: qty 6.7, 17d supply, fill #0

## 2023-02-23 DIAGNOSIS — F4312 Post-traumatic stress disorder, chronic: Secondary | ICD-10-CM | POA: Diagnosis not present

## 2023-02-24 ENCOUNTER — Encounter (HOSPITAL_BASED_OUTPATIENT_CLINIC_OR_DEPARTMENT_OTHER): Payer: Self-pay | Admitting: Plastic Surgery

## 2023-02-24 ENCOUNTER — Other Ambulatory Visit: Payer: Self-pay

## 2023-02-24 ENCOUNTER — Ambulatory Visit (HOSPITAL_BASED_OUTPATIENT_CLINIC_OR_DEPARTMENT_OTHER): Payer: Commercial Managed Care - PPO | Admitting: Certified Registered Nurse Anesthetist

## 2023-02-24 ENCOUNTER — Ambulatory Visit (HOSPITAL_BASED_OUTPATIENT_CLINIC_OR_DEPARTMENT_OTHER)
Admission: RE | Admit: 2023-02-24 | Discharge: 2023-02-24 | Disposition: A | Discharge status: Home / Self Care | Payer: Commercial Managed Care - PPO | Attending: Plastic Surgery | Admitting: Plastic Surgery

## 2023-02-24 ENCOUNTER — Encounter (HOSPITAL_BASED_OUTPATIENT_CLINIC_OR_DEPARTMENT_OTHER)
Admission: RE | Disposition: A | Discharge status: Home / Self Care | Payer: Self-pay | Source: Home / Self Care | Attending: Plastic Surgery

## 2023-02-24 DIAGNOSIS — F419 Anxiety disorder, unspecified: Secondary | ICD-10-CM | POA: Diagnosis not present

## 2023-02-24 DIAGNOSIS — Z87891 Personal history of nicotine dependence: Secondary | ICD-10-CM | POA: Insufficient documentation

## 2023-02-24 DIAGNOSIS — Z7985 Long-term (current) use of injectable non-insulin antidiabetic drugs: Secondary | ICD-10-CM | POA: Insufficient documentation

## 2023-02-24 DIAGNOSIS — Z45812 Encounter for adjustment or removal of left breast implant: Secondary | ICD-10-CM

## 2023-02-24 DIAGNOSIS — J45909 Unspecified asthma, uncomplicated: Secondary | ICD-10-CM | POA: Diagnosis not present

## 2023-02-24 DIAGNOSIS — Z45811 Encounter for adjustment or removal of right breast implant: Secondary | ICD-10-CM

## 2023-02-24 DIAGNOSIS — Z853 Personal history of malignant neoplasm of breast: Secondary | ICD-10-CM

## 2023-02-24 DIAGNOSIS — Z421 Encounter for breast reconstruction following mastectomy: Secondary | ICD-10-CM | POA: Insufficient documentation

## 2023-02-24 DIAGNOSIS — Z9013 Acquired absence of bilateral breasts and nipples: Secondary | ICD-10-CM | POA: Diagnosis not present

## 2023-02-24 DIAGNOSIS — E119 Type 2 diabetes mellitus without complications: Secondary | ICD-10-CM | POA: Diagnosis not present

## 2023-02-24 DIAGNOSIS — F32A Depression, unspecified: Secondary | ICD-10-CM | POA: Insufficient documentation

## 2023-02-24 HISTORY — PX: REMOVAL OF TISSUE EXPANDER AND PLACEMENT OF IMPLANT: SHX6457

## 2023-02-24 LAB — GLUCOSE, CAPILLARY
Glucose-Capillary: 107 mg/dL — ABNORMAL HIGH (ref 70–99)
Glucose-Capillary: 116 mg/dL — ABNORMAL HIGH (ref 70–99)

## 2023-02-24 SURGERY — REMOVAL, TISSUE EXPANDER, BREAST, WITH IMPLANT INSERTION
Anesthesia: General | Site: Breast | Laterality: Bilateral

## 2023-02-24 MED ORDER — OXYCODONE HCL 5 MG/5ML PO SOLN: 5.0000 mg | Freq: Once | ORAL | Status: DC | PRN

## 2023-02-24 MED ORDER — PROPOFOL 10 MG/ML IV BOLUS
INTRAVENOUS | Status: DC | PRN
  Administered 2023-02-24: 60 mg via INTRAVENOUS

## 2023-02-24 MED ORDER — PROPOFOL 10 MG/ML IV BOLUS
INTRAVENOUS | Status: AC
  Filled 2023-02-24: qty 20

## 2023-02-24 MED ORDER — MIDAZOLAM HCL 2 MG/2ML IJ SOLN
INTRAMUSCULAR | Status: AC
  Filled 2023-02-24: qty 2

## 2023-02-24 MED ORDER — FENTANYL CITRATE (PF) 100 MCG/2ML IJ SOLN
INTRAMUSCULAR | Status: AC
  Filled 2023-02-24: qty 2

## 2023-02-24 MED ORDER — LACTATED RINGERS IV SOLN: INTRAVENOUS | Status: DC

## 2023-02-24 MED ORDER — SODIUM CHLORIDE 0.9 % IV SOLN
INTRAVENOUS | Status: AC
  Filled 2023-02-24: qty 10

## 2023-02-24 MED ORDER — CEFAZOLIN SODIUM-DEXTROSE 2-4 GM/100ML-% IV SOLN
INTRAVENOUS | Status: AC
  Filled 2023-02-24: qty 100

## 2023-02-24 MED ORDER — ACETAMINOPHEN 325 MG RE SUPP: 650.0000 mg | RECTAL | Status: DC | PRN

## 2023-02-24 MED ORDER — DEXAMETHASONE SODIUM PHOSPHATE 10 MG/ML IJ SOLN
INTRAMUSCULAR | Status: AC
  Filled 2023-02-24: qty 1

## 2023-02-24 MED ORDER — ONDANSETRON HCL 4 MG/2ML IJ SOLN
INTRAMUSCULAR | Status: AC
  Filled 2023-02-24: qty 2

## 2023-02-24 MED ORDER — LIDOCAINE 2% (20 MG/ML) 5 ML SYRINGE
INTRAMUSCULAR | Status: DC | PRN
  Administered 2023-02-24: 60 mg via INTRAVENOUS

## 2023-02-24 MED ORDER — MIDAZOLAM HCL 5 MG/5ML IJ SOLN
INTRAMUSCULAR | Status: DC | PRN
  Administered 2023-02-24: 2 mg via INTRAVENOUS

## 2023-02-24 MED ORDER — AMISULPRIDE (ANTIEMETIC) 5 MG/2ML IV SOLN: 10.0000 mg | Freq: Once | INTRAVENOUS | Status: DC | PRN

## 2023-02-24 MED ORDER — SODIUM CHLORIDE 0.9 % IV SOLN: 250.0000 mL | INTRAVENOUS | Status: DC | PRN

## 2023-02-24 MED ORDER — OXYCODONE HCL 5 MG PO TABS: 5.0000 mg | ORAL_TABLET | ORAL | Status: DC | PRN

## 2023-02-24 MED ORDER — BUPIVACAINE HCL (PF) 0.25 % IJ SOLN
INTRAMUSCULAR | Status: DC | PRN
  Administered 2023-02-24: 20 mL

## 2023-02-24 MED ORDER — CEFAZOLIN SODIUM-DEXTROSE 2-4 GM/100ML-% IV SOLN
2.0000 g | INTRAVENOUS | Status: AC
  Administered 2023-02-24: 2 g via INTRAVENOUS

## 2023-02-24 MED ORDER — ACETAMINOPHEN 500 MG PO TABS
ORAL_TABLET | ORAL | Status: AC
  Filled 2023-02-24: qty 2

## 2023-02-24 MED ORDER — FENTANYL CITRATE (PF) 100 MCG/2ML IJ SOLN
25.0000 ug | INTRAMUSCULAR | Status: DC | PRN
  Administered 2023-02-24: 50 ug via INTRAVENOUS
  Administered 2023-02-24: 25 ug via INTRAVENOUS

## 2023-02-24 MED ORDER — PROMETHAZINE HCL 25 MG/ML IJ SOLN: 6.2500 mg | INTRAMUSCULAR | Status: DC | PRN

## 2023-02-24 MED ORDER — LIDOCAINE-EPINEPHRINE 1 %-1:100000 IJ SOLN
INTRAMUSCULAR | Status: DC | PRN
  Administered 2023-02-24: 20 mL

## 2023-02-24 MED ORDER — LIDOCAINE 2% (20 MG/ML) 5 ML SYRINGE
INTRAMUSCULAR | Status: AC
  Filled 2023-02-24: qty 5

## 2023-02-24 MED ORDER — DEXAMETHASONE SODIUM PHOSPHATE 10 MG/ML IJ SOLN
INTRAMUSCULAR | Status: DC | PRN
  Administered 2023-02-24: 10 mg via INTRAVENOUS

## 2023-02-24 MED ORDER — ACETAMINOPHEN 325 MG PO TABS: 650.0000 mg | ORAL_TABLET | ORAL | Status: DC | PRN

## 2023-02-24 MED ORDER — OXYCODONE HCL 5 MG PO TABS: 5.0000 mg | ORAL_TABLET | Freq: Once | ORAL | Status: DC | PRN

## 2023-02-24 MED ORDER — KETOROLAC TROMETHAMINE 30 MG/ML IJ SOLN: 30.0000 mg | Freq: Once | INTRAMUSCULAR | Status: DC | PRN

## 2023-02-24 MED ORDER — FENTANYL CITRATE (PF) 100 MCG/2ML IJ SOLN: 25.0000 ug | INTRAMUSCULAR | Status: DC | PRN

## 2023-02-24 MED ORDER — SODIUM CHLORIDE 0.9% FLUSH: 3.0000 mL | Freq: Two times a day (BID) | INTRAVENOUS | Status: DC

## 2023-02-24 MED ORDER — CHLORHEXIDINE GLUCONATE CLOTH 2 % EX PADS: 6.0000 | MEDICATED_PAD | Freq: Once | CUTANEOUS | Status: DC

## 2023-02-24 MED ORDER — SODIUM CHLORIDE 0.9% FLUSH: 3.0000 mL | INTRAVENOUS | Status: DC | PRN

## 2023-02-24 MED ORDER — FENTANYL CITRATE (PF) 100 MCG/2ML IJ SOLN
INTRAMUSCULAR | Status: DC | PRN
  Administered 2023-02-24 (×4): 50 ug via INTRAVENOUS

## 2023-02-24 MED ORDER — EPHEDRINE 5 MG/ML INJ
INTRAVENOUS | Status: AC
  Filled 2023-02-24: qty 5

## 2023-02-24 MED ORDER — EPHEDRINE SULFATE-NACL 50-0.9 MG/10ML-% IV SOSY
PREFILLED_SYRINGE | INTRAVENOUS | Status: DC | PRN
  Administered 2023-02-24: 10 mg via INTRAVENOUS

## 2023-02-24 MED ORDER — ACETAMINOPHEN 500 MG PO TABS
1000.0000 mg | ORAL_TABLET | Freq: Once | ORAL | Status: AC
  Administered 2023-02-24: 1000 mg via ORAL

## 2023-02-24 MED ORDER — ONDANSETRON HCL 4 MG/2ML IJ SOLN
INTRAMUSCULAR | Status: DC | PRN
  Administered 2023-02-24: 4 mg via INTRAVENOUS

## 2023-02-24 SURGICAL SUPPLY — 70 items
ADH SKN CLS APL DERMABOND .7 (GAUZE/BANDAGES/DRESSINGS)
BAG DECANTER FOR FLEXI CONT (MISCELLANEOUS) ×1 IMPLANT
BINDER BREAST LRG (GAUZE/BANDAGES/DRESSINGS) IMPLANT
BINDER BREAST MEDIUM (GAUZE/BANDAGES/DRESSINGS) IMPLANT
BINDER BREAST XLRG (GAUZE/BANDAGES/DRESSINGS) IMPLANT
BINDER BREAST XXLRG (GAUZE/BANDAGES/DRESSINGS) IMPLANT
BIOPATCH RED 1 DISK 7.0 (GAUZE/BANDAGES/DRESSINGS) IMPLANT
BLADE SURG 15 STRL LF DISP TIS (BLADE) ×1 IMPLANT
BLADE SURG 15 STRL SS (BLADE) ×1
CANISTER SUCT 1200ML W/VALVE (MISCELLANEOUS) ×1 IMPLANT
COVER BACK TABLE 60X90IN (DRAPES) ×1 IMPLANT
COVER MAYO STAND STRL (DRAPES) ×1 IMPLANT
DERMABOND ADVANCED .7 DNX12 (GAUZE/BANDAGES/DRESSINGS) IMPLANT
DRAIN CHANNEL 19F RND (DRAIN) IMPLANT
DRAPE LAPAROSCOPIC ABDOMINAL (DRAPES) ×1 IMPLANT
DRSG MEPILEX POST OP 4X8 (GAUZE/BANDAGES/DRESSINGS) ×2 IMPLANT
ELECT BLADE 4.0 EZ CLEAN MEGAD (MISCELLANEOUS) ×1
ELECT COATED BLADE 2.86 ST (ELECTRODE) ×1 IMPLANT
ELECT REM PT RETURN 9FT ADLT (ELECTROSURGICAL) ×1
ELECTRODE BLDE 4.0 EZ CLN MEGD (MISCELLANEOUS) ×1 IMPLANT
ELECTRODE REM PT RTRN 9FT ADLT (ELECTROSURGICAL) ×1 IMPLANT
EVACUATOR SILICONE 100CC (DRAIN) IMPLANT
FUNNEL KELLER 2 DISP (MISCELLANEOUS) IMPLANT
GAUZE PAD ABD 8X10 STRL (GAUZE/BANDAGES/DRESSINGS) ×2 IMPLANT
GAUZE SPONGE 4X4 12PLY STRL LF (GAUZE/BANDAGES/DRESSINGS) IMPLANT
GLOVE BIO SURGEON STRL SZ 6.5 (GLOVE) ×2 IMPLANT
GLOVE BIO SURGEON STRL SZ7.5 (GLOVE) ×1 IMPLANT
GLOVE SURG SYN 8.0 (GLOVE) ×2 IMPLANT
GLOVE SURG SYN 8.0 PF PI (GLOVE) IMPLANT
GOWN STRL REUS W/ TWL LRG LVL3 (GOWN DISPOSABLE) ×3 IMPLANT
GOWN STRL REUS W/TWL LRG LVL3 (GOWN DISPOSABLE) ×3
IMPL GEL HP 590CC (Breast) IMPLANT
IMPLANT GEL HP 590CC (Breast) ×2 IMPLANT
IV NS 1000ML (IV SOLUTION)
IV NS 1000ML BAXH (IV SOLUTION) IMPLANT
NDL HYPO 18GX1.5 BLUNT FILL (NEEDLE) IMPLANT
NDL HYPO 25X1 1.5 SAFETY (NEEDLE) ×1 IMPLANT
NDL SAFETY ECLIP 18X1.5 (MISCELLANEOUS) ×1 IMPLANT
NEEDLE HYPO 18GX1.5 BLUNT FILL (NEEDLE) IMPLANT
NEEDLE HYPO 25X1 1.5 SAFETY (NEEDLE) ×1 IMPLANT
PACK BASIN DAY SURGERY FS (CUSTOM PROCEDURE TRAY) ×1 IMPLANT
PENCIL SMOKE EVACUATOR (MISCELLANEOUS) ×1 IMPLANT
PIN SAFETY STERILE (MISCELLANEOUS) IMPLANT
SIZER BREAST REUSE 535CC (SIZER) ×1
SIZER BREAST REUSE 590CC (SIZER) ×1
SIZER BRST REUSE 590CC (SIZER) IMPLANT
SIZER BRST REUSE ULT HI 535CC (SIZER) IMPLANT
SLEEVE SCD COMPRESS KNEE MED (STOCKING) ×1 IMPLANT
SPIKE FLUID TRANSFER (MISCELLANEOUS) IMPLANT
SPONGE T-LAP 18X18 ~~LOC~~+RFID (SPONGE) ×2 IMPLANT
STRIP SUTURE WOUND CLOSURE 1/2 (MISCELLANEOUS) IMPLANT
SUT MNCRL AB 3-0 PS2 18 (SUTURE) IMPLANT
SUT MNCRL AB 4-0 PS2 18 (SUTURE) IMPLANT
SUT MON AB 3-0 SH 27 (SUTURE)
SUT MON AB 3-0 SH27 (SUTURE) IMPLANT
SUT MON AB 5-0 PS2 18 (SUTURE) IMPLANT
SUT PDS 3-0 CT2 (SUTURE) ×4
SUT PDS AB 2-0 CT2 27 (SUTURE) IMPLANT
SUT PDS II 3-0 CT2 27 ABS (SUTURE) IMPLANT
SUT SILK 3 0 PS 1 (SUTURE) IMPLANT
SUT VIC AB 3-0 SH 27 (SUTURE)
SUT VIC AB 3-0 SH 27X BRD (SUTURE) IMPLANT
SUT VICRYL 4-0 PS2 18IN ABS (SUTURE) IMPLANT
SYR BULB IRRIG 60ML STRL (SYRINGE) ×1 IMPLANT
SYR CONTROL 10ML LL (SYRINGE) ×1 IMPLANT
TOWEL GREEN STERILE FF (TOWEL DISPOSABLE) ×2 IMPLANT
TRAY DSU PREP LF (CUSTOM PROCEDURE TRAY) ×1 IMPLANT
TUBE CONNECTING 20X1/4 (TUBING) ×1 IMPLANT
UNDERPAD 30X36 HEAVY ABSORB (UNDERPADS AND DIAPERS) ×2 IMPLANT
YANKAUER SUCT BULB TIP NO VENT (SUCTIONS) ×1 IMPLANT

## 2023-02-24 NOTE — Discharge Instructions (Addendum)
INSTRUCTIONS FOR AFTER BREAST SURGERY   You will likely have some questions about what to expect following your operation.  The following information will help you and your family understand what to expect when you are discharged from the hospital.  It is important to follow these guidelines to help ensure a smooth recovery and reduce complication.  Postoperative instructions include information on: diet, wound care, medications and physical activity.  AFTER SURGERY Expect to go home after the procedure.  In some cases, you may need to spend one night in the hospital for observation.  DIET Breast surgery does not require a specific diet.  However, the healthier you eat the better your body will heal. It is important to increasing your protein intake.  This means limiting the foods with sugar and carbohydrates.  Focus on vegetables and some meat.  If you have liposuction during your procedure be sure to drink water.  If your urine is bright yellow, then it is concentrated, and you need to drink more water.  As a general rule after surgery, you should have 8 ounces of water every hour while awake.  If you find you are persistently nauseated or unable to take in liquids let us know.  NO TOBACCO USE or EXPOSURE.  This will slow your healing process and lead to a wound.  WOUND CARE Leave the binder on for 3 days . Use fragrance free soap like Dial, Dove or [place].   After 3 days you can remove the binder to shower. Once dry apply binder or sports bra. If you have liposuction you will have a soft and spongy dressing (Lipofoam) that helps prevent creases in your skin.  Remove before you shower and then replace it.  It is also available on [employer]. If you have steri-strips / tape directly attached to your skin leave them in place. It is OK to get these wet.   No baths, pools or hot tubs for four weeks. We close your incision to leave the smallest and best-looking scar. No ointment or creams on your incisions  for four weeks.  No Neosporin (Too many skin reactions).  A few weeks after surgery you can use Mederma and start massaging the scar. We ask you to wear your binder or sports bra for the first 6 weeks around the clock, including while sleeping. This provides added comfort and helps reduce the fluid accumulation at the surgery site. NO Ice or heating pads to the operative site.  You have a very high risk of a BURN before you feel the temperature change.  ACTIVITY No heavy lifting until cleared by the doctor.  This usually means no more than a half-gallon of milk.  It is OK to walk and climb stairs. Moving your legs is very important to decrease your risk of a blood clot.  It will also help keep you from getting deconditioned.  Every 1 to 2 hours get up and walk for 5 minutes. This will help with a quicker recovery back to normal.  Let pain be your guide so you don't do too much.  This time is for you to recover.  You will be more comfortable if you sleep and rest with your head elevated either with a few pillows under you or in a recliner.  No stomach sleeping for a three months.  WORK Everyone returns to work at different times. As a rough guide, most people take at least 1 - 2 weeks off prior to returning to work. If  you need documentation for your job, give the forms to the front staff at the clinic.  DRIVING Arrange for someone to bring you home from the hospital after your surgery.  You may be able to drive a few days after surgery but not while taking any narcotics or valium.  BOWEL MOVEMENTS Constipation can occur after anesthesia and while taking pain medication.  It is important to stay ahead for your comfort.  We recommend taking Milk of Magnesia (2 tablespoons; twice a day) while taking the pain pills.  MEDICATIONS You may be prescribed should start after surgery At your preoperative visit for you history and physical you may have been given the following medications: An antibiotic: Start  this medication when you get home and take according to the instructions on the bottle. Zofran 4 mg:  This is to treat nausea and vomiting.  You can take this every 6 hours as needed and only if needed. Valium 2 mg for breast cancer patients: This is for muscle tightness if you have an implant or expander. This will help relax your muscle which also helps with pain control.  This can be taken every 12 hours as needed. Don't drive after taking this medication. Norco (hydrocodone/acetaminophen) 5/325 mg:  This is only to be used after you have taken the Motrin or the Tylenol. Every 8 hours as needed.   Over the counter Medication to take: Ibuprofen (Motrin) 600 mg:  Take this every 6 hours.  If you have additional pain then take 500 mg of the Tylenol every 8 hours.  Only take the Norco after you have tried these two. MiraLAX or Milk of Magnesia: Take this according to the bottle if you take the Norco.  WHEN TO CALL Call your surgeon's office if any of the following occur: Fever 101 degrees F or greater Excessive bleeding or fluid from the incision site. Pain that increases over time without aid from the medications Redness, warmth, or pus draining from incision sites Persistent nausea or inability to take in liquids Severe misshapen area that underwent the operation.   Post Anesthesia Home Care Instructions  Activity: Get plenty of rest for the remainder of the day. A responsible individual must stay with you for 24 hours following the procedure.  For the next 24 hours, DO NOT: -Drive a car -Advertising copywriter -Drink alcoholic beverages -Take any medication unless instructed by your physician -Make any legal decisions or sign important papers.  Meals: Start with liquid foods such as gelatin or soup. Progress to regular foods as tolerated. Avoid greasy, spicy, heavy foods. If nausea and/or vomiting occur, drink only clear liquids until the nausea and/or vomiting subsides. Call your  physician if vomiting continues.  Special Instructions/Symptoms: Your throat may feel dry or sore from the anesthesia or the breathing tube placed in your throat during surgery. If this causes discomfort, gargle with warm salt water. The discomfort should disappear within 24 hours.  Can take tylenol if needed after 4 pm

## 2023-02-24 NOTE — Anesthesia Preprocedure Evaluation (Addendum)
Anesthesia Evaluation  Patient identified by MRN, date of birth, ID band Patient awake    Reviewed: Allergy & Precautions, NPO status , Patient's Chart, lab work & pertinent test results  Airway Mallampati: II  TM Distance: >3 FB Neck ROM: Full    Dental no notable dental hx.    Pulmonary asthma , former smoker   Pulmonary exam normal        Cardiovascular negative cardio ROS Normal cardiovascular exam     Neuro/Psych  PSYCHIATRIC DISORDERS Anxiety Depression    negative neurological ROS     GI/Hepatic negative GI ROS, Neg liver ROS,,,  Endo/Other  diabetes  Patient on GLP-1 Agonist  Renal/GU negative Renal ROS     Musculoskeletal negative musculoskeletal ROS (+)    Abdominal  (+) + obese  Peds  Hematology negative hematology ROS (+)   Anesthesia Other Findings status post breast reconstruction  Reproductive/Obstetrics                             Anesthesia Physical Anesthesia Plan  ASA: 2  Anesthesia Plan: General   Post-op Pain Management:    Induction: Intravenous  PONV Risk Score and Plan: 3 and Ondansetron, Midazolam, Dexamethasone and Treatment may vary due to age or medical condition  Airway Management Planned: LMA  Additional Equipment:   Intra-op Plan:   Post-operative Plan: Extubation in OR  Informed Consent: I have reviewed the patients History and Physical, chart, labs and discussed the procedure including the risks, benefits and alternatives for the proposed anesthesia with the patient or authorized representative who has indicated his/her understanding and acceptance.     Dental advisory given  Plan Discussed with: CRNA  Anesthesia Plan Comments:        Anesthesia Quick Evaluation

## 2023-02-24 NOTE — Anesthesia Procedure Notes (Signed)
Procedure Name: LMA Insertion Date/Time: 02/24/2023 10:40 AM  Performed by: Pearson Grippe, CRNAPre-anesthesia Checklist: Patient identified, Emergency Drugs available, Suction available and Patient being monitored Patient Re-evaluated:Patient Re-evaluated prior to induction Oxygen Delivery Method: Circle system utilized Preoxygenation: Pre-oxygenation with 100% oxygen Induction Type: IV induction Ventilation: Mask ventilation without difficulty LMA: LMA inserted LMA Size: 4.0 Number of attempts: 1 Airway Equipment and Method: Bite block Placement Confirmation: positive ETCO2 Tube secured with: Tape Dental Injury: Teeth and Oropharynx as per pre-operative assessment

## 2023-02-24 NOTE — Op Note (Signed)
Op report Bilateral Exchange   DATE OF OPERATION: 02/24/2023  LOCATION: [facility]  SURGICAL DIVISION: Plastic Surgery  PREOPERATIVE DIAGNOSIS:  1.History of left breast cancer.  2. Acquired absence of bilateral breast.   POSTOPERATIVE DIAGNOSIS:  1. History of left breast cancer.  2. Acquired absence of bilateral breast.   PROCEDURE:  1. Bilateral exchange of tissue expanders for implants.  2. Bilateral capsulotomies for implant respositioning.  SURGEON: Stephan Nelis Sanger Maat Kafer, DO  ASSISTANT: Evelena Leyden, PA  ANESTHESIA:  General.   COMPLICATIONS: None.   IMPLANTS: Left - Mentor Smooth Round Ultra High Profile Gel 590 cc.  Right - Mentor Smooth Round Ultra High Profile Gel 590 cc.   INDICATIONS FOR PROCEDURE:  The patient, Jennifer Davis, is a 47 y.o. female born on 1975/11/16, is here for treatment after bilateral mastectomies.  She had tissue expanders placed at the time of mastectomies. She now presents for exchange of her expanders for implants.  She requires capsulotomies to better position the implants. MRN: [id number]  CONSENT:  Informed consent was obtained directly from the patient. Risks, benefits and alternatives were fully discussed. Specific risks including but not limited to bleeding, infection, hematoma, seroma, scarring, pain, implant infection, implant extrusion, capsular contracture, asymmetry, wound healing problems, and need for further surgery were all discussed. The patient did have an ample opportunity to have her questions answered to her satisfaction.   DESCRIPTION OF PROCEDURE:  The patient was taken to the operating room. SCDs were placed and IV antibiotics were given. The patient's chest was prepped and draped in a sterile fashion. A time out was performed and the implants to be used were identified.    Left breast: The incision was made with the #15 blade at the inframammary fold laterally.  The mastectomy flaps from the  superior and inferior flaps were raised over the ADM for several centimeters to minimize tension for the closure. The ADM was split inferior to the skin incision to expose and remove the tissue expander.  Inspection of the pocket showed a normal healthy capsule and good integration of the biologic matrix.  Inferior capsulotomies were performed to allow for breast pocket expansion.  Measurements were made and a sizer utilized to confirm adequate pocket size for the implant dimensions.  Hemostasis was ensured with the electrocautery.  New gloves were applied. The implant was soaked in antibiotic solution and placed in the pocket and oriented appropriately. The capsule and ADM on the anterior surface were closed with a 3-0 PDS suture. The remaining skin was closed with 3-0 Monocryl deep dermal and 4-0 Monocryl subcuticular stitches.  Dermabond was applied to the incision site. A breast binder and ABDs were placed.  The patient was allowed to wake from anesthesia and taken to the recovery room in satisfactory condition.   Right breast: Local with epinephrine was used to infiltrate at the incision site. The old mastectomy scar was incised.  The mastectomy flaps from the superior and inferior flaps were raised over the ADM for several centimeters to minimize tension for the closure. The ADM was split inferior to the skin incision to expose and remove the tissue expander.  Inspection of the pocket showed a normal healthy capsule and good integration of the biologic matrix.  The pocket was irrigated with antibiotic solution. Superior and medial capsulotomies were performed to allow for breast pocket expansion.  Measurements were made and a sizer used to confirm adequate pocket size for the implant dimensions.  Hemostasis was ensured  with electrocautery. New gloves were placed. The implant was soaked in antibiotic solution and then placed in the pocket and oriented appropriately. The keller funnel was used for both implant  placements. The ADM and capsule on the anterior surface were closed with a 3-0 PDS suture. The remaining skin was closed with 3-0 Monocryl deep dermal and 4-0 Monocryl subcuticular stitches.   The advanced practice practitioner (APP) assisted throughout the case.  The APP was essential in retraction and counter traction when needed to make the case progress smoothly.  This retraction and assistance made it possible to see the tissue plans for the procedure.  The assistance was needed for blood control, tissue re-approximation and assisted with closure of the incision site.

## 2023-02-24 NOTE — Interval H&P Note (Signed)
History and Physical Interval Note:  02/24/2023 10:19 AM  Jennifer Davis  has presented today for surgery, with the diagnosis of status post breast reconstruction.  The various methods of treatment have been discussed with the patient and family. After consideration of risks, benefits and other options for treatment, the patient has consented to  Procedure(s): REMOVAL OF TISSUE EXPANDER AND PLACEMENT OF IMPLANT (Bilateral) as a surgical intervention.  The patient's history has been reviewed, patient examined, no change in status, stable for surgery.  I have reviewed the patient's chart and labs.  Questions were answered to the patient's satisfaction.     Alena Bills Koda Routon

## 2023-02-24 NOTE — Anesthesia Postprocedure Evaluation (Signed)
Anesthesia Post Note  Patient: Jennifer Davis  Procedure(s) Performed: REMOVAL OF TISSUE EXPANDER AND PLACEMENT OF IMPLANT (Bilateral: Breast)     Patient location during evaluation: PACU Anesthesia Type: General Level of consciousness: awake Pain management: pain level controlled Vital Signs Assessment: post-procedure vital signs reviewed and stable Respiratory status: spontaneous breathing, nonlabored ventilation and respiratory function stable Cardiovascular status: blood pressure returned to baseline and stable Postop Assessment: no apparent nausea or vomiting Anesthetic complications: no   No notable events documented.  Last Vitals:  Vitals:   02/24/23 1245 02/24/23 1312  BP: 124/82 136/80  Pulse: 85 88  Resp: 15 18  Temp:  (!) 36.2 C  SpO2: 96% 96%    Last Pain:  Vitals:   02/24/23 1312  TempSrc:   PainSc: 2                  Jennifer Davis Jennifer Davis

## 2023-02-24 NOTE — Transfer of Care (Signed)
Immediate Anesthesia Transfer of Care Note  Patient: Jennifer Davis  Procedure(s) Performed: REMOVAL OF TISSUE EXPANDER AND PLACEMENT OF IMPLANT (Bilateral: Breast)  Patient Location: PACU  Anesthesia Type:General  Level of Consciousness: awake, alert , and oriented  Airway & Oxygen Therapy: Patient Spontanous Breathing and Patient connected to face mask oxygen  Post-op Assessment: Report given to RN and Post -op Vital signs reviewed and stable  Post vital signs: Reviewed and stable  Last Vitals:  Vitals Value Taken Time  BP 132/66 02/24/23 1201  Temp    Pulse 94 02/24/23 1202  Resp 23 02/24/23 1203  SpO2 100 % 02/24/23 1202  Vitals shown include unvalidated device data.  Last Pain:  Vitals:   02/24/23 1000  TempSrc: Oral  PainSc: 0-No pain      Patients Stated Pain Goal: 6 (02/24/23 1000)  Complications: No notable events documented.

## 2023-02-25 ENCOUNTER — Encounter (HOSPITAL_BASED_OUTPATIENT_CLINIC_OR_DEPARTMENT_OTHER): Payer: Self-pay | Admitting: Plastic Surgery

## 2023-02-25 NOTE — Progress Notes (Signed)
Patient is a pleasant 47 year old female with PMH of left-sided breast cancer s/p removal of bilateral tissue expanders and placement of bilateral breast implants performed 02/24/2023 Dr. Ulice Bold who joins via telephone for postoperative day 2 checkup.  Reviewed operative report and 590 cc smooth round ultrahigh profile gel implants were placed at time of surgery.  Today, patient is doing well.  She states that she has been going for walks with her husband.  Her pain has been well-controlled with Tylenol and Advil.  She did endorse some fleeting discomfort on the left side this morning, but less than a minute in duration.  Suspect neuropathic.  She denies any lower extremity swelling, chest pain, difficulty breathing, fevers, or other symptoms.  She is tolerating p.o. intake without difficulty and voiding.  No BM yet, but denies any nausea or abdominal pains.  She will call the clinic should she have any questions or concerns over the weekend, but otherwise we will plan to follow-up for appointment as scheduled next week.  The patient was at home and this provider was calling from their office.  A total of 10 minutes was spent speaking with the patient and reviewing chart.

## 2023-02-26 ENCOUNTER — Ambulatory Visit (INDEPENDENT_AMBULATORY_CARE_PROVIDER_SITE_OTHER): Payer: Commercial Managed Care - PPO | Admitting: Physician Assistant

## 2023-02-26 DIAGNOSIS — F4312 Post-traumatic stress disorder, chronic: Secondary | ICD-10-CM | POA: Diagnosis not present

## 2023-02-26 DIAGNOSIS — Z9889 Other specified postprocedural states: Secondary | ICD-10-CM

## 2023-03-01 ENCOUNTER — Encounter: Payer: Self-pay | Admitting: Hematology and Oncology

## 2023-03-01 DIAGNOSIS — F4312 Post-traumatic stress disorder, chronic: Secondary | ICD-10-CM | POA: Diagnosis not present

## 2023-03-02 ENCOUNTER — Ambulatory Visit (INDEPENDENT_AMBULATORY_CARE_PROVIDER_SITE_OTHER): Payer: Commercial Managed Care - PPO | Admitting: Plastic Surgery

## 2023-03-02 DIAGNOSIS — Z170 Estrogen receptor positive status [ER+]: Secondary | ICD-10-CM

## 2023-03-02 DIAGNOSIS — C50412 Malignant neoplasm of upper-outer quadrant of left female breast: Secondary | ICD-10-CM

## 2023-03-02 DIAGNOSIS — Z9013 Acquired absence of bilateral breasts and nipples: Secondary | ICD-10-CM

## 2023-03-02 NOTE — Progress Notes (Signed)
The patient is a 47 year old female here for follow-up after undergoing removal of expanders and placement of bilateral breast implants last week.  We were able to get in 590 cc ultrahigh profile Mentor implants.  She is looking really good.  There is no sign of redness or infection.  The incisions are healing nicely.  I removed the bandages but kept the Steri-Strips in place.  The patient should wait at least 3 months prior to doing a tattoo since she wants to do a pretty wide and extensive 1.  We will plan to see her back in about 2 weeks.  Pictures were obtained of the patient and placed in the chart with the patient's or guardian's permission.

## 2023-03-03 ENCOUNTER — Other Ambulatory Visit (HOSPITAL_COMMUNITY): Payer: Self-pay

## 2023-03-03 MED ORDER — FLUCONAZOLE 150 MG PO TABS
ORAL_TABLET | ORAL | 0 refills | Status: DC
  Filled 2023-03-03: qty 2, 3d supply, fill #0

## 2023-03-04 ENCOUNTER — Other Ambulatory Visit (HOSPITAL_COMMUNITY): Payer: Self-pay

## 2023-03-04 DIAGNOSIS — F4312 Post-traumatic stress disorder, chronic: Secondary | ICD-10-CM | POA: Diagnosis not present

## 2023-03-04 MED ORDER — ESTRADIOL 0.1 MG/GM VA CREA
0.5000 g | TOPICAL_CREAM | VAGINAL | 6 refills | Status: DC
  Filled 2023-03-04: qty 42.5, 90d supply, fill #0
  Filled 2023-05-28: qty 42.5, 90d supply, fill #1
  Filled 2023-08-11 – 2023-08-12 (×2): qty 42.5, 90d supply, fill #2

## 2023-03-05 ENCOUNTER — Encounter: Payer: Commercial Managed Care - PPO | Admitting: Plastic Surgery

## 2023-03-08 DIAGNOSIS — F4312 Post-traumatic stress disorder, chronic: Secondary | ICD-10-CM | POA: Diagnosis not present

## 2023-03-10 DIAGNOSIS — Z853 Personal history of malignant neoplasm of breast: Secondary | ICD-10-CM | POA: Diagnosis not present

## 2023-03-10 DIAGNOSIS — Z6831 Body mass index (BMI) 31.0-31.9, adult: Secondary | ICD-10-CM | POA: Diagnosis not present

## 2023-03-10 DIAGNOSIS — R632 Polyphagia: Secondary | ICD-10-CM | POA: Diagnosis not present

## 2023-03-10 DIAGNOSIS — J4540 Moderate persistent asthma, uncomplicated: Secondary | ICD-10-CM | POA: Diagnosis not present

## 2023-03-10 DIAGNOSIS — F418 Other specified anxiety disorders: Secondary | ICD-10-CM | POA: Diagnosis not present

## 2023-03-10 DIAGNOSIS — E669 Obesity, unspecified: Secondary | ICD-10-CM | POA: Diagnosis not present

## 2023-03-11 DIAGNOSIS — F4312 Post-traumatic stress disorder, chronic: Secondary | ICD-10-CM | POA: Diagnosis not present

## 2023-03-11 DIAGNOSIS — F4323 Adjustment disorder with mixed anxiety and depressed mood: Secondary | ICD-10-CM | POA: Diagnosis not present

## 2023-03-11 DIAGNOSIS — R102 Pelvic and perineal pain: Secondary | ICD-10-CM | POA: Diagnosis not present

## 2023-03-15 ENCOUNTER — Ambulatory Visit (INDEPENDENT_AMBULATORY_CARE_PROVIDER_SITE_OTHER): Payer: Commercial Managed Care - PPO | Admitting: Physician Assistant

## 2023-03-15 ENCOUNTER — Encounter: Payer: Self-pay | Admitting: Physician Assistant

## 2023-03-15 DIAGNOSIS — R4584 Anhedonia: Secondary | ICD-10-CM | POA: Diagnosis not present

## 2023-03-15 DIAGNOSIS — Z630 Problems in relationship with spouse or partner: Secondary | ICD-10-CM

## 2023-03-15 DIAGNOSIS — F4323 Adjustment disorder with mixed anxiety and depressed mood: Secondary | ICD-10-CM

## 2023-03-15 NOTE — Progress Notes (Signed)
[facility] Check  Patient ID: Jennifer Davis,  MRN: [id number]  PCP: Georgann Housekeeper, MD  Date of Evaluation: 03/15/2023 Time spent:25 minutes   Chief Complaint:  Chief Complaint   Depression; Establish Care   Virtual Visit via Telehealth  I connected with patient by telephone, with their informed consent, and verified patient privacy and that I am speaking with the correct person using two identifiers.  I am private, in my office and the patient is at home.  I discussed the limitations, risks, security and privacy concerns of performing an evaluation and management service by video and the availability of in person appointments. I also discussed with the patient that there may be a patient responsible charge related to this service. The patient expressed understanding and agreed to proceed.   I discussed the assessment and treatment plan with the patient. The patient was provided an opportunity to ask questions and all were answered. The patient agreed with the plan and demonstrated an understanding of the instructions.   The patient was advised to call back or seek an in-person evaluation if the symptoms worsen or if the condition fails to improve as anticipated.  I provided 25 minutes of non-face-to-face time during this encounter.  HISTORY/CURRENT STATUS: HPI For routine med check.   We changed Lexapro to Cymbalta since LOV.  It is more effective than the Lexapro.  It is hard to say how much better she is though because she and her husband are having marital issues, just started counseling, so the way she feels is circumstantial as well.  She is not sleeping good, her mind races a lot.  Melatonin does help though.  She is not enjoying much of anything right now and energy and motivation are fair, depending on what is going on each day.  Work is going well.  No extreme sadness, tearfulness, or feelings of hopelessness.  ADLs and personal hygiene are normal.   Denies any changes in  concentration, making decisions, or remembering things.  Appetite has not changed.  Weight is stable.  Not having panic attacks.  Does have situational anxiety but is something she is working through with her therapist.  Denies suicidal or homicidal thoughts.  States she started back smoking.  Some days she does not smoke at all and other days she might smoke up to 4 cigarettes.  It depends on her stress level.  She had quit for a while but her husband smokes and that has made it difficult for her, especially that she is so stressed right now.  Patient denies increased energy with decreased need for sleep, increased talkativeness, racing thoughts, impulsivity or risky behaviors, increased spending, increased libido, grandiosity, increased irritability or anger, paranoia, or hallucinations.  Denies dizziness, syncope, seizures, numbness, tingling, tremor, tics, unsteady gait, slurred speech, confusion.  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  Denies muscle stiffness or dystonia. Denies unexplained weight loss, frequent infections, or sores that heal slowly.  No polyphagia, polydipsia, or polyuria. Denies visual changes or paresthesias.   Individual Medical History/ Review of Systems: Changes? :No    Past medications for mental health diagnoses include: Lexapro, Prozac wasn't effective, Xanax, Buspar worked but d/c when didn't need it.  Allergies: Bee venom, Dog epithelium (canis lupus familiaris), Dust mite extract, Grass pollen([id number]-swt vern), Other, and Pollen extract  Current Medications:  Current Outpatient Medications:    albuterol (VENTOLIN HFA) 108 (90 Base) MCG/ACT inhaler, Inhale 2 puffs into the lungs  every 4-6 hours as needed for cough/wheeze, Disp: 6.7 g, Rfl: 0   budesonide-formoterol (SYMBICORT) 160-4.5 MCG/ACT inhaler, Inhale 2 puffs into the lungs 2 (two) times daily., Disp: 10.2 g, Rfl: 5   cetirizine (ZYRTEC) 10 MG tablet,  Take 10 mg by mouth daily., Disp: , Rfl:    Cholecalciferol (VITAMIN D) 50 MCG (2000 UT) tablet, Take 1 tablet (2,000 Units total) by mouth daily., Disp: , Rfl:    diazepam (VALIUM) 2 MG tablet, Take 1 tablet (2 mg total) by mouth every 8 (eight) hours as needed for anxiety., Disp: 30 tablet, Rfl: 0   diphenhydrAMINE (BENADRYL) 25 MG tablet, Take 50 mg by mouth every 6 (six) hours as needed (hives)., Disp: , Rfl:    DULoxetine (CYMBALTA) 60 MG capsule, Take 1 capsule (60 mg total) by mouth daily. **Start after taking duloxetine 30 mg daily for 2 weeks., Disp: 30 capsule, Rfl: 1   estradiol (ESTRACE) 0.1 MG/GM vaginal cream, Insert 0.5 grams vaginally 2 (two) times a week as directed, Disp: 42.5 g, Rfl: 6   gabapentin (NEURONTIN) 100 MG capsule, Take 1 capsule (100 mg total) by mouth 3 (three) times daily. (Patient taking differently: Take 100 mg by mouth 3 (three) times daily. prn), Disp: 270 capsule, Rfl: 0   ibuprofen (ADVIL) 200 MG tablet, Take 400 mg by mouth every 6 (six) hours as needed for moderate pain., Disp: , Rfl:    melatonin 5 MG TABS, Take 5 mg by mouth at bedtime as needed (sleep)., Disp: , Rfl:    montelukast (SINGULAIR) 10 MG tablet, Take 1 tablet (10 mg total) by mouth every evening., Disp: 30 tablet, Rfl: 5   Multiple Vitamin (MULTI VITAMIN) TABS, 1 tablet Orally Once a day, Disp: , Rfl:    ondansetron (ZOFRAN) 4 MG tablet, Take 1 tablet (4 mg total) by mouth every 8 (eight) hours as needed for up to 20 doses for nausea or vomiting., Disp: 20 tablet, Rfl: 0   ondansetron (ZOFRAN-ODT) 4 MG disintegrating tablet, Take 1 tablet (4 mg total) by mouth every 8 (eight) hours as needed for nausea or vomiting., Disp: 20 tablet, Rfl: 0   Probiotic Product ([employer] PROBIOTIC PO), Take 1 capsule by mouth daily., Disp: , Rfl:    Semaglutide-Weight Management (WEGOVY) 1.7 MG/0.75ML SOAJ, Inject 1.7 mg as directed once a week., Disp: 3 mL, Rfl: 0   tamoxifen (NOLVADEX) 10 MG tablet, Take 1 tablet (10  mg total) by mouth daily., Disp: 90 tablet, Rfl: 3   EPINEPHrine 0.3 mg/0.3 mL IJ SOAJ injection, Use as directed for anaphylaxis (Patient not taking: Reported on 03/15/2023), Disp: 2 each, Rfl: 1   fluconazole (DIFLUCAN) 150 MG tablet, take 1 tablet by mouth as directed, repeat in 3 days (Patient not taking: Reported on 03/15/2023), Disp: 2 tablet, Rfl: 0   fluticasone (FLONASE) 50 MCG/ACT nasal spray, Place 2 sprays into both nostrils daily. (Patient not taking: Reported on 03/15/2023), Disp: 16 g, Rfl: 0   Semaglutide-Weight Management (WEGOVY) 1 MG/0.5ML SOAJ, Inject 1 mg into the skin once a week. (Patient not taking: Reported on 03/15/2023), Disp: 2 mL, Rfl: 1 Medication Side Effects: none  Family Medical/ Social History: Changes?  Marital probs  MENTAL HEALTH EXAM:  Last menstrual period 12/10/2020.There is no height or weight on file to calculate BMI.  General Appearance:  Unable to assess  Eye Contact:   Unable to assess  Speech:  Clear and Coherent and Normal Rate  Volume:  Normal  Mood:  sad  Affect:   Unable to assess  Thought Process:  Goal Directed and Descriptions of Associations: Circumstantial  Orientation:  Full (Time, Place, and Person)  Thought Content: Logical   Suicidal Thoughts:  No  Homicidal Thoughts:  No  Memory:  WNL  Judgement:  Good  Insight:  Good  Psychomotor Activity:   Unable to assess  Concentration:  Concentration: Good and Attention Span: Good  Recall:  Good  Fund of Knowledge: Good  Language: Good  Assets:  Communication Skills Desire for Improvement Financial Resources/Insurance Housing Transportation Vocational/Educational  ADL's:  Intact  Cognition: WNL  Prognosis:  Good   DIAGNOSES:    ICD-10-CM   1. Situational mixed anxiety and depressive disorder  F43.23     2. Anhedonia  R45.84     3. Marital stress  Z63.0       Receiving Psychotherapy: Yes  [facility] at [facility]  RECOMMENDATIONS:  PDMP reviewed.  Valium filled 09/28/2022  I  provided 25 minutes of non-face-to-face time during this encounter, including time spent before and after the visit in records review, medical decision making, counseling pertinent to today's visit, and charting.   She has only been on the current dose of Cymbalta for about 3-1/2 weeks and it has not had time to get to a maximum efficacy.  I recommend she stay on this dose for another 2 weeks, she can send me a message through my chart to let me know if she is not at least 50% better and I will increase the dose to 90 mg.  She verbalizes understanding.  Sleep hygiene discussed.  Smoking cessation discussed.   Continue Valium 2 mg, 1 p.o. every 8 hours as needed anxiety or muscle spasms.  Rarely takes it.  She has plenty, given by another provider. Continue Cymbalta 60 mg, 1 p.o. daily. Continue gabapentin 100 mg, 1 p.o. 3 times daily as needed. Continue melatonin 5 mg nightly as needed sleep. Continue therapy both individual and marriage counseling. Return in 6 weeks.  Melony Overly, PA-C

## 2023-03-15 NOTE — Progress Notes (Unsigned)
Patient is a pleasant 47 year old female with PMH of left-sided breast cancer s/p removal of bilateral tissue expanders and placement of bilateral breast implants performed 02/24/2023 Dr. Ulice Bold who presents to clinic for postoperative follow-up.  Reviewed operative report and 590 cc smooth round ultrahigh profile gel implants were placed at time of surgery.   She was last seen here in clinic on 03/02/2023.  At that time, exam was entirely benign.  Steri-Strips were left in place.  Discussed waiting 3 months prior to tattooing.  Return in 2 weeks.  Today,

## 2023-03-16 ENCOUNTER — Other Ambulatory Visit: Payer: Self-pay

## 2023-03-16 ENCOUNTER — Ambulatory Visit (INDEPENDENT_AMBULATORY_CARE_PROVIDER_SITE_OTHER): Payer: Commercial Managed Care - PPO | Admitting: Physician Assistant

## 2023-03-16 ENCOUNTER — Encounter: Payer: Self-pay | Admitting: Physician Assistant

## 2023-03-16 ENCOUNTER — Encounter: Payer: Self-pay | Admitting: Emergency Medicine

## 2023-03-16 ENCOUNTER — Emergency Department
Admission: EM | Admit: 2023-03-16 | Discharge: 2023-03-16 | Disposition: A | Discharge status: Home / Self Care | Payer: Commercial Managed Care - PPO | Attending: Emergency Medicine | Admitting: Emergency Medicine

## 2023-03-16 VITALS — BP 146/98 | HR 90 | Ht 59.5 in | Wt 160.4 lb

## 2023-03-16 DIAGNOSIS — T7840XA Allergy, unspecified, initial encounter: Secondary | ICD-10-CM | POA: Diagnosis not present

## 2023-03-16 DIAGNOSIS — Z853 Personal history of malignant neoplasm of breast: Secondary | ICD-10-CM | POA: Insufficient documentation

## 2023-03-16 DIAGNOSIS — Z9889 Other specified postprocedural states: Secondary | ICD-10-CM

## 2023-03-16 DIAGNOSIS — J45909 Unspecified asthma, uncomplicated: Secondary | ICD-10-CM | POA: Insufficient documentation

## 2023-03-16 MED ORDER — METHYLPREDNISOLONE SODIUM SUCC 125 MG IJ SOLR
125.0000 mg | Freq: Once | INTRAMUSCULAR | Status: AC
  Administered 2023-03-16: 125 mg via INTRAVENOUS
  Filled 2023-03-16: qty 2

## 2023-03-16 MED ORDER — PREDNISONE 50 MG PO TABS
50.0000 mg | ORAL_TABLET | Freq: Every day | ORAL | 0 refills | Status: DC
  Filled 2023-03-16: qty 5, 5d supply, fill #0

## 2023-03-16 MED ORDER — FAMOTIDINE IN NACL 20-0.9 MG/50ML-% IV SOLN
20.0000 mg | Freq: Once | INTRAVENOUS | Status: AC
  Administered 2023-03-16: 20 mg via INTRAVENOUS
  Filled 2023-03-16: qty 50

## 2023-03-16 MED ORDER — SODIUM CHLORIDE 0.9 % IV BOLUS
1000.0000 mL | Freq: Once | INTRAVENOUS | Status: AC
  Administered 2023-03-16: 1000 mL via INTRAVENOUS

## 2023-03-16 MED ORDER — DIPHENHYDRAMINE HCL 50 MG/ML IJ SOLN
50.0000 mg | Freq: Once | INTRAMUSCULAR | Status: AC
  Administered 2023-03-16: 50 mg via INTRAVENOUS
  Filled 2023-03-16: qty 1

## 2023-03-16 NOTE — Discharge Instructions (Signed)
You can take 25 mg of Benadryl every eight hours for itching. You can take 40 mg of Pepcid once daily.  You can take 50 mg of Prednisone once daily for the next five days.

## 2023-03-16 NOTE — ED Notes (Signed)
Pt feeling better, finger still swollen.

## 2023-03-16 NOTE — ED Triage Notes (Signed)
Patient to ED via POV for allergic reaction. Patient was taking garbage out and was bit by something on right pinky. Swelling noted and patient states going up arm. Patient took epi pen at 1447. Denies any difficulty breathing. NAD noted. Speaking in full sentences.

## 2023-03-16 NOTE — ED Provider Notes (Signed)
[facility] Provider Note  Patient Contact: 4:28 PM (approximate)   History   Allergic Reaction   HPI  Jennifer Davis is a 47 y.o. 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  Patient states that she was taking out her garbage when she felt a sting.  She denies chest pain, chest tightness, shortness of breath, vomiting, diarrhea or syncope.  Patient states that she gave herself her EpiPen about 2 hours ago but has not taken any other medicines.  She states that she has noticed some slightly worsening swelling along the dorsal aspect of her right hand.      Physical Exam   Triage Vital Signs: ED Triage Vitals  Enc Vitals Group     BP 03/16/23 1516 (!) 156/107     Pulse Rate 03/16/23 1516 93     Resp 03/16/23 1516 18     Temp 03/16/23 1516 98.9 F (37.2 C)     Temp Source 03/16/23 1516 Oral     SpO2 03/16/23 1516 99 %     Weight --      Height --      Head Circumference --      Peak Flow --      Pain Score 03/16/23 1518 5     Pain Loc --      Pain Edu? --      Excl. in GC? --     Most recent vital signs: Vitals:   03/16/23 1630 03/16/23 1908  BP: (!) 142/86 137/80  Pulse: 88 98  Resp: 16 18  Temp:    SpO2: 97% 98%     General: Alert and in no acute distress. Eyes:  PERRL. EOMI. Head: No acute traumatic findings ENT:      Nose: No congestion/rhinnorhea.      Mouth/Throat: Mucous membranes are moist. Neck: No stridor. No cervical spine tenderness to palpation. Cardiovascular:  Good peripheral perfusion Respiratory: Normal respiratory effort without tachypnea or retractions. Lungs CTAB. Good air entry to the bases with no decreased or absent breath sounds. Gastrointestinal: Bowel sounds 4 quadrants. Soft and nontender to palpation. No guarding or rigidity. No palpable masses. No distention. No CVA  tenderness. Musculoskeletal: Full range of motion to all extremities.  Neurologic:  No gross focal neurologic deficits are appreciated.  Skin:   No rash noted    ED Results / Procedures / Treatments   Labs (all labs ordered are listed, but only abnormal results are displayed) Labs Reviewed - No data to display    PROCEDURES:  Critical Care performed: No  Procedures   MEDICATIONS ORDERED IN ED: Medications  methylPREDNISolone sodium succinate (SOLU-MEDROL) 125 mg/2 mL injection 125 mg (125 mg Intravenous Given 03/16/23 1655)  sodium chloride 0.9 % bolus 1,000 mL (0 mLs Intravenous Stopped 03/16/23 1850)  diphenhydrAMINE (BENADRYL) injection 50 mg (50 mg Intravenous Given 03/16/23 1656)  famotidine (PEPCID) IVPB 20 mg premix (0 mg Intravenous Stopped 03/16/23 1850)     IMPRESSION / MDM / ASSESSMENT AND PLAN / ED COURSE  I reviewed the triage vital signs and the nursing notes.                              Assessment and plan: Insect bite/sting:  47 year old female with history of anaphylaxis, presents to the urgency department after she  possibly sustained an insect bite/sting of the right fifth digit.  Vital signs reassuring at triage.  On exam, patient did have some edema of the right fifth digit which extended to her right hand but had no facial swelling.  She was managing her own secretions and had no increased work of breathing.  She had no cough or perceived shortness of breath.  No vomiting, diarrhea or syncope.  She had administered IM epinephrine prior to presenting to the emergency department.  Patient was given IV Solu-Medrol, Pepcid and Benadryl and tolerated these medications well with some improvement in her symptoms.  She does still have some residual swelling of the right fifth digit.  I did start patient on prednisone daily for the next 5 days, Benadryl every 8 hours and Pepcid once daily until her symptoms resolve.     FINAL CLINICAL IMPRESSION(S) / ED DIAGNOSES    Final diagnoses:  Allergic reaction, initial encounter     Rx / DC Orders   ED Discharge Orders          Ordered    predniSONE (DELTASONE) 50 MG tablet        03/16/23 1859             Note:  This document was prepared using Dragon voice recognition software and may include unintentional dictation errors.   Pia Mau [place], PA-C 03/16/23 1917    Minna Antis, MD 03/16/23 2144

## 2023-03-16 NOTE — ED Notes (Signed)
Swelling to pinkie noted. No hives. No SOB. Respirations unlabored.

## 2023-03-17 ENCOUNTER — Other Ambulatory Visit: Payer: Self-pay | Admitting: Physician Assistant

## 2023-03-17 ENCOUNTER — Other Ambulatory Visit (HOSPITAL_COMMUNITY): Payer: Self-pay

## 2023-03-17 ENCOUNTER — Other Ambulatory Visit: Payer: Self-pay

## 2023-03-17 MED ORDER — ALBUTEROL SULFATE HFA 108 (90 BASE) MCG/ACT IN AERS
2.0000 | INHALATION_SPRAY | RESPIRATORY_TRACT | 0 refills | Status: DC | PRN
  Filled 2023-03-17: qty 6.7, 25d supply, fill #0

## 2023-03-18 ENCOUNTER — Other Ambulatory Visit: Payer: Self-pay

## 2023-03-18 MED ORDER — GABAPENTIN 100 MG PO CAPS
100.0000 mg | ORAL_CAPSULE | Freq: Three times a day (TID) | ORAL | 0 refills | Status: DC
  Filled 2023-03-18: qty 270, 90d supply, fill #0

## 2023-03-19 ENCOUNTER — Other Ambulatory Visit: Payer: Self-pay

## 2023-03-19 ENCOUNTER — Other Ambulatory Visit (HOSPITAL_COMMUNITY): Payer: Self-pay

## 2023-03-22 ENCOUNTER — Other Ambulatory Visit (HOSPITAL_COMMUNITY): Payer: Self-pay

## 2023-03-22 DIAGNOSIS — F4312 Post-traumatic stress disorder, chronic: Secondary | ICD-10-CM | POA: Diagnosis not present

## 2023-03-22 DIAGNOSIS — R0989 Other specified symptoms and signs involving the circulatory and respiratory systems: Secondary | ICD-10-CM | POA: Diagnosis not present

## 2023-03-22 DIAGNOSIS — F411 Generalized anxiety disorder: Secondary | ICD-10-CM | POA: Diagnosis not present

## 2023-03-22 MED ORDER — BUSPIRONE HCL 5 MG PO TABS
5.0000 mg | ORAL_TABLET | Freq: Two times a day (BID) | ORAL | 5 refills | Status: DC
  Filled 2023-03-22: qty 60, 30d supply, fill #0
  Filled 2023-04-17 – 2023-04-21 (×2): qty 60, 30d supply, fill #1
  Filled 2023-05-17: qty 60, 30d supply, fill #2

## 2023-03-25 DIAGNOSIS — F4323 Adjustment disorder with mixed anxiety and depressed mood: Secondary | ICD-10-CM | POA: Diagnosis not present

## 2023-03-26 DIAGNOSIS — F4312 Post-traumatic stress disorder, chronic: Secondary | ICD-10-CM | POA: Diagnosis not present

## 2023-03-29 ENCOUNTER — Other Ambulatory Visit (HOSPITAL_COMMUNITY): Payer: Self-pay

## 2023-03-29 MED ORDER — DULOXETINE HCL 30 MG PO CPEP
90.0000 mg | ORAL_CAPSULE | Freq: Every day | ORAL | 1 refills | Status: DC
  Filled 2023-03-29: qty 90, 30d supply, fill #0
  Filled 2023-04-24: qty 90, 30d supply, fill #1

## 2023-03-29 NOTE — Progress Notes (Unsigned)
Patient is a pleasant 47 year old female with PMH of left-sided breast cancer s/p removal of bilateral tissue expanders and placement of bilateral breast implants performed 02/24/2023 Dr. Ulice Bold who presents to clinic for postoperative follow-up.  She was last seen here in clinic on 03/16/2023.  At that time, she was doing well from a postoperative standpoint.  There was a bit of a ridge on the inferior aspect of left breast, but understands that there was a considerable amount of scarring noted intraoperatively not unexpected given history of radiation on that side.  Overall, she was pleased.  Exam was reassuring.  Plan for photos at subsequent encounter.    Today,

## 2023-03-30 ENCOUNTER — Ambulatory Visit (INDEPENDENT_AMBULATORY_CARE_PROVIDER_SITE_OTHER): Payer: Commercial Managed Care - PPO | Admitting: Physician Assistant

## 2023-03-30 DIAGNOSIS — Z9889 Other specified postprocedural states: Secondary | ICD-10-CM

## 2023-03-30 DIAGNOSIS — F4312 Post-traumatic stress disorder, chronic: Secondary | ICD-10-CM | POA: Diagnosis not present

## 2023-04-01 DIAGNOSIS — F4312 Post-traumatic stress disorder, chronic: Secondary | ICD-10-CM | POA: Diagnosis not present

## 2023-04-04 ENCOUNTER — Other Ambulatory Visit (HOSPITAL_COMMUNITY): Payer: Self-pay

## 2023-04-05 DIAGNOSIS — F4312 Post-traumatic stress disorder, chronic: Secondary | ICD-10-CM | POA: Diagnosis not present

## 2023-04-06 DIAGNOSIS — F4323 Adjustment disorder with mixed anxiety and depressed mood: Secondary | ICD-10-CM | POA: Diagnosis not present

## 2023-04-07 ENCOUNTER — Other Ambulatory Visit (HOSPITAL_COMMUNITY): Payer: Self-pay

## 2023-04-07 DIAGNOSIS — F418 Other specified anxiety disorders: Secondary | ICD-10-CM | POA: Diagnosis not present

## 2023-04-07 DIAGNOSIS — E669 Obesity, unspecified: Secondary | ICD-10-CM | POA: Diagnosis not present

## 2023-04-07 DIAGNOSIS — Z6830 Body mass index (BMI) 30.0-30.9, adult: Secondary | ICD-10-CM | POA: Diagnosis not present

## 2023-04-07 DIAGNOSIS — R632 Polyphagia: Secondary | ICD-10-CM | POA: Diagnosis not present

## 2023-04-07 MED ORDER — WEGOVY 2.4 MG/0.75ML ~~LOC~~ SOAJ
2.4000 mg | SUBCUTANEOUS | 0 refills | Status: DC
  Filled 2023-04-07 – 2023-04-13 (×2): qty 3, 28d supply, fill #0

## 2023-04-13 ENCOUNTER — Other Ambulatory Visit (HOSPITAL_COMMUNITY): Payer: Self-pay

## 2023-04-16 DIAGNOSIS — F4312 Post-traumatic stress disorder, chronic: Secondary | ICD-10-CM | POA: Diagnosis not present

## 2023-04-17 ENCOUNTER — Other Ambulatory Visit (HOSPITAL_COMMUNITY): Payer: Self-pay

## 2023-04-19 ENCOUNTER — Other Ambulatory Visit: Payer: Self-pay

## 2023-04-19 ENCOUNTER — Other Ambulatory Visit (HOSPITAL_COMMUNITY): Payer: Self-pay

## 2023-04-19 MED ORDER — MOUNJARO 5 MG/0.5ML ~~LOC~~ SOAJ
5.0000 mg | SUBCUTANEOUS | 0 refills | Status: DC
  Filled 2023-04-19: qty 2, 28d supply, fill #0
  Filled 2023-05-17: qty 2, 28d supply, fill #1
  Filled 2023-07-04: qty 2, 28d supply, fill #2

## 2023-04-19 MED ORDER — MONTELUKAST SODIUM 10 MG PO TABS
10.0000 mg | ORAL_TABLET | Freq: Every day | ORAL | 3 refills | Status: DC
  Filled 2023-04-19 – 2023-04-21 (×2): qty 30, 30d supply, fill #0
  Filled 2023-05-17: qty 30, 30d supply, fill #1
  Filled 2023-07-19: qty 30, 30d supply, fill #2
  Filled 2023-08-20: qty 30, 30d supply, fill #3

## 2023-04-21 ENCOUNTER — Other Ambulatory Visit (HOSPITAL_COMMUNITY): Payer: Self-pay

## 2023-04-21 DIAGNOSIS — F4312 Post-traumatic stress disorder, chronic: Secondary | ICD-10-CM | POA: Diagnosis not present

## 2023-04-22 ENCOUNTER — Other Ambulatory Visit (HOSPITAL_COMMUNITY): Payer: Self-pay

## 2023-04-26 ENCOUNTER — Encounter: Payer: Self-pay | Admitting: Physician Assistant

## 2023-04-26 ENCOUNTER — Other Ambulatory Visit (HOSPITAL_COMMUNITY): Payer: Self-pay

## 2023-04-26 ENCOUNTER — Ambulatory Visit (INDEPENDENT_AMBULATORY_CARE_PROVIDER_SITE_OTHER): Payer: Commercial Managed Care - PPO | Admitting: Physician Assistant

## 2023-04-26 DIAGNOSIS — C50412 Malignant neoplasm of upper-outer quadrant of left female breast: Secondary | ICD-10-CM

## 2023-04-26 DIAGNOSIS — Z630 Problems in relationship with spouse or partner: Secondary | ICD-10-CM | POA: Diagnosis not present

## 2023-04-26 DIAGNOSIS — Z170 Estrogen receptor positive status [ER+]: Secondary | ICD-10-CM | POA: Diagnosis not present

## 2023-04-26 DIAGNOSIS — F4323 Adjustment disorder with mixed anxiety and depressed mood: Secondary | ICD-10-CM

## 2023-04-26 DIAGNOSIS — F4312 Post-traumatic stress disorder, chronic: Secondary | ICD-10-CM | POA: Diagnosis not present

## 2023-04-26 MED ORDER — DULOXETINE HCL 30 MG PO CPEP
90.0000 mg | ORAL_CAPSULE | Freq: Every day | ORAL | 3 refills | Status: DC
  Filled 2023-05-04: qty 270, 90d supply, fill #0

## 2023-04-26 NOTE — Progress Notes (Signed)
[facility] Med Check  Patient ID: Jennifer Davis,  MRN: [id number]  PCP: Jennifer Housekeeper, MD  Date of Evaluation: 04/26/2023 Time spent:20 minutes   Chief Complaint:  Chief Complaint   Depression; Follow-up    HISTORY/CURRENT STATUS: HPI For routine med check.   Has been put on Buspar per PCP. Low dose, it is helping but makes her hungry, she's taking it as needed d/t that. It's effective this way.   Patient is able to enjoy things.  Energy and motivation are good.  Work is going well.   No extreme sadness, tearfulness, or feelings of hopelessness. She does cry at times when thinking about the marital problems, but it's better than she used to be.  Sleeps well most of the time. Does take the melatonin sometimes. ADLs and personal hygiene are normal.   Denies any changes in concentration, making decisions, or remembering things.  Appetite has not changed.  Weight is stable.  Denies suicidal or homicidal thoughts.  Patient denies increased energy with decreased need for sleep, increased talkativeness, racing thoughts, impulsivity or risky behaviors, increased spending, increased libido, grandiosity, increased irritability or anger, paranoia, or hallucinations.  Denies dizziness, syncope, seizures, numbness, tingling, tremor, tics, unsteady gait, slurred speech, confusion.  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  Denies muscle stiffness or dystonia. Denies unexplained weight loss, frequent infections, or sores that heal slowly.  No polyphagia, polydipsia, or polyuria. Denies visual changes or paresthesias.   Individual Medical History/ Review of Systems: Changes? :No    Past medications for mental health diagnoses include: Lexapro, Prozac wasn't effective, Xanax, Buspar worked but d/c when didn't need it.  Allergies: Bee venom, Dog epithelium (canis lupus familiaris), Dust mite extract, Grass pollen(k-o-r-t-swt vern), Other, and Pollen  extract  Current Medications:  Current Outpatient Medications:    albuterol (VENTOLIN HFA) 108 (90 Base) MCG/ACT inhaler, Inhale 2 puffs into the lungs every 4 to 6 hours as needed., Disp: 6.7 g, Rfl: 0   budesonide-formoterol (SYMBICORT) 160-4.5 MCG/ACT inhaler, Inhale 2 puffs into the lungs 2 (two) times daily., Disp: 10.2 g, Rfl: 5   busPIRone (BUSPAR) 5 MG tablet, Take 1 tablet (5 mg total) by mouth 2 (two) times daily. (Patient taking differently: Take 5 mg by mouth 2 (two) times daily. prn), Disp: 60 tablet, Rfl: 5   cetirizine (ZYRTEC) 10 MG tablet, Take 10 mg by mouth daily., Disp: , Rfl:    Cholecalciferol (VITAMIN D) 50 MCG (2000 UT) tablet, Take 1 tablet (2,000 Units total) by mouth daily., Disp: , Rfl:    diphenhydrAMINE (BENADRYL) 25 MG tablet, Take 50 mg by mouth every 6 (six) hours as needed (hives)., Disp: , Rfl:    estradiol (ESTRACE) 0.1 MG/GM vaginal cream, Insert 0.5 grams vaginally 2 (two) times a week as directed, Disp: 42.5 g, Rfl: 6   gabapentin (NEURONTIN) 100 MG capsule, Take 1 capsule (100 mg total) by mouth 3 (three) times daily., Disp: 270 capsule, Rfl: 0   ibuprofen (ADVIL) 200 MG tablet, Take 400 mg by mouth every 6 (six) hours as needed for moderate pain., Disp: , Rfl:    melatonin 5 MG TABS, Take 5 mg by mouth at bedtime as needed (sleep)., Disp: , Rfl:    montelukast (SINGULAIR) 10 MG tablet, Take 1 tablet (10 mg total) by mouth daily., Disp: 30 tablet, Rfl: 3   Multiple Vitamin (MULTI VITAMIN) TABS, 1 tablet Orally Once a day, Disp: , Rfl:    Probiotic  Product ([employer] PROBIOTIC PO), Take 1 capsule by mouth daily., Disp: , Rfl:    tamoxifen (NOLVADEX) 10 MG tablet, Take 1 tablet (10 mg total) by mouth daily., Disp: 90 tablet, Rfl: 3   tirzepatide (MOUNJARO) 5 MG/0.5ML Pen, Inject 5 mg into the skin once a week., Disp: 6 mL, Rfl: 0   DULoxetine (CYMBALTA) 30 MG capsule, Take 3 capsules (90 mg total) by mouth daily., Disp: 270 capsule, Rfl: 3   EPINEPHrine 0.3 mg/0.3  mL IJ SOAJ injection, Use as directed for anaphylaxis (Patient not taking: Reported on 03/30/2023), Disp: 2 each, Rfl: 1   fluticasone (FLONASE) 50 MCG/ACT nasal spray, Place 2 sprays into both nostrils daily. (Patient not taking: Reported on 04/26/2023), Disp: 16 g, Rfl: 0 Medication Side Effects: none  Family Medical/ Social History: Changes? She and husband are now separated. He moved out although they are trying to work things out. In marriage counseling.   MENTAL HEALTH EXAM:  Last menstrual period 12/10/2020.There is no height or weight on file to calculate BMI.  General Appearance: Casual and Well Groomed  Eye Contact:  Good  Speech:  Clear and Coherent and Normal Rate  Volume:  Normal  Mood:  Euthymic  Affect:  Congruent  Thought Process:  Goal Directed and Descriptions of Associations: Circumstantial  Orientation:  Full (Time, Place, and Person)  Thought Content: Logical   Suicidal Thoughts:  No  Homicidal Thoughts:  No  Memory:  WNL  Judgement:  Good  Insight:  Good  Psychomotor Activity:  Normal  Concentration:  Concentration: Good and Attention Span: Good  Recall:  Good  Fund of Knowledge: Good  Language: Good  Assets:  Communication Skills Desire for Improvement Financial Resources/Insurance Housing Resilience Transportation Vocational/Educational  ADL's:  Intact  Cognition: WNL  Prognosis:  Good   DIAGNOSES:    ICD-10-CM   1. Situational mixed anxiety and depressive disorder  F43.23     2. Marital stress  Z63.0     3. Malignant neoplasm of upper-outer quadrant of left breast in female, estrogen receptor positive (HCC)  C50.412    Z17.0      Receiving Psychotherapy: Yes  Jennifer Davis at [facility]  RECOMMENDATIONS:  PDMP reviewed.  Gabapentin filled 03/19/2023. I provided 20 minutes of face to face time during this encounter, including time spent before and after the visit in records review, medical decision making, counseling pertinent to today's visit, and  charting.   Smoking cessation discussed. She only smokes 1-3 cigs per day. Not ready to make any changes at this time.   Continue Buspar 5 mg, 1 po bid. (Per PCP) Continue Cymbalta  90 mg/d.  Continue gabapentin 100 mg, 1 p.o. 3 times daily as needed. Continue melatonin 5 mg nightly as needed sleep. Continue therapy both individual and marriage counseling. Return in 3 months.   Melony Overly, PA-C

## 2023-04-27 ENCOUNTER — Ambulatory Visit: Payer: Commercial Managed Care - PPO | Admitting: Internal Medicine

## 2023-04-27 ENCOUNTER — Other Ambulatory Visit (HOSPITAL_COMMUNITY): Payer: Self-pay

## 2023-04-28 DIAGNOSIS — F4323 Adjustment disorder with mixed anxiety and depressed mood: Secondary | ICD-10-CM | POA: Diagnosis not present

## 2023-04-30 ENCOUNTER — Other Ambulatory Visit (HOSPITAL_COMMUNITY): Payer: Self-pay

## 2023-05-03 ENCOUNTER — Other Ambulatory Visit (HOSPITAL_COMMUNITY): Payer: Self-pay

## 2023-05-03 DIAGNOSIS — R102 Pelvic and perineal pain: Secondary | ICD-10-CM | POA: Diagnosis not present

## 2023-05-03 DIAGNOSIS — F4312 Post-traumatic stress disorder, chronic: Secondary | ICD-10-CM | POA: Diagnosis not present

## 2023-05-03 DIAGNOSIS — Z853 Personal history of malignant neoplasm of breast: Secondary | ICD-10-CM | POA: Diagnosis not present

## 2023-05-03 DIAGNOSIS — N839 Noninflammatory disorder of ovary, fallopian tube and broad ligament, unspecified: Secondary | ICD-10-CM | POA: Diagnosis not present

## 2023-05-03 MED ORDER — FLUCONAZOLE 150 MG PO TABS
150.0000 mg | ORAL_TABLET | ORAL | 0 refills | Status: DC
  Filled 2023-05-03: qty 2, 3d supply, fill #0

## 2023-05-04 ENCOUNTER — Other Ambulatory Visit (HOSPITAL_COMMUNITY): Payer: Self-pay

## 2023-05-04 MED ORDER — ALBUTEROL SULFATE HFA 108 (90 BASE) MCG/ACT IN AERS
2.0000 | INHALATION_SPRAY | RESPIRATORY_TRACT | 0 refills | Status: DC
  Filled 2023-05-04: qty 6.7, 25d supply, fill #0

## 2023-05-05 DIAGNOSIS — F418 Other specified anxiety disorders: Secondary | ICD-10-CM | POA: Diagnosis not present

## 2023-05-05 DIAGNOSIS — F4323 Adjustment disorder with mixed anxiety and depressed mood: Secondary | ICD-10-CM | POA: Diagnosis not present

## 2023-05-05 DIAGNOSIS — Z6829 Body mass index (BMI) 29.0-29.9, adult: Secondary | ICD-10-CM | POA: Diagnosis not present

## 2023-05-05 DIAGNOSIS — E663 Overweight: Secondary | ICD-10-CM | POA: Diagnosis not present

## 2023-05-05 DIAGNOSIS — K5903 Drug induced constipation: Secondary | ICD-10-CM | POA: Diagnosis not present

## 2023-05-05 DIAGNOSIS — Z8639 Personal history of other endocrine, nutritional and metabolic disease: Secondary | ICD-10-CM | POA: Diagnosis not present

## 2023-05-05 DIAGNOSIS — R632 Polyphagia: Secondary | ICD-10-CM | POA: Diagnosis not present

## 2023-05-18 ENCOUNTER — Ambulatory Visit: Payer: Commercial Managed Care - PPO | Admitting: Physician Assistant

## 2023-05-18 DIAGNOSIS — F4312 Post-traumatic stress disorder, chronic: Secondary | ICD-10-CM | POA: Diagnosis not present

## 2023-05-20 DIAGNOSIS — F4323 Adjustment disorder with mixed anxiety and depressed mood: Secondary | ICD-10-CM | POA: Diagnosis not present

## 2023-05-22 NOTE — Progress Notes (Signed)
Patient Care Team: Georgann Housekeeper, MD as PCP - General (Internal Medicine) Abigail Miyamoto, MD as Consulting Physician (General Surgery) Serena Croissant, MD as Consulting Physician (Hematology and Oncology) Antony Blackbird, MD as Consulting Physician (Radiation Oncology)  DIAGNOSIS:  Encounter Diagnosis  Name Primary?   Malignant neoplasm of upper-outer quadrant of left breast in female, estrogen receptor positive (HCC) Yes    SUMMARY OF ONCOLOGIC HISTORY: Oncology History  Malignant neoplasm of upper-outer quadrant of left breast in female, estrogen receptor positive (HCC)  09/19/2020 Initial Diagnosis   Mammogram in 07/2018 showed probably benign bilateral breast masses that were stable on her last mammogram on 07/26/19. Mammogram on 08/28/20 showed the stable bilateral masses, a new 0.9cm mass at the 1 o'clock position in the left breast, and one abnormal left axillary lymph node with 0.6cm cortical thickening. 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    10/02/2020 Genetic Testing   Negative genetic testing: no pathogenic variants detected in [facility] Common-Hereditary Cancers Panel.  Variants of uncertain significance detected in APC c.6918T>A (p.Asp2306Glu) and POLE  c.2612G>C (p.Ser871Thr).  The report date is November 24. 2021.   The Common Hereditary Cancers Panel offered by [facility] includes sequencing and/or deletion duplication testing of the following 48 genes: APC, ATM, AXIN2, BARD1, BMPR1A, BRCA1, BRCA2, BRIP1, CDH1, CDK4, CDKN2A (p14ARF), CDKN2A (p16INK4a), CHEK2, CTNNA1, DICER1, EPCAM (Deletion/duplication testing only), GREM1 (promoter region deletion/duplication testing only), KIT, MEN1, MLH1, MSH2, MSH3, MSH6, MUTYH, NBN, NF1, NHTL1, PALB2, PDGFRA, PMS2, POLD1, POLE, PTEN, RAD50, RAD51C, RAD51D, RNF43, SDHB, SDHC, SDHD, SMAD4, SMARCA4. STK11, TP53, TSC1, TSC2, and VHL.  The  following genes were evaluated for sequence changes only: SDHA and HOXB13 c.251G>A variant only.   12/04/2020 Surgery   Bilateral mastectomies with reconstruction Magnus Ivan):  Right breast: no malignancy  Left breast: invasive and in situ ductal carcinoma, 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    12/04/2020 Cancer Staging   Staging form: Breast, AJCC 8th Edition - Pathologic stage from 12/04/2020: Stage IA (pT1c, pN1a, cM0, G2, ER+, PR+, HER2-) - Signed by Loa Socks, NP on 12/18/2020 Histologic grading system: 3 grade system   12/04/2020 Miscellaneous   MammaPrint: High risk: Luminal type B, 5-year metastasis free survival with chemo and hormone therapy: 93%, absolute benefit from chemo greater than 12%, average 10-year risk of recurrence untreated: 29%   01/22/2021 - 06/19/2021 Chemotherapy   Patient is on Treatment Plan : BREAST ADJUVANT DOSE DENSE AC q14d / PACLitaxel q7d     07/09/2021 - 08/22/2021 Radiation Therapy   07/09/2021 through 08/22/2021 Site Technique Total Dose (Gy) Dose per Fx (Gy) Completed Fx Beam Energies  Chest Wall, Left: CW_Lt 3D 50/50 2 25/25 10X, 15X  Sclav-LT: SCV_Lt 3D 50/50 2 25/25 10X, 15X  Chest Wall, Left: CW_Lt_Bst Electron 10/10 2 5/5 6E     09/2021 -  Anti-estrogen oral therapy   Tamoxifen daily     CHIEF COMPLIANT: Follow-up of left breast cancer on Tamoxifen   INTERVAL HISTORY: Jennifer Davis is a 47 y.o. with above-mentioned history of left breast cancer. Currently on Tamoxifen. She presents to the clinic today for a follow-up. Pt reports that she feels good. She has loss  a significant amount of weight. Denies any pain or discomfort in breast. Tolerating tamoxifen extremely well. She does have mild hot flashes. Using estradiol cream for the vaginal dryness  and tries to walk at least 6000 steps. States that she will start back walking after the weather change.   ALLERGIES:  is allergic to bee  venom, dog epithelium (canis lupus familiaris), dust mite extract, grass pollen(k-o-r-t-swt vern), other, and pollen extract.  MEDICATIONS:  Current Outpatient Medications  Medication Sig Dispense Refill   albuterol (VENTOLIN HFA) 108 (90 Base) MCG/ACT inhaler Inhale 2 puffs into the lungs every 4-6 hours as needed 6.7 g 0   budesonide-formoterol (SYMBICORT) 160-4.5 MCG/ACT inhaler Inhale 2 puffs into the lungs 2 (two) times daily. 10.2 g 5   busPIRone (BUSPAR) 5 MG tablet Take 1 tablet (5 mg total) by mouth 2 (two) times daily. (Patient taking differently: Take 5 mg by mouth 2 (two) times daily. prn) 60 tablet 5   cetirizine (ZYRTEC) 10 MG tablet Take 10 mg by mouth daily.     Cholecalciferol (VITAMIN D) 50 MCG (2000 UT) tablet Take 1 tablet (2,000 Units total) by mouth daily.     diphenhydrAMINE (BENADRYL) 25 MG tablet Take 50 mg by mouth every 6 (six) hours as needed (hives).     DULoxetine (CYMBALTA) 30 MG capsule Take 3 capsules (90 mg total) by mouth daily. 270 capsule 3   estradiol (ESTRACE) 0.1 MG/GM vaginal cream Insert 0.5 grams vaginally 2 (two) times a week as directed 42.5 g 6   fluconazole (DIFLUCAN) 150 MG tablet Take 1 tablet (150 mg total) by mouth as directed, may repeat 1 tablet in 3 days 2 tablet 0   fluticasone (FLONASE) 50 MCG/ACT nasal spray Place 2 sprays into both nostrils daily. 16 g 0   gabapentin (NEURONTIN) 100 MG capsule Take 1 capsule (100 mg total) by mouth 3 (three) times daily. 270 capsule 0   ibuprofen (ADVIL) 200 MG tablet Take 400 mg by mouth every 6 (six) hours as needed for moderate pain.     melatonin 5 MG TABS Take 5 mg by mouth at bedtime as needed (sleep).     montelukast (SINGULAIR) 10 MG tablet Take 1 tablet (10 mg total) by mouth daily. 30 tablet 3   Multiple Vitamin (MULTI VITAMIN) TABS 1 tablet Orally Once a day     Probiotic Product ([employer]S PROBIOTIC PO) Take 1 capsule by mouth daily.     tamoxifen (NOLVADEX) 10 MG tablet Take 1 tablet (10 mg total)  by mouth daily. 90 tablet 3   tirzepatide (MOUNJARO) 5 MG/0.5ML Pen Inject 5 mg into the skin once a week. 6 mL 0   EPINEPHrine 0.3 mg/0.3 mL IJ SOAJ injection Use as directed for anaphylaxis (Patient not taking: Reported on 03/30/2023) 2 each 1   No current facility-administered medications for this visit.    PHYSICAL EXAMINATION: ECOG PERFORMANCE STATUS: 1 - Symptomatic but completely ambulatory  Vitals:   05/24/23 1356  BP: 130/84  Pulse: 80  Resp: 18  Temp: (!) 97.5 F (36.4 C)  SpO2: 95%   Filed Weights   05/24/23 1356  Weight: 157 lb 8 oz (71.4 kg)     LABORATORY DATA:  I have reviewed the data as listed    Latest Ref Rng & Units 02/18/2023    3:40 PM 10/09/2022   10:22 AM 09/22/2022    8:54 AM  CMP  Glucose 70 - 99 mg/dL 161  99  97   BUN 6 - 20 mg/dL 10  9  11    Creatinine 0.44 - 1.00 mg/dL 0.96  0.45  4.09   Sodium 135 - 145 mmol/L 138  142  140   Potassium 3.5 - 5.1 mmol/L 4.1  3.9  4.0   Chloride 98 - 111 mmol/L 105  110  106   CO2 22 - 32 mmol/L 25  25  24    Calcium 8.9 - 10.3 mg/dL 8.9  8.2  9.6   Total Protein 6.5 - 8.1 g/dL  6.6    Total Bilirubin 0.3 - 1.2 mg/dL  0.4    Alkaline Phos 38 - 126 U/L  137    AST 15 - 41 U/L  63    ALT 0 - 44 U/L  104      Lab Results  Component Value Date   WBC 8.8 10/09/2022   HGB 12.5 10/09/2022   HCT 38.0 10/09/2022   MCV 86.4 10/09/2022   PLT 432 (H) 10/09/2022   NEUTROABS 4.8 12/03/2021    ASSESSMENT & PLAN:  Malignant neoplasm of upper-outer quadrant of left breast in female, estrogen receptor positive (HCC) 09/20/2019: Screening mammogram on 08/28/20 showed the stable bilateral masses, a new 0.9cm mass at the 1 o'clock position in the left breast, and one abnormal left axillary lymph node with 0.6cm cortical thickening. 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  MammaPrint: High risk: Luminal type B, 5-year  metastasis free survival with chemo and hormone therapy: 93%, absolute benefit from chemo greater than 12%, average 10-year risk of recurrence untreated: 29%   Treatment plan: 1.  :Bilateral mastectomies with reconstruction Magnus Ivan): Right mastectomy: no malignancy Left mastectomy: invasive and in situ ductal carcinoma, 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  2.  adjuvant chemotherapy with dose dense Adriamycin and Cytoxan followed by Taxol completed 06/19/2021 3.  Adjuvant radiation 07/09/2021-08/22/2021 4.  Followed by adjuvant antiestrogen therapy with tamoxifen started 09/09/2021 ------------------------------------------------------------------------------------------------------------- Current treatment: Antiestrogen therapy with tamoxifen daily started 09/09/2021 dose reduced 05/21/2022 to 10 mg Tamoxifen toxicities: Hot flashes Otherwise tolerating the 10 mg dose extremely well.   Breast cancer surveillance: mammogram: No role of mammograms since she had right-sided mastectomy and left breast reconstructed. 02/24/2023: Breast implant placement   Patient 60 months on Mounjaro and controlling her diet. She has a new sense of optimism.  She and her husband were separated previously are now starting to get back together. She has lots of plans to explore and do adventurous things.  I discussed with her about [employer] minimal residual disease testing and she will think about it and will inform us. return to clinic in 1 year for follow-up    No orders of the defined types were placed in this encounter.  The patient has a good understanding of the overall plan. she agrees with it. she will call with any problems that may develop before the next visit here. Total time spent: 30 mins including face to face time and time spent for planning, charting and co-ordination of care   Tamsen Meek, MD 05/24/23    I Janan Ridge am acting as a  Neurosurgeon for [employer]  I have reviewed the above documentation for accuracy and completeness, and I agree with the above.

## 2023-05-23 ENCOUNTER — Other Ambulatory Visit (HOSPITAL_COMMUNITY): Payer: Self-pay

## 2023-05-24 ENCOUNTER — Other Ambulatory Visit (HOSPITAL_COMMUNITY): Payer: Self-pay

## 2023-05-24 ENCOUNTER — Other Ambulatory Visit: Payer: Self-pay

## 2023-05-24 ENCOUNTER — Inpatient Hospital Stay: Payer: Commercial Managed Care - PPO | Attending: Hematology and Oncology | Admitting: Hematology and Oncology

## 2023-05-24 VITALS — BP 130/84 | HR 80 | Temp 97.5°F | Resp 18 | Ht 59.5 in | Wt 157.5 lb

## 2023-05-24 DIAGNOSIS — R232 Flushing: Secondary | ICD-10-CM | POA: Insufficient documentation

## 2023-05-24 DIAGNOSIS — Z923 Personal history of irradiation: Secondary | ICD-10-CM | POA: Diagnosis not present

## 2023-05-24 DIAGNOSIS — Z9221 Personal history of antineoplastic chemotherapy: Secondary | ICD-10-CM | POA: Diagnosis not present

## 2023-05-24 DIAGNOSIS — Z170 Estrogen receptor positive status [ER+]: Secondary | ICD-10-CM | POA: Insufficient documentation

## 2023-05-24 DIAGNOSIS — Z9013 Acquired absence of bilateral breasts and nipples: Secondary | ICD-10-CM | POA: Insufficient documentation

## 2023-05-24 DIAGNOSIS — C50412 Malignant neoplasm of upper-outer quadrant of left female breast: Secondary | ICD-10-CM | POA: Insufficient documentation

## 2023-05-24 NOTE — Assessment & Plan Note (Addendum)
09/20/2019: Screening mammogram on 08/28/20 showed the stable bilateral masses, a new 0.9cm mass at the 1 o'clock position in the left breast, and one abnormal left axillary lymph node with 0.6cm cortical thickening. 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  MammaPrint: High risk: Luminal type B, 5-year metastasis free survival with chemo and hormone therapy: 93%, absolute benefit from chemo greater than 12%, average 10-year risk of recurrence untreated: 29%   Treatment plan: 1.  :Bilateral mastectomies with reconstruction Magnus Ivan): Right mastectomy: no malignancy Left mastectomy: invasive and in situ ductal carcinoma, 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  2.  adjuvant chemotherapy with dose dense Adriamycin and Cytoxan followed by Taxol completed 06/19/2021 3.  Adjuvant radiation 07/09/2021-08/22/2021 4.  Followed by adjuvant antiestrogen therapy with tamoxifen started 09/09/2021 ------------------------------------------------------------------------------------------------------------- Current treatment: Antiestrogen therapy with tamoxifen daily started 09/09/2021 dose reduced 05/21/2022 to 10 mg Tamoxifen toxicities: 1.  Diffuse body aches and pains 2. overall feeling of fatigue   Breast cancer surveillance: 1.  Breast exam 05/24/2023: Benign 2. mammogram: No role of mammograms since she had right-sided mastectomy and left breast reconstructed. 02/24/2023: Breast implant placement   return to clinic in 1 year for follow-up

## 2023-05-25 ENCOUNTER — Telehealth: Payer: Self-pay | Admitting: Hematology and Oncology

## 2023-05-25 NOTE — Telephone Encounter (Signed)
Scheduled appointment per 7/15 los. Unable to talk with patient due to call not going through at this time.

## 2023-05-26 DIAGNOSIS — F4323 Adjustment disorder with mixed anxiety and depressed mood: Secondary | ICD-10-CM | POA: Diagnosis not present

## 2023-05-28 ENCOUNTER — Other Ambulatory Visit: Payer: Self-pay

## 2023-05-28 ENCOUNTER — Other Ambulatory Visit (HOSPITAL_COMMUNITY): Payer: Self-pay

## 2023-06-01 DIAGNOSIS — Z6829 Body mass index (BMI) 29.0-29.9, adult: Secondary | ICD-10-CM | POA: Diagnosis not present

## 2023-06-01 DIAGNOSIS — Z8639 Personal history of other endocrine, nutritional and metabolic disease: Secondary | ICD-10-CM | POA: Diagnosis not present

## 2023-06-01 DIAGNOSIS — E663 Overweight: Secondary | ICD-10-CM | POA: Diagnosis not present

## 2023-06-01 DIAGNOSIS — R632 Polyphagia: Secondary | ICD-10-CM | POA: Diagnosis not present

## 2023-06-01 DIAGNOSIS — F418 Other specified anxiety disorders: Secondary | ICD-10-CM | POA: Diagnosis not present

## 2023-06-01 DIAGNOSIS — E118 Type 2 diabetes mellitus with unspecified complications: Secondary | ICD-10-CM | POA: Diagnosis not present

## 2023-06-01 DIAGNOSIS — F4312 Post-traumatic stress disorder, chronic: Secondary | ICD-10-CM | POA: Diagnosis not present

## 2023-06-05 ENCOUNTER — Telehealth: Payer: Commercial Managed Care - PPO | Admitting: Physician Assistant

## 2023-06-05 DIAGNOSIS — R051 Acute cough: Secondary | ICD-10-CM | POA: Diagnosis not present

## 2023-06-05 MED ORDER — PREDNISONE 20 MG PO TABS: 40.0000 mg | ORAL_TABLET | Freq: Every day | ORAL | 0 refills | Status: DC

## 2023-06-05 MED ORDER — BENZONATATE 100 MG PO CAPS: 100.0000 mg | ORAL_CAPSULE | Freq: Three times a day (TID) | ORAL | 0 refills | Status: DC | PRN

## 2023-06-05 NOTE — Progress Notes (Signed)
E-Visit for Cough  We are sorry that you are not feeling well.  Here is how we plan to help!  Based on your presentation I believe you most likely have A cough due to a virus.  This is called viral bronchitis and is best treated by rest, plenty of fluids and control of the cough.  You may use Ibuprofen or Tylenol as directed to help your symptoms.     In addition you may use A non-prescription cough medication called Robitussin DAC. Take 2 teaspoons every 8 hours or Delsym: take 2 teaspoons every 12 hours., A non-prescription cough medication called Mucinex DM: take 2 tablets every 12 hours., and A prescription cough medication called Tessalon Perles 100mg . You may take 1-2 capsules every 8 hours as needed for your cough.  I have also sent in a short course of prednisone.   From your responses in the eVisit questionnaire you describe inflammation in the upper respiratory tract which is causing a significant cough.  This is commonly called Bronchitis and has four common causes:   Allergies Viral Infections Acid Reflux Bacterial Infection Allergies, viruses and acid reflux are treated by controlling symptoms or eliminating the cause. An example might be a cough caused by taking certain blood pressure medications. You stop the cough by changing the medication. Another example might be a cough caused by acid reflux. Controlling the reflux helps control the cough.  USE OF BRONCHODILATOR ("RESCUE") INHALERS: There is a risk from using your bronchodilator too frequently.  The risk is that over-reliance on a medication which only relaxes the muscles surrounding the breathing tubes can reduce the effectiveness of medications prescribed to reduce swelling and congestion of the tubes themselves.  Although you feel brief relief from the bronchodilator inhaler, your asthma may actually be worsening with the tubes becoming more swollen and filled with mucus.  This can delay other crucial treatments, such as oral  steroid medications. If you need to use a bronchodilator inhaler daily, several times per day, you should discuss this with your provider.  There are probably better treatments that could be used to keep your asthma under control.     HOME CARE Only take medications as instructed by your medical team. Complete the entire course of an antibiotic. Drink plenty of fluids and get plenty of rest. Avoid close contacts especially the very young and the elderly Cover your mouth if you cough or cough into your sleeve. Always remember to wash your hands A steam or ultrasonic humidifier can help congestion.   GET HELP RIGHT AWAY IF: You develop worsening fever. You become short of breath You cough up blood. Your symptoms persist after you have completed your treatment plan MAKE SURE YOU  Understand these instructions. Will watch your condition. Will get help right away if you are not doing well or get worse.    Thank you for choosing an e-visit.  Your e-visit answers were reviewed by a board certified advanced clinical practitioner to complete your personal care plan. Depending upon the condition, your plan could have included both over the counter or prescription medications.  Please review your pharmacy choice. Make sure the pharmacy is open so you can pick up prescription now. If there is a problem, you may contact your provider through [employer] and have the prescription routed to another pharmacy.  Your safety is important to [place]. If you have drug allergies check your prescription carefully.   For the next 24 hours you can use MyChart to  ask questions about today's visit, request a non-urgent call back, or ask for a work or school excuse. You will get an email in the next two days asking about your experience. I hope that your e-visit has been valuable and will speed your recovery.   I have spent 5 minutes in review of e-visit questionnaire, review and updating patient chart, medical  decision making and response to patient.   Tylene Fantasia Ward, PA-C

## 2023-06-07 DIAGNOSIS — F4312 Post-traumatic stress disorder, chronic: Secondary | ICD-10-CM | POA: Diagnosis not present

## 2023-06-09 ENCOUNTER — Inpatient Hospital Stay
Admission: RE | Admit: 2023-06-09 | Discharge: 2023-06-14 | DRG: 885 | Disposition: A | Discharge status: Home / Self Care | Payer: Commercial Managed Care - PPO | Source: Intra-hospital | Attending: Psychiatry | Admitting: Psychiatry

## 2023-06-09 ENCOUNTER — Ambulatory Visit (HOSPITAL_COMMUNITY)
Admission: EM | Admit: 2023-06-09 | Discharge: 2023-06-09 | Disposition: A | Discharge status: Other Acute Inpatient Hospital | Payer: Commercial Managed Care - PPO | Attending: Registered Nurse | Admitting: Registered Nurse

## 2023-06-09 ENCOUNTER — Encounter (HOSPITAL_COMMUNITY): Payer: Self-pay | Admitting: Registered Nurse

## 2023-06-09 ENCOUNTER — Encounter: Payer: Self-pay | Admitting: Registered Nurse

## 2023-06-09 ENCOUNTER — Other Ambulatory Visit: Payer: Self-pay

## 2023-06-09 DIAGNOSIS — Z818 Family history of other mental and behavioral disorders: Secondary | ICD-10-CM

## 2023-06-09 DIAGNOSIS — R45851 Suicidal ideations: Secondary | ICD-10-CM

## 2023-06-09 DIAGNOSIS — F411 Generalized anxiety disorder: Secondary | ICD-10-CM | POA: Diagnosis present

## 2023-06-09 DIAGNOSIS — F332 Major depressive disorder, recurrent severe without psychotic features: Secondary | ICD-10-CM | POA: Diagnosis not present

## 2023-06-09 DIAGNOSIS — R000 Tachycardia, unspecified: Secondary | ICD-10-CM | POA: Insufficient documentation

## 2023-06-09 DIAGNOSIS — Z8249 Family history of ischemic heart disease and other diseases of the circulatory system: Secondary | ICD-10-CM

## 2023-06-09 DIAGNOSIS — Z923 Personal history of irradiation: Secondary | ICD-10-CM | POA: Diagnosis not present

## 2023-06-09 DIAGNOSIS — Z7951 Long term (current) use of inhaled steroids: Secondary | ICD-10-CM

## 2023-06-09 DIAGNOSIS — Z87891 Personal history of nicotine dependence: Secondary | ICD-10-CM | POA: Diagnosis not present

## 2023-06-09 DIAGNOSIS — Z62810 Personal history of physical and sexual abuse in childhood: Secondary | ICD-10-CM | POA: Diagnosis not present

## 2023-06-09 DIAGNOSIS — E119 Type 2 diabetes mellitus without complications: Secondary | ICD-10-CM | POA: Diagnosis not present

## 2023-06-09 DIAGNOSIS — F4323 Adjustment disorder with mixed anxiety and depressed mood: Secondary | ICD-10-CM | POA: Diagnosis not present

## 2023-06-09 DIAGNOSIS — Z9151 Personal history of suicidal behavior: Secondary | ICD-10-CM

## 2023-06-09 DIAGNOSIS — J45909 Unspecified asthma, uncomplicated: Secondary | ICD-10-CM | POA: Diagnosis present

## 2023-06-09 DIAGNOSIS — F4310 Post-traumatic stress disorder, unspecified: Secondary | ICD-10-CM | POA: Diagnosis present

## 2023-06-09 DIAGNOSIS — Z853 Personal history of malignant neoplasm of breast: Secondary | ICD-10-CM

## 2023-06-09 LAB — URINALYSIS, ROUTINE W REFLEX MICROSCOPIC
Bilirubin Urine: NEGATIVE
Glucose, UA: NEGATIVE mg/dL
Hgb urine dipstick: NEGATIVE
Ketones, ur: 80 mg/dL — AB
Leukocytes,Ua: NEGATIVE
Nitrite: NEGATIVE
Protein, ur: 30 mg/dL — AB
Specific Gravity, Urine: 1.021 (ref 1.005–1.030)
pH: 6 (ref 5.0–8.0)

## 2023-06-09 LAB — POCT URINE DRUG SCREEN - MANUAL ENTRY (I-SCREEN)
POC Amphetamine UR: NOT DETECTED
POC Buprenorphine (BUP): NOT DETECTED
POC Cocaine UR: NOT DETECTED
POC Marijuana UR: NOT DETECTED
POC Methadone UR: NOT DETECTED
POC Methamphetamine UR: NOT DETECTED
POC Morphine: NOT DETECTED
POC Oxazepam (BZO): NOT DETECTED
POC Oxycodone UR: NOT DETECTED
POC Secobarbital (BAR): NOT DETECTED

## 2023-06-09 LAB — LIPID PANEL
Cholesterol: 240 mg/dL — ABNORMAL HIGH (ref 0–200)
HDL: 75 mg/dL (ref >40)
LDL Cholesterol: 141 mg/dL — ABNORMAL HIGH (ref 0–99)
Total CHOL/HDL Ratio: 3.2 RATIO
Triglycerides: 119 mg/dL (ref <150)
VLDL: 24 mg/dL (ref 0–40)

## 2023-06-09 LAB — CBC WITH DIFFERENTIAL/PLATELET
Abs Immature Granulocytes: 0.05 10*3/uL (ref 0.00–0.07)
Basophils Absolute: 0.1 10*3/uL (ref 0.0–0.1)
Basophils Relative: 1 %
Eosinophils Absolute: 0.2 10*3/uL (ref 0.0–0.5)
Eosinophils Relative: 2 %
HCT: 42 % (ref 36.0–46.0)
Hemoglobin: 13.9 g/dL (ref 12.0–15.0)
Immature Granulocytes: 1 %
Lymphocytes Relative: 36 %
Lymphs Abs: 3.8 10*3/uL (ref 0.7–4.0)
MCH: 29.5 pg (ref 26.0–34.0)
MCHC: 33.1 g/dL (ref 30.0–36.0)
MCV: 89.2 fL (ref 80.0–100.0)
Monocytes Absolute: 0.7 10*3/uL (ref 0.1–1.0)
Monocytes Relative: 6 %
Neutro Abs: 5.7 10*3/uL (ref 1.7–7.7)
Neutrophils Relative %: 54 %
Platelets: 374 10*3/uL (ref 150–400)
RBC: 4.71 MIL/uL (ref 3.87–5.11)
RDW: 12.7 % (ref 11.5–15.5)
WBC: 10.4 10*3/uL (ref 4.0–10.5)
nRBC: 0 % (ref 0.0–0.2)

## 2023-06-09 LAB — COMPREHENSIVE METABOLIC PANEL
ALT: 37 U/L (ref 0–44)
AST: 37 U/L (ref 15–41)
Albumin: 4 g/dL (ref 3.5–5.0)
Alkaline Phosphatase: 56 U/L (ref 38–126)
Anion gap: 13 (ref 5–15)
BUN: 10 mg/dL (ref 6–20)
CO2: 27 mmol/L (ref 22–32)
Calcium: 9.5 mg/dL (ref 8.9–10.3)
Chloride: 98 mmol/L (ref 98–111)
Creatinine, Ser: 0.85 mg/dL (ref 0.44–1.00)
GFR, Estimated: >60 mL/min (ref >60)
Glucose, Bld: 121 mg/dL — ABNORMAL HIGH (ref 70–99)
Potassium: 3.2 mmol/L — ABNORMAL LOW (ref 3.5–5.1)
Sodium: 138 mmol/L (ref 135–145)
Total Bilirubin: 0.3 mg/dL (ref 0.3–1.2)
Total Protein: 7.2 g/dL (ref 6.5–8.1)

## 2023-06-09 LAB — MAGNESIUM: Magnesium: 2.3 mg/dL (ref 1.7–2.4)

## 2023-06-09 LAB — TSH: TSH: 1.33 u[IU]/mL (ref 0.350–4.500)

## 2023-06-09 LAB — ETHANOL: Alcohol, Ethyl (B): <10 mg/dL (ref <10)

## 2023-06-09 LAB — HEMOGLOBIN A1C
Hgb A1c MFr Bld: 5.8 % — ABNORMAL HIGH (ref 4.8–5.6)
Mean Plasma Glucose: 119.76 mg/dL

## 2023-06-09 LAB — POC URINE PREG, ED: Preg Test, Ur: NEGATIVE

## 2023-06-09 LAB — POCT PREGNANCY, URINE: Preg Test, Ur: NEGATIVE

## 2023-06-09 MED ORDER — ADULT MULTIVITAMIN W/MINERALS CH: 1.0000 | ORAL_TABLET | Freq: Every day | ORAL | Status: DC

## 2023-06-09 MED ORDER — MAGNESIUM HYDROXIDE 400 MG/5ML PO SUSP
30.0000 mL | Freq: Every day | ORAL | Status: DC | PRN
  Administered 2023-06-11: 30 mL via ORAL
  Filled 2023-06-09: qty 30

## 2023-06-09 MED ORDER — ACETAMINOPHEN 325 MG PO TABS
650.0000 mg | ORAL_TABLET | Freq: Four times a day (QID) | ORAL | Status: DC | PRN
  Administered 2023-06-12: 650 mg via ORAL
  Filled 2023-06-09: qty 2

## 2023-06-09 MED ORDER — ALBUTEROL SULFATE HFA 108 (90 BASE) MCG/ACT IN AERS: 2.0000 | INHALATION_SPRAY | RESPIRATORY_TRACT | Status: DC | PRN

## 2023-06-09 MED ORDER — TAMOXIFEN CITRATE 10 MG PO TABS
10.0000 mg | ORAL_TABLET | Freq: Every day | ORAL | Status: DC
  Administered 2023-06-10 – 2023-06-14 (×5): 10 mg via ORAL
  Filled 2023-06-09 (×5): qty 1

## 2023-06-09 MED ORDER — POTASSIUM CHLORIDE CRYS ER 20 MEQ PO TBCR
40.0000 meq | EXTENDED_RELEASE_TABLET | Freq: Once | ORAL | Status: AC
  Administered 2023-06-09: 40 meq via ORAL
  Filled 2023-06-09: qty 2

## 2023-06-09 MED ORDER — ALUM & MAG HYDROXIDE-SIMETH 200-200-20 MG/5ML PO SUSP: 30.0000 mL | ORAL | Status: DC | PRN

## 2023-06-09 MED ORDER — BUSPIRONE HCL 5 MG PO TABS
5.0000 mg | ORAL_TABLET | Freq: Two times a day (BID) | ORAL | Status: DC
  Administered 2023-06-09: 5 mg via ORAL
  Filled 2023-06-09: qty 1

## 2023-06-09 MED ORDER — GABAPENTIN 100 MG PO CAPS
100.0000 mg | ORAL_CAPSULE | Freq: Three times a day (TID) | ORAL | Status: DC
  Administered 2023-06-09: 100 mg via ORAL
  Filled 2023-06-09: qty 1

## 2023-06-09 MED ORDER — LORATADINE 10 MG PO TABS
10.0000 mg | ORAL_TABLET | Freq: Every day | ORAL | Status: DC
  Administered 2023-06-09: 10 mg via ORAL
  Filled 2023-06-09: qty 1

## 2023-06-09 MED ORDER — DULOXETINE HCL 30 MG PO CPEP
30.0000 mg | ORAL_CAPSULE | Freq: Every day | ORAL | Status: DC
  Administered 2023-06-09: 30 mg via ORAL
  Filled 2023-06-09: qty 1

## 2023-06-09 MED ORDER — MAGNESIUM HYDROXIDE 400 MG/5ML PO SUSP: 30.0000 mL | Freq: Every day | ORAL | Status: DC | PRN

## 2023-06-09 MED ORDER — DULOXETINE HCL 60 MG PO CPEP
60.0000 mg | ORAL_CAPSULE | Freq: Every day | ORAL | Status: DC
  Administered 2023-06-09: 60 mg via ORAL
  Filled 2023-06-09: qty 3

## 2023-06-09 MED ORDER — GABAPENTIN 100 MG PO CAPS
100.0000 mg | ORAL_CAPSULE | Freq: Three times a day (TID) | ORAL | Status: DC
  Administered 2023-06-10 – 2023-06-14 (×13): 100 mg via ORAL
  Filled 2023-06-09 (×13): qty 1

## 2023-06-09 MED ORDER — MOMETASONE FURO-FORMOTEROL FUM 200-5 MCG/ACT IN AERO
2.0000 | INHALATION_SPRAY | Freq: Two times a day (BID) | RESPIRATORY_TRACT | Status: DC
  Administered 2023-06-09: 2 via RESPIRATORY_TRACT
  Filled 2023-06-09: qty 8.8

## 2023-06-09 MED ORDER — DIPHENHYDRAMINE HCL 25 MG PO CAPS
50.0000 mg | ORAL_CAPSULE | Freq: Three times a day (TID) | ORAL | Status: DC | PRN
  Administered 2023-06-12: 50 mg via ORAL
  Filled 2023-06-09: qty 2

## 2023-06-09 MED ORDER — TIRZEPATIDE 5 MG/0.5ML ~~LOC~~ SOAJ: 5.0000 mg | SUBCUTANEOUS | Status: DC

## 2023-06-09 MED ORDER — PREDNISONE 20 MG PO TABS
40.0000 mg | ORAL_TABLET | Freq: Every day | ORAL | Status: DC
  Administered 2023-06-09: 40 mg via ORAL
  Filled 2023-06-09: qty 2

## 2023-06-09 MED ORDER — PREDNISONE 10 MG PO TABS
40.0000 mg | ORAL_TABLET | Freq: Every day | ORAL | Status: AC
  Administered 2023-06-10 – 2023-06-12 (×3): 40 mg via ORAL
  Filled 2023-06-09 (×3): qty 4

## 2023-06-09 MED ORDER — MOMETASONE FURO-FORMOTEROL FUM 200-5 MCG/ACT IN AERO
2.0000 | INHALATION_SPRAY | Freq: Two times a day (BID) | RESPIRATORY_TRACT | Status: DC
  Administered 2023-06-10 – 2023-06-14 (×9): 2 via RESPIRATORY_TRACT
  Filled 2023-06-09: qty 8.8

## 2023-06-09 MED ORDER — DIPHENHYDRAMINE HCL 50 MG/ML IJ SOLN: 50.0000 mg | Freq: Three times a day (TID) | INTRAMUSCULAR | Status: DC | PRN

## 2023-06-09 MED ORDER — ACETAMINOPHEN 325 MG PO TABS: 650.0000 mg | ORAL_TABLET | Freq: Four times a day (QID) | ORAL | Status: DC | PRN

## 2023-06-09 MED ORDER — EPINEPHRINE 0.3 MG/0.3ML IJ SOAJ: 0.3000 mg | Freq: Once | INTRAMUSCULAR | Status: DC | PRN

## 2023-06-09 MED ORDER — DULOXETINE HCL 60 MG PO CPEP
90.0000 mg | ORAL_CAPSULE | Freq: Every day | ORAL | Status: DC
  Filled 2023-06-09: qty 3

## 2023-06-09 MED ORDER — LORATADINE 10 MG PO TABS
10.0000 mg | ORAL_TABLET | Freq: Every day | ORAL | Status: DC
  Administered 2023-06-10 – 2023-06-14 (×5): 10 mg via ORAL
  Filled 2023-06-09 (×5): qty 1

## 2023-06-09 MED ORDER — ADULT MULTIVITAMIN W/MINERALS CH
1.0000 | ORAL_TABLET | Freq: Every day | ORAL | Status: DC
  Administered 2023-06-10 – 2023-06-14 (×5): 1 via ORAL
  Filled 2023-06-09 (×5): qty 1

## 2023-06-09 MED ORDER — TAMOXIFEN CITRATE 10 MG PO TABS
10.0000 mg | ORAL_TABLET | Freq: Every day | ORAL | Status: DC
  Administered 2023-06-09: 10 mg via ORAL
  Filled 2023-06-09: qty 1

## 2023-06-09 MED ORDER — VITAMIN D 25 MCG (1000 UNIT) PO TABS
2000.0000 [IU] | ORAL_TABLET | Freq: Every day | ORAL | Status: DC
  Administered 2023-06-10 – 2023-06-14 (×5): 2000 [IU] via ORAL
  Filled 2023-06-09 (×6): qty 2

## 2023-06-09 MED ORDER — VITAMIN D 25 MCG (1000 UNIT) PO TABS
2000.0000 [IU] | ORAL_TABLET | Freq: Every day | ORAL | Status: DC
  Administered 2023-06-09: 2000 [IU] via ORAL
  Filled 2023-06-09: qty 2

## 2023-06-09 MED ORDER — GABAPENTIN 100 MG PO CAPS: 100.0000 mg | ORAL_CAPSULE | Freq: Three times a day (TID) | ORAL | Status: DC

## 2023-06-09 MED ORDER — MONTELUKAST SODIUM 10 MG PO TABS
10.0000 mg | ORAL_TABLET | Freq: Every day | ORAL | Status: DC
  Administered 2023-06-09: 10 mg via ORAL
  Filled 2023-06-09: qty 1

## 2023-06-09 MED ORDER — MONTELUKAST SODIUM 10 MG PO TABS
10.0000 mg | ORAL_TABLET | Freq: Every day | ORAL | Status: DC
  Administered 2023-06-10 – 2023-06-14 (×5): 10 mg via ORAL
  Filled 2023-06-09 (×5): qty 1

## 2023-06-09 MED ORDER — BUSPIRONE HCL 5 MG PO TABS
5.0000 mg | ORAL_TABLET | Freq: Two times a day (BID) | ORAL | Status: DC
  Administered 2023-06-10 – 2023-06-11 (×3): 5 mg via ORAL
  Filled 2023-06-09 (×3): qty 1
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
## 2023-06-09 NOTE — ED Notes (Signed)
Report given to [facility] RN@[facility]  BMU

## 2023-06-09 NOTE — ED Notes (Signed)
Pt admitted to observation unit endorsing SI w/plan and intent to OD on meds dut to recent separation with spouse of 24 yrs due to him cheating on her. Pt tearful during interview. Unable to contract for safety. Will monitor for safety.

## 2023-06-09 NOTE — ED Notes (Signed)
Writer called and spoke with Lupita Leash RN@[facility]  BMU and informed her that pt has left the building to be transported and that we had given the pt for her potassium of 3.2

## 2023-06-09 NOTE — ED Notes (Signed)
Patient arrived on unit. Patient cooperative. Patient is conversing with RN expressing emotions and situation. Patient orientated to unit. Patient snacking on chips reports not being really hungry. Patient tracking well and responding properly. Patient safe on unit with continued monitoring.
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
## 2023-06-09 NOTE — ED Notes (Signed)
Safe transport called 

## 2023-06-09 NOTE — Discharge Instructions (Signed)
  Patient is to be transferred to [facility] for inpatient psychiatric treatment  

## 2023-06-09 NOTE — Progress Notes (Signed)
Patient admitted from [facility], report received from [place], [place]. Pt calm and pleasant during assessment. Pt stated she had SI because of the problems she is having with her husband. Husband cheated on her, got his own apartment and is staying with his mistress. Pt given education, support, and encouragement to be active in her treatment plan. Pt being monitored Q 15 minutes for safety per unit protocol, remains safe on the unit
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
## 2023-06-09 NOTE — Plan of Care (Signed)
Patient new to the unit tonight, hasn't had time to progress  Problem: Education: Goal: Knowledge of General Education information will improve Description: Including pain rating scale, medication(s)/side effects and non-pharmacologic comfort measures Outcome: Not Progressing   Problem: Health Behavior/Discharge Planning: Goal: Ability to manage health-related needs will improve Outcome: Not Progressing   Problem: Clinical Measurements: Goal: Ability to maintain clinical measurements within normal limits will improve Outcome: Not Progressing Goal: Will remain free from infection Outcome: Not Progressing Goal: Diagnostic test results will improve Outcome: Not Progressing Goal: Respiratory complications will improve Outcome: Not Progressing Goal: Cardiovascular complication will be avoided Outcome: Not Progressing   Problem: Activity: Goal: Risk for activity intolerance will decrease Outcome: Not Progressing   Problem: Nutrition: Goal: Adequate nutrition will be maintained Outcome: Not Progressing   Problem: Coping: Goal: Level of anxiety will decrease Outcome: Not Progressing   Problem: Elimination: Goal: Will not experience complications related to bowel motility Outcome: Not Progressing Goal: Will not experience complications related to urinary retention Outcome: Not Progressing   Problem: Pain Managment: Goal: General experience of comfort will improve Outcome: Not Progressing   Problem: Safety: Goal: Ability to remain free from injury will improve Outcome: Not Progressing   Problem: Skin Integrity: Goal: Risk for impaired skin integrity will decrease Outcome: Not Progressing   Problem: Education: Goal: Knowledge of [place] [facility] Education information/materials will improve Outcome: Not Progressing Goal: Emotional status will improve Outcome: Not Progressing Goal: Mental status will improve Outcome: Not Progressing Goal: Verbalization of  understanding the information provided will improve Outcome: Not Progressing   Problem: Activity: Goal: Interest or engagement in activities will improve Outcome: Not Progressing Goal: Sleeping patterns will improve Outcome: Not Progressing   Problem: Coping: Goal: Ability to verbalize frustrations and anger appropriately will improve Outcome: Not Progressing Goal: Ability to demonstrate self-control will improve Outcome: Not Progressing   Problem: Health Behavior/Discharge Planning: Goal: Identification of resources available to assist in meeting health care needs will improve Outcome: Not Progressing Goal: Compliance with treatment plan for underlying cause of condition will improve Outcome: Not Progressing   Problem: Physical Regulation: Goal: Ability to maintain clinical measurements within normal limits will improve Outcome: Not Progressing   Problem: Safety: Goal: Periods of time without injury will increase Outcome: Not Progressing

## 2023-06-09 NOTE — Tx Team (Signed)
Initial Treatment Plan 06/09/2023 10:45 PM Jennifer Davis ZOX:[id number]    PATIENT STRESSORS: Marital or family conflict   Medication change or noncompliance     PATIENT STRENGTHS: Ability for insight  Motivation for treatment/growth    PATIENT IDENTIFIED PROBLEMS: Depression  Suicidal Ideation  Anxiety                  DISCHARGE CRITERIA:  Motivation to continue treatment in a less acute level of care Verbal commitment to aftercare and medication compliance  PRELIMINARY DISCHARGE PLAN: Outpatient therapy Return to previous living arrangement  PATIENT/FAMILY INVOLVEMENT: This treatment plan has been presented to and reviewed with the patient, Jennifer Davis.  The patient has been given the opportunity to ask questions and make suggestions.  Elmyra Ricks, RN 06/09/2023, 10:45 PM

## 2023-06-10 DIAGNOSIS — F332 Major depressive disorder, recurrent severe without psychotic features: Secondary | ICD-10-CM | POA: Diagnosis not present

## 2023-06-10 MED ORDER — MELATONIN 5 MG PO TABS
5.0000 mg | ORAL_TABLET | Freq: Every evening | ORAL | Status: DC | PRN
  Administered 2023-06-10 – 2023-06-13 (×3): 5 mg via ORAL
  Filled 2023-06-10 (×3): qty 1

## 2023-06-10 MED ORDER — TAMOXIFEN CITRATE 10 MG PO TABS: 10.0000 mg | ORAL_TABLET | Freq: Every day | ORAL | Status: DC

## 2023-06-10 MED ORDER — DULOXETINE HCL 30 MG PO CPEP
90.0000 mg | ORAL_CAPSULE | Freq: Every day | ORAL | Status: DC
  Administered 2023-06-10: 90 mg via ORAL
  Filled 2023-06-10: qty 3

## 2023-06-10 MED ORDER — FLUTICASONE FUROATE-VILANTEROL 200-25 MCG/ACT IN AEPB
1.0000 | INHALATION_SPRAY | Freq: Every day | RESPIRATORY_TRACT | Status: DC
Start: 2023-06-10 — End: 2023-06-10

## 2023-06-10 MED ORDER — ESTRADIOL 0.1 MG/GM VA CREA
0.5000 g | TOPICAL_CREAM | VAGINAL | Status: DC
  Administered 2023-06-10: 0.5 g via VAGINAL
  Filled 2023-06-10: qty 42.5

## 2023-06-10 NOTE — H&P (Signed)
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 Thought content normal.        Cognition and Memory: Cognition and memory normal.        Judgment: Judgment normal.    Review of Systems  Constitutional:  Negative for chills and fever.  Respiratory:  Negative for shortness of breath.   Cardiovascular:  Negative for chest pain and palpitations.  Gastrointestinal:  Negative for abdominal pain.  Neurological:  Negative for headaches.  Psychiatric/Behavioral:  Positive for depression. The patient is nervous/anxious.    Blood pressure 137/82, pulse 88,  temperature 98.4 F (36.9 C), temperature source Oral, resp. rate 16, height 4\' 11"  (1.499 m), weight 71.4 kg, last menstrual period 12/10/2020, SpO2 96%. Body mass index is 31.79 kg/m.  Treatment Plan Summary: Daily contact with patient to assess and evaluate symptoms and progress in treatment, Medication management, and Plan pt's home medications reviewed with pt and restarted as appropriate.  Observation Level/Precautions:  15 minute checks  Laboratory:   no new labs  Psychotherapy:    Medications:    Consultations:    Discharge Concerns:    Estimated LOS:  Other:     Physician Treatment Plan for Primary Diagnosis: MDD (major depressive disorder), recurrent severe, without psychosis (HCC) Long Term Goal(s): Improvement in symptoms so as ready for discharge  Short Term Goals: Ability to identify changes in lifestyle to reduce recurrence of condition will improve, Ability to verbalize feelings will improve, Ability to identify and develop effective coping behaviors will improve, and Ability to identify triggers associated with substance abuse/mental health issues will improve  Physician Treatment Plan for Secondary Diagnosis: Principal Problem:   MDD (major depressive disorder), recurrent severe, without psychosis (HCC)  Long Term Goal(s): Improvement in symptoms so as ready for discharge  Short Term Goals: Ability to identify changes in lifestyle to reduce recurrence of condition will improve, Ability to verbalize feelings will improve, Ability to identify and develop effective coping behaviors will improve, and Ability to identify triggers associated with substance abuse/mental health issues will improve  I certify that inpatient services furnished can reasonably be expected to improve the patient's condition.    Lauree Chandler, NP 8/1/20244:10 PM

## 2023-06-10 NOTE — Group Note (Signed)
Recreation Therapy Group Note   Group Topic:Healthy Support Systems  Group Date: 06/10/2023 Start Time: 1010 End Time: 1100 Facilitators: Rosina Lowenstein, LRT, CTRS Location:  Craft Room  Group Description: Straw Bridge. Individually, patients were given 10 plastic drinking straws and an equal length of masking tape. Using the materials provided, patients were instructed to build a free-standing bridge-like structure to suspend an everyday item (ex: puzzle box or deck of cards) off the floor or table surface. All materials were required to be used in Secondary school teacher. LRT facilitated post-activity discussion reviewing how we, humans, are like the structure we built; when things get too heavy in our life and we do not have adequate supports/coping skills, then we will fall just like the straw-built structure will. LRT focused on how having a "base" or structure on the bottom was necessary for the object to stand, meaning we must be secure and stable first before building on ourselves or others. Patients were encouraged to name 2 healthy supports in their life and reflect on how the skills used in this activity can be generalized to daily life post discharge.   Goal Area(s) Addressed:  Patient will identify two healthy support systems in their life. Patient will work on Product manager. Patient will verbalize the importance of having a strong and steady "base".  Patient will follow multi-step directions. Patients will engage in creativity and use all provided materials.   Affect/Mood: Appropriate   Participation Level: Active and Engaged   Participation Quality: Independent   Behavior: Appropriate, Calm, and Cooperative   Speech/Thought Process: Coherent   Insight: Good   Judgement: Good   Modes of Intervention: Activity    Patient Response to Interventions:  Attentive, Engaged, Interested , and Receptive   Education Outcome:  Acknowledges education   Clinical  Observations/Individualized Feedback: Jennifer Davis was active in their participation of session activities and group discussion. Pt identified "my kids and my pets" as her healthy supports. Pt interacted well with LRT and peers duration of session.   Plan: Continue to engage patient in RT group sessions 2-3x/week.   Rosina Lowenstein, LRT, CTRS 06/10/2023 11:16 AM

## 2023-06-10 NOTE — Group Note (Signed)
Date:  06/10/2023 Time:  9:07 PM  Group Topic/Focus:  Wrap-Up Group:   The focus of this group is to help patients review their daily goal of treatment and discuss progress on daily workbooks.    Participation Level:  Active  Participation Quality:  Appropriate and Attentive  Affect:  Appropriate and Excited  Cognitive:  Alert and Appropriate  Insight: Appropriate and Good  Engagement in Group:  Developing/Improving and Engaged  Modes of Intervention:  Clarification, Education, Rapport Building, and Socialization  Additional Comments:     Constantina Laseter 06/10/2023, 9:07 PM

## 2023-06-10 NOTE — Plan of Care (Signed)
Patient denies SI/HI/AVH at this time. Compliant with medication administration per MD orders and procedures on the unit  Problem: Education: Goal: Knowledge of General Education information will improve Description: Including pain rating scale, medication(s)/side effects and non-pharmacologic comfort measures Outcome: Progressing   Problem: Health Behavior/Discharge Planning: Goal: Ability to manage health-related needs will improve Outcome: Progressing   Problem: Clinical Measurements: Goal: Ability to maintain clinical measurements within normal limits will improve Outcome: Progressing Goal: Will remain free from infection Outcome: Progressing Goal: Diagnostic test results will improve Outcome: Progressing Goal: Respiratory complications will improve Outcome: Progressing Goal: Cardiovascular complication will be avoided Outcome: Progressing   Problem: Activity: Goal: Risk for activity intolerance will decrease Outcome: Progressing   Problem: Nutrition: Goal: Adequate nutrition will be maintained Outcome: Progressing   Problem: Coping: Goal: Level of anxiety will decrease Outcome: Progressing   Problem: Elimination: Goal: Will not experience complications related to bowel motility Outcome: Progressing Goal: Will not experience complications related to urinary retention Outcome: Progressing   Problem: Pain Managment: Goal: General experience of comfort will improve Outcome: Progressing   Problem: Safety: Goal: Ability to remain free from injury will improve Outcome: Progressing   Problem: Skin Integrity: Goal: Risk for impaired skin integrity will decrease Outcome: Progressing   Problem: Education: Goal: Knowledge of [facility] Education information/materials will improve Outcome: Progressing Goal: Emotional status will improve Outcome: Progressing Goal: Mental status will improve Outcome: Progressing Goal: Verbalization of understanding the  information provided will improve Outcome: Progressing   Problem: Activity: Goal: Interest or engagement in activities will improve Outcome: Progressing Goal: Sleeping patterns will improve Outcome: Progressing   Problem: Coping: Goal: Ability to verbalize frustrations and anger appropriately will improve Outcome: Progressing Goal: Ability to demonstrate self-control will improve Outcome: Progressing   Problem: Health Behavior/Discharge Planning: Goal: Identification of resources available to assist in meeting health care needs will improve Outcome: Progressing Goal: Compliance with treatment plan for underlying cause of condition will improve Outcome: Progressing   Problem: Physical Regulation: Goal: Ability to maintain clinical measurements within normal limits will improve Outcome: Progressing   Problem: Safety: Goal: Periods of time without injury will increase Outcome: Progressing

## 2023-06-10 NOTE — Progress Notes (Signed)
Patient calm and cooperative during assessment denying SI/HI/AVH at this time. Pt observed interacting appropriately with staff and peers on the unit. Pt compliant with medication administration per MD orders. Pt given education, support, and encouragement to be active in her treatment plan. Pt being monitored Q 15 minutes for safety per unit protocol

## 2023-06-10 NOTE — Progress Notes (Signed)
Patient A&Ox4. Appropriate to situation. Calm and cooperative. Patient stated she slept fine last night and denies any pain or discomfort. Patient is med compliant and attended group earlier this morning. Patient endorses passive SI that comes and goes but denies HI and A/V/H with no plan or intent. Patient demonstrates adequate appetite and remains involved within the milieu. No s/s of current distress.

## 2023-06-10 NOTE — Plan of Care (Signed)
  Problem: Coping: Goal: Level of anxiety will decrease Outcome: Progressing   Problem: Safety: Goal: Ability to remain free from injury will improve Outcome: Progressing   Problem: Coping: Goal: Ability to verbalize frustrations and anger appropriately will improve Outcome: Progressing   Problem: Health Behavior/Discharge Planning: Goal: Compliance with treatment plan for underlying cause of condition will improve Outcome: Progressing

## 2023-06-10 NOTE — Progress Notes (Signed)
Suicide Risk Assessment  Admission Assessment    [facility] Admission Suicide Risk Assessment   Nursing information obtained from:  Patient Demographic factors:  Divorced or widowed (Marital issues) Current Mental Status:  Suicidal ideation indicated by patient Loss Factors:  Loss of significant relationship Historical Factors:  Impulsivity, Prior suicide attempts Risk Reduction Factors:  Positive social support, Positive therapeutic relationship  Total Time spent with patient: 1 hour Principal Problem: MDD (major depressive disorder), recurrent severe, without psychosis (HCC) Diagnosis:  Principal Problem:   MDD (major depressive disorder), recurrent severe, without psychosis (HCC)  Subjective Data: 47 y/o female w/ history of ptsd, mdd admitted following suicidal ideation with plan and intent.  Continued Clinical Symptoms:  Alcohol Use Disorder Identification Test Final Score (AUDIT): 5 The "Alcohol Use Disorders Identification Test", Guidelines for Use in Primary Care, Second Edition.  World Science writer [facility]). Score between 0-7:  no or low risk or alcohol related problems. Score between 8-15:  moderate risk of alcohol related problems. Score between 16-19:  high risk of alcohol related problems. Score 20 or above:  warrants further diagnostic evaluation for alcohol dependence and treatment.   CLINICAL FACTORS:   Depression:   Hopelessness Previous Psychiatric Diagnoses and Treatments   Musculoskeletal: Strength & Muscle Tone: within normal limits Gait & Station: normal Patient leans: N/A  Psychiatric Specialty Exam:  Presentation  General Appearance:  Appropriate for Environment  Eye Contact: Good  Speech: Clear and Coherent; Normal Rate; Other (comment) (some hoarseness)  Speech Volume: Normal  Handedness: Right   Mood and Affect  Mood: Depressed; Anxious  Affect: Tearful; Blunt   Thought Process  Thought Processes: Coherent; Goal Directed;  Linear  Descriptions of Associations:Intact  Orientation:Full (Time, Place and Person)  Thought Content:Logical  History of Schizophrenia/Schizoaffective disorder:No  Duration of Psychotic Symptoms:No data recorded Hallucinations:Hallucinations: None  Ideas of Reference:None  Suicidal Thoughts:Suicidal Thoughts: No  Homicidal Thoughts:Homicidal Thoughts: No   Sensorium  Memory: Immediate Good  Judgment: Fair  Insight: Fair   Executive Functions  Concentration: Good  Attention Span: Good  Recall: Good  Fund of Knowledge: Good  Language: Good   Psychomotor Activity  Psychomotor Activity: Psychomotor Activity: Normal   Assets  Assets: Communication Skills; Desire for Improvement; Financial Resources/Insurance; Housing; Leisure Time; Physical Health; Resilience; Social Support; Transportation; Vocational/Educational   Sleep  Sleep: Sleep: Fair    Physical Exam: Physical Exam see admission ROS see admission Blood pressure 137/82, pulse 88, temperature 98.4 F (36.9 C), temperature source Oral, resp. rate 16, height 4\' 11"  (1.499 m), weight 71.4 kg, last menstrual period 12/10/2020, SpO2 96%. Body mass index is 31.79 kg/m.   COGNITIVE FEATURES THAT CONTRIBUTE TO RISK:  None    SUICIDE RISK:  Moderate  PLAN OF CARE:  Daily contact with patient to assess and evaluate symptoms and progress in treatment Medication management  I certify that inpatient services furnished can reasonably be expected to improve the patient's condition.   Lauree Chandler, NP 06/10/2023, 4:13 PM

## 2023-06-10 NOTE — Group Note (Signed)
Date:  06/10/2023 Time:  10:18 AM  Group Topic/Focus:  Crisis Planning:   The purpose of this group is to help patients create a crisis plan for use upon discharge or in the future, as needed.    Participation Level:  Active  Participation Quality:  Appropriate, Attentive, Sharing, and Supportive  Affect:  Appropriate  Cognitive:  Alert, Appropriate, and Oriented  Insight: Appropriate  Engagement in Group:  Engaged  Modes of Intervention:  Rapport Building and Socialization  Additional Comments:    Rhea Kaelin 06/10/2023, 10:18 AM

## 2023-06-10 NOTE — Group Note (Signed)
Date:  06/10/2023 Time:  10:44 AM  Group Topic/Focus:  OUTDOOR RECREATION    Participation Level:  Active  Participation Quality:  Appropriate and Attentive  Affect:  Appropriate  Cognitive:  Alert and Appropriate  Insight: Appropriate  Engagement in Group:  Engaged  Modes of Intervention:  Activity and Socialization  Additional Comments:    Rosaura Carpenter 06/10/2023, 10:44 AM

## 2023-06-11 DIAGNOSIS — F332 Major depressive disorder, recurrent severe without psychotic features: Secondary | ICD-10-CM | POA: Diagnosis not present

## 2023-06-11 MED ORDER — VENLAFAXINE HCL ER 75 MG PO CP24
150.0000 mg | ORAL_CAPSULE | Freq: Every day | ORAL | Status: DC
  Administered 2023-06-12 – 2023-06-14 (×3): 150 mg via ORAL
  Filled 2023-06-11 (×3): qty 2

## 2023-06-11 MED ORDER — BUSPIRONE HCL 5 MG PO TABS
10.0000 mg | ORAL_TABLET | Freq: Two times a day (BID) | ORAL | Status: DC
  Administered 2023-06-11 – 2023-06-14 (×6): 10 mg via ORAL
  Filled 2023-06-11 (×6): qty 2

## 2023-06-11 MED ORDER — RISPERIDONE 1 MG PO TABS
0.5000 mg | ORAL_TABLET | ORAL | Status: DC
  Administered 2023-06-12 – 2023-06-14 (×5): 0.5 mg via ORAL
  Filled 2023-06-11 (×5): qty 1

## 2023-06-11 NOTE — Progress Notes (Signed)
[facility] MD Progress Note  06/11/2023 2:37 PM Jennifer Davis  MRN:  [id number] Subjective: Jennifer Davis is seen on rounds.  She is tearful throughout most of the interview.  She has been separated for the last couple years.  She wants to get back together but her husband has not been cooperative.  They have 3 children.  She continues to work after having cancer.  She goes to [facility] for therapy.  She has been on Cymbalta for quite some time and she is still having a lot of trouble.  I asked her about changing it and she was okay with that.  She has been on Prozac before but she does not like it.  I talked to her about Effexor and making some other changes. Principal Problem: MDD (major depressive disorder), recurrent severe, without psychosis (HCC) Diagnosis: Principal Problem:   MDD (major depressive disorder), recurrent severe, without psychosis (HCC)  Total Time spent with patient: 15 minutes  Past Psychiatric History: Depression  Past Medical History:  Past Medical History:  Diagnosis Date   Anxiety    Asthma    Asthma due to seasonal allergies    Back pain    Breast cancer (HCC)    Constipation    Depression    Diabetes mellitus without complication (HCC)    Dyspnea    after chemo - fatiqued   Fluid retention    GERD (gastroesophageal reflux disease)    occasionl   History of radiation therapy    left chest wall and subclavian 07/09/2021-08/22/2021   Dr Antony Blackbird   Joint pain    Lower extremity edema    Morbid obesity (HCC)    Palpitations 2017   tachy    Palpitations 01/09/2016   Prediabetes    PTSD (post-traumatic stress disorder)    SOB (shortness of breath)    Swallowing difficulty     Past Surgical History:  Procedure Laterality Date   BREAST RECONSTRUCTION WITH PLACEMENT OF TISSUE EXPANDER AND FLEX HD (ACELLULAR HYDRATED DERMIS) Bilateral 12/04/2020   Procedure: IMMEDIATE BILATERAL BREAST RECONSTRUCTION WITH PLACEMENT OF TISSUE EXPANDER AND FLEX HD (ACELLULAR HYDRATED  DERMIS);  Surgeon: Peggye Form, DO;  Location: [facility];  Service: Plastics;  Laterality: Bilateral;   BREAST RECONSTRUCTION WITH PLACEMENT OF TISSUE EXPANDER AND FLEX HD (ACELLULAR HYDRATED DERMIS) Right 09/28/2022   Procedure: BREAST RECONSTRUCTION WITH PLACEMENT OF TISSUE EXPANDER AND FLEX HD (ACELLULAR HYDRATED DERMIS);  Surgeon: Peggye Form, DO;  [facility];  Service: Plastics;  Laterality: Right;   CESAREAN SECTION     x3   DEBRIDEMENT AND CLOSURE WOUND Right 04/10/2021   Procedure: excision of right breast wound with closure;  Surgeon: Peggye Form, DO;  Location: [facility];  Service: Plastics;  Laterality: Right;   MASTECTOMY WITH AXILLARY LYMPH NODE DISSECTION Left 12/04/2020   Procedure: BILATERAL MASTECTOMIES, RADIOACTIVE SEED GUIDED TARGETED LEFT AXILLARY NODE DISSECTION;  Surgeon: Abigail Miyamoto, MD;  Location: [facility];  Service: General;  Laterality: Left;   PORT-A-CATH REMOVAL Left 08/27/2021   Procedure: REMOVAL PORT-A-CATH;  Surgeon: Abigail Miyamoto, MD;  Location: [facility];  Service: General;  Laterality: Left;   PORTACATH PLACEMENT Left 12/04/2020   Procedure: INSERTION PORT-A-CATH LEFT SUBCLAVIAN;  Surgeon: Abigail Miyamoto, MD;  Location: [facility];  Service: General;  Laterality: Left;   REMOVAL OF TISSUE EXPANDER AND PLACEMENT OF IMPLANT Right 01/04/2021   Procedure: REMOVAL OF TISSUE EXPANDER AND PLACEMENT OF NEW EXPANDER;  Surgeon: Peggye Form, DO;  Location: [facility];  Service: Plastics;  Laterality: Right;   REMOVAL OF TISSUE EXPANDER AND PLACEMENT OF IMPLANT Left 09/28/2022   Procedure: REMOVAL OF TISSUE EXPANDER AND REPLACEMENT OF EXPANDER;  Surgeon: Peggye Form, DO;  Location: [facility];  Service: Plastics;  Laterality: Left;   REMOVAL OF TISSUE EXPANDER AND PLACEMENT OF IMPLANT Bilateral 02/24/2023   Procedure: REMOVAL OF TISSUE EXPANDER AND  PLACEMENT OF IMPLANT;  Surgeon: Peggye Form, DO;  Location: [facility];  Service: Plastics;  Laterality: Bilateral;   TISSUE EXPANDER PLACEMENT Right 03/19/2021   Procedure: Removal of right tissue expander;  Surgeon: Peggye Form, DO;  Location: [facility];  Service: Plastics;  Laterality: Right;  45 min   Family History:  Family History  Problem Relation Age of Onset   Cancer Mother    Thyroid disease Mother    Hyperlipidemia Mother    Hypertension Mother    Fibromyalgia Mother    Colon cancer Mother 23   Depression Mother    Anxiety disorder Mother    Obesity Mother    Asthma Sister        x2   Hypertension Maternal Grandmother    Hypertension Maternal Grandfather    Cancer - Lung Maternal Grandfather    Stroke Maternal Grandfather    Insurance account manager Parkinson White syndrome Maternal Aunt 75   Colon cancer Other        MGM's niece, dx unknown age   Colon cancer Other        MGM's nephew; dx unknown age   Breast cancer Neg Hx    Family Psychiatric  History: Unremarkable Social History:  Social History   Substance and Sexual Activity  Alcohol Use Not Currently     Social History   Substance and Sexual Activity  Drug Use Never    Social History   Socioeconomic History   Marital status: Married    Spouse name: Onalee Hua   Number of children: 3   Years of education: associates   Highest education level: Not on file  Occupational History   Occupation: nursing unit clerk    Employer: [employer]  Tobacco Use   Smoking status: Former    Current packs/day: 0.00    Types: Cigarettes    Quit date: 01/09/2012    Years since quitting: 11.4   Smokeless tobacco: Never  Vaping Use   Vaping status: Never Used  Substance and Sexual Activity   Alcohol use: Not Currently   Drug use: Never   Sexual activity: Yes  Other Topics Concern   Not on file  Social History Narrative   epworth sleepiness scale = 10 (01/09/2016)   Social Determinants of Health    Financial Resource Strain: Low Risk  (11/21/2021)   Overall Financial Resource Strain (CARDIA)    Difficulty of Paying Living Expenses: Not very hard  Food Insecurity: No Food Insecurity (06/09/2023)   Hunger Vital Sign    Worried About Running Out of Food in the Last Year: Never true    Ran Out of Food in the Last Year: Never true  Transportation Needs: No Transportation Needs (06/09/2023)   PRAPARE - Administrator, Civil Service (Medical): No    Lack of Transportation (Non-Medical): No  Physical Activity: Inactive (11/21/2021)   Exercise Vital Sign    Days of Exercise per Week: 0 days    Minutes of Exercise per Session: 0 min  Stress: No Stress Concern Present (11/21/2021)  [employer] of Occupational Health - Occupational Stress Questionnaire    Feeling of Stress : Only a little  Social Connections: Moderately Integrated (11/21/2021)   Social Connection and Isolation Panel [NHANES]    Frequency of Communication with Friends and Family: Three times a week    Frequency of Social Gatherings with Friends and Family: Twice a week    Attends Religious Services: 1 to 4 times per year    Active Member of [employer] or Organizations: No    Attends Engineer, structural: Never    Marital Status: Married   Additional Social History:                         Sleep: Good  Appetite:  Good  Current Medications: Current Facility-Administered Medications  Medication Dose Route Frequency Provider Last Rate Last Admin   acetaminophen (TYLENOL) tablet 650 mg  650 mg Oral Q6H PRN Rankin, Shuvon B, NP       albuterol (VENTOLIN HFA) 108 (90 Base) MCG/ACT inhaler 2 puff  2 puff Inhalation Q4H PRN Rankin, Shuvon B, NP       alum & mag hydroxide-simeth (MAALOX/MYLANTA) 200-200-20 MG/5ML suspension 30 mL  30 mL Oral Q4H PRN Rankin, Shuvon B, NP       busPIRone (BUSPAR) tablet 10 mg  10 mg Oral BID Sarina Ill, DO       cholecalciferol (VITAMIN D3) 25 MCG  (1000 UNIT) tablet 2,000 Units  2,000 Units Oral Daily Rankin, Shuvon B, NP   2,000 Units at 06/11/23 0847   diphenhydrAMINE (BENADRYL) capsule 50 mg  50 mg Oral TID PRN Rankin, Shuvon B, NP       Or   diphenhydrAMINE (BENADRYL) injection 50 mg  50 mg Intramuscular TID PRN Rankin, Shuvon B, NP       EPINEPHrine (EPI-PEN) injection 0.3 mg  0.3 mg Intramuscular Once PRN Rankin, Shuvon B, NP       estradiol (ESTRACE) vaginal cream 0.5 g  0.5 g Vaginal Once per day on Monday Thursday Lauree Chandler, NP   0.5 g at 06/10/23 2112   gabapentin (NEURONTIN) capsule 100 mg  100 mg Oral TID Sarina Ill, DO   100 mg at 06/11/23 1330   loratadine (CLARITIN) tablet 10 mg  10 mg Oral Daily Rankin, Shuvon B, NP   10 mg at 06/11/23 0844   magnesium hydroxide (MILK OF MAGNESIA) suspension 30 mL  30 mL Oral Daily PRN Rankin, Shuvon B, NP   30 mL at 06/11/23 1339   melatonin tablet 5 mg  5 mg Oral QHS PRN Lauree Chandler, NP   5 mg at 06/10/23 2112   mometasone-formoterol (DULERA) 200-5 MCG/ACT inhaler 2 puff  2 puff Inhalation BID Rankin, Shuvon B, NP   2 puff at 06/11/23 0845   montelukast (SINGULAIR) tablet 10 mg  10 mg Oral Daily Rankin, Shuvon B, NP   10 mg at 06/11/23 0839   multivitamin with minerals tablet 1 tablet  1 tablet Oral Q1200 Rankin, Shuvon B, NP   1 tablet at 06/11/23 1204   predniSONE (DELTASONE) tablet 40 mg  40 mg Oral Q breakfast Rankin, Shuvon B, NP   40 mg at 06/11/23 0841   [START ON 06/12/2023] risperiDONE (RISPERDAL) tablet 0.5 mg  0.5 mg Oral [id number] Sarina Ill, DO       tamoxifen (NOLVADEX) tablet 10 mg  10 mg Oral Daily Rankin, Shuvon B, NP   10  mg at 06/11/23 0842   [START ON 06/12/2023] venlafaxine XR (EFFEXOR-XR) 24 hr capsule 150 mg  150 mg Oral Q breakfast Sarina Ill, DO        Lab Results:  Results for orders placed or performed during the hospital encounter of 06/09/23 (from the past 48 hour(s))  CBC with Differential/Platelet      Status: None   Collection Time: 06/09/23  6:45 PM  Result Value Ref Range   WBC 10.4 4.0 - 10.5 K/uL   RBC 4.71 3.87 - 5.11 MIL/uL   Hemoglobin 13.9 12.0 - 15.0 g/dL   HCT 16.1 09.6 - 04.5 %   MCV 89.2 80.0 - 100.0 fL   MCH 29.5 26.0 - 34.0 pg   MCHC 33.1 30.0 - 36.0 g/dL   RDW 40.9 81.1 - 91.4 %   Platelets 374 150 - 400 K/uL   nRBC 0.0 0.0 - 0.2 %   Neutrophils Relative % 54 %   Neutro Abs 5.7 1.7 - 7.7 K/uL   Lymphocytes Relative 36 %   Lymphs Abs 3.8 0.7 - 4.0 K/uL   Monocytes Relative 6 %   Monocytes Absolute 0.7 0.1 - 1.0 K/uL   Eosinophils Relative 2 %   Eosinophils Absolute 0.2 0.0 - 0.5 K/uL   Basophils Relative 1 %   Basophils Absolute 0.1 0.0 - 0.1 K/uL   Immature Granulocytes 1 %   Abs Immature Granulocytes 0.05 0.00 - 0.07 K/uL    Comment: Performed at [facility], [address], [place], [place] [address]  Comprehensive metabolic panel     Status: Abnormal   Collection Time: 06/09/23  6:45 PM  Result Value Ref Range   Sodium 138 135 - 145 mmol/L   Potassium 3.2 (L) 3.5 - 5.1 mmol/L   Chloride 98 98 - 111 mmol/L   CO2 27 22 - 32 mmol/L   Glucose, Bld 121 (H) 70 - 99 mg/dL    Comment: Glucose reference range applies only to samples taken after fasting for at least 8 hours.   BUN 10 6 - 20 mg/dL   Creatinine, Ser 6.21 0.44 - 1.00 mg/dL   Calcium 9.5 8.9 - 30.8 mg/dL   Total Protein 7.2 6.5 - 8.1 g/dL   Albumin 4.0 3.5 - 5.0 g/dL   AST 37 15 - 41 U/L   ALT 37 0 - 44 U/L   Alkaline Phosphatase 56 38 - 126 U/L   Total Bilirubin 0.3 0.3 - 1.2 mg/dL   GFR, Estimated >65 >78 mL/min    Comment: (NOTE) Calculated using the CKD-EPI Creatinine Equation (2021)    Anion gap 13 5 - 15    Comment: Performed at [facility], [address] [place], [place] [address]  Hemoglobin A1c     Status: Abnormal   Collection Time: 06/09/23  6:45 PM  Result Value Ref Range   Hgb A1c MFr Bld 5.8 (H) 4.8 - 5.6 %    Comment: (NOTE) Pre diabetes:           5.7%-6.4%  Diabetes:              >6.4%  Glycemic control for   <7.0% adults with diabetes    Mean Plasma Glucose 119.76 mg/dL    Comment: Performed at [facility], [address] [place], [place] [address]  Magnesium     Status: None   Collection Time: 06/09/23  6:45 PM  Result Value Ref Range   Magnesium  2.3 1.7 - 2.4 mg/dL    Comment: Performed at [facility], [address] [place], [place] [address]  Ethanol     Status: None   Collection Time: 06/09/23  6:45 PM  Result Value Ref Range   Alcohol, Ethyl (B) <10 <10 mg/dL    Comment: (NOTE) Lowest detectable limit for serum alcohol is 10 mg/dL.  For medical purposes only. Performed at [facility], [address], [place], [place] [address]   Lipid panel     Status: Abnormal   Collection Time: 06/09/23  6:45 PM  Result Value Ref Range   Cholesterol 240 (H) 0 - 200 mg/dL   Triglycerides 098 <119 mg/dL   HDL 75 >14 mg/dL   Total CHOL/HDL Ratio 3.2 RATIO   VLDL 24 0 - 40 mg/dL   LDL Cholesterol 782 (H) 0 - 99 mg/dL    Comment:        Total Cholesterol/HDL:CHD Risk Coronary Heart Disease Risk Table                     Men   Women  1/2 Average Risk   3.4   3.3  Average Risk       5.0   4.4  2 X Average Risk   9.6   7.1  3 X Average Risk  23.4   11.0        Use the calculated Patient Ratio above and the CHD Risk Table to determine the patient's CHD Risk.        ATP III CLASSIFICATION (LDL):  <100     mg/dL   Optimal  956-213  mg/dL   Near or Above                    Optimal  130-159  mg/dL   Borderline  086-578  mg/dL   High  >469     mg/dL   Very High Performed at [facility], [address], [place], [place] [address]   TSH     Status: None   Collection Time: 06/09/23  6:45 PM  Result Value Ref Range   TSH 1.330 0.350 - 4.500 uIU/mL    Comment: Performed by a 3rd Generation assay with a functional sensitivity of <=0.01 uIU/mL. Performed at [facility], [address], [place], [place] [address]   Prolactin     Status: None   Collection Time: 06/09/23  6:45 PM  Result Value Ref Range   Prolactin 8.4 4.8 - 33.4 ng/mL    Comment: (NOTE) Performed At: [facility] [address] [place], [place] [id number] Jolene Schimke MD [id number]   Urinalysis, Routine w reflex microscopic -Urine, Clean Catch     Status: Abnormal   Collection Time: 06/09/23  7:00 PM  Result Value Ref Range   Color, Urine YELLOW YELLOW   APPearance HAZY (A) CLEAR   Specific Gravity, Urine 1.021 1.005 - 1.030   pH 6.0 5.0 - 8.0   Glucose, UA NEGATIVE NEGATIVE mg/dL   Hgb urine dipstick NEGATIVE NEGATIVE   Bilirubin Urine NEGATIVE NEGATIVE   Ketones, ur 80 (A) NEGATIVE mg/dL   Protein, ur 30 (A) NEGATIVE mg/dL   Nitrite NEGATIVE NEGATIVE   Leukocytes,Ua NEGATIVE NEGATIVE   RBC / HPF 0-5 0 - 5 RBC/hpf   WBC, UA 0-5 0 - 5 WBC/hpf   Bacteria, UA RARE (A) NONE SEEN   Squamous Epithelial / HPF 0-5 0 -  5 /HPF   Mucus PRESENT     Comment: Performed at [facility], [address] [place], [place] [address]  POCT Urine Drug Screen - (I-Screen)     Status: Normal   Collection Time: 06/09/23  7:00 PM  Result Value Ref Range   POC Amphetamine UR None Detected NONE DETECTED (Cut Off Level 1000 ng/mL)   POC Secobarbital (BAR) None Detected NONE DETECTED (Cut Off Level 300 ng/mL)   POC Buprenorphine (BUP) None Detected NONE DETECTED (Cut Off Level 10 ng/mL)   POC Oxazepam (BZO) None Detected NONE DETECTED (Cut Off Level 300 ng/mL)   POC Cocaine UR None Detected NONE DETECTED (Cut Off Level 300 ng/mL)   POC Methamphetamine UR None Detected NONE DETECTED (Cut Off Level 1000 ng/mL)   POC Morphine None Detected NONE DETECTED (Cut Off Level 300 ng/mL)   POC Methadone UR None Detected NONE DETECTED (Cut Off Level 300 ng/mL)   POC Oxycodone UR None Detected NONE DETECTED (Cut Off Level 100 ng/mL)   POC Marijuana UR None Detected NONE DETECTED (Cut Off Level 50 ng/mL)  POC  urine preg, ED     Status: Normal   Collection Time: 06/09/23  7:00 PM  Result Value Ref Range   Preg Test, Ur Negative Negative  Pregnancy, urine POC     Status: None   Collection Time: 06/09/23  7:17 PM  Result Value Ref Range   Preg Test, Ur NEGATIVE NEGATIVE    Comment:        THE SENSITIVITY OF THIS METHODOLOGY IS >24 mIU/mL     Blood Alcohol level:  Lab Results  Component Value Date   ETH <10 06/09/2023    Metabolic Disorder Labs: Lab Results  Component Value Date   HGBA1C 5.8 (H) 06/09/2023   MPG 119.76 06/09/2023   MPG 108.28 09/22/2022   Lab Results  Component Value Date   PROLACTIN 8.4 06/09/2023   Lab Results  Component Value Date   CHOL 240 (H) 06/09/2023   TRIG 119 06/09/2023   HDL 75 06/09/2023   CHOLHDL 3.2 06/09/2023   VLDL 24 06/09/2023   LDLCALC 141 (H) 06/09/2023   LDLCALC 132 (H) 12/03/2021    Physical Findings: AIMS:  , ,  ,  ,    CIWA:    COWS:     Musculoskeletal: Strength & Muscle Tone: within normal limits Gait & Station: normal Patient leans: N/A  Psychiatric Specialty Exam:  Presentation  General Appearance:  Appropriate for Environment  Eye Contact: Good  Speech: Clear and Coherent; Normal Rate; Other (comment) (some hoarseness)  Speech Volume: Normal  Handedness: Right   Mood and Affect  Mood: Depressed; Anxious  Affect: Tearful; Blunt   Thought Process  Thought Processes: Coherent; Goal Directed; Linear  Descriptions of Associations:Intact  Orientation:Full (Time, Place and Person)  Thought Content:Logical  History of Schizophrenia/Schizoaffective disorder:No  Duration of Psychotic Symptoms:No data recorded Hallucinations:Hallucinations: None  Ideas of Reference:None  Suicidal Thoughts:Suicidal Thoughts: No  Homicidal Thoughts:Homicidal Thoughts: No   Sensorium  Memory: Immediate Good  Judgment: Fair  Insight: Fair   Executive Functions  Concentration: Good  Attention  Span: Good  Recall: Good  Fund of Knowledge: Good  Language: Good   Psychomotor Activity  Psychomotor Activity: Psychomotor Activity: Normal   Assets  Assets: Communication Skills; Desire for Improvement; Financial Resources/Insurance; Housing; Leisure Time; Physical Health; Resilience; Social Support; Transportation; Vocational/Educational   Sleep  Sleep: Sleep: Fair     Blood pressure 117/72, pulse 94,  temperature 98.1 F (36.7 C), temperature source Oral, resp. rate 19, height 4\' 11"  (1.499 m), weight 71.4 kg, last menstrual period 12/10/2020, SpO2 100%. Body mass index is 31.79 kg/m.   Treatment Plan Summary: Daily contact with patient to assess and evaluate symptoms and progress in treatment, Medication management, and Plan discontinue Cymbalta and start Effexor XR 150 mg in the morning.  Start Risperdal 0.5 mg twice a day.  Increase BuSpar to 10 mg twice a day.  Sarina Ill, DO 06/11/2023, 2:37 PM

## 2023-06-11 NOTE — BH IP Treatment Plan (Signed)
Interdisciplinary Treatment and Diagnostic Plan Update  06/11/2023 Time of Session: 9:19AM Jennifer Davis MRN: [id number]  Principal Diagnosis: MDD (major depressive disorder), recurrent severe, without psychosis (HCC)  Secondary Diagnoses: Principal Problem:   MDD (major depressive disorder), recurrent severe, without psychosis (HCC)   Current Medications:  Current Facility-Administered Medications  Medication Dose Route Frequency Provider Last Rate Last Admin   acetaminophen (TYLENOL) tablet 650 mg  650 mg Oral Q6H PRN Rankin, Shuvon B, NP       albuterol (VENTOLIN HFA) 108 (90 Base) MCG/ACT inhaler 2 puff  2 puff Inhalation Q4H PRN Rankin, Shuvon B, NP       alum & mag hydroxide-simeth (MAALOX/MYLANTA) 200-200-20 MG/5ML suspension 30 mL  30 mL Oral Q4H PRN Rankin, Shuvon B, NP       busPIRone (BUSPAR) tablet 5 mg  5 mg Oral BID Rankin, Shuvon B, NP   5 mg at 06/11/23 0847   cholecalciferol (VITAMIN D3) 25 MCG (1000 UNIT) tablet 2,000 Units  2,000 Units Oral Daily Rankin, Shuvon B, NP   2,000 Units at 06/11/23 0847   diphenhydrAMINE (BENADRYL) capsule 50 mg  50 mg Oral TID PRN Rankin, Shuvon B, NP       Or   diphenhydrAMINE (BENADRYL) injection 50 mg  50 mg Intramuscular TID PRN Rankin, Shuvon B, NP       DULoxetine (CYMBALTA) DR capsule 90 mg  90 mg Oral QHS Lauree Chandler, NP   90 mg at 06/10/23 2112   EPINEPHrine (EPI-PEN) injection 0.3 mg  0.3 mg Intramuscular Once PRN Rankin, Shuvon B, NP       estradiol (ESTRACE) vaginal cream 0.5 g  0.5 g Vaginal Once per day on Monday Thursday Lauree Chandler, NP   0.5 g at 06/10/23 2112   gabapentin (NEURONTIN) capsule 100 mg  100 mg Oral TID Sarina Ill, DO   100 mg at 06/11/23 1032   loratadine (CLARITIN) tablet 10 mg  10 mg Oral Daily Rankin, Shuvon B, NP   10 mg at 06/11/23 0844   magnesium hydroxide (MILK OF MAGNESIA) suspension 30 mL  30 mL Oral Daily PRN Rankin, Shuvon B, NP       melatonin tablet 5 mg  5 mg Oral QHS  PRN Lauree Chandler, NP   5 mg at 06/10/23 2112   mometasone-formoterol (DULERA) 200-5 MCG/ACT inhaler 2 puff  2 puff Inhalation BID Rankin, Shuvon B, NP   2 puff at 06/10/23 2112   montelukast (SINGULAIR) tablet 10 mg  10 mg Oral Daily Rankin, Shuvon B, NP   10 mg at 06/11/23 5284   multivitamin with minerals tablet 1 tablet  1 tablet Oral Q1200 Rankin, Shuvon B, NP   1 tablet at 06/10/23 1210   predniSONE (DELTASONE) tablet 40 mg  40 mg Oral Q breakfast Rankin, Shuvon B, NP   40 mg at 06/11/23 0841   tamoxifen (NOLVADEX) tablet 10 mg  10 mg Oral Daily Rankin, Shuvon B, NP   10 mg at 06/11/23 1324   PTA Medications: Medications Prior to Admission  Medication Sig Dispense Refill Last Dose   albuterol (VENTOLIN HFA) 108 (90 Base) MCG/ACT inhaler Inhale 2 puffs into the lungs every 4-6 hours as needed 6.7 g 0    benzonatate (TESSALON) 100 MG capsule Take 1 capsule (100 mg total) by mouth 3 (three) times daily as needed. 20 capsule 0    budesonide-formoterol (SYMBICORT) 160-4.5 MCG/ACT inhaler Inhale 2 puffs into the lungs 2 (two) times  daily. 10.2 g 5    busPIRone (BUSPAR) 5 MG tablet Take 1 tablet (5 mg total) by mouth 2 (two) times daily. (Patient taking differently: Take 5 mg by mouth 2 (two) times daily. prn) 60 tablet 5    cetirizine (ZYRTEC) 10 MG tablet Take 10 mg by mouth daily.      Cholecalciferol (VITAMIN D) 50 MCG (2000 UT) tablet Take 1 tablet (2,000 Units total) by mouth daily.      diphenhydrAMINE (BENADRYL) 25 MG tablet Take 50 mg by mouth every 6 (six) hours as needed (hives).      DULoxetine (CYMBALTA) 30 MG capsule Take 3 capsules (90 mg total) by mouth daily. 270 capsule 3    EPINEPHrine 0.3 mg/0.3 mL IJ SOAJ injection Use as directed for anaphylaxis (Patient not taking: Reported on 03/30/2023) 2 each 1    estradiol (ESTRACE) 0.1 MG/GM vaginal cream Insert 0.5 grams vaginally 2 (two) times a week as directed 42.5 g 6    fluconazole (DIFLUCAN) 150 MG tablet Take 1 tablet (150  mg total) by mouth as directed, may repeat 1 tablet in 3 days 2 tablet 0    fluticasone (FLONASE) 50 MCG/ACT nasal spray Place 2 sprays into both nostrils daily. 16 g 0    gabapentin (NEURONTIN) 100 MG capsule Take 1 capsule (100 mg total) by mouth 3 (three) times daily. 270 capsule 0    ibuprofen (ADVIL) 200 MG tablet Take 400 mg by mouth every 6 (six) hours as needed for moderate pain.      melatonin 5 MG TABS Take 5 mg by mouth at bedtime as needed (sleep).      montelukast (SINGULAIR) 10 MG tablet Take 1 tablet (10 mg total) by mouth daily. 30 tablet 3    Multiple Vitamin (MULTI VITAMIN) TABS 1 tablet Orally Once a day      [EXPIRED] predniSONE (DELTASONE) 20 MG tablet Take 2 tablets (40 mg total) by mouth daily with breakfast for 5 days. 10 tablet 0    Probiotic Product ([employer] PROBIOTIC PO) Take 1 capsule by mouth daily.      tamoxifen (NOLVADEX) 10 MG tablet Take 1 tablet (10 mg total) by mouth daily. 90 tablet 3    tirzepatide (MOUNJARO) 5 MG/0.5ML Pen Inject 5 mg into the skin once a week. 6 mL 0     Patient Stressors: Marital or family conflict   Medication change or noncompliance    Patient Strengths: Ability for insight  Motivation for treatment/growth   Treatment Modalities: Medication Management, Group therapy, Case management,  1 to 1 session with clinician, Psychoeducation, Recreational therapy.   Physician Treatment Plan for Primary Diagnosis: MDD (major depressive disorder), recurrent severe, without psychosis (HCC) Long Term Goal(s): Improvement in symptoms so as ready for discharge   Short Term Goals: Ability to identify changes in lifestyle to reduce recurrence of condition will improve Ability to verbalize feelings will improve Ability to identify and develop effective coping behaviors will improve Ability to identify triggers associated with substance abuse/mental health issues will improve  Medication Management: Evaluate patient's response, side effects, and  tolerance of medication regimen.  Therapeutic Interventions: 1 to 1 sessions, Unit Group sessions and Medication administration.  Evaluation of Outcomes: Progressing  Physician Treatment Plan for Secondary Diagnosis: Principal Problem:   MDD (major depressive disorder), recurrent severe, without psychosis (HCC)  Long Term Goal(s): Improvement in symptoms so as ready for discharge   Short Term Goals: Ability to identify changes in lifestyle to reduce recurrence of  condition will improve Ability to verbalize feelings will improve Ability to identify and develop effective coping behaviors will improve Ability to identify triggers associated with substance abuse/mental health issues will improve     Medication Management: Evaluate patient's response, side effects, and tolerance of medication regimen.  Therapeutic Interventions: 1 to 1 sessions, Unit Group sessions and Medication administration.  Evaluation of Outcomes: Progressing   RN Treatment Plan for Primary Diagnosis: MDD (major depressive disorder), recurrent severe, without psychosis (HCC) Long Term Goal(s): Knowledge of disease and therapeutic regimen to maintain health will improve  Short Term Goals: Ability to demonstrate self-control, Ability to participate in decision making will improve, Ability to verbalize feelings will improve, Ability to disclose and discuss suicidal ideas, Ability to identify and develop effective coping behaviors will improve, and Compliance with prescribed medications will improve  Medication Management: RN will administer medications as ordered by provider, will assess and evaluate patient's response and provide education to patient for prescribed medication. RN will report any adverse and/or side effects to prescribing provider.  Therapeutic Interventions: 1 on 1 counseling sessions, Psychoeducation, Medication administration, Evaluate responses to treatment, Monitor vital signs and CBGs as ordered,  Perform/monitor CIWA, COWS, AIMS and Fall Risk screenings as ordered, Perform wound care treatments as ordered.  Evaluation of Outcomes: Progressing   LCSW Treatment Plan for Primary Diagnosis: MDD (major depressive disorder), recurrent severe, without psychosis (HCC) Long Term Goal(s): Safe transition to appropriate next level of care at discharge, Engage patient in therapeutic group addressing interpersonal concerns.  Short Term Goals: Engage patient in aftercare planning with referrals and resources, Increase social support, Increase ability to appropriately verbalize feelings, Increase emotional regulation, Facilitate acceptance of mental health diagnosis and concerns, and Increase skills for wellness and recovery  Therapeutic Interventions: Assess for all discharge needs, 1 to 1 time with Social worker, Explore available resources and support systems, Assess for adequacy in community support network, Educate family and significant other(s) on suicide prevention, Complete Psychosocial Assessment, Interpersonal group therapy.  Evaluation of Outcomes: Progressing   Progress in Treatment: Attending groups: Yes. Participating in groups: Yes. Taking medication as prescribed: Yes. Toleration medication: Yes. Family/Significant other contact made: No, will contact:  once permission is given Patient understands diagnosis: Yes. Discussing patient identified problems/goals with staff: Yes. Medical problems stabilized or resolved: Yes. Denies suicidal/homicidal ideation: Yes. Issues/concerns per patient self-inventory: No. Other: none  New problem(s) identified: No, Describe:  none  New Short Term/Long Term Goal(s): detox, elimination of symptoms of psychosis, medication management for mood stabilization; elimination of SI thoughts; development of comprehensive mental wellness/sobriety plan.   Patient Goals:  "I'm not sure"  Discharge Plan or Barriers: CSW to assist in the development of  appropriate discharge plans.   Reason for Continuation of Hospitalization: Anxiety Depression Medication stabilization Suicidal ideation  Estimated Length of Stay: 1-7 days  Last 3 [place] Suicide Severity Risk Score: Flowsheet Row Admission (Current) from 06[facility]09/2023 in [facility] INPAT[facility]ICINE Most recent reading at 06/09/2023 10:34 PM ED from 06/09/2023 in [facility] Most recent reading at 06/09/2023  7:05 PM ED from 03/16/2023 in [facility] at [facility] Most recent reading at 03/16/2023  3:19 PM  C-SSRS RISK CATEGORY Moderate Risk High Risk No Risk       Last PHQ 2/9 Scores:    12/03/2021    3:53 PM 11/21/2021    2:44 PM  Depression screen PHQ 2/9  Decreased Interest 1 0  Down, Depressed, Hopeless 1 0  PHQ - 2 Score 2 0  Altered sleeping 2   Tired, decreased energy 1   Change in appetite 1   Feeling bad or failure about yourself  0   Trouble concentrating 1   Moving slowly or fidgety/restless 0   Suicidal thoughts 0   PHQ-9 Score 7   Difficult doing work/chores Not difficult at all     Scribe for Treatment Team: Harden Mo, LCSW 06/11/2023 11:38 AM
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
## 2023-06-11 NOTE — Group Note (Signed)
Recreation Therapy Group Note   Group Topic:Leisure Education  Group Date: 06/11/2023 Start Time: 1030 End Time: 1130 Facilitators: Rosina Lowenstein, LRT, CTRS Location:  Craft Room  Group Description: Leisure. Patients were given the option to choose from coloring, singing karaoke, making origami, or playing cards. LRT and pts discussed the meaning of leisure, the importance of participating in leisure during their free time/when they're outside of the hospital, as well as how our leisure interests can also serve as coping skills. Pt identified two leisure interests and shared with the group.   Goal Area(s) Addressed:  Patient will identify a current leisure interest.  Patient will learn the definition of "leisure". Patient will practice making a positive decision. Patient will have the opportunity to try a new leisure activity. Patient will communicate with peers and LRT.    Affect/Mood: Appropriate   Participation Level: Active and Engaged   Participation Quality: Independent   Behavior: Calm and Cooperative   Speech/Thought Process: Coherent   Insight: Good   Judgement: Good   Modes of Intervention: Activity   Patient Response to Interventions:  Attentive, Engaged, Interested , and Receptive   Education Outcome:  Acknowledges education   Clinical Observations/Individualized Feedback: Jennifer Davis was active in their participation of session activities and group discussion. Pt identified "I work and clean" as things she does in her free time. Pt chose to color while in group. Pt interacted well with LRT and peers duration of session.   Plan: Continue to engage patient in RT group sessions 2-3x/week.   Rosina Lowenstein, LRT, CTRS 06/11/2023 12:31 PM

## 2023-06-11 NOTE — Progress Notes (Signed)
   06/11/23 0844  Psych Admission Type (Psych Patients Only)  Admission Status Voluntary  Psychosocial Assessment  Patient Complaints Anxiety;Depression  Eye Contact Fair  Facial Expression Anxious;Flat  Affect Depressed;Blunted  Speech Logical/coherent  Interaction Assertive  Motor Activity Slow  Appearance/Hygiene Unremarkable  Behavior Characteristics Cooperative  Mood Depressed  Thought Process  Coherency WDL  Content WDL  Delusions None reported or observed  Perception WDL  Hallucination None reported or observed  Judgment WDL  Confusion None  Danger to Self  Current suicidal ideation? Denies  Agreement Not to Harm Self Yes  Description of Agreement Verbal  Danger to Others  Danger to Others None reported or observed   Patient is alert and oriented X4.  Patient denies SI/HI/AVH.  Patient is depressed and tearful.  Patient denies pain.  Patient is pleasant and cooperative,  Patient received PRN med for constipation.  Patient continues on Q 15 minute checks.

## 2023-06-11 NOTE — BH IP Treatment Plan (Signed)
Interdisciplinary Treatment and Diagnostic Plan Update  06/11/2023 Time of Session: 9:19AM Jennifer Davis MRN: [id number]  Principal Diagnosis: MDD (major depressive disorder), recurrent severe, without psychosis (HCC)  Secondary Diagnoses: Principal Problem:   MDD (major depressive disorder), recurrent severe, without psychosis (HCC)   Current Medications:  Current Facility-Administered Medications  Medication Dose Route Frequency Provider Last Rate Last Admin   acetaminophen (TYLENOL) tablet 650 mg  650 mg Oral Q6H PRN Rankin, Shuvon B, NP       albuterol (VENTOLIN HFA) 108 (90 Base) MCG/ACT inhaler 2 puff  2 puff Inhalation Q4H PRN Rankin, Shuvon B, NP       alum & mag hydroxide-simeth (MAALOX/MYLANTA) 200-200-20 MG/5ML suspension 30 mL  30 mL Oral Q4H PRN Rankin, Shuvon B, NP       busPIRone (BUSPAR) tablet 5 mg  5 mg Oral BID Rankin, Shuvon B, NP   5 mg at 06/11/23 0847   cholecalciferol (VITAMIN D3) 25 MCG (1000 UNIT) tablet 2,000 Units  2,000 Units Oral Daily Rankin, Shuvon B, NP   2,000 Units at 06/11/23 0847   diphenhydrAMINE (BENADRYL) capsule 50 mg  50 mg Oral TID PRN Rankin, Shuvon B, NP       Or   diphenhydrAMINE (BENADRYL) injection 50 mg  50 mg Intramuscular TID PRN Rankin, Shuvon B, NP       DULoxetine (CYMBALTA) DR capsule 90 mg  90 mg Oral QHS Lauree Chandler, NP   90 mg at 06/10/23 2112   EPINEPHrine (EPI-PEN) injection 0.3 mg  0.3 mg Intramuscular Once PRN Rankin, Shuvon B, NP       estradiol (ESTRACE) vaginal cream 0.5 g  0.5 g Vaginal Once per day on Monday Thursday Lauree Chandler, NP   0.5 g at 06/10/23 2112   gabapentin (NEURONTIN) capsule 100 mg  100 mg Oral TID Sarina Ill, DO   100 mg at 06/11/23 1032   loratadine (CLARITIN) tablet 10 mg  10 mg Oral Daily Rankin, Shuvon B, NP   10 mg at 06/11/23 0844   magnesium hydroxide (MILK OF MAGNESIA) suspension 30 mL  30 mL Oral Daily PRN Rankin, Shuvon B, NP       melatonin tablet 5 mg  5 mg Oral QHS  PRN Lauree Chandler, NP   5 mg at 06/10/23 2112   mometasone-formoterol (DULERA) 200-5 MCG/ACT inhaler 2 puff  2 puff Inhalation BID Rankin, Shuvon B, NP   2 puff at 06/10/23 2112   montelukast (SINGULAIR) tablet 10 mg  10 mg Oral Daily Rankin, Shuvon B, NP   10 mg at 06/11/23 4098   multivitamin with minerals tablet 1 tablet  1 tablet Oral Q1200 Rankin, Shuvon B, NP   1 tablet at 06/10/23 1210   predniSONE (DELTASONE) tablet 40 mg  40 mg Oral Q breakfast Rankin, Shuvon B, NP   40 mg at 06/11/23 0841   tamoxifen (NOLVADEX) tablet 10 mg  10 mg Oral Daily Rankin, Shuvon B, NP   10 mg at 06/11/23 1191   PTA Medications: Medications Prior to Admission  Medication Sig Dispense Refill Last Dose   albuterol (VENTOLIN HFA) 108 (90 Base) MCG/ACT inhaler Inhale 2 puffs into the lungs every 4-6 hours as needed 6.7 g 0    benzonatate (TESSALON) 100 MG capsule Take 1 capsule (100 mg total) by mouth 3 (three) times daily as needed. 20 capsule 0    budesonide-formoterol (SYMBICORT) 160-4.5 MCG/ACT inhaler Inhale 2 puffs into the lungs 2 (two) times  daily. 10.2 g 5    busPIRone (BUSPAR) 5 MG tablet Take 1 tablet (5 mg total) by mouth 2 (two) times daily. (Patient taking differently: Take 5 mg by mouth 2 (two) times daily. prn) 60 tablet 5    cetirizine (ZYRTEC) 10 MG tablet Take 10 mg by mouth daily.      Cholecalciferol (VITAMIN D) 50 MCG (2000 UT) tablet Take 1 tablet (2,000 Units total) by mouth daily.      diphenhydrAMINE (BENADRYL) 25 MG tablet Take 50 mg by mouth every 6 (six) hours as needed (hives).      DULoxetine (CYMBALTA) 30 MG capsule Take 3 capsules (90 mg total) by mouth daily. 270 capsule 3    EPINEPHrine 0.3 mg/0.3 mL IJ SOAJ injection Use as directed for anaphylaxis (Patient not taking: Reported on 03/30/2023) 2 each 1    estradiol (ESTRACE) 0.1 MG/GM vaginal cream Insert 0.5 grams vaginally 2 (two) times a week as directed 42.5 g 6    fluconazole (DIFLUCAN) 150 MG tablet Take 1 tablet (150  mg total) by mouth as directed, may repeat 1 tablet in 3 days 2 tablet 0    fluticasone (FLONASE) 50 MCG/ACT nasal spray Place 2 sprays into both nostrils daily. 16 g 0    gabapentin (NEURONTIN) 100 MG capsule Take 1 capsule (100 mg total) by mouth 3 (three) times daily. 270 capsule 0    ibuprofen (ADVIL) 200 MG tablet Take 400 mg by mouth every 6 (six) hours as needed for moderate pain.      melatonin 5 MG TABS Take 5 mg by mouth at bedtime as needed (sleep).      montelukast (SINGULAIR) 10 MG tablet Take 1 tablet (10 mg total) by mouth daily. 30 tablet 3    Multiple Vitamin (MULTI VITAMIN) TABS 1 tablet Orally Once a day      [EXPIRED] predniSONE (DELTASONE) 20 MG tablet Take 2 tablets (40 mg total) by mouth daily with breakfast for 5 days. 10 tablet 0    Probiotic Product ([employer] PROBIOTIC PO) Take 1 capsule by mouth daily.      tamoxifen (NOLVADEX) 10 MG tablet Take 1 tablet (10 mg total) by mouth daily. 90 tablet 3    tirzepatide (MOUNJARO) 5 MG/0.5ML Pen Inject 5 mg into the skin once a week. 6 mL 0     Patient Stressors: Marital or family conflict   Medication change or noncompliance    Patient Strengths: Ability for insight  Motivation for treatment/growth   Treatment Modalities: Medication Management, Group therapy, Case management,  1 to 1 session with clinician, Psychoeducation, Recreational therapy.   Physician Treatment Plan for Primary Diagnosis: MDD (major depressive disorder), recurrent severe, without psychosis (HCC) Long Term Goal(s): Improvement in symptoms so as ready for discharge   Short Term Goals: Ability to identify changes in lifestyle to reduce recurrence of condition will improve Ability to verbalize feelings will improve Ability to identify and develop effective coping behaviors will improve Ability to identify triggers associated with substance abuse/mental health issues will improve  Medication Management: Evaluate patient's response, side effects, and  tolerance of medication regimen.  Therapeutic Interventions: 1 to 1 sessions, Unit Group sessions and Medication administration.  Evaluation of Outcomes: Progressing  Physician Treatment Plan for Secondary Diagnosis: Principal Problem:   MDD (major depressive disorder), recurrent severe, without psychosis (HCC)  Long Term Goal(s): Improvement in symptoms so as ready for discharge   Short Term Goals: Ability to identify changes in lifestyle to reduce recurrence of  condition will improve Ability to verbalize feelings will improve Ability to identify and develop effective coping behaviors will improve Ability to identify triggers associated with substance abuse/mental health issues will improve     Medication Management: Evaluate patient's response, side effects, and tolerance of medication regimen.  Therapeutic Interventions: 1 to 1 sessions, Unit Group sessions and Medication administration.  Evaluation of Outcomes: Progressing   RN Treatment Plan for Primary Diagnosis: MDD (major depressive disorder), recurrent severe, without psychosis (HCC) Long Term Goal(s): Knowledge of disease and therapeutic regimen to maintain health will improve  Short Term Goals: Ability to demonstrate self-control, Ability to participate in decision making will improve, Ability to verbalize feelings will improve, Ability to disclose and discuss suicidal ideas, Ability to identify and develop effective coping behaviors will improve, and Compliance with prescribed medications will improve  Medication Management: RN will administer medications as ordered by provider, will assess and evaluate patient's response and provide education to patient for prescribed medication. RN will report any adverse and/or side effects to prescribing provider.  Therapeutic Interventions: 1 on 1 counseling sessions, Psychoeducation, Medication administration, Evaluate responses to treatment, Monitor vital signs and CBGs as ordered,  Perform/monitor CIWA, COWS, AIMS and Fall Risk screenings as ordered, Perform wound care treatments as ordered.  Evaluation of Outcomes: Progressing   LCSW Treatment Plan for Primary Diagnosis: MDD (major depressive disorder), recurrent severe, without psychosis (HCC) Long Term Goal(s): Safe transition to appropriate next level of care at discharge, Engage patient in therapeutic group addressing interpersonal concerns.  Short Term Goals: Engage patient in aftercare planning with referrals and resources, Increase social support, Increase ability to appropriately verbalize feelings, Increase emotional regulation, Facilitate acceptance of mental health diagnosis and concerns, and Increase skills for wellness and recovery  Therapeutic Interventions: Assess for all discharge needs, 1 to 1 time with Social worker, Explore available resources and support systems, Assess for adequacy in community support network, Educate family and significant other(s) on suicide prevention, Complete Psychosocial Assessment, Interpersonal group therapy.  Evaluation of Outcomes: Progressing   Progress in Treatment: Attending groups: Yes. Participating in groups: Yes. Taking medication as prescribed: Yes. Toleration medication: Yes. Family/Significant other contact made: No, will contact:  once permission is given Patient understands diagnosis: Yes. Discussing patient identified problems/goals with staff: Yes. Medical problems stabilized or resolved: Yes. Denies suicidal/homicidal ideation: Yes. Issues/concerns per patient self-inventory: No. Other: none  New problem(s) identified: No, Describe:  none  New Short Term/Long Term Goal(s): detox, elimination of symptoms of psychosis, medication management for mood stabilization; elimination of SI thoughts; development of comprehensive mental wellness/sobriety plan.   Patient Goals:  "I'm not sure"  Discharge Plan or Barriers:  CSW to assist in the development of  appropriate discharge plans.   Reason for Continuation of Hospitalization: Anxiety Depression Medical Issues Medication stabilization Suicidal ideation  Estimated Length of Stay:  1-7 days  Last 3 [place] Suicide Severity Risk Score: Flowsheet Row Admission (Current) from 06/09/2023 in [facility] INPATIENT BE[facility]ICINE Most recent reading at 06/09/2023 10:34 PM ED from 06/09/2023 in [facility] Most recent reading at 06/09/2023  7:05 PM ED from 03/16/2023 in [facility] Emergency Department at [facility] Most recent reading at 03/16/2023  3:19 PM  C-SSRS RISK CATEGORY Moderate Risk High Risk No Risk       Last PHQ 2/9 Scores:    12/03/2021    3:53 PM 11/21/2021    2:44 PM  Depression screen PHQ 2/9  Decreased Interest 1 0  Down, Depressed,  Hopeless 1 0  PHQ - 2 Score 2 0  Altered sleeping 2   Tired, decreased energy 1   Change in appetite 1   Feeling bad or failure about yourself  0   Trouble concentrating 1   Moving slowly or fidgety/restless 0   Suicidal thoughts 0   PHQ-9 Score 7   Difficult doing work/chores Not difficult at all     Scribe for Treatment Team: Harden Mo, LCSW 06/11/2023 11:40 AM

## 2023-06-11 NOTE — Plan of Care (Signed)
  Problem: Nutrition: Goal: Adequate nutrition will be maintained Outcome: Progressing   Problem: Coping: Goal: Level of anxiety will decrease Outcome: Progressing   Problem: Pain Managment: Goal: General experience of comfort will improve Outcome: Progressing   

## 2023-06-12 DIAGNOSIS — F332 Major depressive disorder, recurrent severe without psychotic features: Secondary | ICD-10-CM | POA: Diagnosis not present

## 2023-06-12 NOTE — Progress Notes (Signed)
[facility] MD Progress Note  06/12/2023 2:11 PM Jennifer Davis  MRN:  [id number] Subjective: Jennifer Davis is seen on rounds.  She is doing so much better since I changed her Cymbalta to Effexor and added some Risperdal.  She denies any side effects.  She states that she is feeling better.  She is sleeping well and her appetite is good.  Nurses report no issues. Principal Problem: MDD (major depressive disorder), recurrent severe, without psychosis (HCC) Diagnosis: Principal Problem:   MDD (major depressive disorder), recurrent severe, without psychosis (HCC)  Total Time spent with patient: 15 minutes  Past Psychiatric History: History of depression  Past Medical History:  Past Medical History:  Diagnosis Date   Anxiety    Asthma    Asthma due to seasonal allergies    Back pain    Breast cancer (HCC)    Constipation    Depression    Diabetes mellitus without complication (HCC)    Dyspnea    after chemo - fatiqued   Fluid retention    GERD (gastroesophageal reflux disease)    occasionl   History of radiation therapy    left chest wall and subclavian 07/09/2021-08/22/2021   Dr Antony Blackbird   Joint pain    Lower extremity edema    Morbid obesity (HCC)    Palpitations 2017   tachy    Palpitations 01/09/2016   Prediabetes    PTSD (post-traumatic stress disorder)    SOB (shortness of breath)    Swallowing difficulty     Past Surgical History:  Procedure Laterality Date   BREAST RECONSTRUCTION WITH PLACEMENT OF TISSUE EXPANDER AND FLEX HD (ACELLULAR HYDRATED DERMIS) Bilateral 12/04/2020   Procedure: IMMEDIATE BILATERAL BREAST RECONSTRUCTION WITH PLACEMENT OF TISSUE EXPANDER AND FLEX HD (ACELLULAR HYDRATED DERMIS);  Surgeon: Peggye Form, DO;  Location: [facility];  Service: Plastics;  Laterality: Bilateral;   BREAST RECONSTRUCTION WITH PLACEMENT OF TISSUE EXPANDER AND FLEX HD (ACELLULAR HYDRATED DERMIS) Right 09/28/2022   Procedure: BREAST RECONSTRUCTION WITH PLACEMENT OF  TISSUE EXPANDER AND FLEX HD (ACELLULAR HYDRATED DERMIS);  Surgeon: Peggye Form, DO;  Location: [facility];  Service: Plastics;  Laterality: Right;   CESAREAN SECTION     x3   DEBRIDEMENT AND CLOSURE WOUND Right 04/10/2021   Procedure: excision of right breast wound with closure;  Surgeon: Peggye Form, DO;  Location: [facility];  Service: Plastics;  Laterality: Right;   MASTECTOMY WITH AXILLARY LYMPH NODE DISSECTION Left 12/04/2020   Procedure: BILATERAL MASTECTOMIES, RADIOACTIVE SEED GUIDED TARGETED LEFT AXILLARY NODE DISSECTION;  Surgeon: Abigail Miyamoto, MD;  Location: [facility];  Service: General;  Laterality: Left;   PORT-A-CATH REMOVAL Left 08/27/2021   Procedure: REMOVAL PORT-A-CATH;  Surgeon: Abigail Miyamoto, MD;  Location: [facility];  Service: General;  Laterality: Left;   PORTACATH PLACEMENT Left 12/04/2020   Procedure: INSERTION PORT-A-CATH LEFT SUBCLAVIAN;  Surgeon: Abigail Miyamoto, MD;  Location: [facility];  Service: General;  Laterality: Left;   REMOVAL OF TISSUE EXPANDER AND PLACEMENT OF IMPLANT Right 01/04/2021   Procedure: REMOVAL OF TISSUE EXPANDER AND PLACEMENT OF NEW EXPANDER;  Surgeon: Peggye Form, DO;  Location: [facility];  Service: Plastics;  Laterality: Right;   REMOVAL OF TISSUE EXPANDER AND PLACEMENT OF IMPLANT Left 09/28/2022   Procedure: REMOVAL OF TISSUE EXPANDER AND REPLACEMENT OF EXPANDER;  Surgeon: Peggye Form, DO;  Location: [facility];  Service: Plastics;  Laterality: Left;   REMOVAL OF TISSUE EXPANDER  AND PLACEMENT OF IMPLANT Bilateral 02/24/2023   Procedure: REMOVAL OF TISSUE EXPANDER AND PLACEMENT OF IMPLANT;  Surgeon: Peggye Form, DO;  Location: [facility];  Service: Plastics;  Laterality: Bilateral;   TISSUE EXPANDER PLACEMENT Right 03/19/2021   Procedure: Removal of right tissue expander;  Surgeon: Peggye Form, DO;  Location: [facility];  Service:  Plastics;  Laterality: Right;  45 min   Family History:  Family History  Problem Relation Age of Onset   Cancer Mother    Thyroid disease Mother    Hyperlipidemia Mother    Hypertension Mother    Fibromyalgia Mother    Colon cancer Mother 24   Depression Mother    Anxiety disorder Mother    Obesity Mother    Asthma Sister        x2   Hypertension Maternal Grandmother    Hypertension Maternal Grandfather    Cancer - Lung Maternal Grandfather    Stroke Maternal Grandfather    Insurance account manager Parkinson White syndrome Maternal Aunt 72   Colon cancer Other        MGM's niece, dx unknown age   Colon cancer Other        MGM's nephew; dx unknown age   Breast cancer Neg Hx    Family Psychiatric  History: Unremarkable Social History:  Social History   Substance and Sexual Activity  Alcohol Use Not Currently     Social History   Substance and Sexual Activity  Drug Use Never    Social History   Socioeconomic History   Marital status: Married    Spouse name: Onalee Hua   Number of children: 3   Years of education: associates   Highest education level: Not on file  Occupational History   Occupation: nursing unit clerk    Employer: [employer]  Tobacco Use   Smoking status: Former    Current packs/day: 0.00    Types: Cigarettes    Quit date: 01/09/2012    Years since quitting: 11.4   Smokeless tobacco: Never  Vaping Use   Vaping status: Never Used  Substance and Sexual Activity   Alcohol use: Not Currently   Drug use: Never   Sexual activity: Yes  Other Topics Concern   Not on file  Social History Narrative   epworth sleepiness scale = 10 (01/09/2016)   Social Determinants of Health   Financial Resource Strain: Low Risk  (11/21/2021)   Overall Financial Resource Strain (CARDIA)    Difficulty of Paying Living Expenses: Not very hard  Food Insecurity: No Food Insecurity (06/09/2023)   Hunger Vital Sign    Worried About Running Out of Food in the Last Year: Never true    Ran Out  of Food in the Last Year: Never true  Transportation Needs: No Transportation Needs (06/09/2023)   PRAPARE - Administrator, Civil Service (Medical): No    Lack of Transportation (Non-Medical): No  Physical Activity: Inactive (11/21/2021)   Exercise Vital Sign    Days of Exercise per Week: 0 days    Minutes of Exercise per Session: 0 min  Stress: No Stress Concern Present (11/21/2021)   [employer] of Occupational Health - Occupational Stress Questionnaire    Feeling of Stress : Only a little  Social Connections: Moderately Integrated (11/21/2021)   Social Connection and Isolation Panel [NHANES]    Frequency of Communication with Friends and Family: Three times a week    Frequency of Social Gatherings with Friends and Family:  Twice a week    Attends Religious Services: 1 to 4 times per year    Active Member of Clubs or Organizations: No    Attends Banker Meetings: Never    Marital Status: Married   Additional Social History:                         Sleep: Good  Appetite:  Good  Current Medications: Current Facility-Administered Medications  Medication Dose Route Frequency Provider Last Rate Last Admin   acetaminophen (TYLENOL) tablet 650 mg  650 mg Oral Q6H PRN Rankin, Shuvon B, NP       albuterol (VENTOLIN HFA) 108 (90 Base) MCG/ACT inhaler 2 puff  2 puff Inhalation Q4H PRN Rankin, Shuvon B, NP       alum & mag hydroxide-simeth (MAALOX/MYLANTA) 200-200-20 MG/5ML suspension 30 mL  30 mL Oral Q4H PRN Rankin, Shuvon B, NP       busPIRone (BUSPAR) tablet 10 mg  10 mg Oral BID Sarina Ill, DO   10 mg at 06/12/23 0840   cholecalciferol (VITAMIN D3) 25 MCG (1000 UNIT) tablet 2,000 Units  2,000 Units Oral Daily Rankin, Shuvon B, NP   2,000 Units at 06/12/23 0840   diphenhydrAMINE (BENADRYL) capsule 50 mg  50 mg Oral TID PRN Rankin, Shuvon B, NP   50 mg at 06/12/23 8657   Or   diphenhydrAMINE (BENADRYL) injection 50 mg  50 mg  Intramuscular TID PRN Rankin, Shuvon B, NP       EPINEPHrine (EPI-PEN) injection 0.3 mg  0.3 mg Intramuscular Once PRN Rankin, Shuvon B, NP       estradiol (ESTRACE) vaginal cream 0.5 g  0.5 g Vaginal Once per day on Monday Thursday Lauree Chandler, NP   0.5 g at 06/10/23 2112   gabapentin (NEURONTIN) capsule 100 mg  100 mg Oral TID Sarina Ill, DO   100 mg at 06/12/23 0843   loratadine (CLARITIN) tablet 10 mg  10 mg Oral Daily Rankin, Shuvon B, NP   10 mg at 06/12/23 0840   magnesium hydroxide (MILK OF MAGNESIA) suspension 30 mL  30 mL Oral Daily PRN Rankin, Shuvon B, NP   30 mL at 06/11/23 1339   melatonin tablet 5 mg  5 mg Oral QHS PRN Lauree Chandler, NP   5 mg at 06/11/23 2125   mometasone-formoterol (DULERA) 200-5 MCG/ACT inhaler 2 puff  2 puff Inhalation BID Rankin, Shuvon B, NP   2 puff at 06/12/23 0839   montelukast (SINGULAIR) tablet 10 mg  10 mg Oral Daily Rankin, Shuvon B, NP   10 mg at 06/12/23 0841   multivitamin with minerals tablet 1 tablet  1 tablet Oral Q1200 Rankin, Shuvon B, NP   1 tablet at 06/12/23 1205   risperiDONE (RISPERDAL) tablet 0.5 mg  0.5 mg Oral [id number] Sarina Ill, DO   0.5 mg at 06/12/23 0840   tamoxifen (NOLVADEX) tablet 10 mg  10 mg Oral Daily Rankin, Shuvon B, NP   10 mg at 06/12/23 0841   venlafaxine XR (EFFEXOR-XR) 24 hr capsule 150 mg  150 mg Oral Q breakfast Sarina Ill, DO   150 mg at 06/12/23 0840    Lab Results: No results found for this or any previous visit (from the past 48 hour(s)).  Blood Alcohol level:  Lab Results  Component Value Date   ETH <10 06/09/2023    Metabolic Disorder Labs: Lab Results  Component  Value Date   HGBA1C 5.8 (H) 06/09/2023   MPG 119.76 06/09/2023   MPG 108.28 09/22/2022   Lab Results  Component Value Date   PROLACTIN 8.4 06/09/2023   Lab Results  Component Value Date   CHOL 240 (H) 06/09/2023   TRIG 119 06/09/2023   HDL 75 06/09/2023   CHOLHDL 3.2 06/09/2023    VLDL 24 06/09/2023   LDLCALC 141 (H) 06/09/2023   LDLCALC 132 (H) 12/03/2021    Physical Findings: AIMS:  , ,  ,  ,    CIWA:    COWS:     Musculoskeletal: Strength & Muscle Tone: within normal limits Gait & Station: normal Patient leans: N/A  Psychiatric Specialty Exam:  Presentation  General Appearance:  Appropriate for Environment  Eye Contact: Good  Speech: Clear and Coherent; Normal Rate; Other (comment) (some hoarseness)  Speech Volume: Normal  Handedness: Right   Mood and Affect  Mood: Depressed; Anxious  Affect: Tearful; Blunt   Thought Process  Thought Processes: Coherent; Goal Directed; Linear  Descriptions of Associations:Intact  Orientation:Full (Time, Place and Person)  Thought Content:Logical  History of Schizophrenia/Schizoaffective disorder:No  Duration of Psychotic Symptoms:No data recorded Hallucinations:No data recorded Ideas of Reference:None  Suicidal Thoughts:No data recorded Homicidal Thoughts:No data recorded  Sensorium  Memory: Immediate Good  Judgment: Fair  Insight: Fair   Art therapist  Concentration: Good  Attention Span: Good  Recall: Good  Fund of Knowledge: Good  Language: Good   Psychomotor Activity  Psychomotor Activity:No data recorded  Assets  Assets: Communication Skills; Desire for Improvement; Financial Resources/Insurance; Housing; Leisure Time; Physical Health; Resilience; Social Support; Transportation; Vocational/Educational   Sleep  Sleep:No data recorded    Blood pressure 114/72, pulse 87, temperature 97.9 F (36.6 C), temperature source Oral, resp. rate 19, height 4\' 11"  (1.499 m), weight 71.4 kg, last menstrual period 12/10/2020, SpO2 100%. Body mass index is 31.79 kg/m.   Treatment Plan Summary: Daily contact with patient to assess and evaluate symptoms and progress in treatment, Medication management, and Plan continue current medications.  Sarina Ill, DO 06/12/2023, 2:11 PM

## 2023-06-12 NOTE — Group Note (Signed)
Date:  06/12/2023 Time:  1:28 PM  Group Topic/Focus:  Goals Group:   The focus of this group is to help patients establish daily goals to achieve during treatment and discuss how the patient can incorporate goal setting into their daily lives to aide in recovery.    Participation Level:  Active  Participation Quality:  Appropriate  Affect:  Appropriate  Cognitive:  Appropriate  Insight: Appropriate  Engagement in Group:  Engaged  Modes of Intervention:  Activity, Discussion, and Education  Additional Comments:    Wilford Corner 06/12/2023, 1:28 PM

## 2023-06-12 NOTE — Progress Notes (Signed)
D- Patient alert and oriented x 4. Affect bright/mood congruent. Denies SI/ HI/ AVH. Patient denies pain. Patient endorses  some depression and anxiety. Her goals today are to work on her possible treatment plan and speak with the care. Instructed on medications- actions, doses and adverse reaction. A- Scheduled medications administered to patient, per MD orders. Support and encouragement provided.  Routine safety checks conducted every 15 minutes without incident.  Patient informed to notify staff with problems or concerns and verbalizes understanding. R- No adverse drug reactions noted.  Patient compliant with medications and treatment plan. Patient receptive, calm, cooperative and interacts well with others on the unit.  Patient contracts for safety and  remains safe on the unit at this time.

## 2023-06-12 NOTE — Group Note (Unsigned)
Date:  06/12/2023 Time:  8:53 PM  Group Topic/Focus:  Dimensions of Wellness:   The focus of this group is to introduce the topic of wellness and discuss the role each dimension of wellness plays in total health.     Participation Level:  {BHH PARTICIPATION NWGNF:62130}  Participation Quality:  {BHH PARTICIPATION QUALITY:22265}  Affect:  {BHH AFFECT:22266}  Cognitive:  {BHH COGNITIVE:22267}  Insight: {BHH Insight2:20797}  Engagement in Group:  {BHH ENGAGEMENT IN QMVHQ:46962}  Modes of Intervention:  {BHH MODES OF INTERVENTION:22269}  Additional Comments:  ***    06/12/2023, 8:53 PM

## 2023-06-12 NOTE — Group Note (Signed)
Date:  06/12/2023 Time:  1:42 AM  Group Topic/Focus:  Goals Group:   The focus of this group is to help patients establish daily goals to achieve during treatment and discuss how the patient can incorporate goal setting into their daily lives to aide in recovery.    Participation Level:  Active  Participation Quality:  Appropriate  Affect:  Appropriate  Cognitive:  Appropriate  Insight: Good  Engagement in Group:  Engaged  Modes of Intervention:  Discussion  Additional Comments:    Lenore Cordia 06/12/2023, 1:42 AM

## 2023-06-12 NOTE — Group Note (Unsigned)
Date:  06/12/2023 Time:  9:57 PM  Group Topic/Focus:  Dimensions of Wellness:   The focus of this group is to introduce the topic of wellness and discuss the role each dimension of wellness plays in total health.     Participation Level:  {BHH PARTICIPATION JXBJY:78295}  Participation Quality:  {BHH PARTICIPATION QUALITY:22265}  Affect:  {BHH AFFECT:22266}  Cognitive:  {BHH COGNITIVE:22267}  Insight: {BHH Insight2:20797}  Engagement in Group:  {BHH ENGAGEMENT IN AOZHY:86578}  Modes of Intervention:  {BHH MODES OF INTERVENTION:22269}  Additional Comments:  ***    06/12/2023, 9:57 PM

## 2023-06-12 NOTE — Plan of Care (Signed)
  Problem: Health Behavior/Discharge Planning: Goal: Ability to manage health-related needs will improve Outcome: Progressing   

## 2023-06-12 NOTE — Group Note (Addendum)
LCSW Group Therapy Note  Group Date: 06/12/2023 Start Time: 1310 End Time: 1400   Type of Therapy and Topic:  Group Therapy - Coping Skills  Participation Level:  Active   Description of Group The focus of this group was to determine what unhealthy coping techniques typically are used by group members and what healthy coping techniques would be helpful in coping with various problems. Patients were guided in becoming aware of the differences between healthy and unhealthy coping techniques. Patients were asked to identify 2-3 healthy coping skills they would like to learn to use more effectively.  Therapeutic Goals Patients learned that coping is what human beings do all day long to deal with various situations in their lives Patients defined and discussed healthy vs unhealthy coping techniques Patients identified their preferred coping techniques and identified whether these were healthy or unhealthy Patients determined 2-3 healthy coping skills they would like to become more familiar with and use more often. Patients provided support and ideas to each other   Summary of Patient Progress: The patient attended group. The patient participated during the icebreaker. Patient proved open to input from peers and feedback from [facility]. Patient demonstrated insight into the subject matter, was respectful of peers, and participated throughout the entire session.     Marshell Levan, LCSWA 06/12/2023  2:33 PM

## 2023-06-12 NOTE — Progress Notes (Signed)
Patient receptive to staff no distress noted, affect is flat but brightens upon approach, denies SI/HI/AVH interacting appropriately with peers and staff, complaint with medication regimen, 15 minutes safety checks maintained will continue to monitor

## 2023-06-12 NOTE — Group Note (Signed)
Date:  06/12/2023 Time:  10:55 PM  Group Topic/Focus:  Dimensions of Wellness:   The focus of this group is to introduce the topic of wellness and discuss the role each dimension of wellness plays in total health.    Participation Level:  Active  Participation Quality:  Appropriate and Attentive  Affect:  Appropriate and Excited  Cognitive:  Alert and Appropriate  Insight: Appropriate  Engagement in Group:  Developing/Improving and Engaged  Modes of Intervention:  Discussion, Education, Problem-solving, Rapport Building, Socialization, and Support  Additional Comments:       06/12/2023, 10:55 PM

## 2023-06-13 DIAGNOSIS — F332 Major depressive disorder, recurrent severe without psychotic features: Secondary | ICD-10-CM | POA: Diagnosis not present

## 2023-06-13 NOTE — Plan of Care (Signed)
  Problem: Education: Goal: Knowledge of General Education information will improve Description: Including pain rating scale, medication(s)/side effects and non-pharmacologic comfort measures Outcome: Progressing   Problem: Clinical Measurements: Goal: Will remain free from infection Outcome: Progressing   

## 2023-06-13 NOTE — Group Note (Signed)
Date:  06/13/2023 Time:  9:46 PM  Group Topic/Focus:   Wrap-Up Group:   The focus of this group is to help patients review their daily goal of treatment and discuss progress on daily workbooks.    Participation Level:  Active  Participation Quality:  Appropriate and Attentive  Affect:  Appropriate  Cognitive:  Appropriate  Insight: Appropriate and Good  Engagement in Group:  Supportive  Modes of Intervention:  Support  Additional Comments:     Belva Crome 06/13/2023, 9:46 PM

## 2023-06-13 NOTE — Progress Notes (Signed)
Patient alert and oriented x 4, no distress noted, affect is flat but brightens upon approach, denies SI/HI/AVH interacting appropriately with peers and staff, complaint with medication regimen, offered emotional support and encouragement. 15 minutes safety checks maintained will continue to [facility]

## 2023-06-13 NOTE — BHH Suicide Risk Assessment (Signed)
BHH INPATIENT:  Family/Significant Other Suicide Prevention Education  Suicide Prevention Education:  Education Completed; Paige Dewilde(daughter) [phone],  has been identified by the patient as the family member/significant other with whom the patient will be residing, and identified as the person(s) who will aid the patient in the event of a mental health crisis (suicidal ideations/suicide attempt).  With written consent from the patient, the family member/significant other has been provided the following suicide prevention education, prior to the and/or following the discharge of the patient.  The suicide prevention education provided includes the following: Suicide risk factors Suicide prevention and interventions National Suicide Hotline telephone number [facility] assessment telephone number [facility] Emergency Assistance 911 [facility] and/or Residential Mobile Crisis Unit telephone number  Request made of family/significant other to: Remove weapons (e.g., guns, rifles, knives), all items previously/currently identified as safety concern.   Remove drugs/medications (over-the-counter, prescriptions, illicit drugs), all items previously/currently identified as a safety concern.  The family member/significant other verbalizes understanding of the suicide prevention education information provided.  The family member/significant other agrees to remove the items of safety concern listed above.  The LCSWA contacted the patients daughter for the Suicide Prevention Education. The patients daughter stated that the patient didn't plan to harm herself it was spontaneous. She stated that her aunt noticed her strange behavior. The LCSWA went over signs and what to do with the patients daughter. She stated that their were no guns in the home and that they have put away sharp objects and medication. She stated they have been through it before so they know what to do.   Marshell Levan 06/13/2023, 4:17 PM

## 2023-06-13 NOTE — Progress Notes (Signed)
Pt reports good sleep and good appetite.  Pt reports low energy and good concentration.  Depression and hopelessness rated as 4, anxiety rated at 3.  Pt denies SI.  Pt denies physical problems or pain.  Pt plans to work on creating a plan for after discharge and thinking about resources that are available.  Continued monitoring for safety.    06/13/23 1700  Psych Admission Type (Psych Patients Only)  Admission Status Voluntary  Psychosocial Assessment  Patient Complaints Anxiety  Eye Contact Fair  Facial Expression Anxious  Affect Depressed  Speech Logical/coherent  Interaction Assertive  Motor Activity Slow  Appearance/Hygiene Unremarkable  Behavior Characteristics Cooperative  Mood Euthymic  Thought Process  Coherency WDL  Content WDL  Delusions None reported or observed  Perception WDL  Hallucination None reported or observed  Judgment WDL  Confusion None  Danger to Self  Current suicidal ideation? Denies  Danger to Others  Danger to Others None reported or observed

## 2023-06-13 NOTE — Progress Notes (Signed)
[facility] MD Progress Note  06/13/2023 11:52 AM RAQUELL RICHER  MRN:  [id number] Subjective: Jennifer Davis is seen on rounds.  She has been doing well since changing her medications over to Effexor and Risperdal.  She denies any side effects from her medications.  Nurses report no issues.  She is eating and sleeping well and can probably go home tomorrow. Principal Problem: MDD (major depressive disorder), recurrent severe, without psychosis (HCC) Diagnosis: Principal Problem:   MDD (major depressive disorder), recurrent severe, without psychosis (HCC)  Total Time spent with patient: 15 minutes  Past Psychiatric History: Depression  Past Medical History:  Past Medical History:  Diagnosis Date   Anxiety    Asthma    Asthma due to seasonal allergies    Back pain    Breast cancer (HCC)    Constipation    Depression    Diabetes mellitus without complication (HCC)    Dyspnea    after chemo - fatiqued   Fluid retention    GERD (gastroesophageal reflux disease)    occasionl   History of radiation therapy    left chest wall and subclavian 07/09/2021-08/22/2021   Dr Antony Blackbird   Joint pain    Lower extremity edema    Morbid obesity (HCC)    Palpitations 2017   tachy    Palpitations 01/09/2016   Prediabetes    PTSD (post-traumatic stress disorder)    SOB (shortness of breath)    Swallowing difficulty     Past Surgical History:  Procedure Laterality Date   BREAST RECONSTRUCTION WITH PLACEMENT OF TISSUE EXPANDER AND FLEX HD (ACELLULAR HYDRATED DERMIS) Bilateral 12/04/2020   Procedure: IMMEDIATE BILATERAL BREAST RECONSTRUCTION WITH PLACEMENT OF TISSUE EXPANDER AND FLEX HD (ACELLULAR HYDRATED DERMIS);  Surgeon: Peggye Form, DO;  Location: [facility];  Service: Plastics;  Laterality: Bilateral;   BREAST RECONSTRUCTION WITH PLACEMENT OF TISSUE EXPANDER AND FLEX HD (ACELLULAR HYDRATED DERMIS) Right 09/28/2022   Procedure: BREAST RECONSTRUCTION WITH PLACEMENT OF TISSUE EXPANDER  AND FLEX HD (ACELLULAR HYDRATED DERMIS);  Surgeon: Peggye Form, DO;  Location: [facility];  Service: Plastics;  Laterality: Right;   CESAREAN SECTION     x3   DEBRIDEMENT AND CLOSURE WOUND Right 04/10/2021   Procedure: excision of right breast wound with closure;  Surgeon: Peggye Form, DO;  Location: [facility];  Service: Plastics;  Laterality: Right;   MASTECTOMY WITH AXILLARY LYMPH NODE DISSECTION Left 12/04/2020   Procedure: BILATERAL MASTECTOMIES, RADIOACTIVE SEED GUIDED TARGETED LEFT AXILLARY NODE DISSECTION;  Surgeon: Abigail Miyamoto, MD;  Location: [facility];  Service: General;  Laterality: Left;   PORT-A-CATH REMOVAL Left 08/27/2021   Procedure: REMOVAL PORT-A-CATH;  Surgeon: Abigail Miyamoto, MD;  Location: [facility];  Service: General;  Laterality: Left;   PORTACATH PLACEMENT Left 12/04/2020   Procedure: INSERTION PORT-A-CATH LEFT SUBCLAVIAN;  Surgeon: Abigail Miyamoto, MD;  Location:  SURGERY CENTER;  Service: General;  Laterality: Left;   REMOVAL OF TISSUE EXPANDER AND PLACEMENT OF IMPLANT Right 01/04/2021   Procedure: REMOVAL OF TISSUE EXPANDER AND PLACEMENT OF NEW EXPANDER;  Surgeon: Peggye Form, DO;  Location: [facility];  Service: Plastics;  Laterality: Right;   REMOVAL OF TISSUE EXPANDER AND PLACEMENT OF IMPLANT Left 09/28/2022   Procedure: REMOVAL OF TISSUE EXPANDER AND REPLACEMENT OF EXPANDER;  Surgeon: Peggye Form, DO;  Location: [facility];  Service: Plastics;  Laterality: Left;   REMOVAL OF TISSUE EXPANDER AND PLACEMENT OF IMPLANT Bilateral 02/24/2023  Procedure: REMOVAL OF TISSUE EXPANDER AND PLACEMENT OF IMPLANT;  Surgeon: Peggye Form, DO;  Location: [facility];  Service: Plastics;  Laterality: Bilateral;   TISSUE EXPANDER PLACEMENT Right 03/19/2021   Procedure: Removal of right tissue expander;  Surgeon: Peggye Form, DO;  Location: [facility];  Service: Plastics;   Laterality: Right;  45 min   Family History:  Family History  Problem Relation Age of Onset   Cancer Mother    Thyroid disease Mother    Hyperlipidemia Mother    Hypertension Mother    Fibromyalgia Mother    Colon cancer Mother 20   Depression Mother    Anxiety disorder Mother    Obesity Mother    Asthma Sister        x2   Hypertension Maternal Grandmother    Hypertension Maternal Grandfather    Cancer - Lung Maternal Grandfather    Stroke Maternal Grandfather    Insurance account manager Parkinson White syndrome Maternal Aunt 87   Colon cancer Other        MGM's niece, dx unknown age   Colon cancer Other        MGM's nephew; dx unknown age   Breast cancer Neg Hx    Family Psychiatric  History: Unremarkable Social History:  Social History   Substance and Sexual Activity  Alcohol Use Not Currently     Social History   Substance and Sexual Activity  Drug Use Never    Social History   Socioeconomic History   Marital status: Married    Spouse name: Onalee Hua   Number of children: 3   Years of education: associates   Highest education level: Not on file  Occupational History   Occupation: nursing unit clerk    Employer: [employer]  Tobacco Use   Smoking status: Former    Current packs/day: 0.00    Types: Cigarettes    Quit date: 01/09/2012    Years since quitting: 11.4   Smokeless tobacco: Never  Vaping Use   Vaping status: Never Used  Substance and Sexual Activity   Alcohol use: Not Currently   Drug use: Never   Sexual activity: Yes  Other Topics Concern   Not on file  Social History Narrative   epworth sleepiness scale = 10 (01/09/2016)   Social Determinants of Health   Financial Resource Strain: Low Risk  (11/21/2021)   Overall Financial Resource Strain (CARDIA)    Difficulty of Paying Living Expenses: Not very hard  Food Insecurity: No Food Insecurity (06/09/2023)   Hunger Vital Sign    Worried About Running Out of Food in the Last Year: Never true    Ran Out of Food in  the Last Year: Never true  Transportation Needs: No Transportation Needs (06/09/2023)   PRAPARE - Administrator, Civil Service (Medical): No    Lack of Transportation (Non-Medical): No  Physical Activity: Inactive (11/21/2021)   Exercise Vital Sign    Days of Exercise per Week: 0 days    Minutes of Exercise per Session: 0 min  Stress: No Stress Concern Present (11/21/2021)   [employer] of Occupational Health - Occupational Stress Questionnaire    Feeling of Stress : Only a little  Social Connections: Moderately Integrated (11/21/2021)   Social Connection and Isolation Panel [NHANES]    Frequency of Communication with Friends and Family: Three times a week    Frequency of Social Gatherings with Friends and Family: Twice a week    Attends Religious  Services: 1 to 4 times per year    Active Member of Clubs or Organizations: No    Attends Banker Meetings: Never    Marital Status: Married   Additional Social History:                         Sleep: Good  Appetite:  Good  Current Medications: Current Facility-Administered Medications  Medication Dose Route Frequency Provider Last Rate Last Admin   acetaminophen (TYLENOL) tablet 650 mg  650 mg Oral Q6H PRN Rankin, Shuvon B, NP   650 mg at 06/12/23 2108   albuterol (VENTOLIN HFA) 108 (90 Base) MCG/ACT inhaler 2 puff  2 puff Inhalation Q4H PRN Rankin, Shuvon B, NP       alum & mag hydroxide-simeth (MAALOX/MYLANTA) 200-200-20 MG/5ML suspension 30 mL  30 mL Oral Q4H PRN Rankin, Shuvon B, NP       busPIRone (BUSPAR) tablet 10 mg  10 mg Oral BID Sarina Ill, DO   10 mg at 06/13/23 8295   cholecalciferol (VITAMIN D3) 25 MCG (1000 UNIT) tablet 2,000 Units  2,000 Units Oral Daily Rankin, Shuvon B, NP   2,000 Units at 06/13/23 6213   diphenhydrAMINE (BENADRYL) capsule 50 mg  50 mg Oral TID PRN Rankin, Shuvon B, NP   50 mg at 06/12/23 0865   Or   diphenhydrAMINE (BENADRYL) injection 50 mg  50 mg  Intramuscular TID PRN Rankin, Shuvon B, NP       EPINEPHrine (EPI-PEN) injection 0.3 mg  0.3 mg Intramuscular Once PRN Rankin, Shuvon B, NP       estradiol (ESTRACE) vaginal cream 0.5 g  0.5 g Vaginal Once per day on Monday Thursday Lauree Chandler, NP   0.5 g at 06/10/23 2112   gabapentin (NEURONTIN) capsule 100 mg  100 mg Oral TID Sarina Ill, DO   100 mg at 06/13/23 1100   loratadine (CLARITIN) tablet 10 mg  10 mg Oral Daily Rankin, Shuvon B, NP   10 mg at 06/13/23 7846   magnesium hydroxide (MILK OF MAGNESIA) suspension 30 mL  30 mL Oral Daily PRN Rankin, Shuvon B, NP   30 mL at 06/11/23 1339   melatonin tablet 5 mg  5 mg Oral QHS PRN Lauree Chandler, NP   5 mg at 06/11/23 2125   mometasone-formoterol (DULERA) 200-5 MCG/ACT inhaler 2 puff  2 puff Inhalation BID Rankin, Shuvon B, NP   2 puff at 06/13/23 0821   montelukast (SINGULAIR) tablet 10 mg  10 mg Oral Daily Rankin, Shuvon B, NP   10 mg at 06/13/23 0823   multivitamin with minerals tablet 1 tablet  1 tablet Oral Q1200 Rankin, Shuvon B, NP   1 tablet at 06/13/23 1102   risperiDONE (RISPERDAL) tablet 0.5 mg  0.5 mg Oral [id number] Sarina Ill, DO   0.5 mg at 06/13/23 9629   tamoxifen (NOLVADEX) tablet 10 mg  10 mg Oral Daily Rankin, Shuvon B, NP   10 mg at 06/13/23 5284   venlafaxine XR (EFFEXOR-XR) 24 hr capsule 150 mg  150 mg Oral Q breakfast Sarina Ill, DO   150 mg at 06/13/23 1324    Lab Results: No results found for this or any previous visit (from the past 48 hour(s)).  Blood Alcohol level:  Lab Results  Component Value Date   ETH <10 06/09/2023    Metabolic Disorder Labs: Lab Results  Component Value Date   HGBA1C  5.8 (H) 06/09/2023   MPG 119.76 06/09/2023   MPG 108.28 09/22/2022   Lab Results  Component Value Date   PROLACTIN 8.4 06/09/2023   Lab Results  Component Value Date   CHOL 240 (H) 06/09/2023   TRIG 119 06/09/2023   HDL 75 06/09/2023   CHOLHDL 3.2 06/09/2023    VLDL 24 06/09/2023   LDLCALC 141 (H) 06/09/2023   LDLCALC 132 (H) 12/03/2021    Physical Findings: AIMS:  , ,  ,  ,    CIWA:    COWS:     Musculoskeletal: Strength & Muscle Tone: within normal limits Gait & Station: normal Patient leans: N/A  Psychiatric Specialty Exam:  Presentation  General Appearance:  Appropriate for Environment  Eye Contact: Good  Speech: Clear and Coherent; Normal Rate; Other (comment) (some hoarseness)  Speech Volume: Normal  Handedness: Right   Mood and Affect  Mood: Depressed; Anxious  Affect: Tearful; Blunt   Thought Process  Thought Processes: Coherent; Goal Directed; Linear  Descriptions of Associations:Intact  Orientation:Full (Time, Place and Person)  Thought Content:Logical  History of Schizophrenia/Schizoaffective disorder:No  Duration of Psychotic Symptoms:No data recorded Hallucinations:No data recorded Ideas of Reference:None  Suicidal Thoughts:No data recorded Homicidal Thoughts:No data recorded  Sensorium  Memory: Immediate Good  Judgment: Fair  Insight: Fair   Art therapist  Concentration: Good  Attention Span: Good  Recall: Good  Fund of Knowledge: Good  Language: Good   Psychomotor Activity  Psychomotor Activity:No data recorded  Assets  Assets: Communication Skills; Desire for Improvement; Financial Resources/Insurance; Housing; Leisure Time; Physical Health; Resilience; Social Support; Transportation; Vocational/Educational   Sleep  Sleep:No data recorded    Blood pressure 122/84, pulse 92, temperature 97.7 F (36.5 C), resp. rate 18, height 4\' 11"  (1.499 m), weight 71.4 kg, last menstrual period 12/10/2020, SpO2 100%. Body mass index is 31.79 kg/m.   Treatment Plan Summary: Daily contact with patient to assess and evaluate symptoms and progress in treatment, Medication management, and Plan continue current medications.  Sarina Ill, DO 06/13/2023,  11:52 AM

## 2023-06-13 NOTE — Group Note (Signed)
[facility] LCSW Group Therapy Note   Group Date: 06/13/2023 Start Time: 1300 End Time: 1400   Type of Therapy/Topic:  Group Therapy:  Balance in Life  Participation Level:  Active   Description of Group:    This group will address the concept of balance and how it feels and looks when one is unbalanced. Patients will be encouraged to process areas in their lives that are out of balance, and identify reasons for remaining unbalanced. Facilitators will guide patients utilizing problem- solving interventions to address and correct the stressor making their life unbalanced. Understanding and applying boundaries will be explored and addressed for obtaining  and maintaining a balanced life. Patients will be encouraged to explore ways to assertively make their unbalanced needs known to significant others in their lives, using other group members and facilitator for support and feedback.  Therapeutic Goals: Patient will identify two or more emotions or situations they have that consume much of in their lives. Patient will identify signs/triggers that life has become out of balance:  Patient will identify two ways to set boundaries in order to achieve balance in their lives:  Patient will demonstrate ability to communicate their needs through discussion and/or role plays  Summary of Patient Progress:  The patient attended Group.  Patient proved open to input from peers and feedback from [facility]. Patient demonstrated insight into the subject matter, was respectful of peers, and participated throughout the entire session. The patient participated during the icebreaker. The patient stated that her husband is something she can do with out.      Marshell Levan, LCSW

## 2023-06-14 ENCOUNTER — Other Ambulatory Visit (HOSPITAL_COMMUNITY): Payer: Self-pay

## 2023-06-14 MED ORDER — BUSPIRONE HCL 10 MG PO TABS
10.0000 mg | ORAL_TABLET | Freq: Two times a day (BID) | ORAL | 0 refills | Status: DC
  Filled 2023-06-14: qty 60, 30d supply, fill #0

## 2023-06-14 MED ORDER — VENLAFAXINE HCL ER 150 MG PO CP24
150.0000 mg | ORAL_CAPSULE | Freq: Every day | ORAL | 0 refills | Status: DC
  Filled 2023-06-14: qty 30, 30d supply, fill #0

## 2023-06-14 NOTE — Discharge Summary (Signed)
Physician Discharge Summary Note  Patient:  Jennifer Davis is an 47 y.o., female MRN:  [id number] DOB:  1975/11/19 Patient phone:  [phone] (home)  Patient address:   [address] [place] [address],  Total Time spent with patient: 30 minutes  Date of Admission:  06/09/2023 Date of Discharge: 06/14/2023  Reason for Admission:  47 y/o female w/ history of ptsd, mdd admitted for suicidal ideation with plan and intent.  Principal Problem: MDD (major depressive disorder), recurrent severe, without psychosis (HCC) Discharge Diagnoses: Principal Problem:   MDD (major depressive disorder), recurrent severe, without psychosis (HCC)  Subjective: Pt chart reviewed and seen on rounds. Reports euthymic mood today. Affect is bright, full range. Denies suicidal, homicidal ideations. Denies auditory visual hallucinations or paranoia. Feels ready for discharge. Is established with [facility] for psychiatry and [facility] for individual therapy. Next appointment with [facility] is tomorrow. Pt is also connected with marriage counseling with Dr. Johnna Acosta. Pt gave verbal consent for her sister and daughter to be reached. Attempted to reach pt's sister, Jennifer Davis, [phone], went to voicemail. Spoke w/ pt's daughter, Jennifer Davis, [phone], with pt present. Pt's daughter denies safety concerns with discharge today. Safety planning completed including: Frequent conversations regarding unsafe thoughts. Locking/monitoring the use of all significant sharps, including knives, razor blades, pencil sharpener razors. If there is a firearm in the home, keeping the firearm unloaded, locking the firearm, locking the ammunition separately from the firearm, preventing access to the firearm and the ammunition. Locking/monitoring the use of medications, including over-the-counter medications and supplements. Having a responsible person dispense medications until patient has strengthened coping skills. Room  checks for sharps or other harmful objects. Secure all chemical substances that can be ingested or inhaled. Securing any ligature risks. Calling 911/EMS or going to the nearest emergency room for any worsening of condition.  Past Psychiatric History: MDD, PTSD  Past Medical History:  Past Medical History:  Diagnosis Date   Anxiety    Asthma    Asthma due to seasonal allergies    Back pain    Breast cancer (HCC)    Constipation    Depression    Diabetes mellitus without complication (HCC)    Dyspnea    after chemo - fatiqued   Fluid retention    GERD (gastroesophageal reflux disease)    occasionl   History of radiation therapy    left chest wall and subclavian 07/09/2021-08/22/2021   Dr Antony Blackbird   Joint pain    Lower extremity edema    Morbid obesity (HCC)    Palpitations 2017   tachy    Palpitations 01/09/2016   Prediabetes    PTSD (post-traumatic stress disorder)    SOB (shortness of breath)    Swallowing difficulty     Past Surgical History:  Procedure Laterality Date   BREAST RECONSTRUCTION WITH PLACEMENT OF TISSUE EXPANDER AND FLEX HD (ACELLULAR HYDRATED DERMIS) Bilateral 12/04/2020   Procedure: IMMEDIATE BILATERAL BREAST RECONSTRUCTION WITH PLACEMENT OF TISSUE EXPANDER AND FLEX HD (ACELLULAR HYDRATED DERMIS);  Surgeon: Peggye Form, DO;  Location: [facility];  Service: Plastics;  Laterality: Bilateral;   BREAST RECONSTRUCTION WITH PLACEMENT OF TISSUE EXPANDER AND FLEX HD (ACELLULAR HYDRATED DERMIS) Right 09/28/2022   Procedure: BREAST RECONSTRUCTION WITH PLACEMENT OF TISSUE EXPANDER AND FLEX HD (ACELLULAR HYDRATED DERMIS);  Surgeon: Peggye Form, DO;  Location: [facility];  Service: Plastics;  Laterality: Right;   CESAREAN SECTION     x3   DEBRIDEMENT AND  CLOSURE WOUND Right 04/10/2021   Procedure: excision of right breast wound with closure;  Surgeon: Peggye Form, DO;  Location: [facility];  Service: Plastics;   Laterality: Right;   MASTECTOMY WITH AXILLARY LYMPH NODE DISSECTION Left 12/04/2020   Procedure: BILATERAL MASTECTOMIES, RADIOACTIVE SEED GUIDED TARGETED LEFT AXILLARY NODE DISSECTION;  Surgeon: Abigail Miyamoto, MD;  Location: [facility];  Service: General;  Laterality: Left;   PORT-A-CATH REMOVAL Left 08/27/2021   Procedure: REMOVAL PORT-A-CATH;  Surgeon: Abigail Miyamoto, MD;  Location: [facility];  Service: General;  Laterality: Left;   PORTACATH PLACEMENT Left 12/04/2020   Procedure: INSERTION PORT-A-CATH LEFT SUBCLAVIAN;  Surgeon: Abigail Miyamoto, MD;  Location: [facility];  Service: General;  Laterality: Left;   REMOVAL OF TISSUE EXPANDER AND PLACEMENT OF IMPLANT Right 01/04/2021   Procedure: REMOVAL OF TISSUE EXPANDER AND PLACEMENT OF NEW EXPANDER;  Surgeon: Peggye Form, DO;  Location: [facility];  Service: Plastics;  Laterality: Right;   REMOVAL OF TISSUE EXPANDER AND PLACEMENT OF IMPLANT Left 09/28/2022   Procedure: REMOVAL OF TISSUE EXPANDER AND REPLACEMENT OF EXPANDER;  Surgeon: Peggye Form, DO;  Location: [facility];  Service: Plastics;  Laterality: Left;   REMOVAL OF TISSUE EXPANDER AND PLACEMENT OF IMPLANT Bilateral 02/24/2023   Procedure: REMOVAL OF TISSUE EXPANDER AND PLACEMENT OF IMPLANT;  Surgeon: Peggye Form, DO;  Location:  SUR[facility];  Service: Plastics;  Laterality: Bilateral;   TISSUE EXPANDER PLACEMENT Right 03/19/2021   Procedure: Removal of right tissue expander;  Surgeon: Peggye Form, DO;  Location: [facility];  Service: Plastics;  Laterality: Right;  45 min   Family History:  Family History  Problem Relation Age of Onset   Cancer Mother    Thyroid disease Mother    Hyperlipidemia Mother    Hypertension Mother    Fibromyalgia Mother    Colon cancer Mother 71   Depression Mother    Anxiety disorder Mother    Obesity Mother    Asthma Sister        x2   Hypertension Maternal  Grandmother    Hypertension Maternal Grandfather    Cancer - Lung Maternal Grandfather    Stroke Maternal Grandfather    Evelene Croon Parkinson White syndrome Maternal Aunt 43   Colon cancer Other        MGM's niece, dx unknown age   Colon cancer Other        MGM's nephew; dx unknown age   Breast cancer Neg Hx    Family Psychiatric  History: Mother with depression, anxiety Social History:  Social History   Substance and Sexual Activity  Alcohol Use Not Currently     Social History   Substance and Sexual Activity  Drug Use Never    Social History   Socioeconomic History   Marital status: Married    Spouse name: Onalee Hua   Number of children: 3   Years of education: associates   Highest education level: Not on file  Occupational History   Occupation: nursing unit clerk    Employer: [employer]  Tobacco Use   Smoking status: Former    Current packs/day: 0.00    Types: Cigarettes    Quit date: 01/09/2012    Years since quitting: 11.4   Smokeless tobacco: Never  Vaping Use   Vaping status: Never Used  Substance and Sexual Activity   Alcohol use: Not Currently   Drug use: Never   Sexual activity: Yes  Other Topics Concern   Not on file  Social History Narrative   epworth sleepiness scale = 10 (01/09/2016)   Social Determinants of Health   Financial Resource Strain: Low Risk  (11/21/2021)   Overall Financial Resource Strain (CARDIA)    Difficulty of Paying Living Expenses: Not very hard  Food Insecurity: No Food Insecurity (06/09/2023)   Hunger Vital Sign    Worried About Running Out of Food in the Last Year: Never true    Ran Out of Food in the Last Year: Never true  Transportation Needs: No Transportation Needs (06/09/2023)   PRAPARE - Administrator, Civil Service (Medical): No    Lack of Transportation (Non-Medical): No  Physical Activity: Inactive (11/21/2021)   Exercise Vital Sign    Days of Exercise per Week: 0 days    Minutes of Exercise per Session: 0  min  Stress: No Stress Concern Present (11/21/2021)   [employer] of Occupational Health - Occupational Stress Questionnaire    Feeling of Stress : Only a little  Social Connections: Moderately Integrated (11/21/2021)   Social Connection and Isolation Panel [NHANES]    Frequency of Communication with Friends and Family: Three times a week    Frequency of Social Gatherings with Friends and Family: Twice a week    Attends Religious Services: 1 to 4 times per year    Active Member of [employer] or Organizations: No    Attends Banker Meetings: Never    Marital Status: Married    Hospital Course:  Jennifer Davis was admitted for MDD (major depressive disorder), recurrent severe, without psychosis (HCC) and crisis management.  Jennifer Davis was discharged with current medication and was instructed on how to take medications as prescribed; (details listed below under Medication List).  Medical problems were identified and treated as needed.  Home medications were restarted as appropriate.  Improvement was monitored by observation and Jennifer Davis daily report of symptom reduction.  Emotional and mental status was monitored by daily self-inventory reports completed by Jennifer Davis and clinical staff.         Jennifer Davis was evaluated by the treatment team for stability and plans for continued recovery upon discharge.  Jennifer Davis motivation was an integral factor for scheduling further treatment.  Employment, transportation, bed availability, health status, family support, and any pending legal issues were also considered during her hospital stay.  She was offered further treatment options upon discharge including but not limited to Residential, Intensive Outpatient, and Outpatient treatment.  Jennifer Davis will follow up with the services as listed below under Follow Up Information.     Upon completion of this admission the Jennifer Davis was both mentally and medically stable for  discharge denying suicidal/homicidal ideation, auditory/visual/tactile hallucinations, delusional thoughts and paranoia.     Physical Findings: AIMS:  , ,  ,  ,    CIWA:    COWS:     Musculoskeletal: Strength & Muscle Tone: within normal limits Gait & Station: normal Patient leans: N/A   Psychiatric Specialty Exam:  Presentation  General Appearance:  Appropriate for Environment; Fairly Groomed  Eye Contact: Good  Speech: Clear and Coherent; Normal Rate  Speech Volume: Normal  Handedness: Right   Mood and Affect  Mood: Euthymic  Affect: Full Range   Thought Process  Thought Processes: Coherent; Goal Directed; Linear  Descriptions of Associations:Intact  Orientation:Full (Time, Place and Person)  Thought Content:Logical  History of  Schizophrenia/Schizoaffective disorder:No  Duration of Psychotic Symptoms:No data recorded Hallucinations:Hallucinations: None  Ideas of Reference:None  Suicidal Thoughts:Suicidal Thoughts: No  Homicidal Thoughts:Homicidal Thoughts: No   Sensorium  Memory: Immediate Good; Recent Good; Remote Good  Judgment: Good  Insight: Good   Executive Functions  Concentration: Good  Attention Span: Good  Recall: Good  Fund of Knowledge: Good  Language: Good   Psychomotor Activity  Psychomotor Activity: Psychomotor Activity: Normal   Assets  Assets: Communication Skills; Desire for Improvement; Financial Resources/Insurance; Housing; Leisure Time; Resilience; Social Support; Vocational/Educational   Sleep  Sleep: Sleep: Fair    Physical Exam: Physical Exam Constitutional:      General: She is not in acute distress.    Appearance: She is not ill-appearing, toxic-appearing or diaphoretic.  Eyes:     General: No scleral icterus. Cardiovascular:     Rate and Rhythm: Normal rate.  Pulmonary:     Effort: Pulmonary effort is normal.  Neurological:     Mental Status: She is alert and oriented to  person, place, and time.  Psychiatric:        Attention and Perception: Attention and perception normal.        Mood and Affect: Mood and affect normal.        Speech: Speech normal.        Behavior: Behavior normal. Behavior is cooperative.        Thought Content: Thought content normal.        Cognition and Memory: Cognition and memory normal.        Judgment: Judgment normal.    Review of Systems  Constitutional:  Negative for chills and fever.  Respiratory:  Negative for shortness of breath.   Cardiovascular:  Negative for chest pain and palpitations.  Gastrointestinal:  Negative for abdominal pain.  Neurological:  Negative for headaches.   Blood pressure 126/87, pulse 89, temperature 98.2 F (36.8 C), temperature source Oral, resp. rate 19, height 4\' 11"  (1.499 m), weight 71.4 kg, last menstrual period 12/10/2020, SpO2 99%. Body mass index is 31.79 kg/m.   Social History   Tobacco Use  Smoking Status Former   Current packs/day: 0.00   Types: Cigarettes   Quit date: 01/09/2012   Years since quitting: 11.4  Smokeless Tobacco Never   Tobacco Cessation:  N/A, patient does not currently use tobacco products   Blood Alcohol level:  Lab Results  Component Value Date   ETH <10 06/09/2023    Metabolic Disorder Labs:  Lab Results  Component Value Date   HGBA1C 5.8 (H) 06/09/2023   MPG 119.76 06/09/2023   MPG 108.28 09/22/2022   Lab Results  Component Value Date   PROLACTIN 8.4 06/09/2023   Lab Results  Component Value Date   CHOL 240 (H) 06/09/2023   TRIG 119 06/09/2023   HDL 75 06/09/2023   CHOLHDL 3.2 06/09/2023   VLDL 24 06/09/2023   LDLCALC 141 (H) 06/09/2023   LDLCALC 132 (H) 12/03/2021    See Psychiatric Specialty Exam and Suicide Risk Assessment completed by Attending Physician prior to discharge.  Discharge destination:  Home  Is patient on multiple antipsychotic therapies at discharge:  No   Has Patient had three or more failed trials of  antipsychotic monotherapy by history:  No  Recommended Plan for Multiple Antipsychotic Therapies: NA   Allergies as of 06/14/2023       Reactions   Bee Venom Anaphylaxis, Hives   wheezy   Dog Epithelium (canis Lupus Familiaris) Shortness Of Breath  Dust Mite Extract Other (See Comments)   sneezing   Grass Pollen(k-o-r-t-swt Vern) Other (See Comments)   sneezing   Other Itching   environmental allergies sometime activate asthma   Pollen Extract Other (See Comments)   sneezing        Medication List     STOP taking these medications    DULoxetine 30 MG capsule Commonly known as: Cymbalta   predniSONE 20 MG tablet Commonly known as: DELTASONE       TAKE these medications      Indication  albuterol 108 (90 Base) MCG/ACT inhaler Commonly known as: VENTOLIN HFA Inhale 2 puffs into the lungs every 4-6 hours as needed  Indication: Asthma   benzonatate 100 MG capsule Commonly known as: TESSALON Take 1 capsule (100 mg total) by mouth 3 (three) times daily as needed.  Indication: Cough   budesonide-formoterol 160-4.5 MCG/ACT inhaler Commonly known as: SYMBICORT Inhale 2 puffs into the lungs 2 (two) times daily.  Indication: Asthma   busPIRone 10 MG tablet Commonly known as: BUSPAR Take 1 tablet (10 mg total) by mouth 2 (two) times daily. What changed:  medication strength how much to take  Indication: Anxiety Disorder, Major Depressive Disorder   cetirizine 10 MG tablet Commonly known as: ZYRTEC Take 10 mg by mouth daily.  Indication: Hayfever   [employer] PROBIOTIC PO Take 1 capsule by mouth daily.  Indication: probiotic   diphenhydrAMINE 25 MG tablet Commonly known as: BENADRYL Take 50 mg by mouth every 6 (six) hours as needed (hives).  Indication: Acute Urticaria   EPINEPHrine 0.3 mg/0.3 mL Soaj injection Commonly known as: EPI-PEN Use as directed for anaphylaxis  Indication: Life-Threatening Hypersensitivity Reaction   estradiol 0.1 MG/GM vaginal  cream Commonly known as: ESTRACE Insert 0.5 grams vaginally 2 (two) times a week as directed  Indication: Vulvovaginal Atrophy   fluconazole 150 MG tablet Commonly known as: Diflucan Take 1 tablet (150 mg total) by mouth as directed, may repeat 1 tablet in 3 days  Indication: Vagina and Vulva Infection due to Candida Species Fungus   fluticasone 50 MCG/ACT nasal spray Commonly known as: FLONASE Place 2 sprays into both nostrils daily.  Indication: Allergic Rhinitis   gabapentin 100 MG capsule Commonly known as: NEURONTIN Take 1 capsule (100 mg total) by mouth 3 (three) times daily.  Indication: Generalized Anxiety Disorder   ibuprofen 200 MG tablet Commonly known as: ADVIL Take 400 mg by mouth every 6 (six) hours as needed for moderate pain.  Indication: Headache, Pain   melatonin 5 MG Tabs Take 5 mg by mouth at bedtime as needed (sleep).  Indication: Trouble Sleeping   montelukast 10 MG tablet Commonly known as: SINGULAIR Take 1 tablet (10 mg total) by mouth daily.  Indication: Asthma   Mounjaro 5 MG/0.5ML Pen Generic drug: tirzepatide Inject 5 mg into the skin once a week.  Indication: Type 2 Diabetes   Multi Vitamin Tabs 1 tablet Orally Once a day  Indication: Vitamin Deficiency   tamoxifen 10 MG tablet Commonly known as: NOLVADEX Take 1 tablet (10 mg total) by mouth daily.  Indication: Cancer of the Breast   venlafaxine XR 150 MG 24 hr capsule Commonly known as: EFFEXOR-XR Take 1 capsule (150 mg total) by mouth daily with breakfast. Start taking on: June 15, 2023  Indication: Major Depressive Disorder   Vitamin D 50 MCG (2000 UT) tablet Take 1 tablet (2,000 Units total) by mouth daily.  Indication: Vitamin D Deficiency  Follow-up Information     [facility] Follow up.   Why: Appointment is scheduled for 06/15/2023 at 5PM and 06/23/2023 at 12:00PM.  Both visits are telehealth. Contact information: [address] [place]  [address] [phone]                Follow-up recommendations:   Follow up with outpatient psychiatry and counseling  Signed: Lauree Chandler, NP 06/14/2023, 10:50 AM

## 2023-06-14 NOTE — Progress Notes (Signed)
  [facility] Adult Case Management Discharge Plan :  Will you be returning to the same living situation after discharge:  Yes,  pt reports that she is returning home. At discharge, do you have transportation home?: Yes,  pt reports that a peer will provide transportation. Do you have the ability to pay for your medications: Yes,  [place] [employer] / [place] [employer] PPO  Release of information consent forms completed and in the chart;  Patient's signature needed at discharge.  Patient to Follow up at:  Follow-up Information     [facility] Follow up.   Why: Appointment is scheduled for 06/15/2023 at 5PM and 06/23/2023 at 12:00PM.  Both visits are telehealth. Contact information: [address] [place] [address] [phone]                 Next level of care provider has access to [facility] Link:no  Safety Planning and Suicide Prevention discussed: Yes,  SPE completed with the patient's daughter     Has patient been referred to the Quitline?: Patient refused referral for treatment  Patient has been referred for addiction treatment: No known substance use disorder.  Harden Mo, LCSW 06/14/2023, 11:06 AM

## 2023-06-14 NOTE — Progress Notes (Signed)
Pt reports a good sleep and appetite. She feels that are anxiety and depression are "better." She shares how she had a lot of mood instability before where she felt "up and down" and then would have crying spells, but feels that her moods have improved now. She rated her anxiety and depression a 3 on a scale of 0-10 (10 being the worst). Her stressor includes her relationship with her husband, they're separated but she has been thinking about getting a divorce. She said that she has no trust in her husband anymore and he isn't there for her. Pt reports that their communication with each other has not been effective. They have been receiving marriage counseling for a couple of months now. Other stressors include finances and bills. She has been present in the milieu and she interacts well with her peers. She denies experiencing any side effects from her medications. Pt denies SI/HI and AVH. Active listening, reassurance, and support provided. Q 15 min safety checks continue. Pt's safety has been maintained.    06/13/23 2115  Psych Admission Type (Psych Patients Only)  Admission Status Voluntary  Psychosocial Assessment  Patient Complaints Anxiety;Depression;Worrying  Eye Contact Fair  Facial Expression Anxious  Affect Anxious;Appropriate to circumstance  Speech Logical/coherent  Interaction Assertive  Motor Activity Other (Comment) (WNL)  Appearance/Hygiene Unremarkable  Behavior Characteristics Appropriate to situation;Cooperative;Anxious  Mood Depressed;Anxious;Pleasant  Thought Process  Coherency WDL  Content Blaming others  Delusions None reported or observed  Perception WDL  Hallucination None reported or observed  Judgment WDL  Confusion None  Danger to Self  Current suicidal ideation? Denies  Self-Injurious Behavior No self-injurious ideation or behavior indicators observed or expressed   Agreement Not to Harm Self Yes  Description of Agreement verbally contracts for safety   Danger to Others  Danger to Others None reported or observed

## 2023-06-14 NOTE — Progress Notes (Signed)
Patient pleasant and cooperative on approach. Denies SI,HI and AVH. Verbalized understanding discharge instructions,prescriptions and follow up care.  All belongings returned from BMU locker. Suicide safety plan filled by patient and placed in chart. Copy given to patient.Patient escorted out by staff and transported by family. 
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
## 2023-06-14 NOTE — Progress Notes (Signed)
   06/14/23 0558  15 Minute Checks  Location Bedroom  Visual Appearance Calm  Behavior Sleeping  Sleep (Behavioral Health Patients Only)  Calculate sleep? (Click Yes once per 24 hr at 0600 safety check) Yes  Documented sleep last 24 hours 8.5

## 2023-06-14 NOTE — Group Note (Signed)
Date:  06/14/2023 Time:  10:00 AM  Group Topic/Focus:  OUTDOOR RECREATION AND RAPPORT BUILDING.    Participation Level:  Active  Participation Quality:  Appropriate, Attentive, Sharing, and Supportive  Affect:  Appropriate  Cognitive:  Alert, Appropriate, and Oriented  Insight: Appropriate  Engagement in Group:  Engaged and Improving  Modes of Intervention:  Socialization and Support  Additional Comments:       06/14/2023, 10:00 AM

## 2023-06-14 NOTE — Group Note (Signed)
Recreation Therapy Group Note   Group Topic:Coping Skills  Group Date: 06/14/2023 Start Time: 1000 End Time: 1100 Facilitators: Rosina Lowenstein, LRT, CTRS Location: Craft Room  Group Description: Mind Map.  Patient was provided a blank template of a diagram with 32 blank boxes in a tiered system, branching from the center (similar to a bubble chart). LRT directed patients to label the middle of the diagram "Coping Skills". LRT and patients then came up with 8 different coping skills as examples. Pt were directed to record their coping skills in the 2nd tier boxes closest to the center.  Patients would then share their coping skills with the group as LRT wrote them out. LRT gave a handout of 99 different coping skills at the end of group.    Goal Area(s) Addressed: Patients will be able to define "coping skills". Patient will identify new coping skills.  Patient will increase communication.   Affect/Mood: Appropriate   Participation Level: Active and Engaged   Participation Quality: Independent   Behavior: Calm and Cooperative   Speech/Thought Process: Coherent   Insight: Good   Judgement: Good   Modes of Intervention: Activity   Patient Response to Interventions:  Attentive, Engaged, Interested , and Receptive   Education Outcome:  Acknowledges education   Clinical Observations/Individualized Feedback: Jennifer Davis was active in their participation of session activities and group discussion. Pt identified "listen to pod casts and gardening" as coping skills. Pt spontaneously contributed to group discussion while interacting well with LRT and peers duration of session.  Plan: Continue to engage patient in RT group sessions 2-3x/week.   Rosina Lowenstein, LRT, CTRS 06/14/2023 11:36 AM

## 2023-06-15 DIAGNOSIS — F4312 Post-traumatic stress disorder, chronic: Secondary | ICD-10-CM | POA: Diagnosis not present

## 2023-06-21 ENCOUNTER — Other Ambulatory Visit (HOSPITAL_COMMUNITY): Payer: Self-pay

## 2023-06-21 ENCOUNTER — Telehealth: Payer: Self-pay

## 2023-06-21 DIAGNOSIS — Z789 Other specified health status: Secondary | ICD-10-CM | POA: Diagnosis not present

## 2023-06-21 DIAGNOSIS — N761 Subacute and chronic vaginitis: Secondary | ICD-10-CM | POA: Diagnosis not present

## 2023-06-21 DIAGNOSIS — L309 Dermatitis, unspecified: Secondary | ICD-10-CM | POA: Diagnosis not present

## 2023-06-21 DIAGNOSIS — Z113 Encounter for screening for infections with a predominantly sexual mode of transmission: Secondary | ICD-10-CM | POA: Diagnosis not present

## 2023-06-21 DIAGNOSIS — N898 Other specified noninflammatory disorders of vagina: Secondary | ICD-10-CM | POA: Diagnosis not present

## 2023-06-21 MED ORDER — METRONIDAZOLE 500 MG PO TABS
500.0000 mg | ORAL_TABLET | Freq: Two times a day (BID) | ORAL | 0 refills | Status: DC
  Filled 2023-06-21: qty 14, 7d supply, fill #0

## 2023-06-21 MED ORDER — FLUCONAZOLE 150 MG PO TABS
150.0000 mg | ORAL_TABLET | Freq: Every day | ORAL | 0 refills | Status: DC
  Filled 2023-06-21: qty 1, 1d supply, fill #0

## 2023-06-21 NOTE — Telephone Encounter (Signed)
Front staff received  FMLA paper work for pt, admitted 06/09/23 to [facility].   Last visit with Rosey Bath was 04/26/23. Will have to contact pt to see what the paper work should be for. Nothing noted.

## 2023-06-22 NOTE — Telephone Encounter (Signed)
Since she was hospitalized, ok to give FMLA. Out from 7/31 and back on 06/15/2023.

## 2023-06-23 NOTE — Telephone Encounter (Signed)
Noted thanks will get paper work completed

## 2023-06-25 DIAGNOSIS — Z0289 Encounter for other administrative examinations: Secondary | ICD-10-CM

## 2023-06-25 NOTE — Telephone Encounter (Signed)
I added intermittent. Will bring signed form to you.

## 2023-06-25 NOTE — Telephone Encounter (Signed)
Did I need to add any intermittent or just the hospitalization?

## 2023-06-28 NOTE — Telephone Encounter (Signed)
Paperwork faxed to [employer].

## 2023-06-30 DIAGNOSIS — R632 Polyphagia: Secondary | ICD-10-CM | POA: Diagnosis not present

## 2023-06-30 DIAGNOSIS — E663 Overweight: Secondary | ICD-10-CM | POA: Diagnosis not present

## 2023-06-30 DIAGNOSIS — Z6828 Body mass index (BMI) 28.0-28.9, adult: Secondary | ICD-10-CM | POA: Diagnosis not present

## 2023-06-30 DIAGNOSIS — F4323 Adjustment disorder with mixed anxiety and depressed mood: Secondary | ICD-10-CM | POA: Diagnosis not present

## 2023-07-01 DIAGNOSIS — F4312 Post-traumatic stress disorder, chronic: Secondary | ICD-10-CM | POA: Diagnosis not present

## 2023-07-04 ENCOUNTER — Other Ambulatory Visit (HOSPITAL_COMMUNITY): Payer: Self-pay

## 2023-07-05 ENCOUNTER — Other Ambulatory Visit (HOSPITAL_COMMUNITY): Payer: Self-pay

## 2023-07-05 ENCOUNTER — Other Ambulatory Visit: Payer: Self-pay

## 2023-07-05 MED ORDER — ALBUTEROL SULFATE HFA 108 (90 BASE) MCG/ACT IN AERS
2.0000 | INHALATION_SPRAY | RESPIRATORY_TRACT | 0 refills | Status: DC
  Filled 2023-07-05: qty 6.7, 25d supply, fill #0

## 2023-07-06 ENCOUNTER — Ambulatory Visit: Payer: Commercial Managed Care - PPO | Admitting: Physician Assistant

## 2023-07-06 DIAGNOSIS — F4312 Post-traumatic stress disorder, chronic: Secondary | ICD-10-CM | POA: Diagnosis not present

## 2023-07-08 ENCOUNTER — Telehealth (INDEPENDENT_AMBULATORY_CARE_PROVIDER_SITE_OTHER): Payer: Commercial Managed Care - PPO | Admitting: Physician Assistant

## 2023-07-08 ENCOUNTER — Other Ambulatory Visit (HOSPITAL_COMMUNITY): Payer: Self-pay

## 2023-07-08 ENCOUNTER — Encounter: Payer: Self-pay | Admitting: Physician Assistant

## 2023-07-08 DIAGNOSIS — Z630 Problems in relationship with spouse or partner: Secondary | ICD-10-CM

## 2023-07-08 DIAGNOSIS — Z9289 Personal history of other medical treatment: Secondary | ICD-10-CM | POA: Diagnosis not present

## 2023-07-08 DIAGNOSIS — F17200 Nicotine dependence, unspecified, uncomplicated: Secondary | ICD-10-CM

## 2023-07-08 DIAGNOSIS — F4323 Adjustment disorder with mixed anxiety and depressed mood: Secondary | ICD-10-CM

## 2023-07-08 MED ORDER — VENLAFAXINE HCL ER 150 MG PO CP24
150.0000 mg | ORAL_CAPSULE | Freq: Every day | ORAL | 1 refills | Status: DC
  Filled 2023-07-08: qty 30, 30d supply, fill #0

## 2023-07-08 MED ORDER — BUSPIRONE HCL 10 MG PO TABS
10.0000 mg | ORAL_TABLET | Freq: Two times a day (BID) | ORAL | 1 refills | Status: DC
  Filled 2023-07-08: qty 60, 30d supply, fill #0

## 2023-07-08 NOTE — Progress Notes (Addendum)
[facility]  Patient ID: Jennifer Davis,  MRN: [id number]  PCP: Georgann Housekeeper, MD  Date of Evaluation: 07/08/2023 Time spent:30 minutes   Chief Complaint:  Chief Complaint   Anxiety; Depression; Follow-up   Virtual Visit via Telehealth  I connected with patient by a video enabled telemedicine application, with their informed consent, and verified patient privacy and that I am speaking with the correct person using two identifiers.  I am private, in my office and the patient is at home.  I discussed the limitations, risks, security and privacy concerns of performing an evaluation and management service by video and the availability of in person appointments. I also discussed with the patient that there may be a patient responsible charge related to this service. The patient expressed understanding and agreed to proceed.   I discussed the assessment and treatment plan with the patient. The patient was provided an opportunity to ask questions and all were answered. The patient agreed with the plan and demonstrated an understanding of the instructions.   The patient was advised to call back or seek an in-person evaluation if the symptoms worsen or if the condition fails to improve as anticipated.  I provided 30 minutes of non-face-to-face time during this encounter.  HISTORY/CURRENT STATUS: HPI For hospital follow-up  Admitted to [facility] on 06/09/2023 through 06/14/2023.  Was suicidal with a plan and intent, after having a major argument with her husband.  They have been having marital problems for a while and have been separated for several months.  During the hospitalization she was changed from Cymbalta to Effexor.  States she might be feeling a little better.  She certainly feels better than when she went to the hospital.  Her husband and kids told her she looks like she feels better.  She is back at work.  Energy and motivation are fair to good, depends on the  day.  ADLs and personal hygiene are normal.  Appetite is normal and weight is stable.  She is not having feelings of hopelessness or crying easily. Sleeping well now. She didn't sleep well the first few nights when she came home from the hospital.  Denies suicidal or homicidal thoughts now.  When she was hospitalized the dose of BuSpar was increased.  Not having panic attacks but does still feel overwhelmed often.  She still smokes most days and is not ready to quit.  She gets more anxious if she is unable to smoke.  She might smoke 1 cigarette up to 5 cigarettes/day.  States she was started on an antipsychotic when she was hospitalized but it was stopped on discharge. Patient denies increased energy with decreased need for sleep, increased talkativeness, racing thoughts, impulsivity or risky behaviors, increased spending, increased libido, grandiosity, increased irritability or anger, paranoia, or hallucinations.  Review of Systems  Constitutional: Negative.   HENT: Negative.    Eyes: Negative.   Respiratory: Negative.    Cardiovascular: Negative.   Gastrointestinal: Negative.   Genitourinary: Negative.   Musculoskeletal: Negative.   Skin: Negative.   Neurological: Negative.   Endo/Heme/Allergies: Negative.   Psychiatric/Behavioral:         See HPI.   Individual Medical History/ Review of Systems: Changes? :Yes  see HPI   Past medications for mental health diagnoses include: Lexapro, Prozac wasn't effective, Xanax, Buspar worked but d/c when didn't need it.  Allergies: Bee venom, Dog epithelium (canis lupus familiaris), Dust mite extract, Grass pollen(k-o-r-t-swt vern), Other, and Pollen extract  Current  Medications:  Current Outpatient Medications:    albuterol (VENTOLIN HFA) 108 (90 Base) MCG/ACT inhaler, Inhale 2 puffs into the lungs every 4-6 hours as needed, Disp: 6.7 g, Rfl: 0   budesonide-formoterol (SYMBICORT) 160-4.5 MCG/ACT inhaler, Inhale 2 puffs into the lungs 2 (two)  times daily., Disp: 10.2 g, Rfl: 5   cetirizine (ZYRTEC) 10 MG tablet, Take 10 mg by mouth daily., Disp: , Rfl:    Cholecalciferol (VITAMIN D) 50 MCG (2000 UT) tablet, Take 1 tablet (2,000 Units total) by mouth daily., Disp: , Rfl:    diphenhydrAMINE (BENADRYL) 25 MG tablet, Take 50 mg by mouth every 6 (six) hours as needed (hives)., Disp: , Rfl:    estradiol (ESTRACE) 0.1 MG/GM vaginal cream, Insert 0.5 grams vaginally 2 (two) times a week as directed, Disp: 42.5 g, Rfl: 6   gabapentin (NEURONTIN) 100 MG capsule, Take 1 capsule (100 mg total) by mouth 3 (three) times daily., Disp: 270 capsule, Rfl: 0   ibuprofen (ADVIL) 200 MG tablet, Take 400 mg by mouth every 6 (six) hours as needed for moderate pain., Disp: , Rfl:    melatonin 5 MG TABS, Take 5 mg by mouth at bedtime as needed (sleep)., Disp: , Rfl:    montelukast (SINGULAIR) 10 MG tablet, Take 1 tablet (10 mg total) by mouth daily., Disp: 30 tablet, Rfl: 3   Multiple Vitamin (MULTI VITAMIN) TABS, 1 tablet Orally Once a day, Disp: , Rfl:    Probiotic Product ([employer] PROBIOTIC PO), Take 1 capsule by mouth daily., Disp: , Rfl:    tamoxifen (NOLVADEX) 10 MG tablet, Take 1 tablet (10 mg total) by mouth daily., Disp: 90 tablet, Rfl: 3   tirzepatide (MOUNJARO) 5 MG/0.5ML Pen, Inject 5 mg into the skin once a week., Disp: 6 mL, Rfl: 0   benzonatate (TESSALON) 100 MG capsule, Take 1 capsule (100 mg total) by mouth 3 (three) times daily as needed. (Patient not taking: Reported on 07/08/2023), Disp: 20 capsule, Rfl: 0   busPIRone (BUSPAR) 10 MG tablet, Take 1 tablet (10 mg total) by mouth 2 (two) times daily., Disp: 60 tablet, Rfl: 1   EPINEPHrine 0.3 mg/0.3 mL IJ SOAJ injection, Use as directed for anaphylaxis (Patient not taking: Reported on 03/30/2023), Disp: 2 each, Rfl: 1   fluticasone (FLONASE) 50 MCG/ACT nasal spray, Place 2 sprays into both nostrils daily. (Patient not taking: Reported on 07/08/2023), Disp: 16 g, Rfl: 0   venlafaxine XR (EFFEXOR-XR) 150  MG 24 hr capsule, Take 1 capsule (150 mg total) by mouth daily with breakfast., Disp: 30 capsule, Rfl: 1 Medication Side Effects: none  Family Medical/ Social History: Changes? She and husband are still separated.   MENTAL HEALTH EXAM:  Last menstrual period 12/10/2020.There is no height or weight on file to calculate BMI.  General Appearance: Casual and Well Groomed  Eye Contact:  Good  Speech:  Clear and Coherent and Normal Rate  Volume:  Normal  Mood:   sad  Affect:  Congruent  Thought Process:  Goal Directed and Descriptions of Associations: Circumstantial  Orientation:  Full (Time, Place, and Person)  Thought Content: Logical   Suicidal Thoughts:  No  Homicidal Thoughts:  No  Memory:  WNL  Judgement:  Good  Insight:  Good  Psychomotor Activity:  Normal  Concentration:  Concentration: Good and Attention Span: Good  Recall:  Good  Fund of Knowledge: Good  Language: Good  Assets:  Communication Skills Desire for Improvement Financial Resources/Insurance Housing Resilience Transportation Vocational/Educational  ADL's:  Intact  Cognition: WNL  Prognosis:  Good   Discharge summary from 06/14/2023 was reviewed. 06/09/2023 labs were reviewed.  DIAGNOSES:    ICD-10-CM   1. Situational mixed anxiety and depressive disorder  F43.23     2. Marital stress  Z63.0     3. Hospitalization within last 30 days  Z92.89     4. Smoker  F17.200      Receiving Psychotherapy: Yes  [facility]  RECOMMENDATIONS:  PDMP reviewed.  Gabapentin filled 03/19/2023. I provided 30 minutes of non-face-to-face time during this encounter, including time spent before and after the visit in records review, medical decision making, counseling pertinent to today's visit, and charting.   We discussed the hospitalization. She is not suicidal now.  But if the suicidal thoughts recur, she knows to call the office on-call service, 988/hotline, 911, or present to [facility] or ER if any  life-threatening psychiatric crisis. Patient verbalizes understanding.   Smoking cessation was discussed.  She is not ready to quit but states she will let me know if/when she is ready and would like help from me.  Continue Buspar 10 mg bid.  Continue gabapentin 100 mg, 1 p.o. 3 times daily as needed. Continue melatonin 5 mg nightly as needed sleep. Continue Effexor XR 150 mg, 1 p.o. daily. Continue therapy both individual and marriage counseling. Return in 3 to 4 weeks.  Melony Overly, PA-C

## 2023-07-12 DIAGNOSIS — F4312 Post-traumatic stress disorder, chronic: Secondary | ICD-10-CM | POA: Diagnosis not present

## 2023-07-13 ENCOUNTER — Other Ambulatory Visit (HOSPITAL_COMMUNITY): Payer: Self-pay

## 2023-07-19 ENCOUNTER — Other Ambulatory Visit (HOSPITAL_COMMUNITY): Payer: Self-pay

## 2023-07-19 DIAGNOSIS — F4312 Post-traumatic stress disorder, chronic: Secondary | ICD-10-CM | POA: Diagnosis not present

## 2023-07-28 ENCOUNTER — Ambulatory Visit: Payer: Commercial Managed Care - PPO | Admitting: Physician Assistant

## 2023-07-28 ENCOUNTER — Other Ambulatory Visit (HOSPITAL_COMMUNITY): Payer: Self-pay

## 2023-07-28 DIAGNOSIS — F4312 Post-traumatic stress disorder, chronic: Secondary | ICD-10-CM | POA: Diagnosis not present

## 2023-07-28 DIAGNOSIS — F411 Generalized anxiety disorder: Secondary | ICD-10-CM | POA: Diagnosis not present

## 2023-07-28 DIAGNOSIS — E663 Overweight: Secondary | ICD-10-CM | POA: Diagnosis not present

## 2023-07-28 DIAGNOSIS — R632 Polyphagia: Secondary | ICD-10-CM | POA: Diagnosis not present

## 2023-07-28 DIAGNOSIS — Z8639 Personal history of other endocrine, nutritional and metabolic disease: Secondary | ICD-10-CM | POA: Diagnosis not present

## 2023-07-28 DIAGNOSIS — E1169 Type 2 diabetes mellitus with other specified complication: Secondary | ICD-10-CM | POA: Diagnosis not present

## 2023-07-28 DIAGNOSIS — Z6828 Body mass index (BMI) 28.0-28.9, adult: Secondary | ICD-10-CM | POA: Diagnosis not present

## 2023-07-28 MED ORDER — MOUNJARO 7.5 MG/0.5ML ~~LOC~~ SOAJ
7.5000 mg | SUBCUTANEOUS | 3 refills | Status: DC
Start: 2023-07-28 — End: 2023-12-28
  Filled 2023-07-28: qty 2, 28d supply, fill #0
  Filled 2023-08-20: qty 2, 28d supply, fill #1
  Filled 2023-09-28: qty 2, 28d supply, fill #2
  Filled 2023-10-23: qty 2, 28d supply, fill #3

## 2023-07-30 ENCOUNTER — Ambulatory Visit: Payer: Commercial Managed Care - PPO | Admitting: Student

## 2023-08-04 ENCOUNTER — Encounter: Payer: Self-pay | Admitting: Physician Assistant

## 2023-08-04 ENCOUNTER — Other Ambulatory Visit (HOSPITAL_COMMUNITY): Payer: Self-pay

## 2023-08-04 ENCOUNTER — Telehealth: Payer: Commercial Managed Care - PPO | Admitting: Physician Assistant

## 2023-08-04 DIAGNOSIS — F4323 Adjustment disorder with mixed anxiety and depressed mood: Secondary | ICD-10-CM

## 2023-08-04 DIAGNOSIS — Z630 Problems in relationship with spouse or partner: Secondary | ICD-10-CM | POA: Diagnosis not present

## 2023-08-04 DIAGNOSIS — F17200 Nicotine dependence, unspecified, uncomplicated: Secondary | ICD-10-CM

## 2023-08-04 MED ORDER — VENLAFAXINE HCL ER 150 MG PO CP24
150.0000 mg | ORAL_CAPSULE | Freq: Every day | ORAL | 1 refills | Status: DC
  Filled 2023-08-04: qty 90, 90d supply, fill #0
  Filled 2023-10-23: qty 90, 90d supply, fill #1

## 2023-08-04 MED ORDER — BUSPIRONE HCL 10 MG PO TABS
10.0000 mg | ORAL_TABLET | Freq: Two times a day (BID) | ORAL | 1 refills | Status: DC
  Filled 2023-08-04: qty 180, 90d supply, fill #0
  Filled 2023-10-23: qty 180, 90d supply, fill #1

## 2023-08-04 NOTE — Progress Notes (Signed)
[facility]  Patient ID: Jennifer Davis,  MRN: [id number]  PCP: Georgann Housekeeper, MD  Date of Evaluation: 08/04/2023 Time spent:25 minutes   Chief Complaint:  Chief Complaint   Follow-up; Depression; Anxiety    Virtual Visit via Telehealth  I connected with patient by a video enabled telemedicine application, with their informed consent, and verified patient privacy and that I am speaking with the correct person using two identifiers.  I am private, in my office and the patient is at home.  I discussed the limitations, risks, security and privacy concerns of performing an evaluation and management service by video and the availability of in person appointments. I also discussed with the patient that there may be a patient responsible charge related to this service. The patient expressed understanding and agreed to proceed.   I discussed the assessment and treatment plan with the patient. The patient was provided an opportunity to ask questions and all were answered. The patient agreed with the plan and demonstrated an understanding of the instructions.   The patient was advised to call back or seek an in-person evaluation if the symptoms worsen or if the condition fails to improve as anticipated.  I provided 30 minutes of non-face-to-face time during this encounter.  HISTORY/CURRENT STATUS: HPI For routine f/u.   She feels even better than she did last month. Relationship with her husband, he's moving back home. Glad of that. They're not in counseling now d/t scheduling conflicts but they're communication better and that's been very helpful.   Patient is able to enjoy things.  Energy and motivation are good.  Work is going well.   No extreme sadness, tearfulness, or feelings of hopelessness.  Sleeps well most of the time. ADLs and personal hygiene are normal.   Denies any changes in concentration, making decisions, or remembering things.  Appetite has not changed.  Weight is  stable.  Anxiety is well controlled.  The BuSpar does help prevent it.  Not having panic attacks.  Denies suicidal or homicidal thoughts.  Patient denies increased energy with decreased need for sleep, increased talkativeness, racing thoughts, impulsivity or risky behaviors, increased spending, increased libido, grandiosity, increased irritability or anger, paranoia, or hallucinations.  Denies dizziness, syncope, seizures, numbness, tingling, tremor, tics, unsteady gait, slurred speech, confusion. Denies muscle or joint pain, stiffness, or dystonia.  Individual Medical History/ Review of Systems: Changes? :No    Past medications for mental health diagnoses include: Lexapro, Prozac wasn't effective, Xanax, Buspar worked but d/c when didn't need it.  Allergies: Bee venom, Dog epithelium (canis lupus familiaris), Dust mite extract, Grass pollen(k-o-r-t-swt vern), Other, and Pollen extract  Current Medications:  Current Outpatient Medications:    albuterol (VENTOLIN HFA) 108 (90 Base) MCG/ACT inhaler, Inhale 2 puffs into the lungs every 4-6 hours as needed, Disp: 6.7 g, Rfl: 0   budesonide-formoterol (SYMBICORT) 160-4.5 MCG/ACT inhaler, Inhale 2 puffs into the lungs 2 (two) times daily., Disp: 10.2 g, Rfl: 5   cetirizine (ZYRTEC) 10 MG tablet, Take 10 mg by mouth daily., Disp: , Rfl:    Cholecalciferol (VITAMIN D) 50 MCG (2000 UT) tablet, Take 1 tablet (2,000 Units total) by mouth daily., Disp: , Rfl:    diphenhydrAMINE (BENADRYL) 25 MG tablet, Take 50 mg by mouth every 6 (six) hours as needed (hives)., Disp: , Rfl:    estradiol (ESTRACE) 0.1 MG/GM vaginal cream, Insert 0.5 grams vaginally 2 (two) times a week as directed, Disp: 42.5 g, Rfl: 6   gabapentin (NEURONTIN) 100  MG capsule, Take 1 capsule (100 mg total) by mouth 3 (three) times daily., Disp: 270 capsule, Rfl: 0   ibuprofen (ADVIL) 200 MG tablet, Take 400 mg by mouth every 6 (six) hours as needed for moderate pain., Disp: , Rfl:     melatonin 5 MG TABS, Take 5 mg by mouth at bedtime as needed (sleep)., Disp: , Rfl:    montelukast (SINGULAIR) 10 MG tablet, Take 1 tablet (10 mg total) by mouth daily., Disp: 30 tablet, Rfl: 3   Multiple Vitamin (MULTI VITAMIN) TABS, 1 tablet Orally Once a day, Disp: , Rfl:    Probiotic Product ([employer] PROBIOTIC PO), Take 1 capsule by mouth daily., Disp: , Rfl:    tamoxifen (NOLVADEX) 10 MG tablet, Take 1 tablet (10 mg total) by mouth daily., Disp: 90 tablet, Rfl: 3   tirzepatide (MOUNJARO) 7.5 MG/0.5ML Pen, Inject 7.5 mg into the skin once a week., Disp: 2 mL, Rfl: 3   busPIRone (BUSPAR) 10 MG tablet, Take 1 tablet (10 mg total) by mouth 2 (two) times daily., Disp: 180 tablet, Rfl: 1   EPINEPHrine 0.3 mg/0.3 mL IJ SOAJ injection, Use as directed for anaphylaxis (Patient not taking: Reported on 03/30/2023), Disp: 2 each, Rfl: 1   fluticasone (FLONASE) 50 MCG/ACT nasal spray, Place 2 sprays into both nostrils daily. (Patient not taking: Reported on 07/08/2023), Disp: 16 g, Rfl: 0   tirzepatide (MOUNJARO) 5 MG/0.5ML Pen, Inject 5 mg into the skin once a week., Disp: 6 mL, Rfl: 0   venlafaxine XR (EFFEXOR-XR) 150 MG 24 hr capsule, Take 1 capsule (150 mg total) by mouth daily with breakfast., Disp: 90 capsule, Rfl: 1 Medication Side Effects: none  Family Medical/ Social History: Changes? See HPI  MENTAL HEALTH EXAM:  Last menstrual period 12/10/2020.There is no height or weight on file to calculate BMI.  General Appearance: Casual and Well Groomed  Eye Contact:  Good  Speech:  Clear and Coherent and Normal Rate  Volume:  Normal  Mood:  Euthymic  Affect:  Congruent  Thought Process:  Goal Directed and Descriptions of Associations: Circumstantial  Orientation:  Full (Time, Place, and Person)  Thought Content: Logical   Suicidal Thoughts:  No  Homicidal Thoughts:  No  Memory:  WNL  Judgement:  Good  Insight:  Good  Psychomotor Activity:  Normal  Concentration:  Concentration: Good and Attention  Span: Good  Recall:  Good  Fund of Knowledge: Good  Language: Good  Assets:  Communication Skills Desire for Improvement Financial Resources/Insurance Housing Resilience Transportation Vocational/Educational  ADL's:  Intact  Cognition: WNL  Prognosis:  Good   DIAGNOSES:    ICD-10-CM   1. Situational mixed anxiety and depressive disorder  F43.23     2. Smoker  F17.200     3. Marital stress  Z63.0      Receiving Psychotherapy: Yes  [facility]  RECOMMENDATIONS:  PDMP reviewed.  Gabapentin filled 03/19/2023. I provided 25 minutes of non-face-to-face time during this encounter, including time spent before and after the visit in records review, medical decision making, counseling pertinent to today's visit, and charting.   I am glad to see her doing better and she and her husband are working things out.  No change in treatment necessary.  Continue Buspar 10 mg bid.  Continue gabapentin 100 mg, 1 p.o. 3 times daily as needed. Continue melatonin 5 mg nightly as needed sleep. Continue Effexor XR 150 mg, 1 p.o. daily. Continue therapy both individual and marriage  counseling. Return in 3 to 4 weeks.  Melony Overly, PA-C

## 2023-08-06 DIAGNOSIS — F4312 Post-traumatic stress disorder, chronic: Secondary | ICD-10-CM | POA: Diagnosis not present

## 2023-08-11 ENCOUNTER — Other Ambulatory Visit (HOSPITAL_COMMUNITY): Payer: Self-pay

## 2023-08-11 ENCOUNTER — Encounter (HOSPITAL_COMMUNITY): Payer: Self-pay

## 2023-08-12 ENCOUNTER — Other Ambulatory Visit: Payer: Self-pay

## 2023-08-16 DIAGNOSIS — F4312 Post-traumatic stress disorder, chronic: Secondary | ICD-10-CM | POA: Diagnosis not present

## 2023-08-20 ENCOUNTER — Ambulatory Visit: Payer: Commercial Managed Care - PPO | Admitting: Plastic Surgery

## 2023-08-23 ENCOUNTER — Ambulatory Visit (INDEPENDENT_AMBULATORY_CARE_PROVIDER_SITE_OTHER): Payer: Commercial Managed Care - PPO | Admitting: Plastic Surgery

## 2023-08-23 DIAGNOSIS — Z9013 Acquired absence of bilateral breasts and nipples: Secondary | ICD-10-CM | POA: Diagnosis not present

## 2023-08-23 DIAGNOSIS — Z170 Estrogen receptor positive status [ER+]: Secondary | ICD-10-CM

## 2023-08-23 DIAGNOSIS — C50412 Malignant neoplasm of upper-outer quadrant of left female breast: Secondary | ICD-10-CM

## 2023-08-23 NOTE — Progress Notes (Signed)
Subjective:    Patient ID: Jennifer Davis, female    DOB: Jan 04, 1976, 47 y.o.   MRN: [id number]  The patient is a 47 year old female here for follow-up after undergoing breast reconstruction.  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  She did have a positive lymph node and the MammaPrint came back as high risk.  She decided on bilateral mastectomies with following chemo and radiation.  In January 2022 she underwent bilateral mastectomies with expander placement.  She had some complications and was taken back to the OR a month later and had the right breast expander replaced with acellular dermal matrix placed as well.  In May 2022 she had removal of the right breast seroma and Flex HD.  1 month later she had excision of the right breast wound.  In November 2023 she had bilateral expanders placed and acellular dermal matrix in the right breast.  In April 2024 she had removal of the expanders and placement of Mentor smooth round ultra high-profile gel 590 cc implants. She is 4 feet 11 inches tall and has recently lost 85 pounds with diet, exercise and healthy weight and wellness.  She is doing very well.  Now her implants look quite large.  No signs of lesions or concerns.  Left implant is slightly higher than the right which is likely due to the radiation treatment.      Review of Systems  Constitutional:  Positive for activity change and appetite change.  Eyes: Negative.   Respiratory: Negative.    Cardiovascular: Negative.   Gastrointestinal: Negative.   Endocrine: Negative.   Genitourinary: Negative.   Musculoskeletal: Negative.        Objective:   Physical Exam Constitutional:      Appearance: Normal appearance.  HENT:     Head: Atraumatic.  Cardiovascular:     Rate and Rhythm: Normal rate.     Pulses: Normal pulses.  Pulmonary:     Effort: Pulmonary effort is normal.  Abdominal:     Palpations: Abdomen is soft.  Skin:    General:  Skin is warm.     Capillary Refill: Capillary refill takes less than 2 seconds.  Neurological:     General: No focal deficit present.     Mental Status: She is alert.  Psychiatric:        Mood and Affect: Mood normal.        Behavior: Behavior normal.        Thought Content: Thought content normal.        Judgment: Judgment normal.        Assessment & Plan:     ICD-10-CM   1. Malignant neoplasm of upper-outer quadrant of left breast in female, estrogen receptor positive (HCC)  C50.412    Z17.0     2. Acquired absence of breast and absent nipple, bilateral  Z90.13     3. S/P mastectomy, bilateral  Z90.13        Patient overall is doing really well.  She may plan to lose 20 more pounds.  I would like to see her back in a year.  She may need some modifications due to the weight loss but we will address it when the time comes. Overall she is doing great.  Pictures were obtained of the patient and placed in the chart with the patient's or guardian's permission.

## 2023-08-26 DIAGNOSIS — F4312 Post-traumatic stress disorder, chronic: Secondary | ICD-10-CM | POA: Diagnosis not present

## 2023-08-30 DIAGNOSIS — F4312 Post-traumatic stress disorder, chronic: Secondary | ICD-10-CM | POA: Diagnosis not present

## 2023-09-22 DIAGNOSIS — Z6826 Body mass index (BMI) 26.0-26.9, adult: Secondary | ICD-10-CM | POA: Diagnosis not present

## 2023-09-22 DIAGNOSIS — E663 Overweight: Secondary | ICD-10-CM | POA: Diagnosis not present

## 2023-09-22 DIAGNOSIS — E1169 Type 2 diabetes mellitus with other specified complication: Secondary | ICD-10-CM | POA: Diagnosis not present

## 2023-09-22 DIAGNOSIS — Z8639 Personal history of other endocrine, nutritional and metabolic disease: Secondary | ICD-10-CM | POA: Diagnosis not present

## 2023-09-22 DIAGNOSIS — K5903 Drug induced constipation: Secondary | ICD-10-CM | POA: Diagnosis not present

## 2023-09-23 DIAGNOSIS — F4312 Post-traumatic stress disorder, chronic: Secondary | ICD-10-CM | POA: Diagnosis not present

## 2023-09-28 ENCOUNTER — Other Ambulatory Visit: Payer: Self-pay

## 2023-09-28 ENCOUNTER — Other Ambulatory Visit (HOSPITAL_COMMUNITY): Payer: Self-pay

## 2023-09-28 ENCOUNTER — Encounter (HOSPITAL_COMMUNITY): Payer: Self-pay

## 2023-09-30 DIAGNOSIS — F4312 Post-traumatic stress disorder, chronic: Secondary | ICD-10-CM | POA: Diagnosis not present

## 2023-10-11 ENCOUNTER — Other Ambulatory Visit (HOSPITAL_COMMUNITY): Payer: Self-pay

## 2023-10-11 DIAGNOSIS — F4312 Post-traumatic stress disorder, chronic: Secondary | ICD-10-CM | POA: Diagnosis not present

## 2023-10-12 ENCOUNTER — Other Ambulatory Visit (HOSPITAL_COMMUNITY): Payer: Self-pay

## 2023-10-12 ENCOUNTER — Encounter (HOSPITAL_COMMUNITY): Payer: Self-pay

## 2023-10-14 ENCOUNTER — Other Ambulatory Visit (HOSPITAL_COMMUNITY): Payer: Self-pay

## 2023-10-15 ENCOUNTER — Other Ambulatory Visit (HOSPITAL_COMMUNITY): Payer: Self-pay

## 2023-10-19 ENCOUNTER — Ambulatory Visit: Payer: Commercial Managed Care - PPO | Admitting: Physician Assistant

## 2023-10-20 DIAGNOSIS — Z8639 Personal history of other endocrine, nutritional and metabolic disease: Secondary | ICD-10-CM | POA: Diagnosis not present

## 2023-10-20 DIAGNOSIS — K5903 Drug induced constipation: Secondary | ICD-10-CM | POA: Diagnosis not present

## 2023-10-20 DIAGNOSIS — E1169 Type 2 diabetes mellitus with other specified complication: Secondary | ICD-10-CM | POA: Diagnosis not present

## 2023-10-20 DIAGNOSIS — E559 Vitamin D deficiency, unspecified: Secondary | ICD-10-CM | POA: Diagnosis not present

## 2023-10-20 DIAGNOSIS — E663 Overweight: Secondary | ICD-10-CM | POA: Diagnosis not present

## 2023-10-20 DIAGNOSIS — E782 Mixed hyperlipidemia: Secondary | ICD-10-CM | POA: Diagnosis not present

## 2023-10-20 DIAGNOSIS — Z6826 Body mass index (BMI) 26.0-26.9, adult: Secondary | ICD-10-CM | POA: Diagnosis not present

## 2023-10-23 ENCOUNTER — Other Ambulatory Visit (HOSPITAL_COMMUNITY): Payer: Self-pay

## 2023-10-25 ENCOUNTER — Other Ambulatory Visit: Payer: Self-pay

## 2023-10-25 ENCOUNTER — Other Ambulatory Visit (HOSPITAL_COMMUNITY): Payer: Self-pay

## 2023-10-25 MED ORDER — ALBUTEROL SULFATE HFA 108 (90 BASE) MCG/ACT IN AERS
2.0000 | INHALATION_SPRAY | RESPIRATORY_TRACT | 0 refills | Status: AC | PRN
  Filled 2023-10-25: qty 6.7, 17d supply, fill #0

## 2023-10-27 ENCOUNTER — Emergency Department
Admission: EM | Admit: 2023-10-27 | Discharge: 2023-10-28 | Disposition: A | Discharge status: Home / Self Care | Payer: Commercial Managed Care - PPO | Attending: Emergency Medicine | Admitting: Emergency Medicine

## 2023-10-27 ENCOUNTER — Emergency Department: Payer: Commercial Managed Care - PPO

## 2023-10-27 ENCOUNTER — Telehealth: Payer: Commercial Managed Care - PPO | Admitting: Family Medicine

## 2023-10-27 ENCOUNTER — Other Ambulatory Visit: Payer: Self-pay

## 2023-10-27 ENCOUNTER — Other Ambulatory Visit (HOSPITAL_COMMUNITY): Payer: Self-pay

## 2023-10-27 DIAGNOSIS — J189 Pneumonia, unspecified organism: Secondary | ICD-10-CM | POA: Diagnosis not present

## 2023-10-27 DIAGNOSIS — J4540 Moderate persistent asthma, uncomplicated: Secondary | ICD-10-CM | POA: Diagnosis not present

## 2023-10-27 DIAGNOSIS — J101 Influenza due to other identified influenza virus with other respiratory manifestations: Secondary | ICD-10-CM | POA: Diagnosis not present

## 2023-10-27 DIAGNOSIS — R059 Cough, unspecified: Secondary | ICD-10-CM | POA: Diagnosis present

## 2023-10-27 DIAGNOSIS — Z20822 Contact with and (suspected) exposure to covid-19: Secondary | ICD-10-CM | POA: Diagnosis not present

## 2023-10-27 DIAGNOSIS — J3089 Other allergic rhinitis: Secondary | ICD-10-CM

## 2023-10-27 DIAGNOSIS — J45909 Unspecified asthma, uncomplicated: Secondary | ICD-10-CM | POA: Insufficient documentation

## 2023-10-27 DIAGNOSIS — R918 Other nonspecific abnormal finding of lung field: Secondary | ICD-10-CM | POA: Diagnosis not present

## 2023-10-27 DIAGNOSIS — J069 Acute upper respiratory infection, unspecified: Secondary | ICD-10-CM | POA: Diagnosis not present

## 2023-10-27 DIAGNOSIS — J181 Lobar pneumonia, unspecified organism: Secondary | ICD-10-CM | POA: Insufficient documentation

## 2023-10-27 LAB — RESP PANEL BY RT-PCR (RSV, FLU A&B, COVID)  RVPGX2
Influenza A by PCR: POSITIVE — AB
Influenza B by PCR: NEGATIVE
Resp Syncytial Virus by PCR: NEGATIVE
SARS Coronavirus 2 by RT PCR: NEGATIVE

## 2023-10-27 MED ORDER — ALBUTEROL SULFATE (2.5 MG/3ML) 0.083% IN NEBU
2.5000 mg | INHALATION_SOLUTION | Freq: Four times a day (QID) | RESPIRATORY_TRACT | 1 refills | Status: AC | PRN
Start: 2023-10-27 — End: ?
  Filled 2023-10-27: qty 75, 7d supply, fill #0

## 2023-10-27 MED ORDER — MONTELUKAST SODIUM 10 MG PO TABS
10.0000 mg | ORAL_TABLET | Freq: Every day | ORAL | 0 refills | Status: DC
Start: 2023-10-27 — End: 2024-01-19
  Filled 2023-10-27: qty 90, 90d supply, fill #0

## 2023-10-27 MED ORDER — FLUTICASONE PROPIONATE 50 MCG/ACT NA SUSP
2.0000 | Freq: Every day | NASAL | 0 refills | Status: AC
Start: 2023-10-27 — End: ?
  Filled 2023-10-27: qty 16, 30d supply, fill #0

## 2023-10-27 MED ORDER — PROMETHAZINE-DM 6.25-15 MG/5ML PO SYRP
5.0000 mL | ORAL_SOLUTION | Freq: Four times a day (QID) | ORAL | 0 refills | Status: DC | PRN
Start: 2023-10-27 — End: 2024-06-08
  Filled 2023-10-27: qty 118, 6d supply, fill #0

## 2023-10-27 NOTE — ED Triage Notes (Addendum)
Pt reports fever, cough congestion x2days. Pt reports taking both tylenol and ibuprofen 2 hours ago

## 2023-10-27 NOTE — Progress Notes (Signed)
Virtual Visit Consent   KAMIYLAH MEECE, you are scheduled for a virtual visit with a [facility] provider today. Just as with appointments in the office, your consent must be obtained to participate. Your consent will be active for this visit and any virtual visit you may have with one of our providers in the next 365 days. If you have a MyChart account, a copy of this consent can be sent to you electronically.  As this is a virtual visit, video technology does not allow for your provider to perform a traditional examination. This may limit your provider's ability to fully assess your condition. If your provider identifies any concerns that need to be evaluated in person or the need to arrange testing (such as labs, EKG, etc.), we will make arrangements to do so. Although advances in technology are sophisticated, we cannot ensure that it will always work on either your end or our end. If the connection with a video visit is poor, the visit may have to be switched to a telephone visit. With either a video or telephone visit, we are not always able to ensure that we have a secure connection.  By engaging in this virtual visit, you consent to the provision of healthcare and authorize for your insurance to be billed (if applicable) for the services provided during this visit. Depending on your insurance coverage, you may receive a charge related to this service.  I need to obtain your verbal consent now. Are you willing to proceed with your visit today? NAO CALLICOAT has provided verbal consent on 10/27/2023 for a virtual visit (video or telephone). Freddy Finner, NP  Date: 10/27/2023 11:49 AM  Virtual Visit via Video Note   I, Freddy Finner, connected with  PERMELIA AVENA  ([id number], [birth year]) on 10/27/23 at 11:45 AM EST by a video-enabled telemedicine application and verified that I am speaking with the correct person using two identifiers.  Location: Patient: Virtual Visit Location Patient:  Home Provider: Virtual Visit Location Provider: Home Office   I discussed the limitations of evaluation and management by telemedicine and the availability of in person appointments. The patient expressed understanding and agreed to proceed.    History of Present Illness: DELISE HAMIDI is a 47 y.o. who identifies as a female who was assigned female at birth, and is being seen today for sore throat  Onset was yesterday with sore throat, developed a post nasal drip, coughing Associated symptoms are congestion and stuffiness, headache, and temp 99.7, feels like chest is tight and inflamed  Modifying factors are ny quil- helped some, day quil not much help, and using inhaler- but not able to get relief from this Denies chest pain, shortness of breath, fevers, chills History of asthma, out of singular, and not currently using Flonase either.  Exposure to sick contacts- known with sick child COVID test: no Vaccines: Flu yes, no covid booster,    Problems:  Patient Active Problem List   Diagnosis Date Noted   MDD (major depressive disorder), recurrent severe, without psychosis (HCC) 06/09/2023   Suicidal ideation 06/09/2023   Encounter for counseling 11/17/2022   Acquired absence of breast and absent nipple, bilateral 06/02/2022   Vitamin D deficiency 09/26/2021   Allergic rhinitis 09/26/2021   Arthritis of hip 09/26/2021   Port-A-Cath in place 01/22/2021   S/P mastectomy, bilateral 12/12/2020   Breast cancer (HCC) 12/04/2020   Genetic testing 10/02/2020   Malignant neoplasm of upper-outer quadrant of left breast in  female, estrogen receptor positive (HCC) 09/19/2020   Depression 08/06/2018   PTSD (post-traumatic stress disorder) 08/06/2018   Anxiety 01/09/2016   Asthma 01/09/2016   Morbid obesity (HCC) 01/09/2016    Allergies:  Allergies  Allergen Reactions   Bee Venom Anaphylaxis and Hives    wheezy   Dog Epithelium (Canis Lupus Familiaris) Shortness Of Breath   Dust Mite  Extract Other (See Comments)    sneezing   Grass Pollen(K-O-R-T-Swt Vern) Other (See Comments)    sneezing   Other Itching    environmental allergies sometime activate asthma   Pollen Extract Other (See Comments)    sneezing   Medications:  Current Outpatient Medications:    albuterol (VENTOLIN HFA) 108 (90 Base) MCG/ACT inhaler, Inhale 2 puffs into the lungs every 4-6 hours as needed., Disp: 6.7 g, Rfl: 0   budesonide-formoterol (SYMBICORT) 160-4.5 MCG/ACT inhaler, Inhale 2 puffs into the lungs 2 (two) times daily., Disp: 10.2 g, Rfl: 5   busPIRone (BUSPAR) 10 MG tablet, Take 1 tablet (10 mg total) by mouth 2 (two) times daily., Disp: 180 tablet, Rfl: 1   cetirizine (ZYRTEC) 10 MG tablet, Take 10 mg by mouth daily., Disp: , Rfl:    Cholecalciferol (VITAMIN D) 50 MCG (2000 UT) tablet, Take 1 tablet (2,000 Units total) by mouth daily., Disp: , Rfl:    diphenhydrAMINE (BENADRYL) 25 MG tablet, Take 50 mg by mouth every 6 (six) hours as needed (hives)., Disp: , Rfl:    EPINEPHrine 0.3 mg/0.3 mL IJ SOAJ injection, Use as directed for anaphylaxis, Disp: 2 each, Rfl: 1   estradiol (ESTRACE) 0.1 MG/GM vaginal cream, Insert 0.5 grams vaginally 2 (two) times a week as directed, Disp: 42.5 g, Rfl: 6   fluticasone (FLONASE) 50 MCG/ACT nasal spray, Place 2 sprays into both nostrils daily., Disp: 16 g, Rfl: 0   gabapentin (NEURONTIN) 100 MG capsule, Take 1 capsule (100 mg total) by mouth 3 (three) times daily., Disp: 270 capsule, Rfl: 0   ibuprofen (ADVIL) 200 MG tablet, Take 400 mg by mouth every 6 (six) hours as needed for moderate pain., Disp: , Rfl:    melatonin 5 MG TABS, Take 5 mg by mouth at bedtime as needed (sleep)., Disp: , Rfl:    montelukast (SINGULAIR) 10 MG tablet, Take 1 tablet (10 mg total) by mouth daily., Disp: 30 tablet, Rfl: 3   Multiple Vitamin (MULTI VITAMIN) TABS, 1 tablet Orally Once a day, Disp: , Rfl:    Probiotic Product ([employer] PROBIOTIC PO), Take 1 capsule by mouth daily., Disp:  , Rfl:    tamoxifen (NOLVADEX) 10 MG tablet, Take 1 tablet (10 mg total) by mouth daily., Disp: 90 tablet, Rfl: 3   tirzepatide (MOUNJARO) 5 MG/0.5ML Pen, Inject 5 mg into the skin once a week., Disp: 6 mL, Rfl: 0   tirzepatide (MOUNJARO) 7.5 MG/0.5ML Pen, Inject 7.5 mg into the skin once a week., Disp: 2 mL, Rfl: 3   venlafaxine XR (EFFEXOR-XR) 150 MG 24 hr capsule, Take 1 capsule (150 mg total) by mouth daily with breakfast., Disp: 90 capsule, Rfl: 1  Observations/Objective: Patient is well-developed, well-nourished in no acute distress.  Resting comfortably  at home.  Head is normocephalic, atraumatic.  No labored breathing.  Speech is clear and coherent with logical content.  Patient is alert and oriented at baseline.    Assessment and Plan:  1. Moderate persistent asthma without complication (Primary)  - montelukast (SINGULAIR) 10 MG tablet; Take 1 tablet (10 mg total) by mouth  at bedtime.  Dispense: 90 tablet; Refill: 0 - promethazine-dextromethorphan (PROMETHAZINE-DM) 6.25-15 MG/5ML syrup; Take 5 mLs by mouth 4 (four) times daily as needed for cough.  Dispense: 118 mL; Refill: 0 - albuterol (PROVENTIL) (2.5 MG/3ML) 0.083% nebulizer solution; Take 3 mLs (2.5 mg total) by nebulization every 6 (six) hours as needed for wheezing or shortness of breath.  Dispense: 75 mL; Refill: 1  2. Viral URI with cough  - montelukast (SINGULAIR) 10 MG tablet; Take 1 tablet (10 mg total) by mouth at bedtime.  Dispense: 90 tablet; Refill: 0 - fluticasone (FLONASE) 50 MCG/ACT nasal spray; Place 2 sprays into both nostrils daily.  Dispense: 16 g; Refill: 0 - promethazine-dextromethorphan (PROMETHAZINE-DM) 6.25-15 MG/5ML syrup; Take 5 mLs by mouth 4 (four) times daily as needed for cough.  Dispense: 118 mL; Refill: 0 - albuterol (PROVENTIL) (2.5 MG/3ML) 0.083% nebulizer solution; Take 3 mLs (2.5 mg total) by nebulization every 6 (six) hours as needed for wheezing or shortness of breath.  Dispense: 75 mL;  Refill: 1  3. Non-seasonal allergic rhinitis, unspecified trigger  - fluticasone (FLONASE) 50 MCG/ACT nasal spray; Place 2 sprays into both nostrils daily.  Dispense: 16 g; Refill: 0 - promethazine-dextromethorphan (PROMETHAZINE-DM) 6.25-15 MG/5ML syrup; Take 5 mLs by mouth 4 (four) times daily as needed for cough.  Dispense: 118 mL; Refill: 0  -seems to have a viral URI that is flaring up asthma some -given duration will try to avoid steroids and get cough and congestion PND under control first -refills of flonase, singular, and nebs given -promethazine DM provided -covid test encouraged   Reviewed side effects, risks and benefits of medication.    Patient acknowledged agreement and understanding of the plan.   Past Medical, Surgical, Social History, Allergies, and Medications have been Reviewed.    Follow Up Instructions: I discussed the assessment and treatment plan with the patient. The patient was provided an opportunity to ask questions and all were answered. The patient agreed with the plan and demonstrated an understanding of the instructions.  A copy of instructions were sent to the patient via MyChart unless otherwise noted below.    The patient was advised to call back or seek an in-person evaluation if the symptoms worsen or if the condition fails to improve as anticipated.    Freddy Finner, NP

## 2023-10-27 NOTE — Patient Instructions (Signed)
Jennifer Davis, thank you for joining Freddy Finner, NP for today's virtual visit.  While this provider is not your primary care provider (PCP), if your PCP is located in our provider database this encounter information will be shared with them immediately following your visit.   A [facility] MyChart account gives you access to today's visit and all your visits, tests, and labs performed at [facility] " click here if you don't have a [facility] MyChart account or go to [email or web address]  Consent: (Patient) Jennifer Davis provided verbal consent for this virtual visit at the beginning of the encounter.  Current Medications:  Current Outpatient Medications:    albuterol (PROVENTIL) (2.5 MG/3ML) 0.083% nebulizer solution, Take 3 mLs (2.5 mg total) by nebulization every 6 (six) hours as needed for wheezing or shortness of breath., Disp: 75 mL, Rfl: 1   fluticasone (FLONASE) 50 MCG/ACT nasal spray, Place 2 sprays into both nostrils daily., Disp: 16 g, Rfl: 0   montelukast (SINGULAIR) 10 MG tablet, Take 1 tablet (10 mg total) by mouth at bedtime., Disp: 90 tablet, Rfl: 0   promethazine-dextromethorphan (PROMETHAZINE-DM) 6.25-15 MG/5ML syrup, Take 5 mLs by mouth 4 (four) times daily as needed for cough., Disp: 118 mL, Rfl: 0   albuterol (VENTOLIN HFA) 108 (90 Base) MCG/ACT inhaler, Inhale 2 puffs into the lungs every 4-6 hours as needed., Disp: 6.7 g, Rfl: 0   budesonide-formoterol (SYMBICORT) 160-4.5 MCG/ACT inhaler, Inhale 2 puffs into the lungs 2 (two) times daily., Disp: 10.2 g, Rfl: 5   busPIRone (BUSPAR) 10 MG tablet, Take 1 tablet (10 mg total) by mouth 2 (two) times daily., Disp: 180 tablet, Rfl: 1   cetirizine (ZYRTEC) 10 MG tablet, Take 10 mg by mouth daily., Disp: , Rfl:    Cholecalciferol (VITAMIN D) 50 MCG (2000 UT) tablet, Take 1 tablet (2,000 Units total) by mouth daily., Disp: , Rfl:    diphenhydrAMINE (BENADRYL) 25 MG tablet, Take 50 mg by mouth every 6 (six)  hours as needed (hives)., Disp: , Rfl:    EPINEPHrine 0.3 mg/0.3 mL IJ SOAJ injection, Use as directed for anaphylaxis, Disp: 2 each, Rfl: 1   estradiol (ESTRACE) 0.1 MG/GM vaginal cream, Insert 0.5 grams vaginally 2 (two) times a week as directed, Disp: 42.5 g, Rfl: 6   gabapentin (NEURONTIN) 100 MG capsule, Take 1 capsule (100 mg total) by mouth 3 (three) times daily., Disp: 270 capsule, Rfl: 0   ibuprofen (ADVIL) 200 MG tablet, Take 400 mg by mouth every 6 (six) hours as needed for moderate pain., Disp: , Rfl:    melatonin 5 MG TABS, Take 5 mg by mouth at bedtime as needed (sleep)., Disp: , Rfl:    Multiple Vitamin (MULTI VITAMIN) TABS, 1 tablet Orally Once a day, Disp: , Rfl:    Probiotic Product ([employer]S PROBIOTIC PO), Take 1 capsule by mouth daily., Disp: , Rfl:    tamoxifen (NOLVADEX) 10 MG tablet, Take 1 tablet (10 mg total) by mouth daily., Disp: 90 tablet, Rfl: 3   tirzepatide (MOUNJARO) 5 MG/0.5ML Pen, Inject 5 mg into the skin once a week., Disp: 6 mL, Rfl: 0   tirzepatide (MOUNJARO) 7.5 MG/0.5ML Pen, Inject 7.5 mg into the skin once a week., Disp: 2 mL, Rfl: 3   venlafaxine XR (EFFEXOR-XR) 150 MG 24 hr capsule, Take 1 capsule (150 mg total) by mouth daily with breakfast., Disp: 90 capsule, Rfl: 1   Medications ordered in this encounter:  Meds ordered this encounter  Medications  montelukast (SINGULAIR) 10 MG tablet    Sig: Take 1 tablet (10 mg total) by mouth at bedtime.    Dispense:  90 tablet    Refill:  0    Supervising Provider:   Merrilee Jansky [[id number]]   fluticasone (FLONASE) 50 MCG/ACT nasal spray    Sig: Place 2 sprays into both nostrils daily.    Dispense:  16 g    Refill:  0    Supervising Provider:   Merrilee Jansky [[id number]]   promethazine-dextromethorphan (PROMETHAZINE-DM) 6.25-15 MG/5ML syrup    Sig: Take 5 mLs by mouth 4 (four) times daily as needed for cough.    Dispense:  118 mL    Refill:  0    Supervising Provider:   Merrilee Jansky [[id number]]    albuterol (PROVENTIL) (2.5 MG/3ML) 0.083% nebulizer solution    Sig: Take 3 mLs (2.5 mg total) by nebulization every 6 (six) hours as needed for wheezing or shortness of breath.    Dispense:  75 mL    Refill:  1    Supervising Provider:   Merrilee Jansky [[id number]]     *If you need refills on other medications prior to your next appointment, please contact your pharmacy*  Follow-Up: Call back or seek an in-person evaluation if the symptoms worsen or if the condition fails to improve as anticipated.  [facility] [phone]  Other Instructions   -seems to have a viral URI that is flaring up asthma some -given duration will try to avoid steroids and get cough and congestion PND under control first -refills of flonase, singular, and nebs given -promethazine DM provided -covid test encouraged   If you have been instructed to have an in-person evaluation today at a local Urgent Care facility, please use the link below. It will take you to a list of all of our available [facility], including address, phone number and hours of operation. Please do not delay care.  [facility]  If you or a family member do not have a primary care provider, use the link below to schedule a visit and establish care. When you choose a [facility] primary care physician or advanced practice provider, you gain a long-term partner in health. Find a Primary Care Provider  Learn more about [facility]'s in-office and virtual care options: [facility] Get Care Now

## 2023-10-28 ENCOUNTER — Other Ambulatory Visit (HOSPITAL_COMMUNITY): Payer: Self-pay

## 2023-10-28 MED ORDER — AMOXICILLIN 500 MG PO CAPS
1000.0000 mg | ORAL_CAPSULE | Freq: Three times a day (TID) | ORAL | 0 refills | Status: AC
  Filled 2023-10-28 (×2): qty 30, 5d supply, fill #0

## 2023-10-28 MED ORDER — PREDNISONE 50 MG PO TABS
50.0000 mg | ORAL_TABLET | Freq: Every day | ORAL | 0 refills | Status: AC
  Filled 2023-10-28 (×2): qty 3, 3d supply, fill #0

## 2023-10-28 MED ORDER — ACETAMINOPHEN 500 MG PO TABS
1000.0000 mg | ORAL_TABLET | Freq: Once | ORAL | Status: AC
  Administered 2023-10-28: 1000 mg via ORAL
  Filled 2023-10-28: qty 2

## 2023-10-28 MED ORDER — AMOXICILLIN 500 MG PO CAPS
1000.0000 mg | ORAL_CAPSULE | Freq: Once | ORAL | Status: AC
  Administered 2023-10-28: 1000 mg via ORAL
  Filled 2023-10-28: qty 2

## 2023-10-28 MED ORDER — PREDNISONE 20 MG PO TABS
60.0000 mg | ORAL_TABLET | Freq: Once | ORAL | Status: AC
  Administered 2023-10-28: 60 mg via ORAL
  Filled 2023-10-28: qty 3

## 2023-10-28 MED ORDER — IBUPROFEN 600 MG PO TABS
600.0000 mg | ORAL_TABLET | Freq: Once | ORAL | Status: AC
  Administered 2023-10-28: 600 mg via ORAL
  Filled 2023-10-28: qty 1

## 2023-10-28 NOTE — ED Provider Notes (Signed)
[facility] Provider Note    Event Date/Time   First MD Initiated Contact with Patient 10/27/23 2354     (approximate)   History   Cough   HPI  Jennifer Davis is a 47 y.o. female   Past medical history of asthma, up-to-date on vaccinations including influenza with 2 days of myalgias, cough, sore throat, fever.    No known sick contacts.  No GI or GU symptoms.       Physical Exam   Triage Vital Signs: ED Triage Vitals  Encounter Vitals Group     BP 10/27/23 2129 104/79     Systolic BP Percentile --      Diastolic BP Percentile --      Pulse Rate 10/27/23 2129 (!) 120     Resp 10/27/23 2129 18     Temp 10/27/23 2129 100.2 F (37.9 C)     Temp Source 10/27/23 2129 Oral     SpO2 10/27/23 2129 100 %     Weight 10/27/23 2130 135 lb (61.2 kg)     Height 10/27/23 2130 5' (1.524 m)     Head Circumference --      Peak Flow --      Pain Score --      Pain Loc --      Pain Education --      Exclude from Growth Chart --     Most recent vital signs: Vitals:   10/27/23 2129 10/28/23 0136  BP: 104/79 (!) 90/59  Pulse: (!) 120 95  Resp: 18 18  Temp: 100.2 F (37.9 C) 98.3 F (36.8 C)  SpO2: 100% 100%    General: Awake, no distress.  CV:  Good peripheral perfusion.  Resp:  Normal effort.  Abd:  No distention.  Other:  Awake alert comfortable appearing nontoxic no fever.  Neck supple full range of motion.  No obvious wheezing or focalities on lung auscultation.  Soft benign abdominal exam.   ED Results / Procedures / Treatments   Labs (all labs ordered are listed, but only abnormal results are displayed) Labs Reviewed  RESP PANEL BY RT-PCR (RSV, FLU A&B, COVID)  [id number] - Abnormal; Notable for the following components:      Result Value   Influenza A by PCR POSITIVE (*)    All other components within normal limits     I ordered and reviewed the above labs they are notable for positive for influenza A.  RADIOLOGY I independently  reviewed and interpreted chest x-ray and see right middle lobe opacity concerning from a pneumonia I also reviewed radiologist's formal read.   PROCEDURES:  Critical Care performed: No  Procedures   MEDICATIONS ORDERED IN ED: Medications  amoxicillin (AMOXIL) capsule 1,000 mg (1,000 mg Oral Given 10/28/23 0133)  predniSONE (DELTASONE) tablet 60 mg (60 mg Oral Given 10/28/23 0133)  ibuprofen (ADVIL) tablet 600 mg (600 mg Oral Given 10/28/23 0133)  acetaminophen (TYLENOL) tablet 1,000 mg (1,000 mg Oral Given 10/28/23 0133)    IMPRESSION / MDM / ASSESSMENT AND PLAN / ED COURSE  I reviewed the triage vital signs and the nursing notes.                                Patient's presentation is most consistent with acute presentation with potential threat to life or bodily function.  Differential diagnosis includes, but is not limited to, viral URI, pneumonia, asthma exacerbation  The patient is on the cardiac monitor to evaluate for evidence of arrhythmia and/or significant heart rate changes.  MDM:    Respiratory infectious symptoms in this patient with positive influenza A and opacity in the right midlung field concerning for pneumonia.  No evidence of asthma exacerbation as she has clear lungs with no wheezing no respiratory distress.  Will treat with antibiotic.  Will give prednisone burst.  Considered hospitalization but given vital sign stability, young healthy patient, I think outpatient course would be appropriate.  Discharge.       FINAL CLINICAL IMPRESSION(S) / ED DIAGNOSES   Final diagnoses:  Influenza A  Community acquired pneumonia of right middle lobe of lung     Rx / DC Orders   ED Discharge Orders          Ordered    predniSONE (DELTASONE) 50 MG tablet  Daily        10/28/23 0131    amoxicillin (AMOXIL) 500 MG capsule  3 times daily        10/28/23 0131             Note:  This document was prepared using Dragon voice recognition software and  may include unintentional dictation errors.    Pilar Jarvis, MD 10/28/23 [phone]

## 2023-10-28 NOTE — Discharge Instructions (Signed)
Take acetaminophen 650 mg and ibuprofen 400 mg every 6 hours for pain.  Take with food. Take antibiotic for full course.   Drink plenty of fluids to stay well-hydrated.  Find Pedialyte or similar electrolyte rehydration formulas at your local pharmacy. Thank you for choosing [facility] for your health care today!  Please see your primary doctor this week for a follow up appointment.   If you have any new, worsening, or unexpected symptoms call your doctor right away or come back to the emergency department for reevaluation.  It was my pleasure to care for you today.   Daneil Dan Modesto Charon, MD

## 2023-10-29 DIAGNOSIS — F4312 Post-traumatic stress disorder, chronic: Secondary | ICD-10-CM | POA: Diagnosis not present

## 2023-11-04 DIAGNOSIS — F4312 Post-traumatic stress disorder, chronic: Secondary | ICD-10-CM | POA: Diagnosis not present

## 2023-11-18 DIAGNOSIS — F4312 Post-traumatic stress disorder, chronic: Secondary | ICD-10-CM | POA: Diagnosis not present

## 2023-11-22 ENCOUNTER — Other Ambulatory Visit (HOSPITAL_COMMUNITY): Payer: Self-pay

## 2023-11-22 DIAGNOSIS — Z789 Other specified health status: Secondary | ICD-10-CM | POA: Diagnosis not present

## 2023-11-22 DIAGNOSIS — K5900 Constipation, unspecified: Secondary | ICD-10-CM | POA: Diagnosis not present

## 2023-11-22 DIAGNOSIS — N898 Other specified noninflammatory disorders of vagina: Secondary | ICD-10-CM | POA: Diagnosis not present

## 2023-11-22 DIAGNOSIS — Z853 Personal history of malignant neoplasm of breast: Secondary | ICD-10-CM | POA: Diagnosis not present

## 2023-11-22 DIAGNOSIS — Z113 Encounter for screening for infections with a predominantly sexual mode of transmission: Secondary | ICD-10-CM | POA: Diagnosis not present

## 2023-11-22 DIAGNOSIS — R102 Pelvic and perineal pain: Secondary | ICD-10-CM | POA: Diagnosis not present

## 2023-11-22 DIAGNOSIS — Z01419 Encounter for gynecological examination (general) (routine) without abnormal findings: Secondary | ICD-10-CM | POA: Diagnosis not present

## 2023-11-22 MED ORDER — ESTRADIOL 0.1 MG/GM VA CREA
0.5000 g | TOPICAL_CREAM | VAGINAL | 3 refills | Status: AC
  Filled 2023-11-22: qty 42.5, 90d supply, fill #0
  Filled 2024-03-29 – 2024-04-11 (×2): qty 42.5, 90d supply, fill #1
  Filled 2024-08-06: qty 42.5, 90d supply, fill #2

## 2023-11-23 DIAGNOSIS — F4312 Post-traumatic stress disorder, chronic: Secondary | ICD-10-CM | POA: Diagnosis not present

## 2023-12-01 ENCOUNTER — Other Ambulatory Visit (HOSPITAL_COMMUNITY): Payer: Self-pay

## 2023-12-01 ENCOUNTER — Other Ambulatory Visit: Payer: Self-pay | Admitting: Hematology and Oncology

## 2023-12-01 ENCOUNTER — Encounter: Payer: Self-pay | Admitting: Physician Assistant

## 2023-12-01 ENCOUNTER — Ambulatory Visit: Payer: Commercial Managed Care - PPO | Admitting: Physician Assistant

## 2023-12-01 DIAGNOSIS — Z630 Problems in relationship with spouse or partner: Secondary | ICD-10-CM

## 2023-12-01 DIAGNOSIS — F4323 Adjustment disorder with mixed anxiety and depressed mood: Secondary | ICD-10-CM

## 2023-12-01 DIAGNOSIS — Z87891 Personal history of nicotine dependence: Secondary | ICD-10-CM | POA: Diagnosis not present

## 2023-12-01 MED ORDER — TAMOXIFEN CITRATE 10 MG PO TABS
10.0000 mg | ORAL_TABLET | Freq: Every day | ORAL | 3 refills | Status: AC
  Filled 2023-12-01: qty 90, 90d supply, fill #0
  Filled 2024-03-29 – 2024-04-11 (×2): qty 90, 90d supply, fill #1
  Filled 2024-08-06: qty 90, 90d supply, fill #2

## 2023-12-01 NOTE — Progress Notes (Signed)
[facility]  Patient ID: Jennifer Davis,  MRN: [id number]  PCP: Georgann Housekeeper, MD  Date of Evaluation: 12/01/2023 Time spent:20 minutes   Chief Complaint:  Chief Complaint   Anxiety; Depression; Insomnia; Follow-up    HISTORY/CURRENT STATUS: HPI For routine f/u.   Feels like her meds are working well. Husband moved back in which has been an adjustment period. She is able to enjoy things.  Energy and motivation are good.  Work is going well.   No extreme sadness, tearfulness, or feelings of hopelessness.  Sleeps well most of the time. The Buspar helps her sleep so she doesn't need anything else to help sleep.  ADLs and personal hygiene are normal.   Denies any changes in concentration, making decisions, or remembering things.  Appetite has not changed.  Weight is stable. No anxiety, for the most part. The Buspar helps prevent it.  Denies suicidal or homicidal thoughts.  Patient denies increased energy with decreased need for sleep, increased talkativeness, racing thoughts, impulsivity or risky behaviors, increased spending, increased libido, grandiosity, increased irritability or anger, paranoia, or hallucinations.  Denies dizziness, syncope, seizures, numbness, tingling, tremor, tics, unsteady gait, slurred speech, confusion. Denies muscle or joint pain, stiffness, or dystonia.  Individual Medical History/ Review of Systems: Changes? :Yes  had the flu since LOV   Past medications for mental health diagnoses include: Lexapro, Prozac wasn't effective, Xanax, Buspar worked but d/c when didn't need it.  Allergies: Bee venom, Dog epithelium (canis lupus familiaris), Dust mite extract, Grass pollen(k-o-r-t-swt vern), Other, and Pollen extract  Current Medications:  Current Outpatient Medications:    albuterol (PROVENTIL) (2.5 MG/3ML) 0.083% nebulizer solution, Take 3 mLs (2.5 mg total) by nebulization every 6 (six) hours as needed for wheezing or shortness of breath., Disp: 75  mL, Rfl: 1   albuterol (VENTOLIN HFA) 108 (90 Base) MCG/ACT inhaler, Inhale 2 puffs into the lungs every 4-6 hours as needed., Disp: 6.7 g, Rfl: 0   budesonide-formoterol (SYMBICORT) 160-4.5 MCG/ACT inhaler, Inhale 2 puffs into the lungs 2 (two) times daily., Disp: 10.2 g, Rfl: 5   busPIRone (BUSPAR) 10 MG tablet, Take 1 tablet (10 mg total) by mouth 2 (two) times daily., Disp: 180 tablet, Rfl: 1   cetirizine (ZYRTEC) 10 MG tablet, Take 10 mg by mouth daily., Disp: , Rfl:    Cholecalciferol (VITAMIN D) 50 MCG (2000 UT) tablet, Take 1 tablet (2,000 Units total) by mouth daily., Disp: , Rfl:    diphenhydrAMINE (BENADRYL) 25 MG tablet, Take 50 mg by mouth every 6 (six) hours as needed (hives)., Disp: , Rfl:    estradiol (ESTRACE) 0.1 MG/GM vaginal cream, Insert 0.5 grams vaginally 2 (two) times a week as directed, Disp: 42.5 g, Rfl: 6   estradiol (ESTRACE) 0.1 MG/GM vaginal cream, Place 0.5 g vaginally 2 (two) times a week., Disp: 42.5 g, Rfl: 3   fluticasone (FLONASE) 50 MCG/ACT nasal spray, Place 2 sprays into both nostrils daily., Disp: 16 g, Rfl: 0   gabapentin (NEURONTIN) 100 MG capsule, Take 1 capsule (100 mg total) by mouth 3 (three) times daily., Disp: 270 capsule, Rfl: 0   ibuprofen (ADVIL) 200 MG tablet, Take 400 mg by mouth every 6 (six) hours as needed for moderate pain., Disp: , Rfl:    melatonin 5 MG TABS, Take 5 mg by mouth at bedtime as needed (sleep)., Disp: , Rfl:    montelukast (SINGULAIR) 10 MG tablet, Take 1 tablet (10 mg total) by mouth at bedtime., Disp: 90 tablet,  Rfl: 0   Multiple Vitamin (MULTI VITAMIN) TABS, 1 tablet Orally Once a day, Disp: , Rfl:    Probiotic Product ([employer] PROBIOTIC PO), Take 1 capsule by mouth daily., Disp: , Rfl:    tamoxifen (NOLVADEX) 10 MG tablet, Take 1 tablet (10 mg total) by mouth daily., Disp: 90 tablet, Rfl: 3   tirzepatide (MOUNJARO) 7.5 MG/0.5ML Pen, Inject 7.5 mg into the skin once a week., Disp: 2 mL, Rfl: 3   venlafaxine XR (EFFEXOR-XR) 150  MG 24 hr capsule, Take 1 capsule (150 mg total) by mouth daily with breakfast., Disp: 90 capsule, Rfl: 1   EPINEPHrine 0.3 mg/0.3 mL IJ SOAJ injection, Use as directed for anaphylaxis (Patient not taking: Reported on 12/01/2023), Disp: 2 each, Rfl: 1   promethazine-dextromethorphan (PROMETHAZINE-DM) 6.25-15 MG/5ML syrup, Take 5 mLs by mouth 4 (four) times daily as needed for cough. (Patient not taking: Reported on 12/01/2023), Disp: 118 mL, Rfl: 0   tirzepatide (MOUNJARO) 5 MG/0.5ML Pen, Inject 5 mg into the skin once a week. (Patient not taking: Reported on 12/01/2023), Disp: 6 mL, Rfl: 0 Medication Side Effects: none  Family Medical/ Social History: Changes? See HPI  MENTAL HEALTH EXAM:  Last menstrual period 12/10/2020.There is no height or weight on file to calculate BMI.  General Appearance: Casual and Well Groomed  Eye Contact:  Good  Speech:  Clear and Coherent and Normal Rate  Volume:  Normal  Mood:  Euthymic  Affect:  Congruent  Thought Process:  Goal Directed and Descriptions of Associations: Circumstantial  Orientation:  Full (Time, Place, and Person)  Thought Content: Logical   Suicidal Thoughts:  No  Homicidal Thoughts:  No  Memory:  WNL  Judgement:  Good  Insight:  Good  Psychomotor Activity:  Normal  Concentration:  Concentration: Good and Attention Span: Good  Recall:  Good  Fund of Knowledge: Good  Language: Good  Assets:  Communication Skills Desire for Improvement Financial Resources/Insurance Housing Physical Health Resilience Transportation Vocational/Educational  ADL's:  Intact  Cognition: WNL  Prognosis:  Good   DIAGNOSES:    ICD-10-CM   1. Situational mixed anxiety and depressive disorder  F43.23     2. Former smoker  Z87.891     3. Marital stress  Z63.0      Receiving Psychotherapy: Yes  [facility] at [facility]  RECOMMENDATIONS:  PDMP reviewed.  Gabapentin filled 03/19/2023. I provided 20 minutes of face to face time during this encounter,  including time spent before and after the visit in records review, medical decision making, counseling pertinent to today's visit, and charting.   She's doing well as far as her meds go so no changes will be made.   Continue Buspar 10 mg bid.  Continue gabapentin 100 mg, 1 p.o. 3 times daily as needed. Continue melatonin 5 mg nightly as needed sleep. Continue Effexor XR 150 mg, 1 p.o. daily. Continue therapy both individual and marriage counseling. Return in 6 months.  Melony Overly, PA-C

## 2023-12-03 DIAGNOSIS — F4312 Post-traumatic stress disorder, chronic: Secondary | ICD-10-CM | POA: Diagnosis not present

## 2023-12-14 DIAGNOSIS — F4312 Post-traumatic stress disorder, chronic: Secondary | ICD-10-CM | POA: Diagnosis not present

## 2023-12-22 ENCOUNTER — Other Ambulatory Visit: Payer: Self-pay

## 2023-12-22 ENCOUNTER — Other Ambulatory Visit (HOSPITAL_COMMUNITY): Payer: Self-pay

## 2023-12-22 ENCOUNTER — Other Ambulatory Visit: Payer: Self-pay | Admitting: Physician Assistant

## 2023-12-22 MED ORDER — VENLAFAXINE HCL ER 150 MG PO CP24
150.0000 mg | ORAL_CAPSULE | Freq: Every day | ORAL | 1 refills | Status: DC
  Filled 2023-12-22 – 2024-02-09 (×3): qty 90, 90d supply, fill #0
  Filled 2024-05-04: qty 90, 90d supply, fill #1

## 2023-12-24 DIAGNOSIS — H5213 Myopia, bilateral: Secondary | ICD-10-CM | POA: Diagnosis not present

## 2023-12-24 DIAGNOSIS — F4312 Post-traumatic stress disorder, chronic: Secondary | ICD-10-CM | POA: Diagnosis not present

## 2023-12-27 ENCOUNTER — Other Ambulatory Visit (HOSPITAL_COMMUNITY): Payer: Self-pay

## 2023-12-28 ENCOUNTER — Other Ambulatory Visit: Payer: Self-pay

## 2023-12-28 ENCOUNTER — Other Ambulatory Visit (HOSPITAL_COMMUNITY): Payer: Self-pay

## 2023-12-28 ENCOUNTER — Other Ambulatory Visit (HOSPITAL_BASED_OUTPATIENT_CLINIC_OR_DEPARTMENT_OTHER): Payer: Self-pay

## 2023-12-28 DIAGNOSIS — E1169 Type 2 diabetes mellitus with other specified complication: Secondary | ICD-10-CM | POA: Diagnosis not present

## 2023-12-28 DIAGNOSIS — E782 Mixed hyperlipidemia: Secondary | ICD-10-CM | POA: Diagnosis not present

## 2023-12-28 DIAGNOSIS — E663 Overweight: Secondary | ICD-10-CM | POA: Diagnosis not present

## 2023-12-28 DIAGNOSIS — Z6825 Body mass index (BMI) 25.0-25.9, adult: Secondary | ICD-10-CM | POA: Diagnosis not present

## 2023-12-28 DIAGNOSIS — E559 Vitamin D deficiency, unspecified: Secondary | ICD-10-CM | POA: Diagnosis not present

## 2023-12-28 DIAGNOSIS — Z8639 Personal history of other endocrine, nutritional and metabolic disease: Secondary | ICD-10-CM | POA: Diagnosis not present

## 2023-12-28 DIAGNOSIS — K5903 Drug induced constipation: Secondary | ICD-10-CM | POA: Diagnosis not present

## 2023-12-28 DIAGNOSIS — F4312 Post-traumatic stress disorder, chronic: Secondary | ICD-10-CM | POA: Diagnosis not present

## 2023-12-28 MED ORDER — MOUNJARO 7.5 MG/0.5ML ~~LOC~~ SOAJ
7.5000 mg | SUBCUTANEOUS | 0 refills | Status: DC
Start: 2023-12-27 — End: 2024-01-21
  Filled 2023-12-28 (×2): qty 2, 28d supply, fill #0

## 2024-01-03 DIAGNOSIS — F4312 Post-traumatic stress disorder, chronic: Secondary | ICD-10-CM | POA: Diagnosis not present

## 2024-01-10 DIAGNOSIS — F4312 Post-traumatic stress disorder, chronic: Secondary | ICD-10-CM | POA: Diagnosis not present

## 2024-01-19 ENCOUNTER — Other Ambulatory Visit (HOSPITAL_COMMUNITY): Payer: Self-pay

## 2024-01-19 DIAGNOSIS — J3081 Allergic rhinitis due to animal (cat) (dog) hair and dander: Secondary | ICD-10-CM | POA: Diagnosis not present

## 2024-01-19 DIAGNOSIS — J4540 Moderate persistent asthma, uncomplicated: Secondary | ICD-10-CM | POA: Diagnosis not present

## 2024-01-19 DIAGNOSIS — J3089 Other allergic rhinitis: Secondary | ICD-10-CM | POA: Diagnosis not present

## 2024-01-19 DIAGNOSIS — J301 Allergic rhinitis due to pollen: Secondary | ICD-10-CM | POA: Diagnosis not present

## 2024-01-19 MED ORDER — OLOPATADINE HCL 0.6 % NA SOLN
2.0000 | Freq: Two times a day (BID) | NASAL | 5 refills | Status: DC
  Filled 2024-01-19: qty 30.5, 30d supply, fill #0
  Filled 2024-03-29 – 2024-04-11 (×2): qty 30.5, 30d supply, fill #1

## 2024-01-19 MED ORDER — MONTELUKAST SODIUM 10 MG PO TABS
10.0000 mg | ORAL_TABLET | Freq: Every evening | ORAL | 5 refills | Status: DC
  Filled 2024-01-19: qty 90, 90d supply, fill #0
  Filled 2024-05-04: qty 90, 90d supply, fill #1

## 2024-01-20 ENCOUNTER — Other Ambulatory Visit (HOSPITAL_COMMUNITY): Payer: Self-pay

## 2024-01-20 DIAGNOSIS — F4312 Post-traumatic stress disorder, chronic: Secondary | ICD-10-CM | POA: Diagnosis not present

## 2024-01-21 ENCOUNTER — Other Ambulatory Visit (HOSPITAL_COMMUNITY): Payer: Self-pay

## 2024-01-21 MED ORDER — MOUNJARO 7.5 MG/0.5ML ~~LOC~~ SOAJ
7.5000 mg | SUBCUTANEOUS | 0 refills | Status: DC
  Filled 2024-01-21: qty 2, 28d supply, fill #0

## 2024-02-01 DIAGNOSIS — J4540 Moderate persistent asthma, uncomplicated: Secondary | ICD-10-CM | POA: Diagnosis not present

## 2024-02-01 DIAGNOSIS — J3081 Allergic rhinitis due to animal (cat) (dog) hair and dander: Secondary | ICD-10-CM | POA: Diagnosis not present

## 2024-02-01 DIAGNOSIS — J3089 Other allergic rhinitis: Secondary | ICD-10-CM | POA: Diagnosis not present

## 2024-02-01 DIAGNOSIS — J301 Allergic rhinitis due to pollen: Secondary | ICD-10-CM | POA: Diagnosis not present

## 2024-02-04 ENCOUNTER — Other Ambulatory Visit: Payer: Self-pay

## 2024-02-04 ENCOUNTER — Other Ambulatory Visit (HOSPITAL_COMMUNITY): Payer: Self-pay

## 2024-02-04 MED ORDER — BUDESONIDE-FORMOTEROL FUMARATE 160-4.5 MCG/ACT IN AERO
2.0000 | INHALATION_SPRAY | Freq: Two times a day (BID) | RESPIRATORY_TRACT | 2 refills | Status: DC
Start: 2024-02-04 — End: 2024-03-29
  Filled 2024-02-04 – 2024-02-09 (×2): qty 10.2, 30d supply, fill #0
  Filled 2024-03-29: qty 10.2, 30d supply, fill #1

## 2024-02-09 ENCOUNTER — Other Ambulatory Visit (HOSPITAL_COMMUNITY): Payer: Self-pay

## 2024-02-09 ENCOUNTER — Other Ambulatory Visit: Payer: Self-pay

## 2024-02-09 DIAGNOSIS — J3081 Allergic rhinitis due to animal (cat) (dog) hair and dander: Secondary | ICD-10-CM | POA: Diagnosis not present

## 2024-02-09 DIAGNOSIS — J3089 Other allergic rhinitis: Secondary | ICD-10-CM | POA: Diagnosis not present

## 2024-02-09 DIAGNOSIS — J301 Allergic rhinitis due to pollen: Secondary | ICD-10-CM | POA: Diagnosis not present

## 2024-02-12 DIAGNOSIS — F4312 Post-traumatic stress disorder, chronic: Secondary | ICD-10-CM | POA: Diagnosis not present

## 2024-02-14 DIAGNOSIS — F4312 Post-traumatic stress disorder, chronic: Secondary | ICD-10-CM | POA: Diagnosis not present

## 2024-02-15 ENCOUNTER — Ambulatory Visit: Payer: Commercial Managed Care - PPO | Admitting: Plastic Surgery

## 2024-02-21 DIAGNOSIS — E1169 Type 2 diabetes mellitus with other specified complication: Secondary | ICD-10-CM | POA: Diagnosis not present

## 2024-02-21 DIAGNOSIS — E782 Mixed hyperlipidemia: Secondary | ICD-10-CM | POA: Diagnosis not present

## 2024-02-21 DIAGNOSIS — Z8639 Personal history of other endocrine, nutritional and metabolic disease: Secondary | ICD-10-CM | POA: Diagnosis not present

## 2024-02-21 DIAGNOSIS — E663 Overweight: Secondary | ICD-10-CM | POA: Diagnosis not present

## 2024-02-21 DIAGNOSIS — K5903 Drug induced constipation: Secondary | ICD-10-CM | POA: Diagnosis not present

## 2024-02-21 DIAGNOSIS — Z6825 Body mass index (BMI) 25.0-25.9, adult: Secondary | ICD-10-CM | POA: Diagnosis not present

## 2024-02-24 ENCOUNTER — Other Ambulatory Visit (HOSPITAL_COMMUNITY): Payer: Self-pay

## 2024-02-26 DIAGNOSIS — F4312 Post-traumatic stress disorder, chronic: Secondary | ICD-10-CM | POA: Diagnosis not present

## 2024-02-29 ENCOUNTER — Other Ambulatory Visit: Payer: Self-pay

## 2024-02-29 ENCOUNTER — Other Ambulatory Visit (HOSPITAL_COMMUNITY): Payer: Self-pay

## 2024-02-29 MED ORDER — MOUNJARO 7.5 MG/0.5ML ~~LOC~~ SOAJ
7.5000 mg | SUBCUTANEOUS | 3 refills | Status: AC
  Filled 2024-02-29: qty 2, 28d supply, fill #0

## 2024-03-13 ENCOUNTER — Other Ambulatory Visit (HOSPITAL_COMMUNITY): Payer: Self-pay

## 2024-03-13 DIAGNOSIS — F4312 Post-traumatic stress disorder, chronic: Secondary | ICD-10-CM | POA: Diagnosis not present

## 2024-03-22 ENCOUNTER — Other Ambulatory Visit (HOSPITAL_COMMUNITY): Payer: Self-pay

## 2024-03-22 DIAGNOSIS — Z6826 Body mass index (BMI) 26.0-26.9, adult: Secondary | ICD-10-CM | POA: Diagnosis not present

## 2024-03-22 DIAGNOSIS — E1169 Type 2 diabetes mellitus with other specified complication: Secondary | ICD-10-CM | POA: Diagnosis not present

## 2024-03-22 DIAGNOSIS — Z8639 Personal history of other endocrine, nutritional and metabolic disease: Secondary | ICD-10-CM | POA: Diagnosis not present

## 2024-03-22 DIAGNOSIS — K219 Gastro-esophageal reflux disease without esophagitis: Secondary | ICD-10-CM | POA: Diagnosis not present

## 2024-03-22 DIAGNOSIS — E782 Mixed hyperlipidemia: Secondary | ICD-10-CM | POA: Diagnosis not present

## 2024-03-22 DIAGNOSIS — J301 Allergic rhinitis due to pollen: Secondary | ICD-10-CM | POA: Diagnosis not present

## 2024-03-22 DIAGNOSIS — T63441S Toxic effect of venom of bees, accidental (unintentional), sequela: Secondary | ICD-10-CM | POA: Diagnosis not present

## 2024-03-22 DIAGNOSIS — E663 Overweight: Secondary | ICD-10-CM | POA: Diagnosis not present

## 2024-03-22 DIAGNOSIS — J3081 Allergic rhinitis due to animal (cat) (dog) hair and dander: Secondary | ICD-10-CM | POA: Diagnosis not present

## 2024-03-22 DIAGNOSIS — L299 Pruritus, unspecified: Secondary | ICD-10-CM | POA: Diagnosis not present

## 2024-03-22 DIAGNOSIS — K5903 Drug induced constipation: Secondary | ICD-10-CM | POA: Diagnosis not present

## 2024-03-22 DIAGNOSIS — J3089 Other allergic rhinitis: Secondary | ICD-10-CM | POA: Diagnosis not present

## 2024-03-22 MED ORDER — MOUNJARO 10 MG/0.5ML ~~LOC~~ SOAJ
10.0000 mg | SUBCUTANEOUS | 0 refills | Status: DC
  Filled 2024-03-22 – 2024-04-18 (×2): qty 2, 28d supply, fill #0

## 2024-03-23 DIAGNOSIS — J301 Allergic rhinitis due to pollen: Secondary | ICD-10-CM | POA: Diagnosis not present

## 2024-03-23 DIAGNOSIS — T63441D Toxic effect of venom of bees, accidental (unintentional), subsequent encounter: Secondary | ICD-10-CM | POA: Diagnosis not present

## 2024-03-23 DIAGNOSIS — J3081 Allergic rhinitis due to animal (cat) (dog) hair and dander: Secondary | ICD-10-CM | POA: Diagnosis not present

## 2024-03-23 DIAGNOSIS — T63451D Toxic effect of venom of hornets, accidental (unintentional), subsequent encounter: Secondary | ICD-10-CM | POA: Diagnosis not present

## 2024-03-23 DIAGNOSIS — T63441S Toxic effect of venom of bees, accidental (unintentional), sequela: Secondary | ICD-10-CM | POA: Diagnosis not present

## 2024-03-23 DIAGNOSIS — J3089 Other allergic rhinitis: Secondary | ICD-10-CM | POA: Diagnosis not present

## 2024-03-28 DIAGNOSIS — F4312 Post-traumatic stress disorder, chronic: Secondary | ICD-10-CM | POA: Diagnosis not present

## 2024-03-29 ENCOUNTER — Other Ambulatory Visit (HOSPITAL_COMMUNITY): Payer: Self-pay

## 2024-03-29 ENCOUNTER — Other Ambulatory Visit: Payer: Self-pay

## 2024-03-29 MED ORDER — EPINEPHRINE 0.3 MG/0.3ML IJ SOAJ
0.3000 mg | INTRAMUSCULAR | 1 refills | Status: AC | PRN
  Filled 2024-03-29: qty 2, 30d supply, fill #0
  Filled 2024-04-11: qty 2, 2d supply, fill #0

## 2024-03-29 MED ORDER — EPINEPHRINE 0.3 MG/0.3ML IJ SOAJ
0.3000 mg | INTRAMUSCULAR | 1 refills | Status: DC | PRN
  Filled 2024-03-29: qty 2, 30d supply, fill #0

## 2024-03-29 MED ORDER — BUDESONIDE-FORMOTEROL FUMARATE 160-4.5 MCG/ACT IN AERO
2.0000 | INHALATION_SPRAY | Freq: Two times a day (BID) | RESPIRATORY_TRACT | 2 refills | Status: DC
  Filled 2024-03-29 – 2024-04-11 (×2): qty 10.2, 30d supply, fill #0
  Filled 2024-05-11: qty 10.2, 30d supply, fill #1
  Filled 2024-08-06: qty 10.2, 30d supply, fill #2

## 2024-03-29 MED ORDER — EPINEPHRINE 0.3 MG/0.3ML IJ SOAJ
0.3000 mg | Freq: Every day | INTRAMUSCULAR | 1 refills | Status: AC
  Filled 2024-03-29: qty 2, 2d supply, fill #0
  Filled 2024-05-27: qty 2, 14d supply, fill #0

## 2024-04-04 DIAGNOSIS — F4312 Post-traumatic stress disorder, chronic: Secondary | ICD-10-CM | POA: Diagnosis not present

## 2024-04-10 ENCOUNTER — Other Ambulatory Visit (HOSPITAL_COMMUNITY): Payer: Self-pay

## 2024-04-11 ENCOUNTER — Other Ambulatory Visit (HOSPITAL_COMMUNITY): Payer: Self-pay

## 2024-04-11 ENCOUNTER — Other Ambulatory Visit: Payer: Self-pay

## 2024-04-11 DIAGNOSIS — R519 Headache, unspecified: Secondary | ICD-10-CM | POA: Diagnosis not present

## 2024-04-11 MED ORDER — PREDNISONE 10 MG (21) PO TBPK
ORAL_TABLET | ORAL | 0 refills | Status: AC
  Filled 2024-04-11: qty 21, 6d supply, fill #0

## 2024-04-18 ENCOUNTER — Other Ambulatory Visit (HOSPITAL_COMMUNITY): Payer: Self-pay

## 2024-04-18 ENCOUNTER — Other Ambulatory Visit: Payer: Self-pay

## 2024-04-18 DIAGNOSIS — F4312 Post-traumatic stress disorder, chronic: Secondary | ICD-10-CM | POA: Diagnosis not present

## 2024-04-19 ENCOUNTER — Encounter: Payer: Self-pay | Admitting: Neurology

## 2024-04-19 DIAGNOSIS — E782 Mixed hyperlipidemia: Secondary | ICD-10-CM | POA: Diagnosis not present

## 2024-04-19 DIAGNOSIS — Z6825 Body mass index (BMI) 25.0-25.9, adult: Secondary | ICD-10-CM | POA: Diagnosis not present

## 2024-04-19 DIAGNOSIS — E1169 Type 2 diabetes mellitus with other specified complication: Secondary | ICD-10-CM | POA: Diagnosis not present

## 2024-04-19 DIAGNOSIS — E663 Overweight: Secondary | ICD-10-CM | POA: Diagnosis not present

## 2024-04-19 DIAGNOSIS — J301 Allergic rhinitis due to pollen: Secondary | ICD-10-CM | POA: Diagnosis not present

## 2024-04-19 DIAGNOSIS — J3081 Allergic rhinitis due to animal (cat) (dog) hair and dander: Secondary | ICD-10-CM | POA: Diagnosis not present

## 2024-04-19 DIAGNOSIS — J3089 Other allergic rhinitis: Secondary | ICD-10-CM | POA: Diagnosis not present

## 2024-04-19 DIAGNOSIS — Z8639 Personal history of other endocrine, nutritional and metabolic disease: Secondary | ICD-10-CM | POA: Diagnosis not present

## 2024-04-21 DIAGNOSIS — T63441D Toxic effect of venom of bees, accidental (unintentional), subsequent encounter: Secondary | ICD-10-CM | POA: Diagnosis not present

## 2024-04-21 DIAGNOSIS — T63461D Toxic effect of venom of wasps, accidental (unintentional), subsequent encounter: Secondary | ICD-10-CM | POA: Diagnosis not present

## 2024-04-21 DIAGNOSIS — T63451D Toxic effect of venom of hornets, accidental (unintentional), subsequent encounter: Secondary | ICD-10-CM | POA: Diagnosis not present

## 2024-04-28 DIAGNOSIS — T63451D Toxic effect of venom of hornets, accidental (unintentional), subsequent encounter: Secondary | ICD-10-CM | POA: Diagnosis not present

## 2024-04-28 DIAGNOSIS — F4312 Post-traumatic stress disorder, chronic: Secondary | ICD-10-CM | POA: Diagnosis not present

## 2024-04-28 DIAGNOSIS — T63441D Toxic effect of venom of bees, accidental (unintentional), subsequent encounter: Secondary | ICD-10-CM | POA: Diagnosis not present

## 2024-04-28 DIAGNOSIS — T63461D Toxic effect of venom of wasps, accidental (unintentional), subsequent encounter: Secondary | ICD-10-CM | POA: Diagnosis not present

## 2024-05-02 DIAGNOSIS — F4312 Post-traumatic stress disorder, chronic: Secondary | ICD-10-CM | POA: Diagnosis not present

## 2024-05-09 ENCOUNTER — Other Ambulatory Visit (HOSPITAL_COMMUNITY): Payer: Self-pay

## 2024-05-09 DIAGNOSIS — Z23 Encounter for immunization: Secondary | ICD-10-CM | POA: Diagnosis not present

## 2024-05-09 DIAGNOSIS — E1169 Type 2 diabetes mellitus with other specified complication: Secondary | ICD-10-CM | POA: Diagnosis not present

## 2024-05-09 DIAGNOSIS — Z1389 Encounter for screening for other disorder: Secondary | ICD-10-CM | POA: Diagnosis not present

## 2024-05-09 DIAGNOSIS — J309 Allergic rhinitis, unspecified: Secondary | ICD-10-CM | POA: Diagnosis not present

## 2024-05-09 DIAGNOSIS — J4540 Moderate persistent asthma, uncomplicated: Secondary | ICD-10-CM | POA: Diagnosis not present

## 2024-05-09 DIAGNOSIS — Z9013 Acquired absence of bilateral breasts and nipples: Secondary | ICD-10-CM | POA: Diagnosis not present

## 2024-05-09 DIAGNOSIS — F4312 Post-traumatic stress disorder, chronic: Secondary | ICD-10-CM | POA: Diagnosis not present

## 2024-05-09 DIAGNOSIS — F411 Generalized anxiety disorder: Secondary | ICD-10-CM | POA: Diagnosis not present

## 2024-05-09 DIAGNOSIS — E559 Vitamin D deficiency, unspecified: Secondary | ICD-10-CM | POA: Diagnosis not present

## 2024-05-09 DIAGNOSIS — E782 Mixed hyperlipidemia: Secondary | ICD-10-CM | POA: Diagnosis not present

## 2024-05-09 DIAGNOSIS — Z0000 Encounter for general adult medical examination without abnormal findings: Secondary | ICD-10-CM | POA: Diagnosis not present

## 2024-05-09 MED ORDER — ROSUVASTATIN CALCIUM 5 MG PO TABS
5.0000 mg | ORAL_TABLET | Freq: Every day | ORAL | 5 refills | Status: AC
  Filled 2024-05-09: qty 90, 90d supply, fill #0
  Filled 2024-08-08: qty 90, 90d supply, fill #1

## 2024-05-11 ENCOUNTER — Other Ambulatory Visit: Payer: Self-pay | Admitting: Physician Assistant

## 2024-05-11 ENCOUNTER — Other Ambulatory Visit: Payer: Self-pay

## 2024-05-14 MED ORDER — BUSPIRONE HCL 10 MG PO TABS
10.0000 mg | ORAL_TABLET | Freq: Two times a day (BID) | ORAL | 1 refills | Status: DC
  Filled 2024-05-14: qty 180, 90d supply, fill #0
  Filled 2024-08-09: qty 180, 90d supply, fill #1

## 2024-05-15 ENCOUNTER — Other Ambulatory Visit (HOSPITAL_COMMUNITY): Payer: Self-pay

## 2024-05-15 DIAGNOSIS — F4312 Post-traumatic stress disorder, chronic: Secondary | ICD-10-CM | POA: Diagnosis not present

## 2024-05-16 ENCOUNTER — Other Ambulatory Visit (HOSPITAL_COMMUNITY): Payer: Self-pay

## 2024-05-16 MED ORDER — MOUNJARO 10 MG/0.5ML ~~LOC~~ SOAJ
10.0000 mg | SUBCUTANEOUS | 0 refills | Status: DC
  Filled 2024-05-16: qty 2, 28d supply, fill #0

## 2024-05-17 ENCOUNTER — Other Ambulatory Visit (HOSPITAL_COMMUNITY): Payer: Self-pay

## 2024-05-18 ENCOUNTER — Other Ambulatory Visit (HOSPITAL_COMMUNITY): Payer: Self-pay

## 2024-05-23 ENCOUNTER — Ambulatory Visit: Payer: Commercial Managed Care - PPO | Admitting: Hematology and Oncology

## 2024-05-24 DIAGNOSIS — Z6824 Body mass index (BMI) 24.0-24.9, adult: Secondary | ICD-10-CM | POA: Diagnosis not present

## 2024-05-24 DIAGNOSIS — E782 Mixed hyperlipidemia: Secondary | ICD-10-CM | POA: Diagnosis not present

## 2024-05-24 DIAGNOSIS — E1169 Type 2 diabetes mellitus with other specified complication: Secondary | ICD-10-CM | POA: Diagnosis not present

## 2024-05-24 DIAGNOSIS — Z8639 Personal history of other endocrine, nutritional and metabolic disease: Secondary | ICD-10-CM | POA: Diagnosis not present

## 2024-05-25 DIAGNOSIS — F4312 Post-traumatic stress disorder, chronic: Secondary | ICD-10-CM | POA: Diagnosis not present

## 2024-05-25 NOTE — Progress Notes (Deleted)
 NEUROLOGY CONSULTATION NOTE  [name] MRN: [id number] DOB: 1976/02/08  Referring provider: Ardell Manly, MD Primary care provider: Ardell Manly, MD  Reason for consult:  worsening headaches  Assessment/Plan:   ***   Subjective:  Jennifer Davis is a 48 year old ***-handed female with asthma, DM 2, depression, anxiety, and history of breast cancer who presents for worsening headache.  History supplemented by referring provider's note.  Onset:  *** Location:  *** Quality:  *** Intensity:  ***.  *** denies new headache, thunderclap headache or severe headache that wakes *** from sleep. Aura:  *** Prodrome:  *** Postdrome:  *** Associated symptoms:  ***.  *** denies associated unilateral numbness or weakness. Duration:  *** Frequency:  *** Frequency of abortive medication: *** Triggers:  *** Relieving factors:  *** Activity:  ***  Past NSAIDS/analgesics:  *** Past abortive triptans:  *** Past abortive ergotamine:  none Past muscle relaxants:  none Past anti-emetic:  Zofran , Compazine  Past antihypertensive medications:  *** Past antidepressant medications:  duloxetine , escitalopram  Past anticonvulsant medications:  *** Past anti-CGRP:  *** Past vitamins/Herbal/Supplements:  none Past antihistamines/decongestants:  loratadine  Other past therapies:  ***  Current NSAIDS/analgesics:  *** Current triptans:  none Current ergotamine:  none Current anti-emetic:  none Current muscle relaxants:  none Current Antihypertensive medications:  none Current Antidepressant medications:  venlafaxine  XR 150mg  Current Anticonvulsant medications:  gabapentin  100mg  TID PRN Current anti-CGRP:  none Current Vitamins/Herbal/Supplements:  D3, MVI Current Antihistamines/Decongestants:  Zyrtec Other therapy:  none Birth control/hormone:  estradiol  Other medications:  buspirone , Mounjaro , tamoxifen    Caffeine:  *** Alcohol:  *** Smoker:  *** Diet:  *** Exercise:   *** Depression:  ***; Anxiety:  *** Other pain:  *** Sleep hygiene:  *** Family history of headache:  ***      PAST MEDICAL HISTORY: Past Medical History:  Diagnosis Date   Anxiety    Asthma    Asthma due to seasonal allergies    Back pain    Breast cancer (HCC)    Constipation    Depression    Diabetes mellitus without complication (HCC)    Dyspnea    after chemo - fatiqued   Fluid retention    GERD (gastroesophageal reflux disease)    occasionl   History of radiation therapy    left chest wall and subclavian 07/09/2021-08/22/2021   Dr Lynwood Nasuti   Joint pain    Lower extremity edema    Morbid obesity (HCC)    Palpitations 2017   tachy    Palpitations 01/09/2016   Prediabetes    PTSD (post-traumatic stress disorder)    SOB (shortness of breath)    Swallowing difficulty     PAST SURGICAL HISTORY: Past Surgical History:  Procedure Laterality Date   BREAST RECONSTRUCTION WITH PLACEMENT OF TISSUE EXPANDER AND FLEX HD (ACELLULAR HYDRATED DERMIS) Bilateral 12/04/2020   Procedure: IMMEDIATE BILATERAL BREAST RECONSTRUCTION WITH PLACEMENT OF TISSUE EXPANDER AND FLEX HD (ACELLULAR HYDRATED DERMIS);  Surgeon: Lowery Estefana RAMAN, DO;  Location:  SURGERY CENTER;  Service: Plastics;  Laterality: Bilateral;   BREAST RECONSTRUCTION WITH PLACEMENT OF TISSUE EXPANDER AND FLEX HD (ACELLULAR HYDRATED DERMIS) Right 09/28/2022   Procedure: BREAST RECONSTRUCTION WITH PLACEMENT OF TISSUE EXPANDER AND FLEX HD (ACELLULAR HYDRATED DERMIS);  Surgeon: Lowery Estefana RAMAN, DO;  Location: [facility];  Service: Plastics;  Laterality: Right;   CESAREAN SECTION     x3   DEBRIDEMENT AND CLOSURE WOUND Right 04/10/2021   Procedure: excision of right  breast wound with closure;  Surgeon: Lowery Estefana RAMAN, DO;  Location: [facility];  Service: Plastics;  Laterality: Right;   MASTECTOMY WITH AXILLARY LYMPH NODE DISSECTION Left 12/04/2020   Procedure: BILATERAL MASTECTOMIES,  RADIOACTIVE SEED GUIDED TARGETED LEFT AXILLARY NODE DISSECTION;  Surgeon: Vernetta Berg, MD;  Location: [facility];  Service: General;  Laterality: Left;   PORT-A-CATH REMOVAL Left 08/27/2021   Procedure: REMOVAL PORT-A-CATH;  Surgeon: Vernetta Berg, MD;  Location: [facility];  Service: General;  Laterality: Left;   PORTACATH PLACEMENT Left 12/04/2020   Procedure: INSERTION PORT-A-CATH LEFT SUBCLAVIAN;  Surgeon: Vernetta Berg, MD;  Location: [facility];  Service: General;  Laterality: Left;   REMOVAL OF TISSUE EXPANDER AND PLACEMENT OF IMPLANT Right 01/04/2021   Procedure: REMOVAL OF TISSUE EXPANDER AND PLACEMENT OF NEW EXPANDER;  Surgeon: Lowery Estefana RAMAN, DO;  Location: [facility];  Service: Plastics;  Laterality: Right;   REMOVAL OF TISSUE EXPANDER AND PLACEMENT OF IMPLANT Left 09/28/2022   Procedure: REMOVAL OF TISSUE EXPANDER AND REPLACEMENT OF EXPANDER;  Surgeon: Lowery Estefana RAMAN, DO;  Location: [facility];  Service: Plastics;  Laterality: Left;   REMOVAL OF TISSUE EXPANDER AND PLACEMENT OF IMPLANT Bilateral 02/24/2023   Procedure: REMOVAL OF TISSUE EXPANDER AND PLACEMENT OF IMPLANT;  Surgeon: Lowery Estefana RAMAN, DO;  Location: [facility];  Service: Plastics;  Laterality: Bilateral;   TISSUE EXPANDER PLACEMENT Right 03/19/2021   Procedure: Removal of right tissue expander;  Surgeon: Lowery Estefana RAMAN, DO;  Location: [facility];  Service: Plastics;  Laterality: Right;  45 min    MEDICATIONS: Current Outpatient Medications on File Prior to Visit  Medication Sig Dispense Refill   albuterol  (PROVENTIL ) (2.5 MG/3ML) 0.083% nebulizer solution Take 3 mLs (2.5 mg total) by nebulization every 6 (six) hours as needed for wheezing or shortness of breath. 75 mL 1   albuterol  (VENTOLIN  HFA) 108 (90 Base) MCG/ACT inhaler Inhale 2 puffs into the lungs every 4-6 hours as needed. 6.7 g 0   budesonide -formoterol  (SYMBICORT ) 160-4.5 MCG/ACT  inhaler Inhale 2 puffs into the lungs 2 (two) times daily. 10.2 g 2   busPIRone  (BUSPAR ) 10 MG tablet Take 1 tablet (10 mg total) by mouth 2 (two) times daily. 180 tablet 1   cetirizine (ZYRTEC) 10 MG tablet Take 10 mg by mouth daily.     Cholecalciferol  (VITAMIN D ) 50 MCG (2000 UT) tablet Take 1 tablet (2,000 Units total) by mouth daily.     diphenhydrAMINE  (BENADRYL ) 25 MG tablet Take 50 mg by mouth every 6 (six) hours as needed (hives).     EPINEPHrine  0.3 mg/0.3 mL IJ SOAJ injection Inject 0.3 mg into the muscle as directed, in case of an anaphylaxis reaction. 2 each 1   EPINEPHrine  0.3 mg/0.3 mL IJ SOAJ injection Inject 0.3 mg into the skin as directed for anaphylaxis. 2 each 1   estradiol  (ESTRACE ) 0.1 MG/GM vaginal cream Insert 0.5 grams vaginally 2 (two) times a week as directed 42.5 g 6   estradiol  (ESTRACE ) 0.1 MG/GM vaginal cream Place 0.5 g vaginally 2 (two) times a week. 42.5 g 3   fluticasone  (FLONASE ) 50 MCG/ACT nasal spray Place 2 sprays into both nostrils daily. 16 g 0   gabapentin  (NEURONTIN ) 100 MG capsule Take 1 capsule (100 mg total) by mouth 3 (three) times daily. 270 capsule 0   ibuprofen  (ADVIL ) 200 MG tablet Take 400 mg by mouth every 6 (six) hours as needed for moderate pain.  melatonin 5 MG TABS Take 5 mg by mouth at bedtime as needed (sleep).     montelukast  (SINGULAIR ) 10 MG tablet Take 1 tablet (10 mg total) by mouth every evening. 30 tablet 5   Multiple Vitamin (MULTI VITAMIN) TABS 1 tablet Orally Once a day     Olopatadine  HCl 0.6 % SOLN Place 2 sprays into both nostrils 2 (two) times daily. 30.5 g 5   Probiotic Product ([employer] PROBIOTIC PO) Take 1 capsule by mouth daily.     promethazine -dextromethorphan (PROMETHAZINE -DM) 6.25-15 MG/5ML syrup Take 5 mLs by mouth 4 (four) times daily as needed for cough. (Patient not taking: Reported on 12/01/2023) 118 mL 0   rosuvastatin  (CRESTOR ) 5 MG tablet Take 1 tablet (5 mg total) by mouth daily. 90 tablet 5   tamoxifen   (NOLVADEX ) 10 MG tablet Take 1 tablet (10 mg total) by mouth daily. 90 tablet 3   tirzepatide  (MOUNJARO ) 10 MG/0.5ML Pen Inject 10 mg into the skin once a week. 2 mL 0   tirzepatide  (MOUNJARO ) 7.5 MG/0.5ML Pen Inject 7.5 mg into the skin once a week. 2 mL 0   tirzepatide  (MOUNJARO ) 7.5 MG/0.5ML Pen Inject 7.5 mg into the skin once a week. 2 mL 3   venlafaxine  XR (EFFEXOR -XR) 150 MG 24 hr capsule Take 1 capsule (150 mg total) by mouth daily with breakfast. 90 capsule 1   [DISCONTINUED] Investigational olanzapine /placebo 10 MG capsule URCC 16070 Blister Card 2 Take 1 capsule by mouth daily. Take in the morning on Days 2-4. 3 capsule 0   No current facility-administered medications on file prior to visit.    ALLERGIES: Allergies  Allergen Reactions   Bee Venom Anaphylaxis and Hives    wheezy   Dog Epithelium (Canis Lupus Familiaris) Shortness Of Breath   Dust Mite Extract Other (See Comments)    sneezing   Grass Pollen(K-O-R-T-Swt Vern) Other (See Comments)    sneezing   Other Itching    environmental allergies sometime activate asthma   Pollen Extract Other (See Comments)    sneezing    FAMILY HISTORY: Family History  Problem Relation Age of Onset   Cancer Mother    Thyroid  disease Mother    Hyperlipidemia Mother    Hypertension Mother    Fibromyalgia Mother    Colon cancer Mother 71   Depression Mother    Anxiety disorder Mother    Obesity Mother    Asthma Sister        x2   Hypertension Maternal Grandmother    Hypertension Maternal Grandfather    Cancer - Lung Maternal Grandfather    Stroke Maternal Grandfather    Kassie Parkinson White syndrome Maternal Aunt 58   Colon cancer Other        MGM's niece, dx unknown age   Colon cancer Other        MGM's nephew; dx unknown age   Breast cancer Neg Hx     Objective:  *** General: No acute distress.  Patient appears well-groomed.   Head:  Normocephalic/atraumatic Eyes:  fundi examined but not visualized Neck: supple,  no paraspinal tenderness, full range of motion Back: No paraspinal tenderness Heart: regular rate and rhythm Lungs: Clear to auscultation bilaterally. Vascular: No carotid bruits. Neurological Exam: Mental status: alert and oriented to person, place, and time, speech fluent and not dysarthric, language intact. Cranial nerves: CN I: not tested CN II: pupils equal, round and reactive to light, visual fields intact CN III, IV, VI:  full range of motion,  no nystagmus, no ptosis CN V: facial sensation intact. CN VII: upper and lower face symmetric CN VIII: hearing intact CN IX, X: gag intact, uvula midline CN XI: sternocleidomastoid and trapezius muscles intact CN XII: tongue midline Bulk & Tone: normal, no fasciculations. Motor:  muscle strength 5/5 throughout Sensation:  Pinprick, temperature and vibratory sensation intact. Deep Tendon Reflexes:  2+ throughout,  toes downgoing.   Finger to nose testing:  Without dysmetria.   Heel to shin:  Without dysmetria.   Gait:  Normal station and stride.  Romberg negative.    Thank you for allowing me to take part in the care of this patient.  Juliene Dunnings, DO  CC: ***

## 2024-05-26 ENCOUNTER — Ambulatory Visit: Admitting: Neurology

## 2024-05-27 ENCOUNTER — Other Ambulatory Visit (HOSPITAL_COMMUNITY): Payer: Self-pay

## 2024-05-31 ENCOUNTER — Ambulatory Visit (INDEPENDENT_AMBULATORY_CARE_PROVIDER_SITE_OTHER): Payer: Commercial Managed Care - PPO | Admitting: Physician Assistant

## 2024-05-31 ENCOUNTER — Encounter: Payer: Self-pay | Admitting: Physician Assistant

## 2024-05-31 DIAGNOSIS — Z630 Problems in relationship with spouse or partner: Secondary | ICD-10-CM

## 2024-05-31 DIAGNOSIS — F4323 Adjustment disorder with mixed anxiety and depressed mood: Secondary | ICD-10-CM

## 2024-05-31 DIAGNOSIS — R4584 Anhedonia: Secondary | ICD-10-CM | POA: Diagnosis not present

## 2024-05-31 NOTE — Progress Notes (Signed)
 [facility]  Patient ID: Jennifer Davis,  MRN: [id number]  PCP: Ransom Other, MD  Date of Evaluation: 05/31/2024 Time spent:25 minutes   Chief Complaint:  Chief Complaint   Anxiety; Depression; Insomnia; Follow-up    HISTORY/CURRENT STATUS: HPI For routine f/u.   Feels a little depressed.  Doesn't want to do things, no motivation, no energy. Her husband moved back in and that's been a struggle. Liked living alone. She discusses these things with her counselor.  Her 2 oldest kids moved out, so that's been stressful. She's not missing work.  Work is still stressful, works for [employer], is bored, not satisfied, looking for another job.  Feels sad.  Sleeps ok sometimes, but not always.  Sometimes has trouble falling asleep.  Hasn't taken the Melatonin lately.  ADLs and personal hygiene are normal.   Denies any changes in concentration, making decisions, or remembering things.  Appetite has not changed.  Not eating very healthy at times.  Sometimes she's really hungry, and other times not.  No mania, delirium, AH/VH.  No SI/HI.  Denies dizziness, syncope, seizures, numbness, tingling, tremor, tics, unsteady gait, slurred speech, confusion. Denies muscle or joint pain, stiffness, or dystonia.  Individual Medical History/ Review of Systems: Changes? :No    Past medications for mental health diagnoses include: Lexapro , Prozac wasn't effective, Xanax, Buspar  worked but d/c when didn't need it, Cymbalta , Effexor   Allergies: Bee venom, Dog epithelium (canis lupus familiaris), Dust mite extract, Grass pollen(k-o-r-t-swt vern), Other, and Pollen extract  Current Medications:  Current Outpatient Medications:    albuterol  (PROVENTIL ) (2.5 MG/3ML) 0.083% nebulizer solution, Take 3 mLs (2.5 mg total) by nebulization every 6 (six) hours as needed for wheezing or shortness of breath., Disp: 75 mL, Rfl: 1   albuterol  (VENTOLIN  HFA) 108 (90 Base) MCG/ACT inhaler, Inhale 2 puffs into the lungs  every 4-6 hours as needed., Disp: 6.7 g, Rfl: 0   budesonide -formoterol  (SYMBICORT ) 160-4.5 MCG/ACT inhaler, Inhale 2 puffs into the lungs 2 (two) times daily., Disp: 10.2 g, Rfl: 2   busPIRone  (BUSPAR ) 10 MG tablet, Take 1 tablet (10 mg total) by mouth 2 (two) times daily., Disp: 180 tablet, Rfl: 1   cetirizine (ZYRTEC) 10 MG tablet, Take 10 mg by mouth daily., Disp: , Rfl:    Cholecalciferol  (VITAMIN D ) 50 MCG (2000 UT) tablet, Take 1 tablet (2,000 Units total) by mouth daily., Disp: , Rfl:    diphenhydrAMINE  (BENADRYL ) 25 MG tablet, Take 50 mg by mouth every 6 (six) hours as needed (hives)., Disp: , Rfl:    EPINEPHrine  0.3 mg/0.3 mL IJ SOAJ injection, Inject 0.3 mg into the muscle as directed, in case of an anaphylaxis reaction., Disp: 2 each, Rfl: 1   EPINEPHrine  0.3 mg/0.3 mL IJ SOAJ injection, Inject 0.3 mg into the skin as directed for anaphylaxis., Disp: 2 each, Rfl: 1   estradiol  (ESTRACE ) 0.1 MG/GM vaginal cream, Insert 0.5 grams vaginally 2 (two) times a week as directed, Disp: 42.5 g, Rfl: 6   estradiol  (ESTRACE ) 0.1 MG/GM vaginal cream, Place 0.5 g vaginally 2 (two) times a week., Disp: 42.5 g, Rfl: 3   fluticasone  (FLONASE ) 50 MCG/ACT nasal spray, Place 2 sprays into both nostrils daily., Disp: 16 g, Rfl: 0   gabapentin  (NEURONTIN ) 100 MG capsule, Take 1 capsule (100 mg total) by mouth 3 (three) times daily., Disp: 270 capsule, Rfl: 0   ibuprofen  (ADVIL ) 200 MG tablet, Take 400 mg by mouth every 6 (six) hours as needed for moderate pain.,  Disp: , Rfl:    melatonin 5 MG TABS, Take 5 mg by mouth at bedtime as needed (sleep)., Disp: , Rfl:    montelukast  (SINGULAIR ) 10 MG tablet, Take 1 tablet (10 mg total) by mouth every evening., Disp: 30 tablet, Rfl: 5   Multiple Vitamin (MULTI VITAMIN) TABS, 1 tablet Orally Once a day, Disp: , Rfl:    Olopatadine  HCl 0.6 % SOLN, Place 2 sprays into both nostrils 2 (two) times daily., Disp: 30.5 g, Rfl: 5   Probiotic Product ([employer] PROBIOTIC PO), Take 1  capsule by mouth daily., Disp: , Rfl:    rosuvastatin  (CRESTOR ) 5 MG tablet, Take 1 tablet (5 mg total) by mouth daily., Disp: 90 tablet, Rfl: 5   tamoxifen  (NOLVADEX ) 10 MG tablet, Take 1 tablet (10 mg total) by mouth daily., Disp: 90 tablet, Rfl: 3   tirzepatide  (MOUNJARO ) 10 MG/0.5ML Pen, Inject 10 mg into the skin once a week., Disp: 2 mL, Rfl: 0   venlafaxine  XR (EFFEXOR -XR) 150 MG 24 hr capsule, Take 1 capsule (150 mg total) by mouth daily with breakfast., Disp: 90 capsule, Rfl: 1   promethazine -dextromethorphan (PROMETHAZINE -DM) 6.25-15 MG/5ML syrup, Take 5 mLs by mouth 4 (four) times daily as needed for cough. (Patient not taking: Reported on 12/01/2023), Disp: 118 mL, Rfl: 0   tirzepatide  (MOUNJARO ) 7.5 MG/0.5ML Pen, Inject 7.5 mg into the skin once a week., Disp: 2 mL, Rfl: 0   tirzepatide  (MOUNJARO ) 7.5 MG/0.5ML Pen, Inject 7.5 mg into the skin once a week., Disp: 2 mL, Rfl: 3 Medication Side Effects: none  Family Medical/ Social History: Changes? See HPI  MENTAL HEALTH EXAM:  Last menstrual period 12/10/2020.There is no height or weight on file to calculate BMI.  General Appearance: Casual and Well Groomed  Eye Contact:  Good  Speech:  Clear and Coherent and Normal Rate  Volume:  Normal  Mood:  Euthymic  Affect:  Congruent  Thought Process:  Goal Directed and Descriptions of Associations: Circumstantial  Orientation:  Full (Time, Place, and Person)  Thought Content: Logical   Suicidal Thoughts:  No  Homicidal Thoughts:  No  Memory:  WNL  Judgement:  Good  Insight:  Good  Psychomotor Activity:  Normal  Concentration:  Concentration: Good and Attention Span: Good  Recall:  Good  Fund of Knowledge: Good  Language: Good  Assets:  Communication Skills Desire for Improvement Financial Resources/Insurance Housing Resilience Transportation Vocational/Educational  ADL's:  Intact  Cognition: WNL  Prognosis:  Good   DIAGNOSES:    ICD-10-CM   1. Situational mixed anxiety  and depressive disorder  F43.23     2. Anhedonia  R45.84     3. Marital stress  Z63.0      Receiving Psychotherapy: Yes  [facility] at [facility]  RECOMMENDATIONS:  PDMP reviewed.  Gabapentin  filled 03/19/2023. I provided approximately 25 minutes of face to face time during this encounter, including time spent before and after the visit in records review, medical decision making, counseling pertinent to today's visit, and charting.   Make sure she's eating healthy most of the time, exercise daily, even if a short walk.  Sleep hygiene discussed.  We discussed whether increasing the Effexor  is a good idea or not.  Since her symptoms seem more situational than biochemical I think staying the course is appropriate however we can increase the dose if counseling and behavioral changes are not beneficial or if she gets worse at any point.  She verbalizes understanding.  Continue Buspar  10  mg bid.  Continue gabapentin  100 mg, 1 p.o. 3 times daily as needed. Continue melatonin 5 mg nightly as needed sleep. Continue Effexor  XR 150 mg, 1 p.o. daily. Continue therapy both individual and marriage counseling. Return in 3 months.  Verneita Cooks, PA-C

## 2024-06-05 DIAGNOSIS — F4312 Post-traumatic stress disorder, chronic: Secondary | ICD-10-CM | POA: Diagnosis not present

## 2024-06-08 ENCOUNTER — Inpatient Hospital Stay: Attending: Hematology and Oncology | Admitting: Hematology and Oncology

## 2024-06-08 VITALS — BP 121/78 | HR 74 | Temp 97.9°F | Resp 16 | Wt 132.9 lb

## 2024-06-08 DIAGNOSIS — Z79810 Long term (current) use of selective estrogen receptor modulators (SERMs): Secondary | ICD-10-CM | POA: Diagnosis not present

## 2024-06-08 DIAGNOSIS — R232 Flushing: Secondary | ICD-10-CM | POA: Insufficient documentation

## 2024-06-08 DIAGNOSIS — Z9013 Acquired absence of bilateral breasts and nipples: Secondary | ICD-10-CM | POA: Diagnosis not present

## 2024-06-08 DIAGNOSIS — C50412 Malignant neoplasm of upper-outer quadrant of left female breast: Secondary | ICD-10-CM | POA: Diagnosis not present

## 2024-06-08 DIAGNOSIS — Z79899 Other long term (current) drug therapy: Secondary | ICD-10-CM | POA: Insufficient documentation

## 2024-06-08 DIAGNOSIS — Z170 Estrogen receptor positive status [ER+]: Secondary | ICD-10-CM | POA: Diagnosis not present

## 2024-06-08 NOTE — Assessment & Plan Note (Signed)
 09/20/2019: Screening mammogram on 08/28/20 showed the stable bilateral masses, a new 0.9cm mass at the 1 o'clock position in the left breast, and one abnormal left axillary lymph node with 0.6cm cortical thickening. 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  MammaPrint: High risk: Luminal type B, 5-year metastasis free survival with chemo and hormone therapy: 93%, absolute benefit from chemo greater than 12%, average 10-year risk of recurrence untreated: 29%   Treatment plan: 1.  :Bilateral mastectomies with reconstruction Jan): Right mastectomy: no malignancy Left mastectomy: invasive and in situ ductal carcinoma, 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  2.  adjuvant chemotherapy with dose dense Adriamycin  and Cytoxan  followed by Taxol  completed 06/19/2021 3.  Adjuvant radiation 07/09/2021-08/22/2021 4.  Followed by adjuvant antiestrogen therapy with tamoxifen  started 09/09/2021 ------------------------------------------------------------------------------------------------------------- Current treatment: Antiestrogen therapy with tamoxifen  daily started 09/09/2021 dose reduced 05/21/2022 to 10 mg Tamoxifen  toxicities: Hot flashes Otherwise tolerating the 10 mg dose extremely well.   Breast cancer surveillance: mammogram: No role of mammograms since she had right-sided mastectomy and left breast reconstructed. 02/24/2023: Breast implant placement    Patient lost 60 pounds on Mounjaro  and controlling her diet. She has a new sense of optimism.  She and her husband were separated previously are now starting to get back together. She has lots of plans to explore and do adventurous things.   I discussed with her about Signatera minimal residual disease testing and she will think about it and will inform us . return to clinic in 1 year for follow-up

## 2024-06-08 NOTE — Progress Notes (Signed)
 Patient Care Team: Ransom Other, MD as PCP - General (Internal Medicine) Jennifer Berg, MD as Consulting Physician (General Surgery) Odean Potts, MD as Consulting Physician (Hematology and Oncology) Shannon Agent, MD as Consulting Physician (Radiation Oncology)  DIAGNOSIS:  Encounter Diagnosis  Name Primary?   Malignant neoplasm of upper-outer quadrant of left breast in female, estrogen receptor positive (HCC) Yes    SUMMARY OF ONCOLOGIC HISTORY: Oncology History  Malignant neoplasm of upper-outer quadrant of left breast in female, estrogen receptor positive (HCC)  09/19/2020 Initial Diagnosis   Mammogram in 07/2018 showed probably benign bilateral breast masses that were stable on her last mammogram on 07/26/19. Mammogram on 08/28/20 showed the stable bilateral masses, a new 0.9cm mass at the 1 o'clock position in the left breast, and one abnormal left axillary lymph node with 0.6cm cortical thickening. 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    10/02/2020 Genetic Testing   Negative genetic testing: no pathogenic variants detected in [facility] Common-Hereditary Cancers Panel.  Variants of uncertain significance detected in APC c.6918T>A (p.Asp2306Glu) and POLE  c.2612G>C (p.Ser871Thr).  The report date is November 24. 2021.   The Common Hereditary Cancers Panel offered by [facility] includes sequencing and/or deletion duplication testing of the following 48 genes: APC, ATM, AXIN2, BARD1, BMPR1A, BRCA1, BRCA2, BRIP1, CDH1, CDK4, CDKN2A (p14ARF), CDKN2A (p16INK4a), CHEK2, CTNNA1, DICER1, EPCAM (Deletion/duplication testing only), GREM1 (promoter region deletion/duplication testing only), KIT, MEN1, MLH1, MSH2, MSH3, MSH6, MUTYH, NBN, NF1, NHTL1, PALB2, PDGFRA, PMS2, POLD1, POLE, PTEN, RAD50, RAD51C, RAD51D, RNF43, SDHB, SDHC, SDHD, SMAD4, SMARCA4. STK11, TP53, TSC1, TSC2, and VHL.  The  following genes were evaluated for sequence changes only: SDHA and HOXB13 c.251G>A variant only.   12/04/2020 Surgery   Bilateral mastectomies with reconstruction Jan):  Right breast: no malignancy  Left breast: invasive and in situ ductal carcinoma, 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    12/04/2020 Cancer Staging   Staging form: Breast, AJCC 8th Edition - Pathologic stage from 12/04/2020: Stage IA (pT1c, pN1a, cM0, G2, ER+, PR+, HER2-) - Signed by Crawford Morna Pickle, NP on 12/18/2020 Histologic grading system: 3 grade system   12/04/2020 Miscellaneous   MammaPrint: High risk: Luminal type B, 5-year metastasis free survival with chemo and hormone therapy: 93%, absolute benefit from chemo greater than 12%, average 10-year risk of recurrence untreated: 29%   01/22/2021 - 06/19/2021 Chemotherapy   Patient is on Treatment Plan : BREAST ADJUVANT DOSE DENSE AC q14d / PACLitaxel  q7d     07/09/2021 - 08/22/2021 Radiation Therapy   07/09/2021 through 08/22/2021 Site Technique Total Dose (Gy) Dose per Fx (Gy) Completed Fx Beam Energies  Chest Wall, Left: CW_Lt 3D 50/50 2 25/25 10X, 15X  Sclav-LT: SCV_Lt 3D 50/50 2 25/25 10X, 15X  Chest Wall, Left: CW_Lt_Bst Electron 10/10 2 5/5 6E     09/2021 -  Anti-estrogen oral therapy   Tamoxifen  daily     CHIEF COMPLIANT: Follow-up on tamoxifen  therapy  HISTORY OF PRESENT ILLNESS:   History of Present Illness Jennifer Davis is a 48 year old female with breast cancer who presents for follow-up regarding hot flashes and medication management.  She has lost nearly 100 pounds since her last visit, attributed to medication and lifestyle changes, including reduced carbohydrate intake and increased fruit consumption. She participates in the [facility] program and finds Jennifer Davis effective.  She experiences frequent and sometimes severe hot flashes, occurring unexpectedly, even in cold  environments. She uses peppermint essential oil for relief and is currently taking Effexor  to reduce her frequency.  She is on tamoxifen  as part of her breast cancer treatment. No medication refills are needed at this time.     ALLERGIES:  is allergic to bee venom, dog epithelium (canis lupus familiaris), dust mite extract, grass pollen(k-o-r-t-swt vern), other, and pollen extract.  MEDICATIONS:  Current Outpatient Medications  Medication Sig Dispense Refill   albuterol  (PROVENTIL ) (2.5 MG/3ML) 0.083% nebulizer solution Take 3 mLs (2.5 mg total) by nebulization every 6 (six) hours as needed for wheezing or shortness of breath. 75 mL 1   albuterol  (VENTOLIN  HFA) 108 (90 Base) MCG/ACT inhaler Inhale 2 puffs into the lungs every 4-6 hours as needed. 6.7 g 0   budesonide -formoterol  (SYMBICORT ) 160-4.5 MCG/ACT inhaler Inhale 2 puffs into the lungs 2 (two) times daily. 10.2 g 2   busPIRone  (BUSPAR ) 10 MG tablet Take 1 tablet (10 mg total) by mouth 2 (two) times daily. 180 tablet 1   cetirizine (ZYRTEC) 10 MG tablet Take 10 mg by mouth daily.     Cholecalciferol  (VITAMIN D ) 50 MCG (2000 UT) tablet Take 1 tablet (2,000 Units total) by mouth daily.     diphenhydrAMINE  (BENADRYL ) 25 MG tablet Take 50 mg by mouth every 6 (six) hours as needed (hives).     EPINEPHrine  0.3 mg/0.3 mL IJ SOAJ injection Inject 0.3 mg into the muscle as directed, in case of an anaphylaxis reaction. 2 each 1   EPINEPHrine  0.3 mg/0.3 mL IJ SOAJ injection Inject 0.3 mg into the skin as directed for anaphylaxis. 2 each 1   estradiol  (ESTRACE ) 0.1 MG/GM vaginal cream Place 0.5 g vaginally 2 (two) times a week. 42.5 g 3   fluticasone  (FLONASE ) 50 MCG/ACT nasal spray Place 2 sprays into both nostrils daily. 16 g 0   ibuprofen  (ADVIL ) 200 MG tablet Take 400 mg by mouth every 6 (six) hours as needed for moderate pain.     melatonin 5 MG TABS Take 5 mg by mouth at bedtime as needed (sleep).     montelukast  (SINGULAIR ) 10 MG tablet Take  1 tablet (10 mg total) by mouth every evening. 30 tablet 5   Multiple Vitamin (MULTI VITAMIN) TABS 1 tablet Orally Once a day     Olopatadine  HCl 0.6 % SOLN Place 2 sprays into both nostrils 2 (two) times daily. 30.5 g 5   Probiotic Product ([employer] PROBIOTIC PO) Take 1 capsule by mouth daily.     rosuvastatin  (CRESTOR ) 5 MG tablet Take 1 tablet (5 mg total) by mouth daily. 90 tablet 5   tamoxifen  (NOLVADEX ) 10 MG tablet Take 1 tablet (10 mg total) by mouth daily. 90 tablet 3   tirzepatide  (MOUNJARO ) 7.5 MG/0.5ML Pen Inject 7.5 mg into the skin once a week. 2 mL 3   venlafaxine  XR (EFFEXOR -XR) 150 MG 24 hr capsule Take 1 capsule (150 mg total) by mouth daily with breakfast. 90 capsule 1   gabapentin  (NEURONTIN ) 100 MG capsule Take 1 capsule (100 mg total) by mouth 3 (three) times daily. 270 capsule 0   tirzepatide  (MOUNJARO ) 10 MG/0.5ML Pen Inject 10 mg into the skin once a week. 2 mL 0   No current facility-administered medications for this visit.    PHYSICAL EXAMINATION: ECOG PERFORMANCE STATUS: 1 - Symptomatic but completely ambulatory  Vitals:   06/08/24 1038  BP: 121/78  Pulse: 74  Resp:  16  Temp: 97.9 F (36.6 C)  SpO2: 100%   Filed Weights   06/08/24 1038  Weight: 132 lb 14.4 oz (60.3 kg)    Physical Exam No palpable lumps nodules bilateral reconstructed breasts or axilla  (exam performed in the presence of a chaperone)  LABORATORY DATA:  I have reviewed the data as listed    Latest Ref Rng & Units 06/09/2023    6:45 PM 02/18/2023    3:40 PM 10/09/2022   10:22 AM  CMP  Glucose 70 - 99 mg/dL 878  877  99   BUN 6 - 20 mg/dL 10  10  9    Creatinine 0.44 - 1.00 mg/dL 9.14  9.17  9.31   Sodium 135 - 145 mmol/L 138  138  142   Potassium 3.5 - 5.1 mmol/L 3.2  4.1  3.9   Chloride 98 - 111 mmol/L 98  105  110   CO2 22 - 32 mmol/L 27  25  25    Calcium  8.9 - 10.3 mg/dL 9.5  8.9  8.2   Total Protein 6.5 - 8.1 g/dL 7.2   6.6   Total Bilirubin 0.3 - 1.2 mg/dL 0.3   0.4    Alkaline Phos 38 - 126 U/L 56   137   AST 15 - 41 U/L 37   63   ALT 0 - 44 U/L 37   104     Lab Results  Component Value Date   WBC 10.4 06/09/2023   HGB 13.9 06/09/2023   HCT 42.0 06/09/2023   MCV 89.2 06/09/2023   PLT 374 06/09/2023   NEUTROABS 5.7 06/09/2023    ASSESSMENT & PLAN:  Malignant neoplasm of upper-outer quadrant of left breast in female, estrogen receptor positive (HCC) 09/20/2019: Screening mammogram on 08/28/20 showed the stable bilateral masses, a new 0.9cm mass at the 1 o'clock position in the left breast, and one abnormal left axillary lymph node with 0.6cm cortical thickening. 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  MammaPrint: High risk: Luminal type B, 5-year metastasis free survival with chemo and hormone therapy: 93%, absolute benefit from chemo greater than 12%, average 10-year risk of recurrence untreated: 29%   Treatment plan: 1.  :Bilateral mastectomies with reconstruction Jan): Right mastectomy: no malignancy Left mastectomy: invasive and in situ ductal carcinoma, 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  2.  adjuvant chemotherapy with dose dense Adriamycin  and Cytoxan  followed by Taxol  completed 06/19/2021 3.  Adjuvant radiation 07/09/2021-08/22/2021 4.  Followed by adjuvant antiestrogen therapy with tamoxifen  started 09/09/2021 ------------------------------------------------------------------------------------------------------------- Current treatment: Antiestrogen therapy with tamoxifen  daily started 09/09/2021 dose reduced 05/21/2022 to 10 mg Tamoxifen  toxicities: Hot flashes Otherwise tolerating the 10 mg dose extremely well.   Breast cancer surveillance: mammogram: No role of mammograms since she had right-sided mastectomy and left breast reconstructed. 02/24/2023: Breast implant placement     Patient lost 60 pounds on Mounjaro  and controlling her diet. She has a new sense of optimism.  She and her husband were separated previously are now starting to get back together. She has lots of plans to explore and do adventurous things.   I discussed with her about [employer] minimal residual disease testing and she will think about it and will inform us . return to clinic in 1 year for follow-up ------------------------------------- Assessment and Plan Assessment & Plan Estrogen receptor positive malignant neoplasm  of upper-outer quadrant of left breast Currently on tamoxifen  therapy with no recurrence.  Hot flashes due to tamoxifen  therapy Significant hot flashes from tamoxifen  causing discomfort, managed with current interventions. - Continue Effexor  and occasional peppermint essential oil.      No orders of the defined types were placed in this encounter.  The patient has a good understanding of the overall plan. she agrees with it. she will call with any problems that may develop before the next visit here. Total time spent: 30 mins including face to face time and time spent for planning, charting and co-ordination of care   Viinay K Kalisi Bevill, MD 06/08/24

## 2024-06-20 DIAGNOSIS — F4312 Post-traumatic stress disorder, chronic: Secondary | ICD-10-CM | POA: Diagnosis not present

## 2024-06-22 ENCOUNTER — Other Ambulatory Visit (HOSPITAL_COMMUNITY): Payer: Self-pay

## 2024-06-22 MED ORDER — MOUNJARO 10 MG/0.5ML ~~LOC~~ SOAJ
10.0000 mg | SUBCUTANEOUS | 0 refills | Status: AC
  Filled 2024-06-22 – 2024-08-06 (×3): qty 2, 28d supply, fill #0

## 2024-06-26 ENCOUNTER — Other Ambulatory Visit (HOSPITAL_COMMUNITY): Payer: Self-pay

## 2024-06-26 DIAGNOSIS — J329 Chronic sinusitis, unspecified: Secondary | ICD-10-CM | POA: Diagnosis not present

## 2024-06-26 DIAGNOSIS — F4312 Post-traumatic stress disorder, chronic: Secondary | ICD-10-CM | POA: Diagnosis not present

## 2024-06-26 MED ORDER — HYDROCODONE BIT-HOMATROP MBR 5-1.5 MG/5ML PO SOLN
5.0000 mL | Freq: Four times a day (QID) | ORAL | 0 refills | Status: DC | PRN
  Filled 2024-06-26: qty 140, 7d supply, fill #0

## 2024-06-26 MED ORDER — AMOXICILLIN-POT CLAVULANATE 875-125 MG PO TABS
1.0000 | ORAL_TABLET | Freq: Two times a day (BID) | ORAL | 0 refills | Status: DC
  Filled 2024-06-26: qty 14, 7d supply, fill #0

## 2024-06-28 ENCOUNTER — Other Ambulatory Visit (HOSPITAL_COMMUNITY): Payer: Self-pay

## 2024-07-04 ENCOUNTER — Other Ambulatory Visit (HOSPITAL_COMMUNITY): Payer: Self-pay

## 2024-07-04 MED ORDER — MOUNJARO 10 MG/0.5ML ~~LOC~~ SOAJ
10.0000 mg | SUBCUTANEOUS | 0 refills | Status: AC
  Filled 2024-07-04: qty 2, 28d supply, fill #0

## 2024-07-06 DIAGNOSIS — F4312 Post-traumatic stress disorder, chronic: Secondary | ICD-10-CM | POA: Diagnosis not present

## 2024-07-12 DIAGNOSIS — Z8639 Personal history of other endocrine, nutritional and metabolic disease: Secondary | ICD-10-CM | POA: Diagnosis not present

## 2024-07-12 DIAGNOSIS — E782 Mixed hyperlipidemia: Secondary | ICD-10-CM | POA: Diagnosis not present

## 2024-07-12 DIAGNOSIS — Z6824 Body mass index (BMI) 24.0-24.9, adult: Secondary | ICD-10-CM | POA: Diagnosis not present

## 2024-07-12 DIAGNOSIS — E1169 Type 2 diabetes mellitus with other specified complication: Secondary | ICD-10-CM | POA: Diagnosis not present

## 2024-07-21 DIAGNOSIS — F4312 Post-traumatic stress disorder, chronic: Secondary | ICD-10-CM | POA: Diagnosis not present

## 2024-07-24 DIAGNOSIS — F4312 Post-traumatic stress disorder, chronic: Secondary | ICD-10-CM | POA: Diagnosis not present

## 2024-08-01 DIAGNOSIS — F4312 Post-traumatic stress disorder, chronic: Secondary | ICD-10-CM | POA: Diagnosis not present

## 2024-08-06 ENCOUNTER — Other Ambulatory Visit (HOSPITAL_COMMUNITY): Payer: Self-pay

## 2024-08-06 ENCOUNTER — Other Ambulatory Visit: Payer: Self-pay | Admitting: Physician Assistant

## 2024-08-06 MED ORDER — VENLAFAXINE HCL ER 150 MG PO CP24
150.0000 mg | ORAL_CAPSULE | Freq: Every day | ORAL | 0 refills | Status: DC
  Filled 2024-08-06: qty 90, 90d supply, fill #0

## 2024-08-07 ENCOUNTER — Other Ambulatory Visit (HOSPITAL_COMMUNITY): Payer: Self-pay

## 2024-08-07 ENCOUNTER — Other Ambulatory Visit: Payer: Self-pay

## 2024-08-07 DIAGNOSIS — F4312 Post-traumatic stress disorder, chronic: Secondary | ICD-10-CM | POA: Diagnosis not present

## 2024-08-07 MED ORDER — MONTELUKAST SODIUM 10 MG PO TABS
10.0000 mg | ORAL_TABLET | Freq: Every evening | ORAL | 3 refills | Status: AC
  Filled 2024-08-07: qty 30, 30d supply, fill #0
  Filled 2024-11-06: qty 30, 30d supply, fill #1

## 2024-08-15 NOTE — Progress Notes (Unsigned)
 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 Diet modification/hydration/caffeine cessation Routine exercise Sleep hygiene Consider vitamins/supplements:  magnesium  citrate 400mg  daily, riboflavin 400mg  daily, CoQ10 100mg  three times daily Keep headache diary Consider seeing dentist to assess if mouth guard is warranted. Follow up as needed.  Total time spent in chart and face to face with patient:  33 minutes  Subjective:  Jennifer Davis is a 48 year old right-handed female with DM, asthma, palpitations, PTSD and history of breast cancer who presents for worsening headaches.  History supplemented by referring provider's note.  She began experiencing headaches in her late-30s to early-40s.  She describes a 7/10 stabbing pain in the left retro-orbital/frontal region and aching/soreness of the left side of her jaw.  Associated with left ptosis, left eye lacrimation, photophobia, phonophobia and sometimes nausea and trouble focusing her vision.  No associated vomiting, conjunctival injection, unilateral numbness or weakness.  Usually lasts a day and occurs one to two times a year, which she attributes to being triggered by allergies.  In June 2025, she had a recurrence that was daily for 4 weeks.  She was under more emotional stress at the time and had restarted allergy shots, which may have been a trigger.  She hasn't resumed allergy shots since then.  No recurrence of daily headaches.     Past NSAIDS/analgesics:  none Past abortive triptans:  none Past abortive ergotamine:  none Past muscle relaxants:  none Past anti-emetic:  Zofran , Compazine  10mg  Past antihypertensive medications:  none Past antidepressant medications:  duloxetine , escitalopram  Past anticonvulsant medications:  none Past anti-CGRP:  none Past vitamins/Herbal/Supplements:  none Past antihistamines/decongestants:  Benadryl , Flonase , loratadine  Other past therapies:  prednisone  taper  Current NSAIDS/analgesics:  none Current triptans:  none Current ergotamine:  none Current anti-emetic:  none Current muscle relaxants:  none Current Antihypertensive medications:  none Current Antidepressant medications:  venlafaxine  XR 150mg  daily Current Anticonvulsant medications:  gabapentin  100mg  TID PRN Current anti-CGRP:  none Current Vitamins/Herbal/Supplements:  none Current Antihistamines/Decongestants:  Zyrtec, Flonase , Benadryl  Other therapy:  cold packs, hot showers Other medications:  buspirone , Tamoxifen , Mounjaro    Family history of headache:  no       PAST MEDICAL HISTORY: Past Medical History:  Diagnosis Date   Anxiety    Asthma    Asthma due to seasonal allergies    Back pain    Breast cancer (HCC)    Constipation    Depression    Diabetes mellitus without complication (HCC)    Dyspnea    after chemo - fatiqued   Fluid retention    GERD (gastroesophageal reflux disease)    occasionl   History of radiation therapy    left chest wall and subclavian 07/09/2021-08/22/2021   Dr Lynwood Nasuti   Joint pain    Lower extremity edema    Morbid obesity (HCC)    Palpitations 2017   tachy    Palpitations 01/09/2016   Prediabetes    PTSD (post-traumatic stress disorder)    SOB (shortness of breath)  Swallowing difficulty     PAST SURGICAL HISTORY: Past Surgical History:  Procedure Laterality Date   BREAST RECONSTRUCTION WITH PLACEMENT OF TISSUE EXPANDER AND FLEX HD (ACELLULAR  HYDRATED DERMIS) Bilateral 12/04/2020   Procedure: IMMEDIATE BILATERAL BREAST RECONSTRUCTION WITH PLACEMENT OF TISSUE EXPANDER AND FLEX HD (ACELLULAR HYDRATED DERMIS);  Surgeon: Lowery Estefana RAMAN, DO;  Location: [facility];  Service: Plastics;  Laterality: Bilateral;   BREAST RECONSTRUCTION WITH PLACEMENT OF TISSUE EXPANDER AND FLEX HD (ACELLULAR HYDRATED DERMIS) Right 09/28/2022   Procedure: BREAST RECONSTRUCTION WITH PLACEMENT OF TISSUE EXPANDER AND FLEX HD (ACELLULAR HYDRATED DERMIS);  Surgeon: Lowery Estefana RAMAN, DO;  Location: [facility];  Service: Plastics;  Laterality: Right;   CESAREAN SECTION     x3   DEBRIDEMENT AND CLOSURE WOUND Right 04/10/2021   Procedure: excision of right breast wound with closure;  Surgeon: Lowery Estefana RAMAN, DO;  Location: [facility];  Service: Plastics;  Laterality: Right;   MASTECTOMY WITH AXILLARY LYMPH NODE DISSECTION Left 12/04/2020   Procedure: BILATERAL MASTECTOMIES, RADIOACTIVE SEED GUIDED TARGETED LEFT AXILLARY NODE DISSECTION;  Surgeon: Vernetta Berg, MD;  Location: [facility];  Service: General;  Laterality: Left;   PORT-A-CATH REMOVAL Left 08/27/2021   Procedure: REMOVAL PORT-A-CATH;  Surgeon: Vernetta Berg, MD;  Location: [facility];  Service: General;  Laterality: Left;   PORTACATH PLACEMENT Left 12/04/2020   Procedure: INSERTION PORT-A-CATH LEFT SUBCLAVIAN;  Surgeon: Vernetta Berg, MD;  Location: [facility];  Service: General;  Laterality: Left;   REMOVAL OF TISSUE EXPANDER AND PLACEMENT OF IMPLANT Right 01/04/2021   Procedure: REMOVAL OF TISSUE EXPANDER AND PLACEMENT OF NEW EXPANDER;  Surgeon: Lowery Estefana RAMAN, DO;  Location: [facility];  Service: Plastics;  Laterality: Right;   REMOVAL OF TISSUE EXPANDER AND PLACEMENT OF IMPLANT Left 09/28/2022   Procedure: REMOVAL OF TISSUE EXPANDER AND REPLACEMENT OF EXPANDER;  Surgeon: Lowery Estefana RAMAN, DO;  Location: [facility];   Service: Plastics;  Laterality: Left;   REMOVAL OF TISSUE EXPANDER AND PLACEMENT OF IMPLANT Bilateral 02/24/2023   Procedure: REMOVAL OF TISSUE EXPANDER AND PLACEMENT OF IMPLANT;  Surgeon: Lowery Estefana RAMAN, DO;  Location: [facility];  Service: Plastics;  Laterality: Bilateral;   TISSUE EXPANDER PLACEMENT Right 03/19/2021   Procedure: Removal of right tissue expander;  Surgeon: Lowery Estefana RAMAN, DO;  Location: [facility];  Service: Plastics;  Laterality: Right;  45 min    MEDICATIONS: Current Outpatient Medications on File Prior to Visit  Medication Sig Dispense Refill   albuterol  (PROVENTIL ) (2.5 MG/3ML) 0.083% nebulizer solution Take 3 mLs (2.5 mg total) by nebulization every 6 (six) hours as needed for wheezing or shortness of breath. 75 mL 1   albuterol  (VENTOLIN  HFA) 108 (90 Base) MCG/ACT inhaler Inhale 2 puffs into the lungs every 4-6 hours as needed. 6.7 g 0   amoxicillin -clavulanate (AUGMENTIN ) 875-125 MG tablet Take 1 tablet by mouth every 12 (twelve) hours. 14 tablet 0   budesonide -formoterol  (SYMBICORT ) 160-4.5 MCG/ACT inhaler Inhale 2 puffs into the lungs 2 (two) times daily. 10.2 g 2   busPIRone  (BUSPAR ) 10 MG tablet Take 1 tablet (10 mg total) by mouth 2 (two) times daily. 180 tablet 1   cetirizine (ZYRTEC) 10 MG tablet Take 10 mg by mouth daily.     Cholecalciferol  (VITAMIN D ) 50 MCG (2000 UT) tablet Take 1 tablet (2,000 Units total) by mouth daily.     diphenhydrAMINE  (BENADRYL ) 25 MG tablet Take 50 mg by mouth every  6 (six) hours as needed (hives).     EPINEPHrine  0.3 mg/0.3 mL IJ SOAJ injection Inject 0.3 mg into the muscle as directed, in case of an anaphylaxis reaction. 2 each 1   EPINEPHrine  0.3 mg/0.3 mL IJ SOAJ injection Inject 0.3 mg into the skin as directed for anaphylaxis. 2 each 1   estradiol  (ESTRACE ) 0.1 MG/GM vaginal cream Place 0.5 g vaginally 2 (two) times a week. 42.5 g 3   fluticasone  (FLONASE ) 50 MCG/ACT nasal spray Place 2 sprays into both  nostrils daily. 16 g 0   gabapentin  (NEURONTIN ) 100 MG capsule Take 1 capsule (100 mg total) by mouth 3 (three) times daily. 270 capsule 0   HYDROcodone  bit-homatropine (HYCODAN) 5-1.5 MG/5ML syrup Take 5 mLs by mouth every 6 (six) hours as needed. 140 mL 0   ibuprofen  (ADVIL ) 200 MG tablet Take 400 mg by mouth every 6 (six) hours as needed for moderate pain.     melatonin 5 MG TABS Take 5 mg by mouth at bedtime as needed (sleep).     montelukast  (SINGULAIR ) 10 MG tablet Take 1 tablet (10 mg total) by mouth every evening. 30 tablet 3   Multiple Vitamin (MULTI VITAMIN) TABS 1 tablet Orally Once a day     Olopatadine  HCl 0.6 % SOLN Place 2 sprays into both nostrils 2 (two) times daily. 30.5 g 5   Probiotic Product ([employer]S PROBIOTIC PO) Take 1 capsule by mouth daily.     rosuvastatin  (CRESTOR ) 5 MG tablet Take 1 tablet (5 mg total) by mouth daily. 90 tablet 5   tamoxifen  (NOLVADEX ) 10 MG tablet Take 1 tablet (10 mg total) by mouth daily. 90 tablet 3   tirzepatide  (MOUNJARO ) 10 MG/0.5ML Pen Inject 10 mg into the skin once a week. 2 mL 0   tirzepatide  (MOUNJARO ) 10 MG/0.5ML Pen Inject 10 mg into the skin once a week. 2 mL 0   tirzepatide  (MOUNJARO ) 7.5 MG/0.5ML Pen Inject 7.5 mg into the skin once a week. 2 mL 3   venlafaxine  XR (EFFEXOR -XR) 150 MG 24 hr capsule Take 1 capsule (150 mg total) by mouth daily with breakfast. 90 capsule 0   [DISCONTINUED] Investigational olanzapine /placebo 10 MG capsule URCC 16070 Blister Card 2 Take 1 capsule by mouth daily. Take in the morning on Days 2-4. 3 capsule 0   No current facility-administered medications on file prior to visit.    ALLERGIES: Allergies  Allergen Reactions   Bee Venom Anaphylaxis and Hives    wheezy   Dog Epithelium (Canis Lupus Familiaris) Shortness Of Breath   Dust Mite Extract Other (See Comments)    sneezing   Grass Pollen(K-O-R-T-Swt Vern) Other (See Comments)    sneezing   Other Itching    environmental allergies sometime activate  asthma   Pollen Extract Other (See Comments)    sneezing    FAMILY HISTORY: Family History  Problem Relation Age of Onset   Cancer Mother    Thyroid  disease Mother    Hyperlipidemia Mother    Hypertension Mother    Fibromyalgia Mother    Colon cancer Mother 49   Depression Mother    Anxiety disorder Mother    Obesity Mother    Asthma Sister        x2   Hypertension Maternal Grandmother    Hypertension Maternal Grandfather    Cancer - Lung Maternal Grandfather    Stroke Maternal Apolinar Burkes Parkinson White syndrome Maternal Aunt 43   Colon cancer Other  MGM's niece, dx unknown age   Colon cancer Other        MGM's nephew; dx unknown age   Breast cancer Neg Hx     Objective:  Blood pressure 132/84, pulse (!) 109, height 4' 11 (1.499 m), weight 127 lb (57.6 kg), last menstrual period 12/10/2020, SpO2 96%. General: No acute distress.  Patient appears well-groomed.   Head:  Normocephalic/atraumatic, tenderness to left TMJ Eyes:  fundi examined but not visualized Neck: supple, no paraspinal tenderness, full range of motion Heart: regular rate and rhythm Neurological Exam: Mental status: alert and oriented to person, place, and time, speech fluent and not dysarthric, language intact. Cranial nerves: CN I: not tested CN II: pupils equal, round and reactive to light, visual fields intact CN III, IV, VI:  full range of motion, no nystagmus, no ptosis CN V: facial sensation intact. CN VII: upper and lower face symmetric CN VIII: hearing intact CN IX, X: gag intact, uvula midline CN XI: sternocleidomastoid and trapezius muscles intact CN XII: tongue midline Bulk & Tone: normal, no fasciculations. Motor:  muscle strength 5/5 throughout Sensation:  Pinprick and vibratory sensation intact. Deep Tendon Reflexes:  2+ throughout,  toes downgoing.   Finger to nose testing:  Without dysmetria.   Gait:  Normal station and stride.  Romberg negative.    Thank you  for allowing me to take part in the care of this patient.  Juliene Dunnings, DO  CC: Ardell Manly, MD

## 2024-08-16 ENCOUNTER — Ambulatory Visit: Admitting: Neurology

## 2024-08-16 ENCOUNTER — Encounter: Payer: Self-pay | Admitting: Neurology

## 2024-08-16 VITALS — BP 132/84 | HR 109 | Ht 59.0 in | Wt 127.0 lb

## 2024-08-16 DIAGNOSIS — G44019 Episodic cluster headache, not intractable: Secondary | ICD-10-CM | POA: Diagnosis not present

## 2024-08-17 DIAGNOSIS — Z8639 Personal history of other endocrine, nutritional and metabolic disease: Secondary | ICD-10-CM | POA: Diagnosis not present

## 2024-08-17 DIAGNOSIS — R5383 Other fatigue: Secondary | ICD-10-CM | POA: Diagnosis not present

## 2024-08-17 DIAGNOSIS — Z6823 Body mass index (BMI) 23.0-23.9, adult: Secondary | ICD-10-CM | POA: Diagnosis not present

## 2024-08-17 DIAGNOSIS — E782 Mixed hyperlipidemia: Secondary | ICD-10-CM | POA: Diagnosis not present

## 2024-08-17 DIAGNOSIS — E1169 Type 2 diabetes mellitus with other specified complication: Secondary | ICD-10-CM | POA: Diagnosis not present

## 2024-08-18 ENCOUNTER — Other Ambulatory Visit (HOSPITAL_COMMUNITY): Payer: Self-pay

## 2024-08-18 MED ORDER — MOUNJARO 7.5 MG/0.5ML ~~LOC~~ SOAJ
7.5000 mg | SUBCUTANEOUS | 0 refills | Status: AC
  Filled 2024-08-18 – 2024-08-30 (×4): qty 2, 28d supply, fill #0

## 2024-08-21 ENCOUNTER — Other Ambulatory Visit: Payer: Self-pay | Admitting: Physician Assistant

## 2024-08-22 ENCOUNTER — Other Ambulatory Visit (HOSPITAL_COMMUNITY): Payer: Self-pay

## 2024-08-22 MED ORDER — FLUZONE 0.5 ML IM SUSY
0.5000 mL | PREFILLED_SYRINGE | Freq: Once | INTRAMUSCULAR | 0 refills | Status: AC
  Filled 2024-08-22: qty 0.5, 1d supply, fill #0

## 2024-08-23 ENCOUNTER — Other Ambulatory Visit (HOSPITAL_COMMUNITY): Payer: Self-pay

## 2024-08-23 ENCOUNTER — Other Ambulatory Visit: Payer: Self-pay

## 2024-08-23 MED ORDER — GABAPENTIN 100 MG PO CAPS
100.0000 mg | ORAL_CAPSULE | Freq: Three times a day (TID) | ORAL | 0 refills | Status: AC
  Filled 2024-08-23 – 2024-11-06 (×2): qty 270, 90d supply, fill #0

## 2024-08-28 ENCOUNTER — Other Ambulatory Visit (HOSPITAL_COMMUNITY): Payer: Self-pay

## 2024-08-28 DIAGNOSIS — F4312 Post-traumatic stress disorder, chronic: Secondary | ICD-10-CM | POA: Diagnosis not present

## 2024-08-30 ENCOUNTER — Other Ambulatory Visit (HOSPITAL_COMMUNITY): Payer: Self-pay

## 2024-08-31 ENCOUNTER — Ambulatory Visit: Admitting: Neurology

## 2024-09-01 ENCOUNTER — Ambulatory Visit: Admitting: Physician Assistant

## 2024-09-05 ENCOUNTER — Other Ambulatory Visit (HOSPITAL_COMMUNITY): Payer: Self-pay

## 2024-09-05 DIAGNOSIS — F4312 Post-traumatic stress disorder, chronic: Secondary | ICD-10-CM | POA: Diagnosis not present

## 2024-09-11 DIAGNOSIS — F4312 Post-traumatic stress disorder, chronic: Secondary | ICD-10-CM | POA: Diagnosis not present

## 2024-09-18 ENCOUNTER — Other Ambulatory Visit (HOSPITAL_COMMUNITY): Payer: Self-pay

## 2024-09-18 DIAGNOSIS — L853 Xerosis cutis: Secondary | ICD-10-CM | POA: Diagnosis not present

## 2024-09-18 DIAGNOSIS — F4312 Post-traumatic stress disorder, chronic: Secondary | ICD-10-CM | POA: Diagnosis not present

## 2024-09-18 MED ORDER — TRIAMCINOLONE ACETONIDE 0.1 % EX CREA
1.0000 | TOPICAL_CREAM | Freq: Every day | CUTANEOUS | 5 refills | Status: AC | PRN
  Filled 2024-09-18: qty 15, 30d supply, fill #0

## 2024-09-26 ENCOUNTER — Ambulatory Visit: Admitting: Neurology

## 2024-09-27 DIAGNOSIS — Z6823 Body mass index (BMI) 23.0-23.9, adult: Secondary | ICD-10-CM | POA: Diagnosis not present

## 2024-09-27 DIAGNOSIS — E1169 Type 2 diabetes mellitus with other specified complication: Secondary | ICD-10-CM | POA: Diagnosis not present

## 2024-09-27 DIAGNOSIS — Z8639 Personal history of other endocrine, nutritional and metabolic disease: Secondary | ICD-10-CM | POA: Diagnosis not present

## 2024-09-27 DIAGNOSIS — R5383 Other fatigue: Secondary | ICD-10-CM | POA: Diagnosis not present

## 2024-09-27 DIAGNOSIS — E782 Mixed hyperlipidemia: Secondary | ICD-10-CM | POA: Diagnosis not present

## 2024-09-28 ENCOUNTER — Other Ambulatory Visit (HOSPITAL_COMMUNITY): Payer: Self-pay

## 2024-09-28 MED ORDER — MOUNJARO 7.5 MG/0.5ML ~~LOC~~ SOAJ
7.5000 mg | SUBCUTANEOUS | 1 refills | Status: AC
  Filled 2024-09-28 – 2024-10-20 (×2): qty 2, 28d supply, fill #0

## 2024-10-03 ENCOUNTER — Ambulatory Visit: Admitting: Neurology

## 2024-10-03 DIAGNOSIS — F4312 Post-traumatic stress disorder, chronic: Secondary | ICD-10-CM | POA: Diagnosis not present

## 2024-10-11 ENCOUNTER — Other Ambulatory Visit (HOSPITAL_COMMUNITY): Payer: Self-pay

## 2024-10-13 DIAGNOSIS — F4312 Post-traumatic stress disorder, chronic: Secondary | ICD-10-CM | POA: Diagnosis not present

## 2024-10-16 ENCOUNTER — Ambulatory Visit (INDEPENDENT_AMBULATORY_CARE_PROVIDER_SITE_OTHER)

## 2024-10-16 ENCOUNTER — Encounter (HOSPITAL_BASED_OUTPATIENT_CLINIC_OR_DEPARTMENT_OTHER): Payer: Self-pay | Admitting: Pulmonary Disease

## 2024-10-16 ENCOUNTER — Ambulatory Visit (HOSPITAL_BASED_OUTPATIENT_CLINIC_OR_DEPARTMENT_OTHER): Admitting: Pulmonary Disease

## 2024-10-16 VITALS — BP 133/83 | HR 93 | Ht 59.0 in | Wt 129.0 lb

## 2024-10-16 DIAGNOSIS — J4530 Mild persistent asthma, uncomplicated: Secondary | ICD-10-CM

## 2024-10-16 DIAGNOSIS — F17210 Nicotine dependence, cigarettes, uncomplicated: Secondary | ICD-10-CM | POA: Diagnosis not present

## 2024-10-16 LAB — PULMONARY FUNCTION TEST
FEF 25-75 Post: 4.2 L/s
FEF 25-75 Pre: 3.86 L/s
FEF2575-%Change-Post: 8 %
FEF2575-%Pred-Post: 161 %
FEF2575-%Pred-Pre: 148 %
FEV1-%Change-Post: 2 %
FEV1-%Pred-Post: 128 %
FEV1-%Pred-Pre: 125 %
FEV1-Post: 3.1 L
FEV1-Pre: 3.03 L
FEV1FVC-%Change-Post: 0 %
FEV1FVC-%Pred-Pre: 104 %
FEV6-%Change-Post: 2 %
FEV6-%Pred-Post: 123 %
FEV6-%Pred-Pre: 121 %
FEV6-Post: 3.63 L
FEV6-Pre: 3.56 L
FEV6FVC-%Pred-Post: 102 %
FEV6FVC-%Pred-Pre: 102 %
FVC-%Change-Post: 1 %
FVC-%Pred-Post: 120 %
FVC-%Pred-Pre: 118 %
FVC-Post: 3.64 L
FVC-Pre: 3.58 L
Post FEV1/FVC ratio: 85 %
Post FEV6/FVC ratio: 100 %
Pre FEV1/FVC ratio: 85 %
Pre FEV6/FVC Ratio: 100 %

## 2024-10-16 NOTE — Progress Notes (Signed)
Pre/Post Spirometry performed today.

## 2024-10-16 NOTE — Patient Instructions (Signed)
Pre/Post Spirometry performed today.

## 2024-10-16 NOTE — Patient Instructions (Addendum)
 Mild persistent asthma --CONTINUE brand name Symbicort  160-4.5 mcg TWO puffs in the morning and evening --CONTINUE Albuterol  every 4-6 hours AS NEEDED --ORDER pulmonary function test. Will message results --ORDER CBC with diff and IgE --Review Allergy records prior to next visit in CareEverywhere --Consider biologics in the future if symptoms persistent

## 2024-10-16 NOTE — Progress Notes (Unsigned)
 Subjective:   PATIENT ID: Jennifer Davis GENDER: female DOB: 1976/01/23, MRN: [id number]  Chief Complaint  Patient presents with   Establish Care    Reason for Visit: New consult for asthma  Ms. Durinda Buzzelli is a 48 year old female active smoker with breast cancer s/p bilateral mastectomy and chemoradiation  Previously seen by Dr. Cheryn at [facility]. Previously was on allergy injections but stopped due to possible migraines. Recently developed asthma. Currently she is on Symbicort  and has been on it for 10 years. Previously on Advair Diskus when she lived in [place] . On zyrtec all year and singulair  daily. Asthma is triggered illness and allergies or when cleaning the house. Usually worsens with changes in seasons into the fall or into the spring. Overall asthma well controlled currently. Previously she was treated for exacerbation once a year and will manifest as productive cough, wheezing and shortness of breath and hoarseness. Since her chemotherapy one year ago she feels her bronchitis and exacerbations symptoms linger longer for up to three months. Has had an episode of bronchitis once a day it seems or more difficult to recover.   Asthma Control Test ACT Total Score  10/16/2024  3:10 PM 24   Social History: Previously Technical sales engineer Active smoker. Previously social smoker and started 2-3 years ago. At most 1/4 ppd. Triggered by stress. Mom is currently hospitalized.  I have personally reviewed patient's past medical/family/social history, allergies, current medications.  Past Medical History:  Diagnosis Date   Anxiety    Asthma    Asthma due to seasonal allergies    Back pain    Breast cancer (HCC)    Constipation    Depression    Diabetes mellitus without complication (HCC)    Dyspnea    after chemo - fatiqued   Fluid retention    GERD (gastroesophageal reflux disease)    occasionl   History of radiation therapy    left chest wall and subclavian  07/09/2021-08/22/2021   Dr Lynwood Nasuti   Joint pain    Lower extremity edema    Morbid obesity (HCC)    Palpitations 2017   tachy    Palpitations 01/09/2016   Prediabetes    PTSD (post-traumatic stress disorder)    SOB (shortness of breath)    Swallowing difficulty      Family History  Problem Relation Age of Onset   Cancer Mother    Thyroid  disease Mother    Hyperlipidemia Mother    Hypertension Mother    Fibromyalgia Mother    Colon cancer Mother 60   Depression Mother    Anxiety disorder Mother    Obesity Mother    Asthma Sister        x2   Hypertension Maternal Grandmother    Hypertension Maternal Grandfather    Cancer - Lung Maternal Grandfather    Stroke Maternal Grandfather    Insurance Account Manager Parkinson White syndrome Maternal Aunt 57   Colon cancer Other        MGM's niece, dx unknown age   Colon cancer Other        MGM's nephew; dx unknown age   Breast cancer Neg Hx      Social History   Occupational History   Occupation: nursing unit clerk    Employer: [employer]  Tobacco Use   Smoking status: Some Days    Current packs/day: 2.00    Types: Cigarettes    Passive exposure: Current   Smokeless  tobacco: Never  Vaping Use   Vaping status: Never Used  Substance and Sexual Activity   Alcohol use: Not Currently    Comment: 1-2 per month   Drug use: Never   Sexual activity: Yes    Allergies  Allergen Reactions   Bee Venom Anaphylaxis and Hives    wheezy   Dog Epithelium (Canis Lupus Familiaris) Shortness Of Breath   Dust Mite Extract Other (See Comments)    sneezing   Grass Pollen(K-O-R-T-Swt Vern) Other (See Comments)    sneezing   Other Itching    environmental allergies sometime activate asthma   Pollen Extract Other (See Comments)    sneezing     Outpatient Medications Prior to Visit  Medication Sig Dispense Refill   albuterol  (PROVENTIL ) (2.5 MG/3ML) 0.083% nebulizer solution Take 3 mLs (2.5 mg total) by nebulization every 6 (six) hours as needed  for wheezing or shortness of breath. 75 mL 1   albuterol  (VENTOLIN  HFA) 108 (90 Base) MCG/ACT inhaler Inhale 2 puffs into the lungs every 4-6 hours as needed. 6.7 g 0   budesonide -formoterol  (SYMBICORT ) 160-4.5 MCG/ACT inhaler Inhale 2 puffs into the lungs 2 (two) times daily. 10.2 g 2   busPIRone  (BUSPAR ) 10 MG tablet Take 1 tablet (10 mg total) by mouth 2 (two) times daily. 180 tablet 1   cetirizine (ZYRTEC) 10 MG tablet Take 10 mg by mouth daily.     Cholecalciferol  (VITAMIN D ) 50 MCG (2000 UT) tablet Take 1 tablet (2,000 Units total) by mouth daily.     diphenhydrAMINE  (BENADRYL ) 25 MG tablet Take 50 mg by mouth every 6 (six) hours as needed (hives).     EPINEPHrine  0.3 mg/0.3 mL IJ SOAJ injection Inject 0.3 mg into the muscle as directed, in case of an anaphylaxis reaction. 2 each 1   EPINEPHrine  0.3 mg/0.3 mL IJ SOAJ injection Inject 0.3 mg into the skin as directed for anaphylaxis. 2 each 1   estradiol  (ESTRACE ) 0.1 MG/GM vaginal cream Place 0.5 g vaginally 2 (two) times a week. 42.5 g 3   fluticasone  (FLONASE ) 50 MCG/ACT nasal spray Place 2 sprays into both nostrils daily. 16 g 0   gabapentin  (NEURONTIN ) 100 MG capsule Take 1 capsule (100 mg total) by mouth 3 (three) times daily. 270 capsule 0   ibuprofen  (ADVIL ) 200 MG tablet Take 400 mg by mouth every 6 (six) hours as needed for moderate pain.     melatonin 5 MG TABS Take 5 mg by mouth at bedtime as needed (sleep).     montelukast  (SINGULAIR ) 10 MG tablet Take 1 tablet (10 mg total) by mouth every evening. 30 tablet 3   Multiple Vitamin (MULTI VITAMIN) TABS 1 tablet Orally Once a day     Probiotic Product (CVS PROBIOTIC PO) Take 1 capsule by mouth daily.     rosuvastatin  (CRESTOR ) 5 MG tablet Take 1 tablet (5 mg total) by mouth daily. 90 tablet 5   tamoxifen  (NOLVADEX ) 10 MG tablet Take 1 tablet (10 mg total) by mouth daily. 90 tablet 3   tirzepatide  (MOUNJARO ) 10 MG/0.5ML Pen Inject 10 mg into the skin once a week. 2 mL 0   tirzepatide   (MOUNJARO ) 10 MG/0.5ML Pen Inject 10 mg into the skin once a week. 2 mL 0   tirzepatide  (MOUNJARO ) 7.5 MG/0.5ML Pen Inject 7.5 mg into the skin once a week. 2 mL 3   tirzepatide  (MOUNJARO ) 7.5 MG/0.5ML Pen Inject 7.5 mg into the skin once a week. 2 mL 0  tirzepatide  (MOUNJARO ) 7.5 MG/0.5ML Pen Inject 7.5 mg into the skin once a week. 2 mL 1   triamcinolone  cream (KENALOG ) 0.1 % Apply 1 Application topically daily as needed. 15 g 5   venlafaxine  XR (EFFEXOR -XR) 150 MG 24 hr capsule Take 1 capsule (150 mg total) by mouth daily with breakfast. 90 capsule 0   No facility-administered medications prior to visit.    Review of Systems  Constitutional:  Negative for chills, diaphoresis, fever, malaise/fatigue and weight loss.  HENT:  Negative for congestion.   Respiratory:  Negative for cough, hemoptysis, sputum production, shortness of breath and wheezing.   Cardiovascular:  Negative for chest pain, palpitations and leg swelling.     Objective:   Vitals:   10/16/24 1441  BP: 133/83  Pulse: 93  SpO2: 96%  Weight: 129 lb (58.5 kg)  Height: 4' 11 (1.499 m)   SpO2: 96 %  Physical Exam: General: Well-appearing, no acute distress HENT: [place], AT Eyes: EOMI, no scleral icterus Respiratory: Clear to auscultation bilaterally.  No crackles, wheezing or rales Cardiovascular: RRR, -M/R/G, no JVD Extremities:-Edema,-tenderness Neuro: AAO x4, CNII-XII grossly intact Psych: Normal mood, normal affect  Data Reviewed:  Imaging: CTA 10/09/22 - No PE. No parenchymal abnormalities CXR 10/27/23 - Streaky RML and lingular infiltrates  PFT: None on file  Labs: CBC    Component Value Date/Time   WBC 10.4 06/09/2023 1845   RBC 4.71 06/09/2023 1845   HGB 13.9 06/09/2023 1845   HGB 12.9 12/03/2021 0900   HCT 42.0 06/09/2023 1845   HCT 41.5 12/03/2021 0900   PLT 374 06/09/2023 1845   PLT 328 12/03/2021 0900   MCV 89.2 06/09/2023 1845   MCV 85 12/03/2021 0900   MCH 29.5 06/09/2023 1845    MCHC 33.1 06/09/2023 1845   RDW 12.7 06/09/2023 1845   RDW 16.5 (H) 12/03/2021 0900   LYMPHSABS 3.8 06/09/2023 1845   LYMPHSABS 1.3 12/03/2021 0900   MONOABS 0.7 06/09/2023 1845   EOSABS 0.2 06/09/2023 1845   EOSABS 0.4 12/03/2021 0900   BASOSABS 0.1 06/09/2023 1845   BASOSABS 0.0 12/03/2021 0900    Assessment & Plan:   Discussion: HPI  Mild persistent asthma --CONTINUE brand name Symbicort  160-4.5 mcg TWO puffs in the morning and evening --CONTINUE Albuterol  every 4-6 hours AS NEEDED --ORDER pulmonary function test. Will message results --ORDER CBC with diff and IgE --Review Allergy records prior to next visit in CareEverywhere --Consider biologics in the future if symptoms persistent  Health Maintenance Immunization History  Administered Date(s) Administered   Influenza Inj Mdck Quad Pf 07/31/2019   Influenza Split 08/30/2013, 08/13/2017   Influenza, Seasonal, Injecte, Preservative Fre 08/22/2024   Influenza,inj,Quad PF,6+ Mos 08/26/2016, 08/18/2018   Influenza-Unspecified 07/31/2021   PFIZER(Purple Top)SARS-COV-2 Vaccination 10/30/2019, 11/20/2019   Tdap 12/12/2013   CT Lung Screen - not qualified  Orders Placed This Encounter  Procedures   CBC with Differential   IgE   Pulmonary function test    Standing Status:   Future    Expiration Date:   10/16/2025    Where should this test be performed?:   Outpatient Pulmonary    What type of PFT is being ordered?:   Full PFT  No orders of the defined types were placed in this encounter.   No follow-ups on file.  I have spent a total time of 45-minutes on the day of the appointment reviewing prior documentation, coordinating care and discussing medical diagnosis and plan with the patient/family. Imaging, labs and  tests included in this note have been reviewed and interpreted independently by me.  Maryella Abood Slater Staff, MD [facility] 10/16/2024 3:20 PM

## 2024-10-17 ENCOUNTER — Ambulatory Visit (HOSPITAL_BASED_OUTPATIENT_CLINIC_OR_DEPARTMENT_OTHER): Payer: Self-pay | Admitting: Pulmonary Disease

## 2024-10-18 LAB — CBC WITH DIFFERENTIAL/PLATELET
Basophils Absolute: 0 x10E3/uL (ref 0.0–0.2)
Basos: 0 %
EOS (ABSOLUTE): 0.1 x10E3/uL (ref 0.0–0.4)
Eos: 1 %
Hematocrit: 40 % (ref 34.0–46.6)
Hemoglobin: 12.2 g/dL (ref 11.1–15.9)
Immature Grans (Abs): 0 x10E3/uL (ref 0.0–0.1)
Immature Granulocytes: 0 %
Lymphocytes Absolute: 2.8 x10E3/uL (ref 0.7–3.1)
Lymphs: 40 %
MCH: 29 pg (ref 26.6–33.0)
MCHC: 30.5 g/dL — ABNORMAL LOW (ref 31.5–35.7)
MCV: 95 fL (ref 79–97)
Monocytes Absolute: 0.4 x10E3/uL (ref 0.1–0.9)
Monocytes: 6 %
Neutrophils Absolute: 3.6 x10E3/uL (ref 1.4–7.0)
Neutrophils: 53 %
Platelets: 272 x10E3/uL (ref 150–450)
RBC: 4.21 x10E6/uL (ref 3.77–5.28)
RDW: 12 % (ref 11.7–15.4)
WBC: 6.9 x10E3/uL (ref 3.4–10.8)

## 2024-10-18 LAB — IGE: IgE (Immunoglobulin E), Serum: 628 [IU]/mL — ABNORMAL HIGH (ref 6–495)

## 2024-10-20 ENCOUNTER — Other Ambulatory Visit (HOSPITAL_COMMUNITY): Payer: Self-pay

## 2024-10-24 DIAGNOSIS — F4312 Post-traumatic stress disorder, chronic: Secondary | ICD-10-CM | POA: Diagnosis not present

## 2024-10-30 DIAGNOSIS — F4312 Post-traumatic stress disorder, chronic: Secondary | ICD-10-CM | POA: Diagnosis not present

## 2024-11-06 ENCOUNTER — Other Ambulatory Visit: Payer: Self-pay | Admitting: Physician Assistant

## 2024-11-06 ENCOUNTER — Other Ambulatory Visit: Payer: Self-pay

## 2024-11-06 ENCOUNTER — Other Ambulatory Visit (HOSPITAL_COMMUNITY): Payer: Self-pay

## 2024-11-06 MED ORDER — BUDESONIDE-FORMOTEROL FUMARATE 160-4.5 MCG/ACT IN AERO
2.0000 | INHALATION_SPRAY | Freq: Two times a day (BID) | RESPIRATORY_TRACT | 2 refills | Status: AC
  Filled 2024-11-06 – 2024-11-07 (×2): qty 10.2, 30d supply, fill #0

## 2024-11-06 MED ORDER — VENLAFAXINE HCL ER 150 MG PO CP24
150.0000 mg | ORAL_CAPSULE | Freq: Every day | ORAL | 0 refills | Status: AC
  Filled 2024-11-06: qty 90, 90d supply, fill #0

## 2024-11-07 ENCOUNTER — Other Ambulatory Visit (HOSPITAL_COMMUNITY): Payer: Self-pay

## 2024-11-07 ENCOUNTER — Other Ambulatory Visit: Payer: Self-pay

## 2024-11-13 ENCOUNTER — Other Ambulatory Visit (HOSPITAL_COMMUNITY): Payer: Self-pay

## 2024-11-15 ENCOUNTER — Other Ambulatory Visit (HOSPITAL_COMMUNITY): Payer: Self-pay

## 2024-11-15 MED ORDER — MOUNJARO 7.5 MG/0.5ML ~~LOC~~ SOAJ
7.5000 mg | SUBCUTANEOUS | 1 refills | Status: AC
  Filled 2024-11-15: qty 2, 28d supply, fill #0

## 2024-11-24 ENCOUNTER — Other Ambulatory Visit (HOSPITAL_COMMUNITY): Payer: Self-pay

## 2024-11-24 ENCOUNTER — Ambulatory Visit: Admitting: Physician Assistant

## 2024-11-24 ENCOUNTER — Encounter (HOSPITAL_COMMUNITY): Payer: Self-pay | Admitting: Pharmacist

## 2024-11-24 ENCOUNTER — Encounter: Payer: Self-pay | Admitting: Physician Assistant

## 2024-11-24 DIAGNOSIS — F17200 Nicotine dependence, unspecified, uncomplicated: Secondary | ICD-10-CM

## 2024-11-24 DIAGNOSIS — F4321 Adjustment disorder with depressed mood: Secondary | ICD-10-CM

## 2024-11-24 DIAGNOSIS — F4323 Adjustment disorder with mixed anxiety and depressed mood: Secondary | ICD-10-CM

## 2024-11-24 MED ORDER — VENLAFAXINE HCL ER 75 MG PO CP24
75.0000 mg | ORAL_CAPSULE | Freq: Every day | ORAL | 0 refills | Status: AC
  Filled 2024-11-24: qty 90, 90d supply, fill #0

## 2024-11-24 MED ORDER — BUSPIRONE HCL 10 MG PO TABS
10.0000 mg | ORAL_TABLET | Freq: Two times a day (BID) | ORAL | 0 refills | Status: AC
  Filled 2024-11-24: qty 180, 90d supply, fill #0

## 2024-11-24 NOTE — Progress Notes (Signed)
 "     [facility]  Patient ID: Jennifer Davis,  MRN: [id number]  PCP: Ransom Other, MD  Date of Evaluation: 11/24/2024 Time spent:20 minutes   Chief Complaint:  Chief Complaint   Anxiety; Depression; Follow-up    HISTORY/CURRENT STATUS: HPI  3 mo overdue for appt.  15 min late for this appt.  Her Mom died since LOV. Had St IV cancer. Her GM has been in and out of the hospital.  So that has been hard to.  She is still having marital problems as well.  She is able to work.  She stays tired a lot and does not really want to do things that she normally enjoyed.  She sometimes has trouble sleeping.  No panic attacks but does have more generalized anxiety.  No extreme sadness or feelings of hopelessness.  No suicidal or homicidal thoughts.  No reports of increased energy with decreased need for sleep, increased talkativeness, racing thoughts, impulsivity or risky behaviors, increased spending, increased libido, grandiosity, increased irritability or anger, paranoia, or hallucinations.  Individual Medical History/ Review of Systems: Changes? :No    Past medications for mental health diagnoses include: Lexapro , Prozac wasn't effective, Xanax, Buspar  worked but d/c when didn't need it, Cymbalta , Effexor   Allergies: Bee venom, Dog epithelium (canis lupus familiaris), Dust mite extract, Grass pollen([id number] vern), Other, and Pollen extract  Current Medications:  Current Outpatient Medications:    albuterol  (PROVENTIL ) (2.5 MG/3ML) 0.083% nebulizer solution, Take 3 mLs (2.5 mg total) by nebulization every 6 (six) hours as needed for wheezing or shortness of breath., Disp: 75 mL, Rfl: 1   albuterol  (VENTOLIN  HFA) 108 (90 Base) MCG/ACT inhaler, Inhale 2 puffs into the lungs every 4-6 hours as needed., Disp: 6.7 g, Rfl: 0   budesonide -formoterol  (SYMBICORT ) 160-4.5 MCG/ACT inhaler, Inhale 2 puffs into the lungs 2 (two) times daily., Disp: 10.2 g, Rfl: 2   cetirizine (ZYRTEC) 10 MG  tablet, Take 10 mg by mouth daily., Disp: , Rfl:    Cholecalciferol  (VITAMIN D ) 50 MCG (2000 UT) tablet, Take 1 tablet (2,000 Units total) by mouth daily., Disp: , Rfl:    diphenhydrAMINE  (BENADRYL ) 25 MG tablet, Take 50 mg by mouth every 6 (six) hours as needed (hives)., Disp: , Rfl:    EPINEPHrine  0.3 mg/0.3 mL IJ SOAJ injection, Inject 0.3 mg into the muscle as directed, in case of an anaphylaxis reaction., Disp: 2 each, Rfl: 1   EPINEPHrine  0.3 mg/0.3 mL IJ SOAJ injection, Inject 0.3 mg into the skin as directed for anaphylaxis., Disp: 2 each, Rfl: 1   estradiol  (ESTRACE ) 0.1 MG/GM vaginal cream, Place 0.5 g vaginally 2 (two) times a week., Disp: 42.5 g, Rfl: 3   fluticasone  (FLONASE ) 50 MCG/ACT nasal spray, Place 2 sprays into both nostrils daily., Disp: 16 g, Rfl: 0   gabapentin  (NEURONTIN ) 100 MG capsule, Take 1 capsule (100 mg total) by mouth 3 (three) times daily., Disp: 270 capsule, Rfl: 0   ibuprofen  (ADVIL ) 200 MG tablet, Take 400 mg by mouth every 6 (six) hours as needed for moderate pain., Disp: , Rfl:    melatonin 5 MG TABS, Take 5 mg by mouth at bedtime as needed (sleep)., Disp: , Rfl:    montelukast  (SINGULAIR ) 10 MG tablet, Take 1 tablet (10 mg total) by mouth every evening., Disp: 30 tablet, Rfl: 3   Multiple Vitamin (MULTI VITAMIN) TABS, 1 tablet Orally Once a day, Disp: , Rfl:    Probiotic Product ([employer] PROBIOTIC PO), Take 1  capsule by mouth daily., Disp: , Rfl:    rosuvastatin  (CRESTOR ) 5 MG tablet, Take 1 tablet (5 mg total) by mouth daily., Disp: 90 tablet, Rfl: 5   tamoxifen  (NOLVADEX ) 10 MG tablet, Take 1 tablet (10 mg total) by mouth daily., Disp: 90 tablet, Rfl: 3   tirzepatide  (MOUNJARO ) 10 MG/0.5ML Pen, Inject 10 mg into the skin once a week., Disp: 2 mL, Rfl: 0   triamcinolone  cream (KENALOG ) 0.1 %, Apply 1 Application topically daily as needed., Disp: 15 g, Rfl: 5   venlafaxine  XR (EFFEXOR -XR) 150 MG 24 hr capsule, Take 1 capsule (150 mg total) by mouth daily with  breakfast., Disp: 90 capsule, Rfl: 0   venlafaxine  XR (EFFEXOR -XR) 75 MG 24 hr capsule, Take 1 capsule (75 mg total) by mouth daily with breakfast. Take with the 150 mg daily, Disp: 90 capsule, Rfl: 0   busPIRone  (BUSPAR ) 10 MG tablet, Take 1 tablet (10 mg total) by mouth 2 (two) times daily., Disp: 180 tablet, Rfl: 0   tirzepatide  (MOUNJARO ) 10 MG/0.5ML Pen, Inject 10 mg into the skin once a week., Disp: 2 mL, Rfl: 0   tirzepatide  (MOUNJARO ) 7.5 MG/0.5ML Pen, Inject 7.5 mg into the skin once a week., Disp: 2 mL, Rfl: 3   tirzepatide  (MOUNJARO ) 7.5 MG/0.5ML Pen, Inject 7.5 mg into the skin once a week., Disp: 2 mL, Rfl: 0   tirzepatide  (MOUNJARO ) 7.5 MG/0.5ML Pen, Inject 7.5 mg into the skin once a week., Disp: 2 mL, Rfl: 1   tirzepatide  (MOUNJARO ) 7.5 MG/0.5ML Pen, Inject 7.5 mg into the skin once a week., Disp: 2 mL, Rfl: 1 Medication Side Effects: none  Family Medical/ Social History: Changes? See HPI  MENTAL HEALTH EXAM:  Last menstrual period 12/10/2020.There is no height or weight on file to calculate BMI.  General Appearance: Casual and Well Groomed  Eye Contact:  Good  Speech:  Clear and Coherent and Normal Rate  Volume:  Normal  Mood:  Sad  Affect:  Congruent  Thought Process:  Goal Directed and Descriptions of Associations: Circumstantial  Orientation:  Full (Time, Place, and Person)  Thought Content: Logical   Suicidal Thoughts:  No  Homicidal Thoughts:  No  Memory:  WNL  Judgement:  Good  Insight:  Good  Psychomotor Activity:  Normal  Concentration:  Concentration: Good and Attention Span: Good  Recall:  Good  Fund of Knowledge: Good  Language: Good  Assets:  Communication Skills Desire for Improvement Financial Resources/Insurance Housing Resilience Transportation Vocational/Educational  ADL's:  Intact  Cognition: WNL  Prognosis:  Good   DIAGNOSES:    ICD-10-CM   1. Situational mixed anxiety and depressive disorder  F43.23     2. Smoker  F17.200     3.  Grief  F43.21       Receiving Psychotherapy: Yes  [facility] at [facility]  RECOMMENDATIONS:  PDMP reviewed.  Gabapentin  filled 11/08/2023. I provided approximately 20 minutes of face to face time during this encounter, including time spent before and after the visit in records review, medical decision making, counseling pertinent to today's visit, and charting.   My condolences in the loss of her mom.  She is in counseling and will continue that.  I recommend increasing the Effexor .  That will help with the depression and anxiety.  However she understands that a lot of her feelings are situational.  Do her best to follow recommendations of counselor, healthy diet and exercise.  Continue Buspar  10 mg bid.  Continue gabapentin   100 mg, 1 p.o. 3 times daily as needed. Continue melatonin 5 mg nightly as needed sleep. Increase Effexor  XR 150 mg, 1 p.o. daily.  +75 mg daily. Continue therapy both individual and marriage counseling. Return in 6 weeks.  Verneita Cooks, PA-C  "

## 2024-11-28 ENCOUNTER — Other Ambulatory Visit (HOSPITAL_COMMUNITY): Payer: Self-pay

## 2024-11-28 ENCOUNTER — Encounter: Payer: Self-pay | Admitting: Pharmacist

## 2024-11-28 MED ORDER — ESTRADIOL 0.01 % VA CREA
0.5000 g | TOPICAL_CREAM | VAGINAL | 3 refills | Status: AC
  Filled 2024-11-28: qty 42.5, 90d supply, fill #0

## 2024-12-08 ENCOUNTER — Other Ambulatory Visit (HOSPITAL_COMMUNITY): Payer: Self-pay

## 2025-01-10 ENCOUNTER — Ambulatory Visit: Admitting: Physician Assistant

## 2025-06-11 ENCOUNTER — Ambulatory Visit: Admitting: Hematology and Oncology
